# Patient Record
Sex: Female | Born: 1945
Health system: Southern US, Community
[De-identification: ages and names within clinical notes are randomized; demographics above are authoritative.]

## PROBLEM LIST (undated history)

## (undated) DIAGNOSIS — J449 Chronic obstructive pulmonary disease, unspecified: Secondary | ICD-10-CM

## (undated) DIAGNOSIS — I10 Essential (primary) hypertension: Secondary | ICD-10-CM

## (undated) DIAGNOSIS — I5189 Other ill-defined heart diseases: Secondary | ICD-10-CM

## (undated) DIAGNOSIS — E785 Hyperlipidemia, unspecified: Secondary | ICD-10-CM

## (undated) HISTORY — DX: Essential (primary) hypertension: I10

## (undated) HISTORY — DX: Other ill-defined heart diseases: I51.89

## (undated) HISTORY — PX: TUBAL LIGATION: SHX77

## (undated) HISTORY — DX: Hyperlipidemia, unspecified: E78.5

---

## 1997-05-15 ENCOUNTER — Other Ambulatory Visit: Admission: RE | Admit: 1997-05-15 | Discharge: 1997-05-15 | Payer: Self-pay | Admitting: *Deleted

## 1998-08-04 ENCOUNTER — Other Ambulatory Visit: Admission: RE | Admit: 1998-08-04 | Discharge: 1998-08-04 | Payer: Self-pay | Admitting: *Deleted

## 1998-12-01 ENCOUNTER — Other Ambulatory Visit: Admission: RE | Admit: 1998-12-01 | Discharge: 1998-12-01 | Payer: Self-pay | Admitting: *Deleted

## 1998-12-01 ENCOUNTER — Encounter (INDEPENDENT_AMBULATORY_CARE_PROVIDER_SITE_OTHER): Payer: Self-pay | Admitting: Specialist

## 1999-03-02 ENCOUNTER — Encounter: Payer: Self-pay | Admitting: *Deleted

## 1999-03-02 ENCOUNTER — Encounter: Admission: RE | Admit: 1999-03-02 | Discharge: 1999-03-02 | Payer: Self-pay | Admitting: *Deleted

## 1999-03-04 ENCOUNTER — Ambulatory Visit (HOSPITAL_BASED_OUTPATIENT_CLINIC_OR_DEPARTMENT_OTHER): Admission: RE | Admit: 1999-03-04 | Discharge: 1999-03-04 | Payer: Self-pay | Admitting: *Deleted

## 1999-03-04 ENCOUNTER — Encounter (INDEPENDENT_AMBULATORY_CARE_PROVIDER_SITE_OTHER): Payer: Self-pay | Admitting: *Deleted

## 1999-07-29 ENCOUNTER — Other Ambulatory Visit: Admission: RE | Admit: 1999-07-29 | Discharge: 1999-07-29 | Payer: Self-pay | Admitting: *Deleted

## 1999-08-03 ENCOUNTER — Encounter: Payer: Self-pay | Admitting: *Deleted

## 1999-08-03 ENCOUNTER — Encounter: Admission: RE | Admit: 1999-08-03 | Discharge: 1999-08-03 | Payer: Self-pay | Admitting: *Deleted

## 2000-02-23 ENCOUNTER — Encounter: Payer: Self-pay | Admitting: Family Medicine

## 2000-02-23 ENCOUNTER — Encounter: Admission: RE | Admit: 2000-02-23 | Discharge: 2000-02-23 | Payer: Self-pay | Admitting: Family Medicine

## 2000-10-13 ENCOUNTER — Other Ambulatory Visit: Admission: RE | Admit: 2000-10-13 | Discharge: 2000-10-13 | Payer: Self-pay | Admitting: *Deleted

## 2002-05-30 ENCOUNTER — Other Ambulatory Visit: Admission: RE | Admit: 2002-05-30 | Discharge: 2002-05-30 | Payer: Self-pay | Admitting: *Deleted

## 2005-11-17 ENCOUNTER — Encounter: Admission: RE | Admit: 2005-11-17 | Discharge: 2005-11-17 | Payer: Self-pay | Admitting: Orthopaedic Surgery

## 2005-12-16 ENCOUNTER — Encounter: Admission: RE | Admit: 2005-12-16 | Discharge: 2005-12-16 | Payer: Self-pay | Admitting: Orthopaedic Surgery

## 2006-02-15 ENCOUNTER — Encounter: Admission: RE | Admit: 2006-02-15 | Discharge: 2006-02-15 | Payer: Self-pay | Admitting: Orthopaedic Surgery

## 2006-05-18 ENCOUNTER — Encounter: Admission: RE | Admit: 2006-05-18 | Discharge: 2006-05-18 | Payer: Self-pay | Admitting: Orthopaedic Surgery

## 2007-10-30 ENCOUNTER — Emergency Department (HOSPITAL_COMMUNITY): Admission: EM | Admit: 2007-10-30 | Discharge: 2007-10-30 | Payer: Self-pay | Admitting: Emergency Medicine

## 2007-11-12 ENCOUNTER — Ambulatory Visit: Payer: Self-pay | Admitting: Internal Medicine

## 2007-11-12 ENCOUNTER — Inpatient Hospital Stay (HOSPITAL_COMMUNITY): Admission: EM | Admit: 2007-11-12 | Discharge: 2007-11-15 | Payer: Self-pay | Admitting: Emergency Medicine

## 2007-11-13 ENCOUNTER — Encounter (INDEPENDENT_AMBULATORY_CARE_PROVIDER_SITE_OTHER): Payer: Self-pay | Admitting: Internal Medicine

## 2007-12-12 ENCOUNTER — Ambulatory Visit: Payer: Self-pay | Admitting: Pulmonary Disease

## 2007-12-12 ENCOUNTER — Inpatient Hospital Stay (HOSPITAL_COMMUNITY): Admission: EM | Admit: 2007-12-12 | Discharge: 2007-12-14 | Payer: Self-pay | Admitting: Emergency Medicine

## 2008-01-31 ENCOUNTER — Ambulatory Visit: Payer: Self-pay | Admitting: Pulmonary Disease

## 2008-01-31 DIAGNOSIS — E785 Hyperlipidemia, unspecified: Secondary | ICD-10-CM | POA: Insufficient documentation

## 2008-01-31 DIAGNOSIS — I10 Essential (primary) hypertension: Secondary | ICD-10-CM | POA: Insufficient documentation

## 2008-01-31 DIAGNOSIS — J9610 Chronic respiratory failure, unspecified whether with hypoxia or hypercapnia: Secondary | ICD-10-CM | POA: Insufficient documentation

## 2008-02-01 ENCOUNTER — Encounter: Payer: Self-pay | Admitting: Pulmonary Disease

## 2008-02-02 ENCOUNTER — Ambulatory Visit: Payer: Self-pay | Admitting: Pulmonary Disease

## 2009-11-04 ENCOUNTER — Ambulatory Visit (HOSPITAL_COMMUNITY)
Admission: RE | Admit: 2009-11-04 | Discharge: 2009-11-04 | Payer: Self-pay | Source: Home / Self Care | Admitting: Family Medicine

## 2010-01-25 ENCOUNTER — Encounter: Payer: Self-pay | Admitting: Orthopaedic Surgery

## 2010-03-18 LAB — BLOOD GAS, ARTERIAL
Drawn by: 20517
FIO2: 0.21 %
O2 Saturation: 93.7 %
Patient temperature: 98.6
TCO2: 31.2 mmol/L (ref 0–100)
pCO2 arterial: 43.9 mmHg (ref 35.0–45.0)

## 2010-04-20 ENCOUNTER — Other Ambulatory Visit: Payer: Self-pay | Admitting: Family Medicine

## 2010-04-20 DIAGNOSIS — Z1231 Encounter for screening mammogram for malignant neoplasm of breast: Secondary | ICD-10-CM

## 2010-04-28 ENCOUNTER — Ambulatory Visit
Admission: RE | Admit: 2010-04-28 | Discharge: 2010-04-28 | Disposition: A | Discharge status: Home / Self Care | Payer: Commercial Indemnity | Source: Ambulatory Visit | Attending: Family Medicine | Admitting: Family Medicine

## 2010-04-28 DIAGNOSIS — Z1231 Encounter for screening mammogram for malignant neoplasm of breast: Secondary | ICD-10-CM

## 2010-05-11 ENCOUNTER — Other Ambulatory Visit: Payer: Self-pay | Admitting: Obstetrics and Gynecology

## 2010-05-11 ENCOUNTER — Other Ambulatory Visit (HOSPITAL_COMMUNITY)
Admission: RE | Admit: 2010-05-11 | Discharge: 2010-05-11 | Disposition: A | Discharge status: Home / Self Care | Payer: Commercial Indemnity | Source: Ambulatory Visit | Attending: Obstetrics and Gynecology | Admitting: Obstetrics and Gynecology

## 2010-05-11 DIAGNOSIS — Z01419 Encounter for gynecological examination (general) (routine) without abnormal findings: Secondary | ICD-10-CM | POA: Insufficient documentation

## 2010-05-19 NOTE — Consult Note (Signed)
NAME:  [name], [name] NO.:  [id number]   MEDICAL RECORD NO.:  [id number]          PATIENT TYPE:  INP   LOCATION:  3021                         FACILITY:  [facility]   PHYSICIAN:  Coralyn Helling, MD        DATE OF BIRTH:  March 07, 1945   DATE OF CONSULTATION:  12/12/2007  DATE OF DISCHARGE:                                 CONSULTATION   REFERRING PHYSICIAN:  Ramiro Harvest, MD   REASON FOR CONSULTATION:  COPD.   [name] is a 65 year old female who was admitted on December 12, 2007, with acute on chronic hypoxic and hypercapnic respiratory failure.  She was initially admitted in November 2009 for dyspnea and at that time  was diagnosed with COPD.  From what I can gather I believe this was made  on review of her symptoms, physical findings, and radiographic findings.  I do not think she has had pulmonary function tests performed.  She has  an extensive history of smoking and continued to smoke up to the day of  her admission.  She says that on December 11, 2007, she suddenly started  getting short of breath with difficulty getting air in and out.  She,  however, denied having any problems much as far as coughing, sputum  production, or hemoptysis.  She denies any allergy symptoms or sinus  congestion.  She also denies any problems with sore throat or  hoarseness.  She has not had any fevers, chills, or sweats.  She has not  had any recent sick exposures or travel history.  She denies any  significant occupational exposures or exposures to animals or birds.  She was given several nebulizer treatments in the emergency room, but  began to have difficulties with dyspnea and as a result was admitted to  the Intensive Care Unit.  However, she did not require noninvasive  ventilation or intubation.  She says that she gets short of breath if  she walks at a brisk pace, but if she goes at a steady pace she does not  feel that her breathing causes too much difficulty.   Her past  medical history significant for:  1. Presumptive COPD.  2. Diabetes.  3. Hypertension.  4. Dyslipidemia.  5. Gastroesophageal reflux disease.  6. Depression.  7. Chronic hypoxic respiratory failure.  8. Chronic hypercapnic respiratory failure.  9. Spinal stenosis.  10.Diastolic heart failure with echocardiogram from November 13, 2007,      showed an ejection fraction of 60-65% with mild diastolic      dysfunction.  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  12.Tubal ligation.   Her listed allergies are to DEMEROL, KEFLEX, and ZITHROMAX.   FAMILY HISTORY:  Noncontributory.   SOCIAL HISTORY:  The patient is currently unemployed.  She has 2-3  drinks per day.  She smoked 1-1/2 packs of cigarettes per day up until  the day of admission.  She says she started smoking at the age of 82.   Her outpatient medications include:  1. Lipitor 10 mg daily.  2. Paxil 30 mg daily.  3. Prilosec 20 mg  daily.  4. Valium 10 mg as needed.  5. Albuterol as needed.  6. Atrovent as needed.  7. Byetta 5 mcg b.i.d.  8. Prinzide 20/12.5 mg daily.  9. Norvasc 5 mg daily.   PHYSICAL EXAMINATION:  GENERAL:  She is seen in the Intensive Care Unit.  She is awake and alert, does not appear to be in acute distress and is  resting comfortably.  VITAL SIGNS:  Temperature is 97.6, blood pressure is 143/52, respiratory  rate is 26, oxygen saturation 92% on 3 liters, and heart rate is 84 and  regular.  HEENT:  Pupils are reactive.  Extraocular muscles are intact.  There is  no sinus tenderness.  No nasal discharge.  No oral lesions.  She has  Mallampati III airway with a large tongue.  There is no lymphadenopathy.  No jugular venous distention.  No thyromegaly.  HEART:  S1 and S2.  Regular rate and rhythm without murmur.  CHEST:  She has decreased breath sounds and prolonged expiratory phase,  but there was no wheezing or rales.  ABDOMEN:  Obese, soft, and nontender.  Positive bowel  sounds.  No  organomegaly.  EXTREMITIES:  There is no edema, sinus, clubbing.  Peripheral pulses are  palpable and symmetric.  NEUROLOGIC:  She has normal strength with no focal deficits appreciated.   Arterial blood gas showed a pH of 7.38, pCO2 of 57, and pO2 of 65.  WBC  6.6, hemoglobin 16.2, hematocrit is 45.9, and platelet count is 181.  Sodium is 135, potassium is 3.7, chloride is 91, CO2 is 34, BUN is 5,  creatinine is 0.6, and glucose is 349.  Chest x-ray shows chronic  bronchitic changes with left basilar atelectasis.   IMPRESSION:  1. Acute on chronic hypoxic and hypercapnic respiratory failure,      likely in the basis of chronic obstructive pulmonary disease.  I      have strongly emphasized to [name] the importance of complete      smoking abstinence and she understands this.  She appears to have      improved considerably compared to how she was at the time of      admission.  I think it would be safe to transfer her out of the      Intensive Care Unit.  I will adjust her inhaler regimen.  I will      discontinue her Advair and Atrovent.  I will start her on Symbicort      160/4.5 2 puffs b.i.d. and Spiriva 1 puff daily.  She could      continue to use Ventolin on an as-needed basis.  In addition, I      will change her from Solu-Medrol to prednisone 40 mg daily and then      would recommend tapering this off over the next 5-7 days.  She is      to continue on supplemental oxygen to keep her oxygen saturation      greater than 90%.  I do not think she needs further followup      arterial blood gas unless her clinical status deteriorates.  When      she is stable, she will need to have pulmonary function tests      arranged on an outpatient basis to further assess the possibility      of chronic obstructive pulmonary disease.  She may also benefit on      an outpatient basis from Pulmonary Rehab.  In addition, she will      need to have influenza and pneumococcal  vaccination provided if she      has not had this done recently.  Additionally, she may need to have      further evaluation with an overnight polysomnogram to exclude the      possibility of sleep apnea and obesity-hyperventilation given her      history of diabetes, hypertension, and obesity as well as history      of snoring  2. History of vocal cord hyperkeratosis.  She has not seen ENT since      approximately 2001.  I will defer to her primary care team whether      reevaluation is necessary, although I do not think that this is an      acute issue at the      present time.  Of note that she is on angiotensin-converting enzyme      inhibitor and if she does have more difficulties with her upper      airway and vocal cords, this may need to be changed to an      alternative antihypertensive agent.      Coralyn Helling, MD  Electronically Signed     VS/MEDQ  D:  12/12/2007  T:  12/12/2007  Job:  [id number]

## 2010-05-19 NOTE — Discharge Summary (Signed)
NAMEMarland Kitchen  Lane, [name] NO.:  [id number]   MEDICAL RECORD NO.:  [id number]          PATIENT TYPE:  INP   LOCATION:  3734                         FAC[name]   PHYSICIAN:  [name], M.D.  DATE OF BIRTH:  12-06-1945   DATE OF ADMISSION:  11/12/2007  DATE OF DISCHARGE:  11/15/2007                               DISCHARGE SUMMARY   DISCHARGE DIAGNOSES:  1. Chronic obstructive pulmonary disease exacerbation.  2. Diabetes type 2.  3. Hypertension.  4. Hyperlipidemia.  5. Gastroesophageal reflux disease.  6. Depression.   DISCHARGE MEDICATIONS:  1. Byetta 5 mcg subcutaneously b.i.d.  2. Lipitor 10 mg 1 tablet p.o. daily.  3. Paxil 30 mg 1 tablet p.o. daily.  4. Prilosec 20 mg 1 tablet p.o. daily.  5. Prinzide 20/12.5 mg 1 tablet daily.  6. Valium 10 mg 1 tablet p.o. q.12 h. as needed for anxiety.  7. Janumet 50/100 mg 1 tablet p.o. b.i.d.  8. Norvasc 5 mg 1 tablet p.o. daily.  9. Albuterol 90 mcg spray MDI 2 puffs inhaled q.4-6 h. as needed.  10.Atrovent HFA 70 mcg spray MDI 2 puffs inhaled q.i.d.  11.Prednisone 10 mg 6 tablets p.o. for 2 days, then 5 tablets p.o. for      2 days, then 4 tablets p.o. for 2 days, then 3 tablets p.o. for 2      days, then 2 tablets p.o. for 2 days, then 1 tablet p.o. for 2      days, then half a tablet p.o. for 2 days, and stop the medication.   CONDITION ON DISCHARGE:  The patient responded well to treatment.  The  patient was set up with home oxygen.  The patient will follow up with  [name], which is a nurse practitioner that she sees at [name] on November 22, 2007, at 10:30 a.m.   PROCEDURES:  1. Chest x-ray on November 12, 2007.  Impression:  Stable chronic      obstructive pulmonary disease.  2. Chest x-ray on November 13, 2007.  Impression:  No active disease.      Suspect chronic obstructive pulmonary disease.  3. 2-D echo was done while the patient was hospitalized.   Impression      showed mild diastolic dysfunction.   ADMITTING HISTORY AND PHYSICAL/HISTORY OF PRESENT ILLNESS:  A 65-year-  old female with past medical history of COPD, hypertension, and diabetes  type 2 that presented with chief complaint of shortness of breath.  The  patient woke up at 4:30 a.m. because of shortness of breath and called  the EMS.  The patient stated she had not been feeling short of breath  during the previous day of admission.  The patient states that the  Monday prior to admission she went to her primary care [name] and due  to low oxygen saturations in the office was told to go to the emergency  room.  The patient got treated at [name] and was recommended to be  hospitalized, but the patient refused.  Oxygen saturation upon EMS  arrival the morning of admission  was 89% on room air.  The patient  reports that she had these symptoms before, but usually does not like to  stay in the hospital for treatment.  The patient denies fevers, chills,  cough, chest pain, palpitations, diaphoresis, night sweats, nausea, or  vomiting.   PHYSICAL EXAMINATION:  VITAL SIGNS:  Temperature 98.2, blood pressure  119/68, pulse 120, respiratory 42, and oxygen saturation 96% on 7 L.  GENERAL:  NAD.  EYES:  EOMI, anicteric.  ENT:  Mild mucous membranes, no exudates.  NECK:  Supple.  No thyromegaly, no masses, no JVD.  RESPIRATORY:  Diffuse expiratory wheezes.  No rhonchi.  No crackles.  CARDIOVASCULAR:  Regular rate and rhythm.  No murmurs, rubs, or gallops.  GASTROINTESTINAL:  Bowel sounds positive, soft, depressible, nontender,  and nondistended.  No guarding.  No rebound.  EXTREMITIES:  No edema.  Onychomycosis.  SKIN:  Anterior left foot dark scaly skin changes.  LYMPH:  No lymphadenopathy.  MUSCULOSKELETAL:  Moving all extremities equally.  NEUROLOGIC:  A and O x3.  Nerves II-XII intact.  No light touch  sensation.   ADMISSION LABORATORY DATA:  White blood cells 10.6,  hemoglobin 18,  hematocrit 52.6, and platelets 185.  Sodium 134, potassium 4.1, chloride  99, bicarbonate 28, BUN 4, creatinine 0.7, and glucose 190.   HOSPITAL COURSE:  1. COPD exacerbation.  Etiology of exacerbation was most likely due to      chronic smoking history.  Ruled out any possible infections.  Chest      x-ray did not show any infiltrates or consolidation, the patient      had a normal white blood cell count.  Blood cultures were negative.      Urine Legionella was negative.  Urinalysis was normal.  ACS was      also ruled out with EKGs which did not show any signs of myocardial      infarction or ischemia, cardiac enzymes were negative.  The patient      was placed on telemetry to monitor her for arrhythmias.  The      patient was placed on appropriate bronchodilators and IV steroids.      Home Health was consulted for oxygen at home.  The patient was      eventually changed on hospital day #2 from IV steroids to oral      steroids.  The patient responded well to treatment.  On the day of      discharge, the patient looked clinically stable and was ready to go      home.  The patient will go home on oxygen.  The patient will also      continue her albuterol and ipratropium home medication.  The      patient will also continue prednisone, but she will be tapered over      2 weeks.  The patient will see primary care [name] in 1 week to      follow up on the clinical status.  Social worker was consulted for      proper smoking cessation.  The patient was given various options      for support groups for smoking cessation.  It was stressed while at      hospital stay that smoking was detrimental for her health.  2. Hypertension.  Hypertensive medication was held on admission.      Blood pressure was stable throughout hospitalization.  On      discharge, the patient will continue  with home medication.  We will      have the patient follow up with primary care [name] for  further      management of hypertension.  3. Diabetes type 2.  The patient was started on sliding scale insulin,      metformin, and Januvia while she was in the hospital.  There were      some instances that blood glucose levels were somewhat elevated,      which was most likely due to prednisone that she was taking for her      COPD exacerbation.  Appropriate titration of medication was given      during her hospital stay and blood glucose levels were controlled      adequately.  Upon discharge, the patient will continue with home      medication.  Primary care Karelly Dewalt appointment will be done and      further management and titration of medication may be indicated if      the primary care physician believes it to be so.  4. Hyperlipidemia.  The patient was placed on home medication.      Problem was stable throughout hospitalization.  The patient will be      discharged on home medication.  5. Depression.  The patient was continued with home medication while      in hospital.  Social worker consult was done for support due to      grieving that the patient may be experiencing because of recent      death of husband.  The patient was receptive to support groups and      told social worker that she would get in contact with social groups      if she felt that she needed to do so.  The patient will continue      with home medications upon discharge.  6. Hyponatremia.  Most likely, this problem was due to      pseudohyponatremia secondary to hyperglycemia.  Once hyperglycemia      was corrected, hyponatremia resolved.  Continue to provide      appropriate IV fluids while the patient was in the hospital and      appropriate control of blood glucose level.  7. Polycythemia, most likely secondary to tobacco abuse.  Appropriate      counseling was made during hospitalization for cessation of      smoking.   DISCHARGE LABORATORY DATA:  White blood cells 8.6, hemoglobin 16.3,  hematocrit  47.9, and platelets 172.  Sodium 134, potassium 4.1, chloride  94, bicarbonate 33, BUN 12, creatinine 0.58, and glucose 128.  Blood  cultures x2 negative.   DISCHARGED VITAL SIGNS:  Temperature 97.9, blood pressure 143/78, pulse  75, respiratory rate 80, and oxygen saturation 94 on 2 liters.      Danne Harbor, MD  Electronically Signed      [name], M.D.  Electronically Signed    RV/MEDQ  D:  11/15/2007  T:  11/16/2007  Job:  [id number]

## 2010-05-19 NOTE — H&P (Signed)
NAME:  [name], [name] NO.:  [id number]   MEDICAL RECORD NO.:  [id number]          PATIENT TYPE:  INP   LOCATION:  2108                         FACILITY:  [facility]   PHYSICIAN:  Michiel Cowboy, MDDATE OF BIRTH:  [birth year]   DATE OF ADMISSION:  12/11/2007  DATE OF DISCHARGE:                              HISTORY & PHYSICAL   PRIMARY CARE Marc Leichter:  Dr. Diana Eves with [facility] at [facility].   CHIEF COMPLAINT:  Shortness of breath.   HISTORY OF THE PRESENT ILLNESS:  The patient is a 65 year old female  with a history of severe anxiety, asthma, COPD, diabetes and  hypercholesterolemia who was  admitted just one month ago for COPD  exacerbation.  Since her discharge she has continued to smoke more than  a pack a day and today developed severe shortness of breath.  They  brought her back to the emergency department.  She was receiving  continuous nebulizer treatments and her shortness of breath somewhat  improved, but she is still somewhat wheezy at which point [facility] is called for further evaluation and admission.   REVIEW OF SYSTEMS:  No fever.  No chills.  No nausea.  No vomiting.  No  constipation.  She has an occasional cough that is nonproductive.  She  continues to smoke.  Otherwise the review of systems is unremarkable.   FAMILY HISTORY:  The family history is noncontributory.   SOCIAL HISTORY:  The patient smokes a pack a day.  She has been also  drinking about 3 drinks per day or so for the past 30 years maybe, but  does not have any history of DTs or withdrawal.   ALLERGIES:  THE PATIENT HAS ALLERGIES TO DEMEROL, KEFLEX AND ZITHROMAX.   MEDICATIONS:  1. Lipitor 10 mg daily.  2. Paxil 30 mg daily.  3. Prilosec 20 mg daily.  4. Valium 10 mg as needed; the patient states she takes this only      about once a month or so.  5. Albuterol as needed.  6. Atrovent as needed.  7. Biata 5 mcg twice a day.  8. Prinzide 20/1.5 mg once a  day.  9. __________ 50 mg twice a day.  10.Norvasc 5 mg once a day.   PHYSICAL EXAMINATION:  VITAL SIGNS:  Temperature 97.9, blood pressure  173/56, pulse 115, respirations 26, and satting at 96% on 1-3 liters and  81% on room air.  GENERAL APPEARANCE:  The patient appears to be in no acute distress  currently and is actually able to speak in full sentences.  HEENT:  Head is atraumatic.  Somewhat dry mucous membranes.  LUNGS:  The lungs have extensive wheezing and poor air movement  bilaterally.  HEART:  The heart is rapid, but regular.  No murmurs, rubs or gallops.  ABDOMEN:  The abdomen is soft, nontender, obese and nondistended.  EXTREMITIES:  The lower extremities are without clubbing, cyanosis or  edema.  NEUROLOGIC EXAMINATION:  Cranial nerves II-XII are intact.  Strength is  5/5.   LABORATORY DATA:  White blood cell count 7.5 and hemoglobin 15.7.  Sodium  134, potassium 3.8 and creatinine 0.5.  Chest x-ray showed left  base atelectasis and was otherwise unremarkable.  EKG showed sinus  tachycardia at a rate of 107 and no evidence of ischemia or changes.  ABGs;  7.353, 69.8 and 118   ASSESSMENT AND PLAN:  This is a 65 year old female with history of  chronic obstructive pulmonary disease and maybe asthma with continuous  of tobacco abuse who comes in with severe shortness of breath.   1. Likely chronic obstructive pulmonary disease exacerbation.  Given      that the patient still requires continuous nebulizers we will admit      her to a stepdown unit.  We will do frequent nebulizer treatments      with DuoNeb, schedule Atrovent and we will give her Advair.  Given      that she does not have any evidence of pneumonia; however, we will      treat her for chronic obstructive pulmonary disease with Augmentin      and high-dose steroids.  Given that I am not sure she does not have      a history of asthma actually I would recommend a pulmonary consult.      Currently she appears  to be compensated from a respiratory acidosis      standpoint and we will continue to monitor her.  We may need repeat      the arterial blood gases in the morning.  We will do a D-dimer to      be on the safe side.   1. Diabetes, poorly controlled.  We will continue Januvia and Biata.   1. Hypertension.  We will continue Norvasc.   1. Prophylaxes. Protonix and Lovenox.    1. History of alcohol abuse without history of withdrawal.  We will      avoid over sedation, but we will give her Xanax as needed for signs      of withdrawal.  We may need to increase this if there is more      evidence of aggressive withdrawal.      Michiel Cowboy, MD  Electronically Signed     AVD/MEDQ  D:  12/12/2007  T:  12/12/2007  Job:  [id number]   cc:   Dr. Diana Eves

## 2010-05-19 NOTE — Discharge Summary (Signed)
NAME:  [name], [name]           ACCOUNT NO.:  [id number]   MEDICAL RECORD NO.:  [id number]          PATIENT TYPE:  INP   LOCATION:  3021                         FACILITY:  [facility]   PHYSICIAN:  [name], MDDATE OF BIRTH:  24-May-1945   DATE OF ADMISSION:  12/11/2007  DATE OF DISCHARGE:  12/14/2007                               DISCHARGE SUMMARY   DISCHARGE DIAGNOSES:  1. Chronic obstructive pulmonary disease exacerbation.  2. Continued tobacco abuse, counseled against.  3. Uncontrolled type 2 diabetes.  4. Depression.  5. Hyperlipidemia.  6. History of vocal cord hyperkeratosis.  7. Chronic hypoxic, hypercapnic respiratory failure.  8. History of diastolic dysfunction.   DISCHARGE MEDICATIONS:  1. She is to increase her Janumet to 2 tablets twice daily, then fill      prescriptions for 50/1000 mg twice a day.  2. Prednisone taper as directed.  3. Ceftin 500 mg b.i.d. until gone.  4. Symbicort 1 puff twice daily until gone.  5. She may continue Lipitor 10 mg a day.  6. Paxil 30 mg a day.  7. Prilosec 20 mg a day.  8. Albuterol and Atrovent MDI as needed.  9. Byetta 5 mg subcutaneously twice a day.  10.Prinzide 20/12.5 mg daily.  11.Norvasc 5 mg a day.  12.Oxygen 2 L.   ACTIVITY:  Increase slowly.   Follow up with [name] in 1-2 weeks, at which time any  adjustments can be made in her diabetes regimen and continued to  encourage cessation of tobacco abuse.   Diet should be diabetic.   CONDITION:  Stable.   CONSULTATION:  Tammy Helling, MD.   PROCEDURES:  None.   LABORATORY DATA:  ABG on 4 L nasal cannula showed a pH of 7.381, pCO2  57, pO2 64, bicarbonate 33, base excess 8, oxygen saturation 92%.  CBC  was unremarkable.  Her glucose was 301, bicarbonate was 35.  The rest of  her CMET was essentially unremarkable.  Her hemoglobin A1c was 8.7.  Point-of-care enzymes negative.   SPECIAL STUDIES/RADIOLOGY:  EKG showed sinus tachycardia with a rate  of  107, low voltage, cannot rule out anterior infarct, age indeterminate.  Chest x-ray showed left basilar atelectasis.   HISTORY AND HOSPITAL COURSE:  Ms. [name] is a 65 year old white female  smoker who presented with shortness of breath.  See H and P for complete  details.  She was admitted a month ago and started on home oxygen.  Since then, she has continued to smoke a pack of cigarettes a day.  She  lives alone since the death of her husband earlier this year.  Her  brother assists with shopping, etc.  The patient was felt to have acute-  on-chronic hypercarbic hypoxic respiratory failure.  She had no cough or  fever.  She was tachycardiac, respiratory rate 26, blood pressure  173/56, oxygen saturation 96% on oxygen.  She had a poor air movement  and extensive wheezing.  Dry mucous membranes.  She was started on  steroids, antibiotics, nebulizers.  Pulmonary was consulted.  At the  time of discharge, the patient is reportedly at her  baseline.  She has  no wheezes currently, but she is unable to walk very far without having  to stop due to shortness of breath.  According to her, this is at  baseline.  Her brother voices concern about her ability to live alone.  I discussed this with her and she refuses placement, she refuses a home  nurse, home physical therapist, etc.  I have encouraged her to quit  smoking.   Her blood sugars were elevated on admission, and her hemoglobin A1c was  elevated.  She believes that her Janumet dose is 50/500.  I have  instructed her to take 2 tablets twice a day and then fill the new  prescription for a higher dose.  Other medical problems remained stable  during her hospitalization.   Total time on the day of discharge is greater than 30 minutes.      Tammy L. Lendell Caprice, MD  Electronically Signed     CLS/MEDQ  D:  12/14/2007  T:  12/14/2007  Job:  [id number]   cc:   [name], CRNP

## 2010-05-20 ENCOUNTER — Encounter: Payer: Self-pay | Admitting: *Deleted

## 2010-05-20 ENCOUNTER — Encounter: Payer: Self-pay | Admitting: Internal Medicine

## 2010-05-22 NOTE — Op Note (Signed)
[facility]  Patient:    [name], [name]                  MRN: [id number] Proc. Date: 03/04/99 Adm. Date:  [id number] Attending:  Aundria Mems                           Operative Report  PREOPERATIVE DIAGNOSIS:  Bilateral vocal cord leukoplakic lesions, rule out carcinoma in situ versus early invasive carcinoma.  POSTOPERATIVE DIAGNOSIS:  Pending histological evaluation.  OPERATIVE PROCEDURE:  Direct microlaryngoscopy and bilateral vocal cord biopsy/stripping.  SURGEON:  Kathy Breach, M.D.  DESCRIPTION OF PROCEDURE:  With patient under general orotracheal anesthesia, laryngoscope was introduced.  The patient had a thick short neck with anterior larynx and the usual operating laryngoscope was not able to expose the interior of the larynx adequately; therefore, I switched to the anterior commissure scope.  Both valleculae were clear.  Both piriform sinuses were clear.  The postcricoid  area was normal to inspection.  Epiglottic mucosal surfaces, lingual and glottic, were clear.  Interior of the larynx revealed bilateral thick whitish mucosal changes involving the mid-to-anterior third of vocal cords bilaterally, left side a little bit more markedly changed than the right with a small 1 x 2-cm central ulcerative area.  Under visualization with the operating microscope, the leukoplakic changes were stripped away piecemeal on the left side, suggesting more advanced disease than more benign, which would strip away more cleanly.  A similar procedure was done on the right side, submitting separate biopsy and stripping specimens for histologic evaluation.  Minimal bleeding was dried with topical Adrenalin cottonoid.  Photos taken throughout the procedure for documentation.  Patient tolerated the procedure well and was taken to the recovery room in stable general condition. DD:  03/04/99 TD:  03/04/99 Job: [id number]

## 2010-05-26 ENCOUNTER — Institutional Professional Consult (permissible substitution): Payer: Commercial Indemnity | Admitting: Emergency Medicine

## 2010-05-26 ENCOUNTER — Ambulatory Visit: Payer: Commercial Indemnity | Admitting: Pulmonary Disease

## 2010-10-06 LAB — DIFFERENTIAL
Eosinophils Absolute: 0.1
Lymphs Abs: 0.6 — ABNORMAL LOW
Monocytes Relative: 1 — ABNORMAL LOW

## 2010-10-06 LAB — CBC
MCHC: 33.7
MCV: 88.8
RDW: 21.2 — ABNORMAL HIGH

## 2010-10-07 LAB — CULTURE, BLOOD (ROUTINE X 2): Culture: NO GROWTH

## 2010-10-07 LAB — CBC
HCT: 47.1 — ABNORMAL HIGH
HCT: 49.6 — ABNORMAL HIGH
Hemoglobin: 16.2 — ABNORMAL HIGH
Hemoglobin: 18 — ABNORMAL HIGH
MCHC: 34.2
MCHC: 34.4
MCV: 90.7
MCV: 90.7
MCV: 91.5
MCV: 92.5
Platelets: 185
Platelets: 191
RBC: 5.19 — ABNORMAL HIGH
RBC: 5.23 — ABNORMAL HIGH
RBC: 5.36 — ABNORMAL HIGH
RBC: 5.8 — ABNORMAL HIGH
WBC: 11.5 — ABNORMAL HIGH
WBC: 8.6

## 2010-10-07 LAB — CARDIAC PANEL(CRET KIN+CKTOT+MB+TROPI)
CK, MB: 1.7
CK, MB: 1.7
Relative Index: INVALID
Total CK: 32
Troponin I: <0.01

## 2010-10-07 LAB — BASIC METABOLIC PANEL
CO2: 32
Chloride: 90 — ABNORMAL LOW
Chloride: 94 — ABNORMAL LOW
Chloride: 95 — ABNORMAL LOW
Creatinine, Ser: 0.58
Creatinine, Ser: 0.7
GFR calc Af Amer: >60
GFR calc Af Amer: >60
GFR calc Af Amer: >60
Potassium: 4.2
Sodium: 126 — ABNORMAL LOW
Sodium: 133 — ABNORMAL LOW
Sodium: 134 — ABNORMAL LOW

## 2010-10-07 LAB — BENZODIAZEPINE, QUANTITATIVE, URINE
Alprazolam (GC/LC/MS), ur confirm: NEGATIVE
Oxazepam GC/MS Conf: NEGATIVE

## 2010-10-07 LAB — DIFFERENTIAL
Basophils Relative: 0
Lymphs Abs: 1.8
Monocytes Relative: 7

## 2010-10-07 LAB — COMPREHENSIVE METABOLIC PANEL
AST: 11
Albumin: 3.5
Alkaline Phosphatase: 69
BUN: 14
CO2: 32
Chloride: 93 — ABNORMAL LOW
GFR calc non Af Amer: >60
Potassium: 4.8
Total Bilirubin: 0.8

## 2010-10-07 LAB — DRUGS OF ABUSE SCREEN W/O ALC, ROUTINE URINE
Amphetamine Screen, Ur: NEGATIVE
Barbiturate Quant, Ur: NEGATIVE
Benzodiazepines.: POSITIVE — AB
Cocaine Metabolites: NEGATIVE
Marijuana Metabolite: NEGATIVE

## 2010-10-07 LAB — TROPONIN I: Troponin I: 0.01

## 2010-10-07 LAB — GLUCOSE, CAPILLARY
Glucose-Capillary: 134 — ABNORMAL HIGH
Glucose-Capillary: 164 — ABNORMAL HIGH
Glucose-Capillary: 230 — ABNORMAL HIGH
Glucose-Capillary: 249 — ABNORMAL HIGH
Glucose-Capillary: 289 — ABNORMAL HIGH
Glucose-Capillary: 316 — ABNORMAL HIGH
Glucose-Capillary: 316 — ABNORMAL HIGH
Glucose-Capillary: 328 — ABNORMAL HIGH
Glucose-Capillary: 361 — ABNORMAL HIGH
Glucose-Capillary: 372 — ABNORMAL HIGH

## 2010-10-07 LAB — BLOOD GAS, ARTERIAL
Acid-Base Excess: 4.9 — ABNORMAL HIGH
Bicarbonate: 29.9 — ABNORMAL HIGH
TCO2: 31.5
pCO2 arterial: 52.9 — ABNORMAL HIGH
pH, Arterial: 7.37
pO2, Arterial: 81.2

## 2010-10-07 LAB — POCT I-STAT, CHEM 8
Creatinine, Ser: 0.7
Glucose, Bld: 190 — ABNORMAL HIGH
HCT: 56 — ABNORMAL HIGH
Potassium: 4.1
Sodium: 134 — ABNORMAL LOW
TCO2: 28

## 2010-10-07 LAB — VITAMIN B12: Vitamin B-12: 550 (ref 211–911)

## 2010-10-07 LAB — CK TOTAL AND CKMB (NOT AT ARMC)
CK, MB: 1.9
Total CK: 38

## 2010-10-07 LAB — HEMOGLOBIN A1C: Mean Plasma Glucose: 169

## 2010-10-09 LAB — CBC
HCT: 44 % (ref 36.0–46.0)
HCT: 47.7 % — ABNORMAL HIGH (ref 36.0–46.0)
Hemoglobin: 14.8 g/dL (ref 12.0–15.0)
Hemoglobin: 15.3 g/dL — ABNORMAL HIGH (ref 12.0–15.0)
Hemoglobin: 15.9 g/dL — ABNORMAL HIGH (ref 12.0–15.0)
MCHC: 33.4 g/dL (ref 30.0–36.0)
MCV: 96 fL (ref 78.0–100.0)
RBC: 4.54 MIL/uL (ref 3.87–5.11)
RDW: 14.1 % (ref 11.5–15.5)
WBC: 11 10*3/uL — ABNORMAL HIGH (ref 4.0–10.5)
WBC: 7.5 10*3/uL (ref 4.0–10.5)

## 2010-10-09 LAB — GLUCOSE, CAPILLARY
Glucose-Capillary: 199 mg/dL — ABNORMAL HIGH (ref 70–99)
Glucose-Capillary: 201 mg/dL — ABNORMAL HIGH (ref 70–99)
Glucose-Capillary: 207 mg/dL — ABNORMAL HIGH (ref 70–99)
Glucose-Capillary: 224 mg/dL — ABNORMAL HIGH (ref 70–99)
Glucose-Capillary: 253 mg/dL — ABNORMAL HIGH (ref 70–99)
Glucose-Capillary: 367 mg/dL — ABNORMAL HIGH (ref 70–99)

## 2010-10-09 LAB — DIFFERENTIAL
Eosinophils Relative: 0 % (ref 0–5)
Eosinophils Relative: 4 % (ref 0–5)
Lymphocytes Relative: 11 % — ABNORMAL LOW (ref 12–46)
Lymphocytes Relative: 16 % (ref 12–46)
Lymphs Abs: 1.2 10*3/uL (ref 0.7–4.0)
Lymphs Abs: 1.2 10*3/uL (ref 0.7–4.0)
Monocytes Absolute: 0.7 10*3/uL (ref 0.1–1.0)
Monocytes Absolute: 1 10*3/uL (ref 0.1–1.0)
Monocytes Relative: 9 % (ref 3–12)
Monocytes Relative: 9 % (ref 3–12)
Neutro Abs: 8.8 10*3/uL — ABNORMAL HIGH (ref 1.7–7.7)

## 2010-10-09 LAB — POCT I-STAT 3, ART BLOOD GAS (G3+)
Bicarbonate: 38.8 mEq/L — ABNORMAL HIGH (ref 20.0–24.0)
TCO2: 41 mmol/L (ref 0–100)
pCO2 arterial: 69.8 mmHg (ref 35.0–45.0)
pH, Arterial: 7.353 (ref 7.350–7.400)

## 2010-10-09 LAB — PROTIME-INR
INR: 1 (ref 0.00–1.49)
Prothrombin Time: 13 seconds (ref 11.6–15.2)

## 2010-10-09 LAB — BLOOD GAS, ARTERIAL
Acid-Base Excess: 8.1 mmol/L — ABNORMAL HIGH (ref 0.0–2.0)
Bicarbonate: 33.2 mEq/L — ABNORMAL HIGH (ref 20.0–24.0)
O2 Saturation: 92.2 %
Patient temperature: 98.6
TCO2: 35 mmol/L (ref 0–100)
pO2, Arterial: 64.7 mmHg — ABNORMAL LOW (ref 80.0–100.0)

## 2010-10-09 LAB — BASIC METABOLIC PANEL
GFR calc Af Amer: >60 mL/min (ref >60)
GFR calc non Af Amer: >60 mL/min (ref >60)
GFR calc non Af Amer: >60 mL/min (ref >60)
Glucose, Bld: 182 mg/dL — ABNORMAL HIGH (ref 70–99)
Glucose, Bld: 301 mg/dL — ABNORMAL HIGH (ref 70–99)
Potassium: 3.8 mEq/L (ref 3.5–5.1)
Potassium: 4.1 mEq/L (ref 3.5–5.1)
Sodium: 134 mEq/L — ABNORMAL LOW (ref 135–145)
Sodium: 137 mEq/L (ref 135–145)

## 2010-10-09 LAB — COMPREHENSIVE METABOLIC PANEL
ALT: 13 U/L (ref 0–35)
Calcium: 9.2 mg/dL (ref 8.4–10.5)
Creatinine, Ser: 0.63 mg/dL (ref 0.4–1.2)
GFR calc Af Amer: >60 mL/min (ref >60)
Glucose, Bld: 349 mg/dL — ABNORMAL HIGH (ref 70–99)
Sodium: 135 mEq/L (ref 135–145)
Total Protein: 6.3 g/dL (ref 6.0–8.3)

## 2010-10-09 LAB — HEMOGLOBIN A1C
Hgb A1c MFr Bld: 8.7 % — ABNORMAL HIGH (ref 4.6–6.1)
Mean Plasma Glucose: 203 mg/dL

## 2010-10-09 LAB — CARDIAC PANEL(CRET KIN+CKTOT+MB+TROPI)
Relative Index: INVALID (ref 0.0–2.5)
Total CK: 31 U/L (ref 7–177)

## 2010-10-09 LAB — D-DIMER, QUANTITATIVE: D-Dimer, Quant: 0.28 ug/mL-FEU (ref 0.00–0.48)

## 2010-12-03 ENCOUNTER — Encounter: Payer: Self-pay | Admitting: Internal Medicine

## 2010-12-04 ENCOUNTER — Encounter: Payer: Self-pay | Admitting: Internal Medicine

## 2010-12-04 ENCOUNTER — Ambulatory Visit (INDEPENDENT_AMBULATORY_CARE_PROVIDER_SITE_OTHER): Payer: Commercial Indemnity | Admitting: Internal Medicine

## 2010-12-04 ENCOUNTER — Ambulatory Visit (INDEPENDENT_AMBULATORY_CARE_PROVIDER_SITE_OTHER)
Admission: RE | Admit: 2010-12-04 | Discharge: 2010-12-04 | Disposition: A | Discharge status: Home / Self Care | Payer: Commercial Indemnity | Source: Ambulatory Visit | Attending: Internal Medicine | Admitting: Internal Medicine

## 2010-12-04 VITALS — BP 170/74 | HR 111 | Temp 97.9°F | Ht 68.5 in | Wt 231.4 lb

## 2010-12-04 DIAGNOSIS — J9610 Chronic respiratory failure, unspecified whether with hypoxia or hypercapnia: Secondary | ICD-10-CM

## 2010-12-04 DIAGNOSIS — J449 Chronic obstructive pulmonary disease, unspecified: Secondary | ICD-10-CM

## 2010-12-04 DIAGNOSIS — F17200 Nicotine dependence, unspecified, uncomplicated: Secondary | ICD-10-CM

## 2010-12-04 DIAGNOSIS — J441 Chronic obstructive pulmonary disease with (acute) exacerbation: Secondary | ICD-10-CM | POA: Insufficient documentation

## 2010-12-04 DIAGNOSIS — I10 Essential (primary) hypertension: Secondary | ICD-10-CM

## 2010-12-04 MED ORDER — AMLODIPINE BESYLATE-VALSARTAN 5-160 MG PO TABS: 1.0000 | ORAL_TABLET | Freq: Every day | ORAL | Status: DC

## 2010-12-04 NOTE — Assessment & Plan Note (Addendum)
She has GOLD III severity but still her symptoms are markedly disproportionate to objective findings and not clear this is all related to her  lung problem but pt does appear to have difficult airway management issues. DDX of  difficult airways managment all start with A and  include Adherence, Ace Inhibitors, Acid Reflux, Active Sinus Disease, Alpha 1 Antitripsin deficiency, Anxiety masquerading as Airways dz,  ABPA,  allergy(esp in young), Aspiration (esp in elderly), Adverse effects of DPI,  Active smokers, plus two Bs  = Bronchiectasis and Beta blocker use..and one C= CHF   Adherence is always the initial "prime suspect" and is a multilayered concern that requires a "trust but verify" approach in every patient - starting with knowing how to use medications, especially inhalers, correctly, keeping up with refills and understanding the fundamental difference between maintenance and prns vs those medications only taken for a very short course and then stopped and not refilled. The proper method of use, as well as anticipated side effects, of this metered-dose inhaler are discussed and demonstrated to the patient. Improved only to 25% so if really has copd/ab will need to consider formoterol/ budesonide vs advair on next ov  ? Ace inhibitor effect > try off  Active smoking > discussed separately

## 2010-12-04 NOTE — Assessment & Plan Note (Addendum)
>   3 MIN discussion re:   I emphasized that although we never turn away smokers from the pulmonary clinic, we do ask that they understand that the recommendations that we make  won't work nearly as well in the presence of continued cigarette exposure.  In fact, we may very well  reach a point where we can't promise to help the patient if he/she can't quit smoking. (We can and will promise to try to help, we just can't promise what we recommend will really work)

## 2010-12-04 NOTE — Assessment & Plan Note (Signed)
Hypercarbic as well as hypoxemic Resp Failure probably related to obesity.  Needs TSH next ov

## 2010-12-04 NOTE — Assessment & Plan Note (Signed)

## 2010-12-04 NOTE — Progress Notes (Signed)
  Subjective:    Patient ID: [name], female    DOB: August 27, 1945, 65 y.o.   MRN: [id number]  HPI  65 yowf active smoker with dx of copd wear 02 2.5 at hs and most of the day dependent on her brother [name] for transportation and organization of meds.  64/30/2012 Pulmonary eval cc worse than usual doe x one month to point of across the room with persistent cough tbsp white mucus variable rattling seen by Dr [name] 65 days Prior to day of OV  rx prednisone doxy > some better. No assoc overt sinus symptoms, itching or sneezing. Some better p rx with nebs but not inhalers (poor technique noted)  Sleeping ok without nocturnal  or early am exacerbation  of respiratory  c/o's or need for noct saba. Also denies any obvious fluctuation of symptoms with weather or environmental changes or other aggravating or alleviating factors except as outlined above    Review of Systems  Constitutional: Negative for fever and unexpected weight change.  HENT: Negative for ear pain, nosebleeds, congestion, sore throat, rhinorrhea, sneezing, trouble swallowing, dental problem, postnasal drip and sinus pressure.   Eyes: Negative for redness and itching.  Respiratory: Positive for cough and shortness of breath. Negative for chest tightness and wheezing.   Cardiovascular: Negative for palpitations and leg swelling.  Gastrointestinal: Negative for nausea and vomiting.  Genitourinary: Negative for dysuria.  Musculoskeletal: Negative for joint swelling.  Skin: Negative for rash.  Neurological: Negative for headaches.  Hematological: Does not bruise/bleed easily.  Psychiatric/Behavioral: Negative for dysphoric mood. The patient is not nervous/anxious.        Objective:   Physical Exam Wt 231 12/04/2010  amb hoarse obese wf nad at rest on 02  HEENT mild turbinate edema.  Oropharynx no thrush or excess pnd or cobblestoning.  No JVD or cervical adenopathy. Mild accessory muscle hypertrophy. Trachea midline, nl  thryroid. Chest was hyperinflated by percussion with diminished breath sounds and moderate increased exp time without wheeze. Hoover sign positive at mid inspiration. Regular rate and rhythm without murmur gallop or rub or increase P2 or edema.  Abd: no hsm, nl excursion. Ext warm without cyanosis or clubbing.  d   CXR  12/04/2010 :  COPD. No active disease.       Assessment & Plan:

## 2010-12-04 NOTE — Patient Instructions (Addendum)
Stop amlodipine and lisinopril   Start exforge 160/5 one daily  Finish prednisone   Take symbicort Take 2 puffs first thing in am and then another 2 puffs about 12 hours later.    if needed for breathing when you can't catch your breath to use albuterol/atrovent every 4 hours   Work on inhaler technique:  relax and gently blow all the way out then take a nice smooth deep breath back in, triggering the inhaler at same time you start breathing in.  Hold for up to 5 seconds if you can.  Rinse and gargle with water when done   If your mouth or throat starts to bother you,   I suggest you time the inhaler to your dental care and after using the inhaler(s) brush teeth and tongue with a baking soda containing toothpaste and when you rinse this out, gargle with it first to see if this helps your mouth and throat.     Please remember to go to the   x-ray department downstairs for your tests - we will call you with the results when they are available.  NEEDS TSH NEXT OV     See Tammy NP w/in 4 weeks with all your medications, even over the counter meds, separated in two separate bags, the ones you take no matter what these are the ones you take in pillbox  vs the ones you stop once you feel better and take only as needed when you feel you need them.   Tammy  will generate for you a new user friendly medication calendar that will put [place] all on the same page re: your medication use.     Without this process, it simply isn't possible to assure that we are providing  your outpatient care  with  the attention to detail we feel you deserve.   If we cannot assure that you're getting that kind of care,  then we cannot manage your problem effectively from this clinic.  Once you have seen Tammy and we are sure that we're all on the same page with your medication use she will arrange follow up with me.

## 2010-12-08 ENCOUNTER — Telehealth: Payer: Self-pay | Admitting: Internal Medicine

## 2010-12-08 NOTE — Telephone Encounter (Signed)
Pt informed of cxr results per Dr Wert 

## 2010-12-21 ENCOUNTER — Telehealth: Payer: Self-pay | Admitting: Internal Medicine

## 2010-12-21 NOTE — Telephone Encounter (Signed)
[email or web address].Jennifer Castillo, CMA  

## 2010-12-22 NOTE — Telephone Encounter (Signed)
Pt's brother aware pt has an appt on Fri., 12/21 2 3:45 with TP for a med cal and pt will need to bring all of her medications with her to that appt. Pt's brother will make sure that she is here and will be with her since he handles most of her medications.

## 2010-12-22 NOTE — Telephone Encounter (Signed)
Pt's brother returned triage's call & can be reached at [phone].  Antionette Fairy

## 2010-12-25 ENCOUNTER — Encounter: Payer: Commercial Indemnity | Admitting: Adult Health

## 2011-01-06 ENCOUNTER — Telehealth: Payer: Self-pay | Admitting: Internal Medicine

## 2011-01-06 NOTE — Telephone Encounter (Signed)
Pt scheduled to see MW 01/07/11 at 3:15pm and advised to go to local UC or ER if can not wait. Has appointment with TP for med cal on Friday and I advised him pt will need to keep same.

## 2011-01-07 ENCOUNTER — Ambulatory Visit: Payer: Commercial Indemnity | Admitting: Internal Medicine

## 2011-01-08 ENCOUNTER — Telehealth: Payer: Self-pay | Admitting: Internal Medicine

## 2011-01-08 ENCOUNTER — Encounter: Payer: Commercial Indemnity | Admitting: Adult Health

## 2011-01-08 NOTE — Telephone Encounter (Signed)
Pt's brother coming in & will put note in at that time.  [name]

## 2011-01-08 NOTE — Telephone Encounter (Signed)
lmtcb

## 2011-01-11 NOTE — Telephone Encounter (Signed)
ATC line busy x 3 WCB 

## 2011-01-11 NOTE — Telephone Encounter (Signed)
LMTCB

## 2011-01-12 NOTE — Telephone Encounter (Signed)
LMOM and will sign per protocol

## 2011-01-15 ENCOUNTER — Ambulatory Visit (INDEPENDENT_AMBULATORY_CARE_PROVIDER_SITE_OTHER): Payer: Commercial Indemnity | Admitting: Adult Health

## 2011-01-15 ENCOUNTER — Encounter: Payer: Self-pay | Admitting: Adult Health

## 2011-01-15 DIAGNOSIS — I10 Essential (primary) hypertension: Secondary | ICD-10-CM

## 2011-01-15 DIAGNOSIS — J449 Chronic obstructive pulmonary disease, unspecified: Secondary | ICD-10-CM

## 2011-01-15 MED ORDER — AMLODIPINE BESYLATE-VALSARTAN 5-160 MG PO TABS: 1.0000 | ORAL_TABLET | Freq: Every day | ORAL | Status: DC

## 2011-01-15 NOTE — Patient Instructions (Signed)
Follow med calendar closely  And bring to each visit.  Continue on Exforge daily  follow up Dr. Sherene Sires  In 6 weeks and As needed   MOST IMPORTANT IS TO QUIT SMOKING.

## 2011-01-15 NOTE — Progress Notes (Signed)
  Subjective:    Patient ID: [name], female    DOB: 1945-07-12, 66 y.o.   MRN: [id number]  HPI 43 yowf active smoker with dx of copd wear 02 2.5 at hs and most of the day dependent on her brother [name] for transportation and organization of meds.  12/04/2010 Pulmonary eval cc worse than usual doe x one month to point of across the room with persistent cough tbsp white mucus variable rattling seen by Dr Jillyn Hidden 2 days Prior to day of OV  rx prednisone doxy > some better. No assoc overt sinus symptoms, itching or sneezing. Some better p rx with nebs but not inhalers (poor technique noted) >>ACE stopped , exforge stopped   01/15/2011 Follow up and med review  Pt returns for follow up and med review. Last visit ACE stopped and changed over to Exforge .  She is Feeling better much better with resolution of cough . We reviewed all her meds and organized them into a med calendar with pt education  No fever or chest pain .  Tolerating exforge without difficulty . Needs rx.    Review of Systems  Constitutional: Negative for fever and unexpected weight change.  HENT: Negative for ear pain, nosebleeds, congestion, sore throat, rhinorrhea, sneezing, trouble swallowing, dental problem, postnasal drip and sinus pressure.   Eyes: Negative for redness and itching.  Respiratory:  Negative for chest tightness and wheezing.   Cardiovascular: Negative for palpitations and leg swelling.  Gastrointestinal: Negative for nausea and vomiting.  Genitourinary: Negative for dysuria.  Musculoskeletal: Negative for joint swelling.  Skin: Negative for rash.  Neurological: Negative for headaches.  Hematological: Does not bruise/bleed easily.  Psychiatric/Behavioral: Negative for dysphoric mood. The patient is not nervous/anxious.        Objective:   Physical Exam   amb hoarse obese wf nad at rest on 02  HEENT mild turbinate edema.  Oropharynx no thrush or excess pnd or cobblestoning.  No JVD or cervical  adenopathy. Mild accessory muscle hypertrophy. Trachea midline, nl thryroid. Chest  Clear  Regular rate and rhythm without murmur gallop or rub or increase P2 or edema.  Abd: no hsm, nl excursion. Ext warm without cyanosis or clubbing.  d   CXR  12/04/2010 :  COPD. No active disease.       Assessment & Plan:

## 2011-01-19 NOTE — Assessment & Plan Note (Signed)
Controlled on exforge.  Cough improved off ACE  Cont on current reigmen rx refills  Low salt diet

## 2011-01-19 NOTE — Assessment & Plan Note (Signed)
Recent flare with upper airway cough now resolved off ACE inhibitor  Patient's medications were reviewed today and patient education was given. Computerized medication calendar was adjusted/completed   Cont on current regimen.  Smoking cesstation encourgaed

## 2011-01-19 NOTE — Progress Notes (Signed)
Addended by: Boone Master E on: 01/19/2011 01:20 PM   Modules accepted: Orders

## 2011-01-20 MED ORDER — ATORVASTATIN CALCIUM 20 MG PO TABS: 20.0000 mg | ORAL_TABLET | Freq: Every day | ORAL | Status: DC

## 2011-01-20 MED ORDER — THERA VITAL M PO TABS: 1.0000 | ORAL_TABLET | Freq: Every day | ORAL | Status: AC

## 2011-01-20 MED ORDER — OXYMETAZOLINE HCL 0.05 % NA SOLN: NASAL | Status: DC

## 2011-01-20 MED ORDER — DEXTROMETHORPHAN POLISTIREX 30 MG/5ML PO LQCR: ORAL | Status: DC

## 2011-01-20 MED ORDER — LOPERAMIDE HCL 2 MG PO TABS: ORAL_TABLET | ORAL | Status: DC

## 2011-01-20 MED ORDER — ASPIRIN EC 81 MG PO TBEC: 81.0000 mg | DELAYED_RELEASE_TABLET | Freq: Every day | ORAL | Status: AC

## 2011-01-20 MED ORDER — CHLORPHEN-PSEUDOEPHED-APAP 2-30-500 MG PO TABS: ORAL_TABLET | ORAL | Status: DC

## 2011-01-20 MED ORDER — IBUPROFEN 200 MG PO TABS: ORAL_TABLET | ORAL | Status: DC

## 2011-01-20 MED ORDER — DIAZEPAM 5 MG PO TABS: ORAL_TABLET | ORAL | Status: DC

## 2011-01-20 MED ORDER — ALBUTEROL SULFATE HFA 108 (90 BASE) MCG/ACT IN AERS: 2.0000 | INHALATION_SPRAY | RESPIRATORY_TRACT | Status: DC | PRN

## 2011-01-20 MED ORDER — DIPHENHYDRAMINE HCL 25 MG PO CAPS: 25.0000 mg | ORAL_CAPSULE | Freq: Every evening | ORAL | Status: AC | PRN

## 2011-01-20 NOTE — Progress Notes (Signed)
Addended by: Boone Master E on: 01/20/2011 03:07 PM   Modules accepted: Orders

## 2011-02-26 ENCOUNTER — Ambulatory Visit: Payer: Commercial Indemnity | Admitting: Internal Medicine

## 2011-03-12 ENCOUNTER — Ambulatory Visit: Payer: Commercial Indemnity | Admitting: Internal Medicine

## 2011-08-09 ENCOUNTER — Other Ambulatory Visit: Payer: Self-pay | Admitting: Adult Health

## 2011-09-24 DIAGNOSIS — L989 Disorder of the skin and subcutaneous tissue, unspecified: Secondary | ICD-10-CM | POA: Diagnosis not present

## 2011-09-24 DIAGNOSIS — I10 Essential (primary) hypertension: Secondary | ICD-10-CM | POA: Diagnosis not present

## 2011-09-24 DIAGNOSIS — E782 Mixed hyperlipidemia: Secondary | ICD-10-CM | POA: Diagnosis not present

## 2011-09-24 DIAGNOSIS — F411 Generalized anxiety disorder: Secondary | ICD-10-CM | POA: Diagnosis not present

## 2011-09-24 DIAGNOSIS — J438 Other emphysema: Secondary | ICD-10-CM | POA: Diagnosis not present

## 2011-09-24 DIAGNOSIS — E119 Type 2 diabetes mellitus without complications: Secondary | ICD-10-CM | POA: Diagnosis not present

## 2011-09-24 DIAGNOSIS — Z79899 Other long term (current) drug therapy: Secondary | ICD-10-CM | POA: Diagnosis not present

## 2011-09-24 DIAGNOSIS — Z23 Encounter for immunization: Secondary | ICD-10-CM | POA: Diagnosis not present

## 2011-10-09 ENCOUNTER — Other Ambulatory Visit: Payer: Self-pay | Admitting: Internal Medicine

## 2012-03-22 DIAGNOSIS — I10 Essential (primary) hypertension: Secondary | ICD-10-CM | POA: Diagnosis not present

## 2012-03-22 DIAGNOSIS — J438 Other emphysema: Secondary | ICD-10-CM | POA: Diagnosis not present

## 2012-03-22 DIAGNOSIS — H00019 Hordeolum externum unspecified eye, unspecified eyelid: Secondary | ICD-10-CM | POA: Diagnosis not present

## 2012-03-22 DIAGNOSIS — H00039 Abscess of eyelid unspecified eye, unspecified eyelid: Secondary | ICD-10-CM | POA: Diagnosis not present

## 2012-03-22 DIAGNOSIS — R82998 Other abnormal findings in urine: Secondary | ICD-10-CM | POA: Diagnosis not present

## 2012-03-22 DIAGNOSIS — E782 Mixed hyperlipidemia: Secondary | ICD-10-CM | POA: Diagnosis not present

## 2012-03-22 DIAGNOSIS — Z79899 Other long term (current) drug therapy: Secondary | ICD-10-CM | POA: Diagnosis not present

## 2012-03-22 DIAGNOSIS — E119 Type 2 diabetes mellitus without complications: Secondary | ICD-10-CM | POA: Diagnosis not present

## 2012-07-25 DIAGNOSIS — E1149 Type 2 diabetes mellitus with other diabetic neurological complication: Secondary | ICD-10-CM | POA: Diagnosis not present

## 2012-07-25 DIAGNOSIS — E1349 Other specified diabetes mellitus with other diabetic neurological complication: Secondary | ICD-10-CM | POA: Diagnosis not present

## 2012-07-25 DIAGNOSIS — I10 Essential (primary) hypertension: Secondary | ICD-10-CM | POA: Diagnosis not present

## 2012-07-25 DIAGNOSIS — J441 Chronic obstructive pulmonary disease with (acute) exacerbation: Secondary | ICD-10-CM | POA: Diagnosis not present

## 2012-07-25 DIAGNOSIS — F17200 Nicotine dependence, unspecified, uncomplicated: Secondary | ICD-10-CM | POA: Diagnosis not present

## 2012-07-25 DIAGNOSIS — Z79899 Other long term (current) drug therapy: Secondary | ICD-10-CM | POA: Diagnosis not present

## 2012-07-25 DIAGNOSIS — E782 Mixed hyperlipidemia: Secondary | ICD-10-CM | POA: Diagnosis not present

## 2012-10-19 DIAGNOSIS — I10 Essential (primary) hypertension: Secondary | ICD-10-CM | POA: Diagnosis not present

## 2012-10-19 DIAGNOSIS — F17200 Nicotine dependence, unspecified, uncomplicated: Secondary | ICD-10-CM | POA: Diagnosis not present

## 2012-10-19 DIAGNOSIS — K219 Gastro-esophageal reflux disease without esophagitis: Secondary | ICD-10-CM | POA: Diagnosis not present

## 2012-10-19 DIAGNOSIS — E119 Type 2 diabetes mellitus without complications: Secondary | ICD-10-CM | POA: Diagnosis not present

## 2012-10-19 DIAGNOSIS — Z79899 Other long term (current) drug therapy: Secondary | ICD-10-CM | POA: Diagnosis not present

## 2012-10-19 DIAGNOSIS — Z23 Encounter for immunization: Secondary | ICD-10-CM | POA: Diagnosis not present

## 2012-10-19 DIAGNOSIS — J441 Chronic obstructive pulmonary disease with (acute) exacerbation: Secondary | ICD-10-CM | POA: Diagnosis not present

## 2013-03-12 DIAGNOSIS — E782 Mixed hyperlipidemia: Secondary | ICD-10-CM | POA: Diagnosis not present

## 2013-03-12 DIAGNOSIS — E1149 Type 2 diabetes mellitus with other diabetic neurological complication: Secondary | ICD-10-CM | POA: Diagnosis not present

## 2013-03-12 DIAGNOSIS — I10 Essential (primary) hypertension: Secondary | ICD-10-CM | POA: Diagnosis not present

## 2013-03-12 DIAGNOSIS — Z79899 Other long term (current) drug therapy: Secondary | ICD-10-CM | POA: Diagnosis not present

## 2013-03-12 DIAGNOSIS — E1142 Type 2 diabetes mellitus with diabetic polyneuropathy: Secondary | ICD-10-CM | POA: Diagnosis not present

## 2013-03-12 DIAGNOSIS — K219 Gastro-esophageal reflux disease without esophagitis: Secondary | ICD-10-CM | POA: Diagnosis not present

## 2013-03-12 DIAGNOSIS — R82998 Other abnormal findings in urine: Secondary | ICD-10-CM | POA: Diagnosis not present

## 2013-03-12 DIAGNOSIS — J438 Other emphysema: Secondary | ICD-10-CM | POA: Diagnosis not present

## 2013-03-13 ENCOUNTER — Other Ambulatory Visit: Payer: Self-pay | Admitting: Family Medicine

## 2013-03-13 DIAGNOSIS — Z7952 Long term (current) use of systemic steroids: Secondary | ICD-10-CM

## 2013-03-13 DIAGNOSIS — Z1231 Encounter for screening mammogram for malignant neoplasm of breast: Secondary | ICD-10-CM

## 2013-10-19 DIAGNOSIS — Z23 Encounter for immunization: Secondary | ICD-10-CM | POA: Diagnosis not present

## 2013-12-21 DIAGNOSIS — J439 Emphysema, unspecified: Secondary | ICD-10-CM | POA: Diagnosis not present

## 2013-12-21 DIAGNOSIS — K219 Gastro-esophageal reflux disease without esophagitis: Secondary | ICD-10-CM | POA: Diagnosis not present

## 2013-12-21 DIAGNOSIS — Z23 Encounter for immunization: Secondary | ICD-10-CM | POA: Diagnosis not present

## 2013-12-21 DIAGNOSIS — Z1239 Encounter for other screening for malignant neoplasm of breast: Secondary | ICD-10-CM | POA: Diagnosis not present

## 2013-12-21 DIAGNOSIS — Z79899 Other long term (current) drug therapy: Secondary | ICD-10-CM | POA: Diagnosis not present

## 2013-12-21 DIAGNOSIS — E782 Mixed hyperlipidemia: Secondary | ICD-10-CM | POA: Diagnosis not present

## 2013-12-21 DIAGNOSIS — Z1211 Encounter for screening for malignant neoplasm of colon: Secondary | ICD-10-CM | POA: Diagnosis not present

## 2013-12-21 DIAGNOSIS — E1140 Type 2 diabetes mellitus with diabetic neuropathy, unspecified: Secondary | ICD-10-CM | POA: Diagnosis not present

## 2013-12-21 DIAGNOSIS — I10 Essential (primary) hypertension: Secondary | ICD-10-CM | POA: Diagnosis not present

## 2013-12-24 ENCOUNTER — Other Ambulatory Visit: Payer: Self-pay | Admitting: Family Medicine

## 2013-12-24 DIAGNOSIS — E2839 Other primary ovarian failure: Secondary | ICD-10-CM

## 2013-12-24 DIAGNOSIS — Z7952 Long term (current) use of systemic steroids: Secondary | ICD-10-CM

## 2014-01-03 DIAGNOSIS — Z1211 Encounter for screening for malignant neoplasm of colon: Secondary | ICD-10-CM | POA: Diagnosis not present

## 2014-01-11 ENCOUNTER — Ambulatory Visit
Admission: RE | Admit: 2014-01-11 | Discharge: 2014-01-11 | Disposition: A | Discharge status: Home / Self Care | Payer: Medicare Other | Source: Ambulatory Visit | Attending: Family Medicine | Admitting: Family Medicine

## 2014-01-11 DIAGNOSIS — Z1231 Encounter for screening mammogram for malignant neoplasm of breast: Secondary | ICD-10-CM | POA: Diagnosis not present

## 2014-01-11 DIAGNOSIS — Z7952 Long term (current) use of systemic steroids: Secondary | ICD-10-CM

## 2014-01-11 DIAGNOSIS — Z13820 Encounter for screening for osteoporosis: Secondary | ICD-10-CM | POA: Diagnosis not present

## 2014-01-11 DIAGNOSIS — E2839 Other primary ovarian failure: Secondary | ICD-10-CM

## 2014-01-11 DIAGNOSIS — Z780 Asymptomatic menopausal state: Secondary | ICD-10-CM | POA: Diagnosis not present

## 2014-08-13 DIAGNOSIS — F411 Generalized anxiety disorder: Secondary | ICD-10-CM | POA: Diagnosis not present

## 2014-08-13 DIAGNOSIS — K219 Gastro-esophageal reflux disease without esophagitis: Secondary | ICD-10-CM | POA: Diagnosis not present

## 2014-08-13 DIAGNOSIS — I10 Essential (primary) hypertension: Secondary | ICD-10-CM | POA: Diagnosis not present

## 2014-08-13 DIAGNOSIS — E785 Hyperlipidemia, unspecified: Secondary | ICD-10-CM | POA: Diagnosis not present

## 2014-08-13 DIAGNOSIS — Z79899 Other long term (current) drug therapy: Secondary | ICD-10-CM | POA: Diagnosis not present

## 2014-08-13 DIAGNOSIS — J439 Emphysema, unspecified: Secondary | ICD-10-CM | POA: Diagnosis not present

## 2014-08-13 DIAGNOSIS — R8299 Other abnormal findings in urine: Secondary | ICD-10-CM | POA: Diagnosis not present

## 2014-08-13 DIAGNOSIS — F17200 Nicotine dependence, unspecified, uncomplicated: Secondary | ICD-10-CM | POA: Diagnosis not present

## 2014-08-13 DIAGNOSIS — E1149 Type 2 diabetes mellitus with other diabetic neurological complication: Secondary | ICD-10-CM | POA: Diagnosis not present

## 2014-12-09 ENCOUNTER — Ambulatory Visit
Admission: RE | Admit: 2014-12-09 | Discharge: 2014-12-09 | Disposition: A | Discharge status: Home / Self Care | Payer: Medicare Other | Source: Ambulatory Visit | Attending: Family Medicine | Admitting: Family Medicine

## 2014-12-09 ENCOUNTER — Other Ambulatory Visit: Payer: Self-pay | Admitting: Family Medicine

## 2014-12-09 DIAGNOSIS — R062 Wheezing: Secondary | ICD-10-CM

## 2014-12-09 DIAGNOSIS — J449 Chronic obstructive pulmonary disease, unspecified: Secondary | ICD-10-CM

## 2014-12-09 DIAGNOSIS — R05 Cough: Secondary | ICD-10-CM | POA: Diagnosis not present

## 2014-12-09 DIAGNOSIS — R0602 Shortness of breath: Secondary | ICD-10-CM | POA: Diagnosis not present

## 2014-12-10 DIAGNOSIS — Z23 Encounter for immunization: Secondary | ICD-10-CM | POA: Diagnosis not present

## 2014-12-10 DIAGNOSIS — J42 Unspecified chronic bronchitis: Secondary | ICD-10-CM | POA: Diagnosis not present

## 2014-12-10 DIAGNOSIS — J019 Acute sinusitis, unspecified: Secondary | ICD-10-CM | POA: Diagnosis not present

## 2014-12-10 DIAGNOSIS — J441 Chronic obstructive pulmonary disease with (acute) exacerbation: Secondary | ICD-10-CM | POA: Diagnosis not present

## 2015-07-29 ENCOUNTER — Other Ambulatory Visit: Payer: Self-pay | Admitting: Family Medicine

## 2015-07-29 DIAGNOSIS — R42 Dizziness and giddiness: Secondary | ICD-10-CM

## 2016-03-03 DIAGNOSIS — Z23 Encounter for immunization: Secondary | ICD-10-CM | POA: Diagnosis not present

## 2016-03-03 DIAGNOSIS — Z79899 Other long term (current) drug therapy: Secondary | ICD-10-CM | POA: Diagnosis not present

## 2016-03-03 DIAGNOSIS — E785 Hyperlipidemia, unspecified: Secondary | ICD-10-CM | POA: Diagnosis not present

## 2016-03-03 DIAGNOSIS — I10 Essential (primary) hypertension: Secondary | ICD-10-CM | POA: Diagnosis not present

## 2016-03-03 DIAGNOSIS — R69 Illness, unspecified: Secondary | ICD-10-CM | POA: Diagnosis not present

## 2016-03-03 DIAGNOSIS — K219 Gastro-esophageal reflux disease without esophagitis: Secondary | ICD-10-CM | POA: Diagnosis not present

## 2016-03-03 DIAGNOSIS — E1142 Type 2 diabetes mellitus with diabetic polyneuropathy: Secondary | ICD-10-CM | POA: Diagnosis not present

## 2016-03-03 DIAGNOSIS — J449 Chronic obstructive pulmonary disease, unspecified: Secondary | ICD-10-CM | POA: Diagnosis not present

## 2016-03-03 DIAGNOSIS — J439 Emphysema, unspecified: Secondary | ICD-10-CM | POA: Diagnosis not present

## 2016-03-03 DIAGNOSIS — Z7984 Long term (current) use of oral hypoglycemic drugs: Secondary | ICD-10-CM | POA: Diagnosis not present

## 2016-03-17 DIAGNOSIS — E538 Deficiency of other specified B group vitamins: Secondary | ICD-10-CM | POA: Diagnosis not present

## 2016-03-24 DIAGNOSIS — E538 Deficiency of other specified B group vitamins: Secondary | ICD-10-CM | POA: Diagnosis not present

## 2016-03-31 DIAGNOSIS — E538 Deficiency of other specified B group vitamins: Secondary | ICD-10-CM | POA: Diagnosis not present

## 2016-04-01 DIAGNOSIS — J449 Chronic obstructive pulmonary disease, unspecified: Secondary | ICD-10-CM | POA: Diagnosis not present

## 2016-04-07 ENCOUNTER — Encounter (HOSPITAL_COMMUNITY): Payer: Self-pay | Admitting: Emergency Medicine

## 2016-04-07 ENCOUNTER — Emergency Department (HOSPITAL_COMMUNITY)
Admission: EM | Admit: 2016-04-07 | Discharge: 2016-04-07 | Disposition: A | Discharge status: Home / Self Care | Payer: Medicare HMO | Attending: Emergency Medicine | Admitting: Emergency Medicine

## 2016-04-07 ENCOUNTER — Emergency Department (HOSPITAL_COMMUNITY): Payer: Medicare HMO

## 2016-04-07 DIAGNOSIS — J441 Chronic obstructive pulmonary disease with (acute) exacerbation: Secondary | ICD-10-CM | POA: Diagnosis not present

## 2016-04-07 DIAGNOSIS — I7090 Unspecified atherosclerosis: Secondary | ICD-10-CM | POA: Diagnosis not present

## 2016-04-07 DIAGNOSIS — R69 Illness, unspecified: Secondary | ICD-10-CM | POA: Diagnosis not present

## 2016-04-07 DIAGNOSIS — R0602 Shortness of breath: Secondary | ICD-10-CM | POA: Diagnosis not present

## 2016-04-07 DIAGNOSIS — Z7984 Long term (current) use of oral hypoglycemic drugs: Secondary | ICD-10-CM | POA: Insufficient documentation

## 2016-04-07 DIAGNOSIS — Z79899 Other long term (current) drug therapy: Secondary | ICD-10-CM | POA: Insufficient documentation

## 2016-04-07 DIAGNOSIS — E119 Type 2 diabetes mellitus without complications: Secondary | ICD-10-CM | POA: Diagnosis not present

## 2016-04-07 DIAGNOSIS — F17200 Nicotine dependence, unspecified, uncomplicated: Secondary | ICD-10-CM | POA: Insufficient documentation

## 2016-04-07 DIAGNOSIS — I10 Essential (primary) hypertension: Secondary | ICD-10-CM | POA: Insufficient documentation

## 2016-04-07 DIAGNOSIS — R05 Cough: Secondary | ICD-10-CM | POA: Diagnosis not present

## 2016-04-07 DIAGNOSIS — Z7982 Long term (current) use of aspirin: Secondary | ICD-10-CM | POA: Insufficient documentation

## 2016-04-07 DIAGNOSIS — R0902 Hypoxemia: Secondary | ICD-10-CM | POA: Diagnosis not present

## 2016-04-07 HISTORY — DX: Chronic obstructive pulmonary disease, unspecified: J44.9

## 2016-04-07 LAB — COMPREHENSIVE METABOLIC PANEL
ALBUMIN: 3.6 g/dL (ref 3.5–5.0)
ALT: 16 U/L (ref 14–54)
AST: 22 U/L (ref 15–41)
Alkaline Phosphatase: 65 U/L (ref 38–126)
Anion gap: 11 (ref 5–15)
BILIRUBIN TOTAL: 0.8 mg/dL (ref 0.3–1.2)
BUN: 6 mg/dL (ref 6–20)
CO2: 32 mmol/L (ref 22–32)
Calcium: 8.9 mg/dL (ref 8.9–10.3)
Chloride: 88 mmol/L — ABNORMAL LOW (ref 101–111)
Creatinine, Ser: 0.51 mg/dL (ref 0.44–1.00)
GFR calc Af Amer: >60 mL/min (ref >60)
GFR calc non Af Amer: >60 mL/min (ref >60)
Glucose, Bld: 118 mg/dL — ABNORMAL HIGH (ref 65–99)
POTASSIUM: 3.8 mmol/L (ref 3.5–5.1)
SODIUM: 131 mmol/L — AB (ref 135–145)
Total Protein: 6.8 g/dL (ref 6.5–8.1)

## 2016-04-07 LAB — CBC
HEMATOCRIT: 35.3 % — AB (ref 36.0–46.0)
Hemoglobin: 11.2 g/dL — ABNORMAL LOW (ref 12.0–15.0)
MCH: 25.1 pg — ABNORMAL LOW (ref 26.0–34.0)
MCHC: 31.7 g/dL (ref 30.0–36.0)
MCV: 79.1 fL (ref 78.0–100.0)
PLATELETS: 225 10*3/uL (ref 150–400)
RBC: 4.46 MIL/uL (ref 3.87–5.11)
RDW: 16.6 % — AB (ref 11.5–15.5)
WBC: 9.1 10*3/uL (ref 4.0–10.5)

## 2016-04-07 LAB — BRAIN NATRIURETIC PEPTIDE: B NATRIURETIC PEPTIDE 5: 39.3 pg/mL (ref 0.0–100.0)

## 2016-04-07 LAB — I-STAT TROPONIN, ED: Troponin i, poc: 0 ng/mL (ref 0.00–0.08)

## 2016-04-07 MED ORDER — IPRATROPIUM BROMIDE 0.02 % IN SOLN
0.5000 mg | Freq: Once | RESPIRATORY_TRACT | Status: AC
  Administered 2016-04-07: 0.5 mg via RESPIRATORY_TRACT
  Filled 2016-04-07: qty 2.5

## 2016-04-07 MED ORDER — ALBUTEROL SULFATE (2.5 MG/3ML) 0.083% IN NEBU
5.0000 mg | INHALATION_SOLUTION | Freq: Once | RESPIRATORY_TRACT | Status: AC
  Administered 2016-04-07: 5 mg via RESPIRATORY_TRACT
  Filled 2016-04-07: qty 6

## 2016-04-07 MED ORDER — PREDNISONE 20 MG PO TABS: 40.0000 mg | ORAL_TABLET | Freq: Every day | ORAL | 0 refills | Status: DC

## 2016-04-07 MED ORDER — IOPAMIDOL (ISOVUE-370) INJECTION 76%
INTRAVENOUS | Status: AC
  Administered 2016-04-07: 100 mL
  Filled 2016-04-07: qty 100

## 2016-04-07 NOTE — ED Provider Notes (Signed)
[facility] DEPT Provider Note   CSN: [id number] Arrival date & time: 04/07/16  1522     History   Chief Complaint Chief Complaint  Patient presents with  . Shortness of Breath    HPI [name] is a 71 y.o. female.  Patient with past medical history of COPD, diabetes, hyperlipidemia, and hypertension presents emergency department with chief complaint of shortness of breath and productive cough 1 week. She states that she normally wears 2 L of home O2. She states that she has been having worsening symptoms over the past week. She has not tried taking anything for her symptoms. Her symptoms are worsened with exertion. There are no other associated symptoms, no fever, no chest pain, no vomiting, no abdominal pain. She denies any other modifying factors.   The history is provided by the patient. No language interpreter was used.    Past Medical History:  Diagnosis Date  . COPD (chronic obstructive pulmonary disease) ([place])   . Diabetes mellitus   . Diastolic dysfunction    history  . Hyperlipidemia   . Hypertension     Patient Active Problem List   Diagnosis Date Noted  . COPD (chronic obstructive pulmonary disease) ([place]) 12/04/2010  . Smoker 12/04/2010  . HYPERLIPIDEMIA 01/31/2008  . HYPERTENSION 01/31/2008  . Chronic respiratory failure ([place]) 01/31/2008    Past Surgical History:  Procedure Laterality Date  . TUBAL LIGATION      OB History    No data available       Home Medications    Prior to Admission medications   Medication Sig Start Date End Date Taking? Authorizing Provider  albuterol (PROAIR HFA) 108 (90 BASE) MCG/ACT inhaler Inhale 2 puffs into the lungs every 4 (four) hours as needed. 01/19/11  Yes Tammy S Parrett, NP  aspirin EC 81 MG tablet Take 81 mg by mouth daily.   Yes Historical Provider, MD  atorvastatin (LIPITOR) 20 MG tablet Take 1 tablet (20 mg total) by mouth daily. 01/19/11  Yes Tammy S Parrett, NP  budesonide-formoterol  (SYMBICORT) 160-4.5 MCG/ACT inhaler Inhale 2 puffs into the lungs 2 (two) times daily.     Yes Historical Provider, MD  BYETTA 10 MCG PEN 10 MCG/0.04ML SOLN Twice daily. 10/30/10  Yes Historical Provider, MD  diazepam (VALIUM) 5 MG tablet Take 1/2 tab twice daily as needed foranxiety 01/20/11  Yes Tammy S Parrett, NP  diphenhydrAMINE (BENADRYL) 25 MG tablet Take 25 mg by mouth every 6 (six) hours as needed for allergies or sleep.   Yes Historical Provider, MD  omeprazole (PRILOSEC) 20 MG capsule Take 1 tablet by mouth daily. 11/18/10  Yes Historical Provider, MD  OXYGEN Inhale into the lungs.   Yes Historical Provider, MD  PARoxetine (PAXIL) 30 MG tablet Take 30 mg by mouth every morning.     Yes Historical Provider, MD  Phenylephrine-Ibuprofen (ADVIL SINUS CONGESTION & PAIN) 10-200 MG TABS Take 1 tablet by mouth daily as needed (sinus).   Yes Historical Provider, MD  sitaGLIPtan-metformin (JANUMET) 50-1000 MG per tablet Take 1 tablet by mouth 2 (two) times daily with a meal.     Yes Historical Provider, MD  amLODipine-valsartan (EXFORGE) 5-160 MG per tablet Take 1 tablet by mouth daily. NEEDS APPT FOR FUTURE REFILLS 08/10/11   Tanda Rockers, MD  chlorpheniramine-pseudoephedrine-acetaminophen (TYLENOL ALLERGY SINUS) 2-30-500 MG per tablet Per box 01/20/11   Tammy S Parrett, NP  dextromethorphan (DELSYM) 30 MG/5ML liquid 2 tsp by mouth every 12 hours as needed for  cough 01/20/11   Tammy S Parrett, NP  doxycycline (VIBRAMYCIN) 100 MG capsule Take 1 tablet by mouth daily.  12/02/10   Historical Provider, MD  fluconazole (DIFLUCAN) 100 MG tablet Take 100 mg by mouth daily. As directed     Historical Provider, MD  ibuprofen (ADVIL) 200 MG tablet Per bottle 01/20/11   Tammy S Parrett, NP  ipratropium (ATROVENT HFA) 17 MCG/ACT inhaler Inhale 2 puffs into the lungs every 6 (six) hours.      Historical Provider, MD  loperamide (IMODIUM A-D) 2 MG tablet Per box 01/20/11   Tammy S Parrett, NP  oxymetazoline (AFRIN  NASAL SPRAY) 0.05 % nasal spray 2 puffs twice daily as needed x3 days 01/20/11   Tammy S Parrett, NP  predniSONE (DELTASONE) 20 MG tablet Take 2 tablets (40 mg total) by mouth daily. 04/07/16   Montine Circle, PA-C    Family History Family History  Problem Relation Age of Onset  . Heart disease Father   . Lung cancer Father     Social History Social History  Substance Use Topics  . Smoking status: Current Every Day Smoker    Packs/day: 0.50  . Smokeless tobacco: Never Used  . Alcohol use Not on file     Allergies   Patient has no known allergies.   Review of Systems Review of Systems  All other systems reviewed and are negative.    Physical Exam Updated Vital Signs BP (!) 170/69   Pulse 96   Temp 98 F (36.7 C) (Oral)   Resp (!) 26   SpO2 94%   Physical Exam  Constitutional: She is oriented to person, place, and time. She appears well-developed and well-nourished.  HENT:  Head: Normocephalic and atraumatic.  Eyes: Conjunctivae and EOM are normal. Pupils are equal, round, and reactive to light.  Neck: Normal range of motion. Neck supple.  Cardiovascular: Normal rate and regular rhythm.  Exam reveals no gallop and no friction rub.   No murmur heard. Pulmonary/Chest: Effort normal. No respiratory distress. She has wheezes. She has no rales. She exhibits no tenderness.  Abdominal: Soft. Bowel sounds are normal. She exhibits no distension and no mass. There is no tenderness. There is no rebound and no guarding.  Musculoskeletal: Normal range of motion. She exhibits no edema or tenderness.  Neurological: She is alert and oriented to person, place, and time.  Skin: Skin is warm and dry.  Psychiatric: She has a normal mood and affect. Her behavior is normal. Judgment and thought content normal.  Nursing note and vitals reviewed.    ED Treatments / Results  Labs (all labs ordered are listed, but only abnormal results are displayed) Labs Reviewed  CBC - Abnormal;  Notable for the following:       Result Value   Hemoglobin 11.2 (*)    HCT 35.3 (*)    MCH 25.1 (*)    RDW 16.6 (*)    All other components within normal limits  COMPREHENSIVE METABOLIC PANEL - Abnormal; Notable for the following:    Sodium 131 (*)    Chloride 88 (*)    Glucose, Bld 118 (*)    All other components within normal limits  BRAIN NATRIURETIC PEPTIDE  I-STAT TROPOININ, ED    EKG  EKG Interpretation  Date/Time:  Wednesday April 07 2016 15:25:56 EDT Ventricular Rate:  102 PR Interval:  158 QRS Duration: 76 QT Interval:  350 QTC Calculation: 456 R Axis:   78 Text Interpretation:  Sinus tachycardia  Low voltage QRS Cannot rule out Anterior infarct , age undetermined Abnormal ECG Since last tracing, rate has increased Otherwise no significant change Confirmed by Ellender Hose MD, Lysbeth Galas [phone]) on 04/07/2016 4:29:43 PM       Radiology Dg Chest 2 View  Result Date: 04/07/2016 CLINICAL DATA:  Shortness of breath and productive cough for the past week. History of COPD and diabetes. Current smoker. EXAM: CHEST  2 VIEW COMPARISON:  PA and lateral chest x-ray of December 09, 2014 FINDINGS: The lungs are adequately inflated. The interstitial markings are coarse though stable. There is no alveolar infiltrate or pleural effusion. The heart and pulmonary vascularity are normal. The mediastinum is normal in width. There is calcification in the wall of the thoracic aorta. The bony thorax exhibits no acute abnormality. IMPRESSION: There is no pneumonia nor CHF nor other acute cardiopulmonary abnormality. Stable chronic bronchitic changes. Thoracic aortic atherosclerosis. Electronically Signed   By: David  Martinique M.D.   On: 04/07/2016 16:09   Ct Angio Chest Pe W Or Wo Contrast  Result Date: 04/07/2016 CLINICAL DATA:  71 year old with an approximate 3-4 day history of progressively worsening shortness of breath. Hypoxia. Current history of diabetes, hypertension, COPD and diastolic dysfunction.  EXAM: CT ANGIOGRAPHY CHEST WITH CONTRAST TECHNIQUE: Multidetector CT imaging of the chest was performed using the standard protocol during bolus administration of intravenous contrast. Multiplanar CT image reconstructions and MIPs were obtained to evaluate the vascular anatomy. CONTRAST:  80 mL Isovue 370 IV. COMPARISON:  No prior CT. Multiple prior chest x-rays, including earlier today. FINDINGS: Cardiovascular: Contrast opacification of the pulmonary arteries is good. Respiratory motion blurred images of the lungs throughout the examination. Overall, the study is of good technical quality. No filling defects within either main pulmonary artery or their branches in either lung to suggest pulmonary embolism. Heart upper normal in size to slightly enlarged. Left ventricular hypertrophy. Moderate three-vessel coronary atherosclerosis. No pericardial effusion. Prominent epicardial fat. Severe atherosclerosis involving the thoracic and upper abdominal aorta without aneurysm or dissection. Atherosclerosis involving the proximal great vessels with a possible hemodynamically significant stenosis at the origin of the left subclavian artery. Mediastinum/Nodes: No pathologically enlarged mediastinal, hilar or axillary lymph nodes. No mediastinal masses. Normal-appearing esophagus. Visualized thyroid gland unremarkable. Lungs/Pleura: Emphysematous changes in both lungs. Patchy ground-glass opacities in the right middle lobe. No confluent airspace consolidation. Scarring in the lingula. Pleuroparenchymal scarring in the apices, right greater than left. No pleural effusions. Severe bronchial wall thickening diffusely throughout both lungs, involving all 5 lobes, with narrowing of the right lower lobe bronchus and the left upper lobe bronchus. Upper Abdomen: Relative enlargement of the left lobe of liver compared to the right lobe. Atherosclerosis at the origin of the celiac and SMA without visible stenosis. Visualized upper  abdomen otherwise unremarkable. Musculoskeletal: Osseous demineralization. Mild-to-moderate diffuse thoracic spondylosis. Degenerative changes involving the visualized lower cervical spine. Review of the MIP images confirms the above findings. IMPRESSION: 1. No evidence of pulmonary embolism. 2. Severe generalized bronchial wall thickening with narrowing of the right lower lobe bronchus and the left upper lobe bronchus. 3. COPD/emphysema. Patchy ground-glass opacities in the right middle lobe are likely inflammatory. No acute cardiopulmonary disease otherwise. 4. Query hepatic cirrhosis, as there is relative enlargement of the left lobe of the liver compared to the right lobe. 5. Generalized atherosclerosis, including three-vessel coronary atherosclerosis. Possible hemodynamically significant stenosis at the origin of the left subclavian artery. Electronically Signed   By: Evangeline Dakin M.D.   On: 04/07/2016  20:00    Procedures Procedures (including critical care time)  Medications Ordered in ED Medications  albuterol (PROVENTIL) (2.5 MG/3ML) 0.083% nebulizer solution 5 mg (5 mg Nebulization Given 04/07/16 1801)  ipratropium (ATROVENT) nebulizer solution 0.5 mg (0.5 mg Nebulization Given 04/07/16 1801)  iopamidol (ISOVUE-370) 76 % injection (100 mLs  Contrast Given 04/07/16 1920)     Initial Impression / Assessment and Plan / ED Course  I have reviewed the triage vital signs and the nursing notes.  Pertinent labs & imaging results that were available during my care of the patient were reviewed by me and considered in my medical decision making (see chart for details).     Patient with shortness of breath and wheezing and cough 1 week. Her Today is consistent with COPD exacerbation. Patient states that she now. Not go home, and would like to be discharged. She is on home oxygen. I have instructed her to use this as needed. Also recommend that she take prednisone, and use her inhaler every 4 hours  and follow-up with her primary care doctor tomorrow. She is instructed to return to the emergency department if her symptoms worsen. She understands and agrees with this plan.  Patient seen by and discussed with Dr. Tyrone Nine, who agrees with the plan.  Final Clinical Impressions(s) / ED Diagnoses   Final diagnoses:  COPD exacerbation (HCC)  Atherosclerosis    New Prescriptions New Prescriptions   PREDNISONE (DELTASONE) 20 MG TABLET    Take 2 tablets (40 mg total) by mouth daily.     Montine Circle, PA-C 04/07/16 [place], DO 04/07/16 2049

## 2016-04-07 NOTE — ED Notes (Signed)
Pt stated she was "OK" for car ride home w/o oxygen

## 2016-04-07 NOTE — ED Notes (Signed)
Pt transported to CT ?

## 2016-04-07 NOTE — ED Triage Notes (Signed)
Pt reports sob and productive cough x1 week,hx of copd, normally on 2L O2 at home. Pt pale, speaking in clear sentences.

## 2016-04-07 NOTE — Discharge Instructions (Signed)
You have evidence of COPD exacerbation seen on your CT.  It also showed atherosclerosis.  Please discuss this with your primary care doctor tomorrow.  Please take the prednisone as directed and use you inhaler every 4 hours as needed.  Return to the ER if your symptoms worsen.

## 2016-04-18 ENCOUNTER — Inpatient Hospital Stay (HOSPITAL_COMMUNITY)
Admission: EM | Admit: 2016-04-18 | Discharge: 2016-04-23 | DRG: 871 | Disposition: A | Discharge status: Skilled Nursing Facility | Payer: Medicare HMO | Attending: Internal Medicine | Admitting: Internal Medicine

## 2016-04-18 ENCOUNTER — Encounter (HOSPITAL_COMMUNITY): Payer: Self-pay | Admitting: *Deleted

## 2016-04-18 ENCOUNTER — Emergency Department (HOSPITAL_COMMUNITY): Payer: Medicare HMO

## 2016-04-18 DIAGNOSIS — J189 Pneumonia, unspecified organism: Secondary | ICD-10-CM | POA: Diagnosis present

## 2016-04-18 DIAGNOSIS — E669 Obesity, unspecified: Secondary | ICD-10-CM | POA: Diagnosis present

## 2016-04-18 DIAGNOSIS — J984 Other disorders of lung: Secondary | ICD-10-CM | POA: Diagnosis not present

## 2016-04-18 DIAGNOSIS — R0602 Shortness of breath: Secondary | ICD-10-CM

## 2016-04-18 DIAGNOSIS — E876 Hypokalemia: Secondary | ICD-10-CM | POA: Diagnosis present

## 2016-04-18 DIAGNOSIS — J9610 Chronic respiratory failure, unspecified whether with hypoxia or hypercapnia: Secondary | ICD-10-CM | POA: Diagnosis present

## 2016-04-18 DIAGNOSIS — I10 Essential (primary) hypertension: Secondary | ICD-10-CM | POA: Diagnosis not present

## 2016-04-18 DIAGNOSIS — T380X5A Adverse effect of glucocorticoids and synthetic analogues, initial encounter: Secondary | ICD-10-CM | POA: Diagnosis present

## 2016-04-18 DIAGNOSIS — F329 Major depressive disorder, single episode, unspecified: Secondary | ICD-10-CM | POA: Diagnosis present

## 2016-04-18 DIAGNOSIS — Z8249 Family history of ischemic heart disease and other diseases of the circulatory system: Secondary | ICD-10-CM | POA: Diagnosis not present

## 2016-04-18 DIAGNOSIS — D6489 Other specified anemias: Secondary | ICD-10-CM | POA: Diagnosis present

## 2016-04-18 DIAGNOSIS — E785 Hyperlipidemia, unspecified: Secondary | ICD-10-CM | POA: Diagnosis present

## 2016-04-18 DIAGNOSIS — Z6830 Body mass index (BMI) 30.0-30.9, adult: Secondary | ICD-10-CM

## 2016-04-18 DIAGNOSIS — F17200 Nicotine dependence, unspecified, uncomplicated: Secondary | ICD-10-CM | POA: Diagnosis present

## 2016-04-18 DIAGNOSIS — Z9981 Dependence on supplemental oxygen: Secondary | ICD-10-CM | POA: Diagnosis not present

## 2016-04-18 DIAGNOSIS — J9690 Respiratory failure, unspecified, unspecified whether with hypoxia or hypercapnia: Secondary | ICD-10-CM | POA: Diagnosis not present

## 2016-04-18 DIAGNOSIS — F17210 Nicotine dependence, cigarettes, uncomplicated: Secondary | ICD-10-CM | POA: Diagnosis present

## 2016-04-18 DIAGNOSIS — A419 Sepsis, unspecified organism: Secondary | ICD-10-CM | POA: Diagnosis not present

## 2016-04-18 DIAGNOSIS — E119 Type 2 diabetes mellitus without complications: Secondary | ICD-10-CM | POA: Diagnosis not present

## 2016-04-18 DIAGNOSIS — Z716 Tobacco abuse counseling: Secondary | ICD-10-CM

## 2016-04-18 DIAGNOSIS — E1165 Type 2 diabetes mellitus with hyperglycemia: Secondary | ICD-10-CM | POA: Diagnosis present

## 2016-04-18 DIAGNOSIS — I119 Hypertensive heart disease without heart failure: Secondary | ICD-10-CM | POA: Diagnosis present

## 2016-04-18 DIAGNOSIS — Z801 Family history of malignant neoplasm of trachea, bronchus and lung: Secondary | ICD-10-CM

## 2016-04-18 DIAGNOSIS — J440 Chronic obstructive pulmonary disease with acute lower respiratory infection: Secondary | ICD-10-CM | POA: Diagnosis present

## 2016-04-18 DIAGNOSIS — R69 Illness, unspecified: Secondary | ICD-10-CM | POA: Diagnosis not present

## 2016-04-18 DIAGNOSIS — R6520 Severe sepsis without septic shock: Secondary | ICD-10-CM | POA: Diagnosis not present

## 2016-04-18 DIAGNOSIS — Z7951 Long term (current) use of inhaled steroids: Secondary | ICD-10-CM | POA: Diagnosis not present

## 2016-04-18 DIAGNOSIS — J9621 Acute and chronic respiratory failure with hypoxia: Secondary | ICD-10-CM | POA: Diagnosis not present

## 2016-04-18 DIAGNOSIS — Z79899 Other long term (current) drug therapy: Secondary | ICD-10-CM

## 2016-04-18 DIAGNOSIS — E871 Hypo-osmolality and hyponatremia: Secondary | ICD-10-CM | POA: Diagnosis present

## 2016-04-18 DIAGNOSIS — Z7982 Long term (current) use of aspirin: Secondary | ICD-10-CM

## 2016-04-18 DIAGNOSIS — J9601 Acute respiratory failure with hypoxia: Secondary | ICD-10-CM | POA: Diagnosis not present

## 2016-04-18 DIAGNOSIS — J449 Chronic obstructive pulmonary disease, unspecified: Secondary | ICD-10-CM | POA: Diagnosis not present

## 2016-04-18 DIAGNOSIS — R05 Cough: Secondary | ICD-10-CM | POA: Diagnosis not present

## 2016-04-18 DIAGNOSIS — E872 Acidosis: Secondary | ICD-10-CM | POA: Diagnosis present

## 2016-04-18 DIAGNOSIS — J441 Chronic obstructive pulmonary disease with (acute) exacerbation: Secondary | ICD-10-CM | POA: Diagnosis not present

## 2016-04-18 DIAGNOSIS — D72829 Elevated white blood cell count, unspecified: Secondary | ICD-10-CM | POA: Diagnosis not present

## 2016-04-18 LAB — CBC WITH DIFFERENTIAL/PLATELET
Basophils Absolute: 0 10*3/uL (ref 0.0–0.1)
Basophils Relative: 0 %
EOS ABS: 0 10*3/uL (ref 0.0–0.7)
EOS PCT: 0 %
HCT: 34.6 % — ABNORMAL LOW (ref 36.0–46.0)
HEMOGLOBIN: 11 g/dL — AB (ref 12.0–15.0)
Lymphocytes Relative: 2 %
Lymphs Abs: 0.5 10*3/uL — ABNORMAL LOW (ref 0.7–4.0)
MCH: 24.8 pg — AB (ref 26.0–34.0)
MCHC: 31.8 g/dL (ref 30.0–36.0)
MCV: 77.9 fL — ABNORMAL LOW (ref 78.0–100.0)
Monocytes Absolute: 1.9 10*3/uL — ABNORMAL HIGH (ref 0.1–1.0)
Monocytes Relative: 9 %
NEUTROS PCT: 89 %
Neutro Abs: 18.8 10*3/uL — ABNORMAL HIGH (ref 1.7–7.7)
Platelets: 320 10*3/uL (ref 150–400)
RBC: 4.44 MIL/uL (ref 3.87–5.11)
RDW: 16.5 % — ABNORMAL HIGH (ref 11.5–15.5)
WBC: 21.3 10*3/uL — AB (ref 4.0–10.5)

## 2016-04-18 LAB — COMPREHENSIVE METABOLIC PANEL
ALK PHOS: 126 U/L (ref 38–126)
ALT: 34 U/L (ref 14–54)
AST: 30 U/L (ref 15–41)
Albumin: 2.5 g/dL — ABNORMAL LOW (ref 3.5–5.0)
Anion gap: 15 (ref 5–15)
BUN: 21 mg/dL — ABNORMAL HIGH (ref 6–20)
CALCIUM: 7.9 mg/dL — AB (ref 8.9–10.3)
CHLORIDE: 82 mmol/L — AB (ref 101–111)
CO2: 32 mmol/L (ref 22–32)
CREATININE: 0.93 mg/dL (ref 0.44–1.00)
Glucose, Bld: 147 mg/dL — ABNORMAL HIGH (ref 65–99)
Potassium: 3.1 mmol/L — ABNORMAL LOW (ref 3.5–5.1)
Sodium: 129 mmol/L — ABNORMAL LOW (ref 135–145)
Total Bilirubin: 0.8 mg/dL (ref 0.3–1.2)
Total Protein: 6.5 g/dL (ref 6.5–8.1)

## 2016-04-18 LAB — PROTIME-INR
INR: 1.18
PROTHROMBIN TIME: 15.1 s (ref 11.4–15.2)

## 2016-04-18 LAB — LACTIC ACID, PLASMA
Lactic Acid, Venous: 1.3 mmol/L (ref 0.5–1.9)
Lactic Acid, Venous: 4.7 mmol/L (ref 0.5–1.9)

## 2016-04-18 LAB — PROCALCITONIN: PROCALCITONIN: 0.54 ng/mL

## 2016-04-18 LAB — I-STAT TROPONIN, ED: TROPONIN I, POC: 0 ng/mL (ref 0.00–0.08)

## 2016-04-18 LAB — MRSA PCR SCREENING: MRSA BY PCR: NEGATIVE

## 2016-04-18 LAB — APTT: APTT: 40 s — AB (ref 24–36)

## 2016-04-18 LAB — GLUCOSE, CAPILLARY: GLUCOSE-CAPILLARY: 381 mg/dL — AB (ref 65–99)

## 2016-04-18 MED ORDER — DEXTROSE 5 % IV SOLN: 500.0000 mg | INTRAVENOUS | Status: DC

## 2016-04-18 MED ORDER — ASPIRIN EC 81 MG PO TBEC
81.0000 mg | DELAYED_RELEASE_TABLET | Freq: Every day | ORAL | Status: DC
  Administered 2016-04-19 – 2016-04-23 (×5): 81 mg via ORAL
  Filled 2016-04-18 (×5): qty 1

## 2016-04-18 MED ORDER — DIPHENHYDRAMINE HCL 25 MG PO TABS
50.0000 mg | ORAL_TABLET | Freq: Every day | ORAL | Status: DC
  Administered 2016-04-18 – 2016-04-21 (×4): 50 mg via ORAL
  Filled 2016-04-18 (×8): qty 2

## 2016-04-18 MED ORDER — DEXTROSE 5 % IV SOLN
2.0000 g | Freq: Once | INTRAVENOUS | Status: AC
  Administered 2016-04-18: 2 g via INTRAVENOUS
  Filled 2016-04-18: qty 2

## 2016-04-18 MED ORDER — METHYLPREDNISOLONE SODIUM SUCC 125 MG IJ SOLR
80.0000 mg | Freq: Once | INTRAMUSCULAR | Status: AC
  Administered 2016-04-18: 80 mg via INTRAVENOUS
  Filled 2016-04-18: qty 2

## 2016-04-18 MED ORDER — IPRATROPIUM-ALBUTEROL 0.5-2.5 (3) MG/3ML IN SOLN
3.0000 mL | RESPIRATORY_TRACT | Status: DC | PRN
  Administered 2016-04-19: 3 mL via RESPIRATORY_TRACT
  Filled 2016-04-18: qty 3

## 2016-04-18 MED ORDER — DEXTROSE 5 % IV SOLN: 2.0000 g | INTRAVENOUS | Status: DC

## 2016-04-18 MED ORDER — POTASSIUM CHLORIDE CRYS ER 20 MEQ PO TBCR
40.0000 meq | EXTENDED_RELEASE_TABLET | Freq: Once | ORAL | Status: AC
  Administered 2016-04-18: 40 meq via ORAL
  Filled 2016-04-18: qty 2

## 2016-04-18 MED ORDER — MOMETASONE FURO-FORMOTEROL FUM 200-5 MCG/ACT IN AERO
2.0000 | INHALATION_SPRAY | Freq: Two times a day (BID) | RESPIRATORY_TRACT | Status: DC
  Administered 2016-04-18: 2 via RESPIRATORY_TRACT
  Filled 2016-04-18: qty 8.8

## 2016-04-18 MED ORDER — POTASSIUM CHLORIDE CRYS ER 20 MEQ PO TBCR: 40.0000 meq | EXTENDED_RELEASE_TABLET | Freq: Once | ORAL | Status: DC

## 2016-04-18 MED ORDER — IPRATROPIUM BROMIDE 0.02 % IN SOLN
0.5000 mg | Freq: Once | RESPIRATORY_TRACT | Status: AC
  Administered 2016-04-18: 0.5 mg via RESPIRATORY_TRACT
  Filled 2016-04-18: qty 2.5

## 2016-04-18 MED ORDER — SODIUM CHLORIDE 0.9 % IV BOLUS (SEPSIS)
1000.0000 mL | Freq: Once | INTRAVENOUS | Status: AC
  Administered 2016-04-18: 1000 mL via INTRAVENOUS

## 2016-04-18 MED ORDER — PANTOPRAZOLE SODIUM 40 MG PO TBEC
40.0000 mg | DELAYED_RELEASE_TABLET | Freq: Every day | ORAL | Status: DC
  Administered 2016-04-19 – 2016-04-23 (×5): 40 mg via ORAL
  Filled 2016-04-18 (×5): qty 1

## 2016-04-18 MED ORDER — SODIUM CHLORIDE 0.9 % IV SOLN
INTRAVENOUS | Status: AC
  Administered 2016-04-18 – 2016-04-19 (×2): via INTRAVENOUS

## 2016-04-18 MED ORDER — DEXTROSE 5 % IV SOLN
500.0000 mg | INTRAVENOUS | Status: DC
  Administered 2016-04-18 – 2016-04-22 (×5): 500 mg via INTRAVENOUS
  Filled 2016-04-18 (×7): qty 500

## 2016-04-18 MED ORDER — EXENATIDE 10 MCG/0.04ML ~~LOC~~ SOPN: 10.0000 ug | PEN_INJECTOR | Freq: Two times a day (BID) | SUBCUTANEOUS | Status: DC

## 2016-04-18 MED ORDER — IBUPROFEN 400 MG PO TABS
400.0000 mg | ORAL_TABLET | Freq: Four times a day (QID) | ORAL | Status: DC | PRN
  Filled 2016-04-18: qty 2

## 2016-04-18 MED ORDER — PAROXETINE HCL 20 MG PO TABS
30.0000 mg | ORAL_TABLET | Freq: Every day | ORAL | Status: DC
  Administered 2016-04-19 – 2016-04-23 (×5): 30 mg via ORAL
  Filled 2016-04-18 (×5): qty 2

## 2016-04-18 MED ORDER — INSULIN ASPART 100 UNIT/ML ~~LOC~~ SOLN
0.0000 [IU] | Freq: Three times a day (TID) | SUBCUTANEOUS | Status: DC
  Administered 2016-04-19: 3 [IU] via SUBCUTANEOUS
  Administered 2016-04-19: 2 [IU] via SUBCUTANEOUS
  Administered 2016-04-19 – 2016-04-20 (×3): 3 [IU] via SUBCUTANEOUS
  Administered 2016-04-20 – 2016-04-21 (×2): 2 [IU] via SUBCUTANEOUS
  Administered 2016-04-21: 5 [IU] via SUBCUTANEOUS
  Administered 2016-04-21: 3 [IU] via SUBCUTANEOUS
  Administered 2016-04-22: 5 [IU] via SUBCUTANEOUS
  Administered 2016-04-22: 3 [IU] via SUBCUTANEOUS
  Administered 2016-04-22 – 2016-04-23 (×2): 9 [IU] via SUBCUTANEOUS
  Administered 2016-04-23: 2 [IU] via SUBCUTANEOUS

## 2016-04-18 MED ORDER — AMLODIPINE BESYLATE-VALSARTAN 5-160 MG PO TABS: 1.0000 | ORAL_TABLET | Freq: Every day | ORAL | Status: DC

## 2016-04-18 MED ORDER — DEXTROSE 5 % IV SOLN
1.0000 g | INTRAVENOUS | Status: DC
  Administered 2016-04-19 – 2016-04-22 (×4): 1 g via INTRAVENOUS
  Filled 2016-04-18 (×5): qty 10

## 2016-04-18 MED ORDER — PHENYLEPHRINE-IBUPROFEN 10-200 MG PO TABS: 1.0000 | ORAL_TABLET | Freq: Every day | ORAL | Status: DC | PRN

## 2016-04-18 MED ORDER — METHYLPREDNISOLONE SODIUM SUCC 125 MG IJ SOLR
60.0000 mg | Freq: Four times a day (QID) | INTRAMUSCULAR | Status: DC
  Administered 2016-04-18 – 2016-04-21 (×11): 60 mg via INTRAVENOUS
  Filled 2016-04-18 (×11): qty 2

## 2016-04-18 MED ORDER — DEXTROSE 5 % IV SOLN: 1.0000 g | Freq: Once | INTRAVENOUS | Status: DC

## 2016-04-18 MED ORDER — IRBESARTAN 300 MG PO TABS
150.0000 mg | ORAL_TABLET | Freq: Every day | ORAL | Status: DC
  Administered 2016-04-19 – 2016-04-23 (×5): 150 mg via ORAL
  Filled 2016-04-18 (×5): qty 1

## 2016-04-18 MED ORDER — ENOXAPARIN SODIUM 40 MG/0.4ML ~~LOC~~ SOLN
40.0000 mg | SUBCUTANEOUS | Status: DC
  Administered 2016-04-18 – 2016-04-22 (×5): 40 mg via SUBCUTANEOUS
  Filled 2016-04-18 (×5): qty 0.4

## 2016-04-18 MED ORDER — ALBUTEROL SULFATE (2.5 MG/3ML) 0.083% IN NEBU
5.0000 mg | INHALATION_SOLUTION | Freq: Once | RESPIRATORY_TRACT | Status: AC
  Administered 2016-04-18: 5 mg via RESPIRATORY_TRACT
  Filled 2016-04-18: qty 6

## 2016-04-18 MED ORDER — AZITHROMYCIN 500 MG IV SOLR: 500.0000 mg | Freq: Once | INTRAVENOUS | Status: DC

## 2016-04-18 MED ORDER — AMLODIPINE BESYLATE 10 MG PO TABS
10.0000 mg | ORAL_TABLET | Freq: Every day | ORAL | Status: DC
  Administered 2016-04-19 – 2016-04-23 (×5): 10 mg via ORAL
  Filled 2016-04-18 (×3): qty 1
  Filled 2016-04-18: qty 2
  Filled 2016-04-18: qty 1

## 2016-04-18 MED ORDER — IPRATROPIUM-ALBUTEROL 0.5-2.5 (3) MG/3ML IN SOLN
3.0000 mL | Freq: Four times a day (QID) | RESPIRATORY_TRACT | Status: DC
  Administered 2016-04-18 – 2016-04-22 (×17): 3 mL via RESPIRATORY_TRACT
  Filled 2016-04-18 (×17): qty 3

## 2016-04-18 MED ORDER — DIAZEPAM 5 MG PO TABS: 5.0000 mg | ORAL_TABLET | Freq: Every day | ORAL | Status: DC | PRN

## 2016-04-18 NOTE — H&P (Signed)
04/18/2016 Central monitor called patient was in Afib RVR for 30 sec and HR was 150 at [place]. Twelve lead EKG was order and she was Sinus Rhythm with PAC. Patient  was asymptomatic. Lamar Blinks (NP)  Was text via Amion and Third shift RN was made aware. [facility] RN.

## 2016-04-18 NOTE — ED Triage Notes (Signed)
Per EMS- pt had SOB/congestion for 1 week. Pt had low sats at 80 initially no 95% on Neb of 10mg  albuterol and 0.5 atrovent. 125mg  solumedrol given. Pt had rhonchi in all fields per ems.

## 2016-04-18 NOTE — ED Notes (Signed)
Pt placed on 3L pt is normally on 2L of oxygen at home

## 2016-04-18 NOTE — ED Notes (Signed)
ED Provider at bedside. 

## 2016-04-18 NOTE — Progress Notes (Signed)
Pharmacy Antibiotic Note  [name] is a 71 y.o. female admitted on 04/18/2016 with pneumonia.  Pharmacy has been consulted for rocephin/azithromycin dosing. -Azithromycin 500mg  IV x1 and rocephin 1gm IV ordered in ED  Plan: -Rocephin 2gm IV q24hr -Azithromycin 500mg  IV q24hr -No dose adjustments anticipated.  -Will sign off. Please contact pharmacy with any other needs.  Thank you      Temp (24hrs), Avg:97.6 F (36.4 C), Min:97.6 F (36.4 C), Max:97.6 F (36.4 C)   Recent Labs Lab 04/18/16 1632  WBC 21.3*    CrCl cannot be calculated (Unknown ideal weight.).    No Known Allergies  Thank you for allowing pharmacy to be a part of this patient's care.  Hildred Laser, Pharm D 04/18/2016 5:08 PM

## 2016-04-18 NOTE — H&P (Signed)
History and Physical    [name] KYH:[id number] DOB: [birth year] DOA: 04/18/2016  Referring MD/NP/PA: Dr. Ashok Cordia  PCP: Pcp Not In System   Patient coming from: home   Chief Complaint: dyspnea   HPI: [name] is a 71 y.o. female with medical history significant of HTN, COPD, smoker, DM type II, presented due to progressively worsening dyspnea, with exertion and at rest, associated with subjective fevers, chills, cough productive of yellow sputum, poor oral intake. Pt also reports occasional chest tightness that occurs with coughing spells, no chest pain at this time. Pt denies any specific abd and urinary concerns.   ED Course: Pt hemodynamically stable, VS notable for HR up to 113, RR up to 25, BP stable, blood work notable for WBC 21 K, CXR notable for multilobar PNA. Pt started on Zithromax and Rocephin. TRH asked to admit for further evaluation.   Review of Systems:  Constitutional: Negative for appetite change and fatigue.  HENT: Negative for ear pain, nosebleeds, rhinorrhea, neck pain, neck stiffness and ear discharge.   Eyes: Negative for pain, discharge, redness, itching and visual disturbance.  Respiratory: per HPI Cardiovascular: Negative for chest pain, palpitations and leg swelling.  Gastrointestinal: Negative for abdominal distention.  Genitourinary: Negative for dysuria, urgency, frequency, hematuria, flank pain, decreased urine volume, difficulty urinating and dyspareunia.  Musculoskeletal: Negative for back pain, joint swelling, arthralgias and gait problem.  Neurological: Negative for dizziness, tremors, seizures, syncope, facial asymmetry, speech difficulty, weakness, light-headedness, numbness and headaches.  Hematological: Negative for adenopathy. Does not bruise/bleed easily.  Psychiatric/Behavioral: Negative for hallucinations, behavioral problems, confusion, dysphoric mood, decreased concentration and agitation.   Past Medical History:    Diagnosis Date  . COPD (chronic obstructive pulmonary disease) ([place])   . Diabetes mellitus   . Diastolic dysfunction    history  . Hyperlipidemia   . Hypertension     Past Surgical History:  Procedure Laterality Date  . TUBAL LIGATION     Social Hx:  reports that she has been smoking.  She has been smoking about 0.50 packs per day. She has never used smokeless tobacco. Her alcohol and drug histories are not on file.  No Known Allergies  Family History  Problem Relation Age of Onset  . Heart disease Father   . Lung cancer Father     Prior to Admission medications   Medication Sig Start Date End Date Taking? Authorizing Provider  albuterol (PROAIR HFA) 108 (90 BASE) MCG/ACT inhaler Inhale 2 puffs into the lungs every 4 (four) hours as needed. Patient taking differently: Inhale 2 puffs into the lungs every 4 (four) hours as needed for shortness of breath.  01/19/11  Yes Tammy S Parrett, NP  amLODipine (NORVASC) 5 MG tablet Take 5 mg by mouth daily.   Yes Historical Provider, MD  amLODipine-valsartan (EXFORGE) 5-160 MG tablet Take 1 tablet by mouth daily.   Yes Historical Provider, MD  aspirin EC 81 MG tablet Take 81 mg by mouth daily.   Yes Historical Provider, MD  atorvastatin (LIPITOR) 20 MG tablet Take 1 tablet (20 mg total) by mouth daily. Patient taking differently: Take 20 mg by mouth at bedtime.  01/19/11  Yes Tammy S Parrett, NP  budesonide-formoterol (SYMBICORT) 160-4.5 MCG/ACT inhaler Inhale 2 puffs into the lungs 2 (two) times daily.     Yes Historical Provider, MD  diazepam (VALIUM) 5 MG tablet Take 1/2 tab twice daily as needed foranxiety Patient taking differently: Take 5 mg by mouth daily as  needed (anxiety when leaving the house).  01/20/11  Yes Tammy S Parrett, NP  diphenhydrAMINE (BENADRYL) 25 MG tablet Take 50 mg by mouth at bedtime.    Yes Historical Provider, MD  exenatide (BYETTA) 10 MCG/0.04ML SOPN injection Inject 10 mcg into the skin 2 (two) times daily with  a meal.   Yes Historical Provider, MD  ibuprofen (ADVIL,MOTRIN) 200 MG tablet Take 400 mg by mouth every 6 (six) hours as needed for headache (pain).   Yes Historical Provider, MD  Multiple Vitamin (MULTIVITAMIN WITH MINERALS) TABS tablet Take 1 tablet by mouth daily.   Yes Historical Provider, MD  omeprazole (PRILOSEC) 20 MG capsule Take 1 tablet by mouth daily. 11/18/10  Yes Historical Provider, MD  OXYGEN Inhale 2 L into the lungs continuous.    Yes Historical Provider, MD  PARoxetine (PAXIL) 30 MG tablet Take 30 mg by mouth daily.    Yes Historical Provider, MD  Phenylephrine-Ibuprofen (ADVIL SINUS CONGESTION & PAIN) 10-200 MG TABS Take 1 tablet by mouth daily as needed (sinus congestion).    Yes Historical Provider, MD  sitaGLIPtan-metformin (JANUMET) 50-1000 MG per tablet Take 1 tablet by mouth 2 (two) times daily with a meal.     Yes Historical Provider, MD    Physical Exam: Vitals:   04/18/16 1547 04/18/16 1548  BP: (!) 123/106   Pulse: (!) 113   Resp: (!) 25   Temp:  97.6 F (36.4 C)  TempSrc:  Axillary  SpO2: 90%     Constitutional: in mild distress due to dyspnea  Vitals:   04/18/16 1547 04/18/16 1548  BP: (!) 123/106   Pulse: (!) 113   Resp: (!) 25   Temp:  97.6 F (36.4 C)  TempSrc:  Axillary  SpO2: 90%    Eyes: PERRL, lids and conjunctivae normal ENMT: Mucous membranes are moist. Posterior pharynx clear of any exudate or lesions.Normal dentition.  Neck: normal, supple, no masses, no thyromegaly Respiratory: course breath sounds bilaterally with diffuse wheezing and bilateral rhonchi worse at bases  Cardiovascular: tachycardic, no murmurs / rubs / gallops. No carotid bruits.  Abdomen: no tenderness, no masses palpated. No hepatosplenomegaly. Bowel sounds positive.  Musculoskeletal: no clubbing / cyanosis. No joint deformity upper and lower extremities. Good ROM, no contractures. Normal muscle tone.  Skin: no rashes, lesions, ulcers. No induration Neurologic: CN  2-12 grossly intact. Sensation intact, DTR normal. Strength 5/5 in all 4.  Psychiatric: Normal judgment and insight. Alert and oriented x 3. Normal mood.   Labs on Admission: I have personally reviewed following labs and imaging studies  CBC:  Recent Labs Lab 04/18/16 1632  WBC 21.3*  NEUTROABS 18.8*  HGB 11.0*  HCT 34.6*  MCV 77.9*  PLT 078   Basic Metabolic Panel:  Recent Labs Lab 04/18/16 1632  NA 129*  K 3.1*  CL 82*  CO2 32  GLUCOSE 147*  BUN 21*  CREATININE 0.93  CALCIUM 7.9*   Liver Function Tests:  Recent Labs Lab 04/18/16 1632  AST 30  ALT 34  ALKPHOS 126  BILITOT 0.8  PROT 6.5  ALBUMIN 2.5*   Radiological Exams on Admission: Dg Chest Port 1 View  Result Date: 04/18/2016 CLINICAL DATA:  Cough.  Dyspnea. EXAM: PORTABLE CHEST 1 VIEW COMPARISON:  04/07/2016 chest radiograph. FINDINGS: Stable cardiomediastinal silhouette with top-normal heart size and aortic atherosclerosis. No pneumothorax. No pleural effusion. Patchy consolidation in the peripheral mid to lower right lung and at the left lung base, which appears new. IMPRESSION: New  patchy consolidation in the peripheral mid to lower right lung and at the left lung base, suspicious for multilobar pneumonia. Recommend follow-up PA and lateral post treatment chest radiographs in 4-6 weeks. Aortic atherosclerosis. Electronically Signed   By: Ilona Sorrel M.D.   On: 04/18/2016 16:52    EKG: pending   Assessment/Plan Principal Problem:   Sepsis due to pneumonia ([place]), mid to lower left and right lung area - pt met sepsis criteria on admission - pneumonia and sepsis order set in place - pt already started on Zithromax and Rocephin in ED and will continue same regimen  - sputum, blood cultures sent - follow up on lactic acid, CBC in AM - keep on IVF and reassess need to continue IVF in next 24 hours - keep pt in SDU  Active Problems:   Acute on Chronic respiratory failure (HCC) - secondary to multilobar  PNA and acute exacerbation of COPD - treat PNA as noted above - for COPD provide bronchodilators scheduled and as needed - place on solumedrol with planned taper as clinically indicated  - use antitussives as needed     Essential hypertension - resume home medical regimen    DM type II - continue SSI and hold oral home regimen until oral intake improves     Smoker - smoking cassation discussed     Hypokalemia - supplemented in ED - BMP In AM   DVT prophylaxis: Lovenox SQ Code Status: Full  Family Communication: Pt and family updated at bedside Disposition Plan: home when medically stable  Consults called: none  Admission status: inpatient   Faye Ramsay MD [facility] Pager [phone]  If 7PM-7AM, please contact night-coverage [email or web address] Password [facility]  04/18/2016, 5:41 PM

## 2016-04-18 NOTE — ED Notes (Signed)
Respiratory called to evaluate pt

## 2016-04-18 NOTE — Progress Notes (Signed)
CRITICAL VALUE ALERT  Critical value received:  Lactic Acid 4.7  Date of notification:  04/18/2016  Time of notification:  2235  Critical value read back:Yes.    Nurse who received alert:  Brien Mates RN  MD notified (1st page):  Lamar Blinks NP  Time of first page:  2236  MD notified (2nd page):  Time of second page:  Responding MD:  Lamar Blinks NP  Time MD responded:  3888

## 2016-04-18 NOTE — H&P (Signed)
04/18/2016 Patient transfer from the emergency room to 4E25 she was alert and oriented. Patient have bruises on bilateral arms, crack of sacrum is pink it blanches. Central monitor and Elink was made aware patient was here. She receive CHG and MRSA swab. [facility] RN.

## 2016-04-18 NOTE — ED Provider Notes (Addendum)
[facility] DEPT Provider Note   CSN: [id number] Arrival date & time: 04/18/16  1540     History   Chief Complaint Chief Complaint  Patient presents with  . Shortness of Breath    HPI MAUDRY ZEIDAN is a 71 y.o. female.  Patient w hx copd, presents c/o progressive worsening sob x 1 week. Sob severe, persistent, worse today, transient relief w mdi.   On home 3 liters. +smoker. c/o non prod cough, episodic. No chest pain or discomfort. No leg pain or swelling. Denies sore throat or runny nose. Had low grade fever yesterday.   The history is provided by the patient.  Shortness of Breath  Associated symptoms include a fever, cough and wheezing. Pertinent negatives include no headaches, no sore throat, no neck pain, no chest pain, no abdominal pain and no rash.    Past Medical History:  Diagnosis Date  . COPD (chronic obstructive pulmonary disease) ([place])   . Diabetes mellitus   . Diastolic dysfunction    history  . Hyperlipidemia   . Hypertension     Patient Active Problem List   Diagnosis Date Noted  . COPD (chronic obstructive pulmonary disease) ([place]) 12/04/2010  . Smoker 12/04/2010  . HYPERLIPIDEMIA 01/31/2008  . HYPERTENSION 01/31/2008  . Chronic respiratory failure ([place]) 01/31/2008    Past Surgical History:  Procedure Laterality Date  . TUBAL LIGATION      OB History    No data available       Home Medications    Prior to Admission medications   Medication Sig Start Date End Date Taking? Authorizing Provider  albuterol (PROAIR HFA) 108 (90 BASE) MCG/ACT inhaler Inhale 2 puffs into the lungs every 4 (four) hours as needed. Patient taking differently: Inhale 2 puffs into the lungs every 4 (four) hours as needed for shortness of breath.  01/19/11  Yes Tammy S Parrett, NP  amLODipine (NORVASC) 5 MG tablet Take 5 mg by mouth daily.   Yes Historical Provider, MD  amLODipine-valsartan (EXFORGE) 5-160 MG tablet Take 1 tablet by mouth daily.   Yes  Historical Provider, MD  aspirin EC 81 MG tablet Take 81 mg by mouth daily.   Yes Historical Provider, MD  atorvastatin (LIPITOR) 20 MG tablet Take 1 tablet (20 mg total) by mouth daily. Patient taking differently: Take 20 mg by mouth at bedtime.  01/19/11  Yes Tammy S Parrett, NP  budesonide-formoterol (SYMBICORT) 160-4.5 MCG/ACT inhaler Inhale 2 puffs into the lungs 2 (two) times daily.     Yes Historical Provider, MD  diazepam (VALIUM) 5 MG tablet Take 1/2 tab twice daily as needed foranxiety Patient taking differently: Take 5 mg by mouth daily as needed (anxiety when leaving the house).  01/20/11  Yes Tammy S Parrett, NP  diphenhydrAMINE (BENADRYL) 25 MG tablet Take 50 mg by mouth at bedtime.    Yes Historical Provider, MD  exenatide (BYETTA) 10 MCG/0.04ML SOPN injection Inject 10 mcg into the skin 2 (two) times daily with a meal.   Yes Historical Provider, MD  ibuprofen (ADVIL,MOTRIN) 200 MG tablet Take 400 mg by mouth every 6 (six) hours as needed for headache (pain).   Yes Historical Provider, MD  Multiple Vitamin (MULTIVITAMIN WITH MINERALS) TABS tablet Take 1 tablet by mouth daily.   Yes Historical Provider, MD  omeprazole (PRILOSEC) 20 MG capsule Take 1 tablet by mouth daily. 11/18/10  Yes Historical Provider, MD  OXYGEN Inhale 2 L into the lungs continuous.    Yes Historical Provider, MD  PARoxetine (PAXIL) 30 MG tablet Take 30 mg by mouth daily.    Yes Historical Provider, MD  Phenylephrine-Ibuprofen (ADVIL SINUS CONGESTION & PAIN) 10-200 MG TABS Take 1 tablet by mouth daily as needed (sinus congestion).    Yes Historical Provider, MD  sitaGLIPtan-metformin (JANUMET) 50-1000 MG per tablet Take 1 tablet by mouth 2 (two) times daily with a meal.     Yes Historical Provider, MD    Family History Family History  Problem Relation Age of Onset  . Heart disease Father   . Lung cancer Father     Social History Social History  Substance Use Topics  . Smoking status: Current Every Day  Smoker    Packs/day: 0.50  . Smokeless tobacco: Never Used  . Alcohol use Not on file     Allergies   Patient has no known allergies.   Review of Systems Review of Systems  Constitutional: Positive for fever. Negative for chills.  HENT: Negative for sore throat.   Eyes: Negative for redness.  Respiratory: Positive for cough, shortness of breath and wheezing.   Cardiovascular: Negative for chest pain.  Gastrointestinal: Negative for abdominal pain.  Genitourinary: Negative for flank pain.  Musculoskeletal: Negative for back pain and neck pain.  Skin: Negative for rash.  Neurological: Negative for headaches.  Hematological: Does not bruise/bleed easily.  Psychiatric/Behavioral: Negative for confusion.     Physical Exam Updated Vital Signs BP (!) 123/106 (BP Location: Right Arm)   Pulse (!) 113   Temp 97.6 F (36.4 C) (Axillary) Comment (Src): pt on neb treatment  Resp (!) 25   SpO2 90%   Physical Exam  Constitutional: She appears well-developed and well-nourished.  Reps distress.  HENT:  Mouth/Throat: Oropharynx is clear and moist.  Eyes: Conjunctivae are normal. No scleral icterus.  Neck: Neck supple. No JVD present. No tracheal deviation present. No thyromegaly present.  Cardiovascular: Normal rate, normal heart sounds and intact distal pulses.  Exam reveals no gallop and no friction rub.   No murmur heard. Tachycardic.   Pulmonary/Chest: No stridor. She is in respiratory distress. She has wheezes. She has rales.  Rhonchi bil.   Abdominal: Soft. Normal appearance and bowel sounds are normal. She exhibits no distension. There is no tenderness.  Genitourinary:  Genitourinary Comments: No cva tenderness  Musculoskeletal: She exhibits no edema or tenderness.  Neurological: She is alert.  Skin: Skin is warm and dry. No rash noted.  Psychiatric: She has a normal mood and affect.  Nursing note and vitals reviewed.    ED Treatments / Results  Labs (all labs  ordered are listed, but only abnormal results are displayed)  Results for orders placed or performed during the hospital encounter of 04/18/16  CBC WITH DIFFERENTIAL  Result Value Ref Range   WBC 21.3 (H) 4.0 - 10.5 K/uL   RBC 4.44 3.87 - 5.11 MIL/uL   Hemoglobin 11.0 (L) 12.0 - 15.0 g/dL   HCT 34.6 (L) 36.0 - 46.0 %   MCV 77.9 (L) 78.0 - 100.0 fL   MCH 24.8 (L) 26.0 - 34.0 pg   MCHC 31.8 30.0 - 36.0 g/dL   RDW 16.5 (H) 11.5 - 15.5 %   Platelets 320 150 - 400 K/uL   Neutrophils Relative % 89 %   Neutro Abs 18.8 (H) 1.7 - 7.7 K/uL   Lymphocytes Relative 2 %   Lymphs Abs 0.5 (L) 0.7 - 4.0 K/uL   Monocytes Relative 9 %   Monocytes Absolute 1.9 (H) 0.1 -  1.0 K/uL   Eosinophils Relative 0 %   Eosinophils Absolute 0.0 0.0 - 0.7 K/uL   Basophils Relative 0 %   Basophils Absolute 0.0 0.0 - 0.1 K/uL  I-stat troponin, ED (not at [facility], [facility])  Result Value Ref Range   Troponin i, poc 0.00 0.00 - 0.08 ng/mL   Comment 3           Dg Chest 2 View  Result Date: 04/07/2016 CLINICAL DATA:  Shortness of breath and productive cough for the past week. History of COPD and diabetes. Current smoker. EXAM: CHEST  2 VIEW COMPARISON:  PA and lateral chest x-ray of December 09, 2014 FINDINGS: The lungs are adequately inflated. The interstitial markings are coarse though stable. There is no alveolar infiltrate or pleural effusion. The heart and pulmonary vascularity are normal. The mediastinum is normal in width. There is calcification in the wall of the thoracic aorta. The bony thorax exhibits no acute abnormality. IMPRESSION: There is no pneumonia nor CHF nor other acute cardiopulmonary abnormality. Stable chronic bronchitic changes. Thoracic aortic atherosclerosis. Electronically Signed   By: David  Martinique M.D.   On: 04/07/2016 16:09   Ct Angio Chest Pe W Or Wo Contrast  Result Date: 04/07/2016 CLINICAL DATA:  71 year old with an approximate 3-4 day history of progressively worsening shortness of breath.  Hypoxia. Current history of diabetes, hypertension, COPD and diastolic dysfunction. EXAM: CT ANGIOGRAPHY CHEST WITH CONTRAST TECHNIQUE: Multidetector CT imaging of the chest was performed using the standard protocol during bolus administration of intravenous contrast. Multiplanar CT image reconstructions and MIPs were obtained to evaluate the vascular anatomy. CONTRAST:  80 mL Isovue 370 IV. COMPARISON:  No prior CT. Multiple prior chest x-rays, including earlier today. FINDINGS: Cardiovascular: Contrast opacification of the pulmonary arteries is good. Respiratory motion blurred images of the lungs throughout the examination. Overall, the study is of good technical quality. No filling defects within either main pulmonary artery or their branches in either lung to suggest pulmonary embolism. Heart upper normal in size to slightly enlarged. Left ventricular hypertrophy. Moderate three-vessel coronary atherosclerosis. No pericardial effusion. Prominent epicardial fat. Severe atherosclerosis involving the thoracic and upper abdominal aorta without aneurysm or dissection. Atherosclerosis involving the proximal great vessels with a possible hemodynamically significant stenosis at the origin of the left subclavian artery. Mediastinum/Nodes: No pathologically enlarged mediastinal, hilar or axillary lymph nodes. No mediastinal masses. Normal-appearing esophagus. Visualized thyroid gland unremarkable. Lungs/Pleura: Emphysematous changes in both lungs. Patchy ground-glass opacities in the right middle lobe. No confluent airspace consolidation. Scarring in the lingula. Pleuroparenchymal scarring in the apices, right greater than left. No pleural effusions. Severe bronchial wall thickening diffusely throughout both lungs, involving all 5 lobes, with narrowing of the right lower lobe bronchus and the left upper lobe bronchus. Upper Abdomen: Relative enlargement of the left lobe of liver compared to the right lobe. Atherosclerosis  at the origin of the celiac and SMA without visible stenosis. Visualized upper abdomen otherwise unremarkable. Musculoskeletal: Osseous demineralization. Mild-to-moderate diffuse thoracic spondylosis. Degenerative changes involving the visualized lower cervical spine. Review of the MIP images confirms the above findings. IMPRESSION: 1. No evidence of pulmonary embolism. 2. Severe generalized bronchial wall thickening with narrowing of the right lower lobe bronchus and the left upper lobe bronchus. 3. COPD/emphysema. Patchy ground-glass opacities in the right middle lobe are likely inflammatory. No acute cardiopulmonary disease otherwise. 4. Query hepatic cirrhosis, as there is relative enlargement of the left lobe of the liver compared to the right lobe. 5. Generalized atherosclerosis,  including three-vessel coronary atherosclerosis. Possible hemodynamically significant stenosis at the origin of the left subclavian artery. Electronically Signed   By: Evangeline Dakin M.D.   On: 04/07/2016 20:00   Dg Chest Port 1 View  Result Date: 04/18/2016 CLINICAL DATA:  Cough.  Dyspnea. EXAM: PORTABLE CHEST 1 VIEW COMPARISON:  04/07/2016 chest radiograph. FINDINGS: Stable cardiomediastinal silhouette with top-normal heart size and aortic atherosclerosis. No pneumothorax. No pleural effusion. Patchy consolidation in the peripheral mid to lower right lung and at the left lung base, which appears new. IMPRESSION: New patchy consolidation in the peripheral mid to lower right lung and at the left lung base, suspicious for multilobar pneumonia. Recommend follow-up PA and lateral post treatment chest radiographs in 4-6 weeks. Aortic atherosclerosis. Electronically Signed   By: Ilona Sorrel M.D.   On: 04/18/2016 16:52    EKG  EKG Interpretation  Date/Time:  Sunday April 18 2016 15:48:18 EDT Ventricular Rate:  109 PR Interval:    QRS Duration: 84 QT Interval:  339 QTC Calculation: 457 R Axis:   89 Text Interpretation:   Sinus tachycardia Multiform ventricular premature complexes Nonspecific ST abnormality Borderline repolarization abnormality Confirmed by Ashok Cordia  MD, Lennette Bihari ([id number]) on 04/18/2016 4:00:33 PM       Radiology Dg Chest Port 1 View  Result Date: 04/18/2016 CLINICAL DATA:  Cough.  Dyspnea. EXAM: PORTABLE CHEST 1 VIEW COMPARISON:  04/07/2016 chest radiograph. FINDINGS: Stable cardiomediastinal silhouette with top-normal heart size and aortic atherosclerosis. No pneumothorax. No pleural effusion. Patchy consolidation in the peripheral mid to lower right lung and at the left lung base, which appears new. IMPRESSION: New patchy consolidation in the peripheral mid to lower right lung and at the left lung base, suspicious for multilobar pneumonia. Recommend follow-up PA and lateral post treatment chest radiographs in 4-6 weeks. Aortic atherosclerosis. Electronically Signed   By: Ilona Sorrel M.D.   On: 04/18/2016 16:52    Procedures Procedures (including critical care time)  Medications Ordered in ED Medications  albuterol (PROVENTIL) (2.5 MG/3ML) 0.083% nebulizer solution 5 mg (not administered)  ipratropium (ATROVENT) nebulizer solution 0.5 mg (not administered)  methylPREDNISolone sodium succinate (SOLU-MEDROL) 125 mg/2 mL injection 80 mg (not administered)  cefTRIAXone (ROCEPHIN) 1 g in dextrose 5 % 50 mL IVPB (not administered)  azithromycin (ZITHROMAX) 500 mg in dextrose 5 % 250 mL IVPB (not administered)     Initial Impression / Assessment and Plan / ED Course  I have reviewed the triage vital signs and the nursing notes.  Pertinent labs & imaging results that were available during my care of the patient were reviewed by me and considered in my medical decision making (see chart for details).  Iv ns. Continuous pulse ox and monitor. o2 [place].   Albuterol and atrovent neb. Solumedrol iv.   Blood cultures. Lactate. Labs.  Additional albuterol neb.  Iv abx after cultures sent.  Hospitalists  consulted for admission.   Reviewed nursing notes and prior charts for additional history.     Final Clinical Impressions(s) / ED Diagnoses   Final diagnoses:  Community acquired pneumonia, unspecified laterality  COPD exacerbation ([place])  SOB (shortness of breath)  Hypokalemia  Leukocytosis, unspecified type    New Prescriptions New Prescriptions   No medications on file     Lajean Saver, MD 04/21/16 [place], MD 04/29/16 [phone]

## 2016-04-18 NOTE — ED Notes (Signed)
Attempted report 

## 2016-04-19 ENCOUNTER — Encounter (HOSPITAL_COMMUNITY): Payer: Self-pay

## 2016-04-19 DIAGNOSIS — J441 Chronic obstructive pulmonary disease with (acute) exacerbation: Secondary | ICD-10-CM

## 2016-04-19 DIAGNOSIS — A419 Sepsis, unspecified organism: Principal | ICD-10-CM

## 2016-04-19 DIAGNOSIS — J9621 Acute and chronic respiratory failure with hypoxia: Secondary | ICD-10-CM

## 2016-04-19 DIAGNOSIS — J189 Pneumonia, unspecified organism: Secondary | ICD-10-CM

## 2016-04-19 LAB — RESPIRATORY PANEL BY PCR
ADENOVIRUS-RVPPCR: NOT DETECTED
BORDETELLA PERTUSSIS-RVPCR: NOT DETECTED
CORONAVIRUS HKU1-RVPPCR: NOT DETECTED
CORONAVIRUS NL63-RVPPCR: NOT DETECTED
Chlamydophila pneumoniae: NOT DETECTED
Coronavirus 229E: NOT DETECTED
Coronavirus OC43: NOT DETECTED
Influenza A: NOT DETECTED
Influenza B: NOT DETECTED
METAPNEUMOVIRUS-RVPPCR: NOT DETECTED
Mycoplasma pneumoniae: NOT DETECTED
PARAINFLUENZA VIRUS 2-RVPPCR: NOT DETECTED
PARAINFLUENZA VIRUS 3-RVPPCR: NOT DETECTED
Parainfluenza Virus 1: NOT DETECTED
Parainfluenza Virus 4: NOT DETECTED
RHINOVIRUS / ENTEROVIRUS - RVPPCR: NOT DETECTED
Respiratory Syncytial Virus: NOT DETECTED

## 2016-04-19 LAB — CBC
HCT: 33.3 % — ABNORMAL LOW (ref 36.0–46.0)
HEMOGLOBIN: 10.7 g/dL — AB (ref 12.0–15.0)
MCH: 24.9 pg — ABNORMAL LOW (ref 26.0–34.0)
MCHC: 32.1 g/dL (ref 30.0–36.0)
MCV: 77.6 fL — AB (ref 78.0–100.0)
PLATELETS: 346 10*3/uL (ref 150–400)
RBC: 4.29 MIL/uL (ref 3.87–5.11)
RDW: 16.6 % — ABNORMAL HIGH (ref 11.5–15.5)
WBC: 22.9 10*3/uL — AB (ref 4.0–10.5)

## 2016-04-19 LAB — GLUCOSE, CAPILLARY
GLUCOSE-CAPILLARY: 198 mg/dL — AB (ref 65–99)
GLUCOSE-CAPILLARY: 166 mg/dL — AB (ref 65–99)
Glucose-Capillary: 172 mg/dL — ABNORMAL HIGH (ref 65–99)
Glucose-Capillary: 228 mg/dL — ABNORMAL HIGH (ref 65–99)
Glucose-Capillary: 216 mg/dL — ABNORMAL HIGH (ref 65–99)

## 2016-04-19 LAB — CG4 I-STAT (LACTIC ACID): Lactic Acid, Venous: 1.61 mmol/L (ref 0.5–1.9)

## 2016-04-19 LAB — BASIC METABOLIC PANEL
ANION GAP: 14 (ref 5–15)
BUN: 22 mg/dL — ABNORMAL HIGH (ref 6–20)
CHLORIDE: 86 mmol/L — AB (ref 101–111)
CO2: 31 mmol/L (ref 22–32)
CREATININE: 0.8 mg/dL (ref 0.44–1.00)
Calcium: 7.4 mg/dL — ABNORMAL LOW (ref 8.9–10.3)
GFR calc non Af Amer: >60 mL/min (ref >60)
Glucose, Bld: 187 mg/dL — ABNORMAL HIGH (ref 65–99)
Potassium: 3.4 mmol/L — ABNORMAL LOW (ref 3.5–5.1)
SODIUM: 131 mmol/L — AB (ref 135–145)

## 2016-04-19 LAB — HIV ANTIBODY (ROUTINE TESTING W REFLEX): HIV SCREEN 4TH GENERATION: NONREACTIVE

## 2016-04-19 LAB — INFLUENZA PANEL BY PCR (TYPE A & B)
Influenza A By PCR: NEGATIVE
Influenza B By PCR: NEGATIVE

## 2016-04-19 LAB — LACTIC ACID, PLASMA: Lactic Acid, Venous: 1.4 mmol/L (ref 0.5–1.9)

## 2016-04-19 MED ORDER — ORAL CARE MOUTH RINSE
15.0000 mL | Freq: Two times a day (BID) | OROMUCOSAL | Status: DC
  Administered 2016-04-19 – 2016-04-23 (×9): 15 mL via OROMUCOSAL

## 2016-04-19 MED ORDER — ARFORMOTEROL TARTRATE 15 MCG/2ML IN NEBU
15.0000 ug | INHALATION_SOLUTION | Freq: Two times a day (BID) | RESPIRATORY_TRACT | Status: DC
  Administered 2016-04-19 – 2016-04-23 (×9): 15 ug via RESPIRATORY_TRACT
  Filled 2016-04-19 (×9): qty 2

## 2016-04-19 MED ORDER — ENSURE ENLIVE PO LIQD
237.0000 mL | Freq: Two times a day (BID) | ORAL | Status: DC
  Administered 2016-04-21 – 2016-04-23 (×5): 237 mL via ORAL

## 2016-04-19 MED ORDER — BUDESONIDE 0.25 MG/2ML IN SUSP
0.2500 mg | Freq: Two times a day (BID) | RESPIRATORY_TRACT | Status: DC
  Administered 2016-04-19 – 2016-04-21 (×5): 0.25 mg via RESPIRATORY_TRACT
  Filled 2016-04-19 (×5): qty 2

## 2016-04-19 MED ORDER — GUAIFENESIN ER 600 MG PO TB12
1200.0000 mg | ORAL_TABLET | Freq: Two times a day (BID) | ORAL | Status: DC
  Administered 2016-04-19 – 2016-04-23 (×9): 1200 mg via ORAL
  Filled 2016-04-19 (×9): qty 2

## 2016-04-19 MED ORDER — GUAIFENESIN-DM 100-10 MG/5ML PO SYRP
5.0000 mL | ORAL_SOLUTION | ORAL | Status: DC | PRN
  Administered 2016-04-19 – 2016-04-20 (×3): 5 mL via ORAL
  Filled 2016-04-19 (×3): qty 5

## 2016-04-19 NOTE — Progress Notes (Signed)
Patient ID: [name], female   DOB: Oct 24, 1945, 71 y.o.   MRN: [id number]    PROGRESS NOTE    [name]  GEZ:[id number] DOB: 1945-12-20 DOA: 04/18/2016  PCP: Pcp Not In System   Brief Narrative:  71 y.o. female with medical history significant of HTN, COPD, smoker, DM type II, presented due to progressively worsening dyspnea, with exertion and at rest, associated with subjective fevers, chills, cough productive of yellow sputum, poor oral intake. Pt also reports occasional chest tightness that occurs with coughing spells, no chest pain at this time. Pt denies any specific abd and urinary concerns.   ED Course: Pt hemodynamically stable, VS notable for HR up to 113, RR up to 25, BP stable, blood work notable for WBC 21 K, CXR notable for multilobar PNA. Pt started on Zithromax and Rocephin. TRH asked to admit for further evaluation.   Assessment & Plan:  Principal Problem:   Sepsis due to pneumonia ([place]), mid to lower left and right lung area - pt met sepsis criteria on admission - pneumonia and sepsis order set in place - pt already started on Zithromax and Rocephin in ED and will continue same regimen, day #2 - sputum, blood cultures sent, follow up on results  - PCCM consulted for assistance - keep in SDU   Active Problems:   Acute on Chronic respiratory failure (HCC) - secondary to multilobar PNA and acute exacerbation of COPD - treat PNA as noted above - for COPD provide bronchodilators scheduled and as needed - keep on solumedrol  - use antitussives as needed  - appreciate PCCM assistance     Hyponatremia - pre renal. Improving  - monitor for now  - BMP in AM    Essential hypertension - resume home medical regimen    DM type II - continue SSI and hold oral home regimen until oral intake improves     Smoker - smoking cassation discussed     Hypokalemia - supplement with K-dur  - BMP In AM  DVT prophylaxis: Lovenox SQ Code Status:  Full Family Communication: Patient at bedside  Disposition Plan: to be determined   Consultants:   PCCM  Procedures:   None  Antimicrobials:   Zithromax 4/15 -->  Rocephin 4/15 -->   Subjective: Pt still with dyspnea this AM. She says she feels better compared to yesterday.   Objective: Vitals:   04/18/16 2005 04/19/16 0003 04/19/16 0350 04/19/16 0845  BP: (!) 141/101 106/60 (!) 152/68 (!) 109/98  Pulse: (!) 105 (!) 101 100 (!) 104  Resp: (!) 24  (!) 23 (!) 22  Temp: 97.5 F (36.4 C) 98.3 F (36.8 C) 98.3 F (36.8 C) 98.1 F (36.7 C)  TempSrc: Axillary Oral Oral Oral  SpO2: (!) 89% 91% 96% 96%  Weight:      Height:        Intake/Output Summary (Last 24 hours) at 04/19/16 0913 Last data filed at 04/19/16 0600  Gross per 24 hour  Intake           1997.4 ml  Output              200 ml  Net           1797.4 ml   Filed Weights   04/18/16 2000  Weight: 90.7 kg (200 lb)    Examination:  General exam: Appears calm and comfortable  Respiratory system: Course breath sounds bilaterally with diffuse bilateral wheezing  Cardiovascular system: tachycardic, No JVD,  murmurs, rubs, gallops or clicks. No pedal edema. Gastrointestinal system: Abdomen is nondistended, soft and nontender. No organomegaly or masses felt.  Central nervous system: Alert and oriented. No focal neurological deficits. Extremities: Symmetric 5 x 5 power.  Data Reviewed: I have personally reviewed following labs and imaging studies  CBC:  Recent Labs Lab 04/18/16 1632 04/19/16 0221  WBC 21.3* 22.9*  NEUTROABS 18.8*  --   HGB 11.0* 10.7*  HCT 34.6* 33.3*  MCV 77.9* 77.6*  PLT 320 435   Basic Metabolic Panel:  Recent Labs Lab 04/18/16 1632 04/19/16 0221  NA 129* 131*  K 3.1* 3.4*  CL 82* 86*  CO2 32 31  GLUCOSE 147* 187*  BUN 21* 22*  CREATININE 0.93 0.80  CALCIUM 7.9* 7.4*   Liver Function Tests:  Recent Labs Lab 04/18/16 1632  AST 30  ALT 34  ALKPHOS 126  BILITOT  0.8  PROT 6.5  ALBUMIN 2.5*   Coagulation Profile:  Recent Labs Lab 04/18/16 1946  INR 1.18   CBG:  Recent Labs Lab 04/18/16 2127 04/19/16 0347 04/19/16 0844  GLUCAP 381* 172* 198*   Recent Results (from the past 240 hour(s))  MRSA PCR Screening     Status: None   Collection Time: 04/18/16  6:53 PM  Result Value Ref Range Status   MRSA by PCR NEGATIVE NEGATIVE Final    Radiology Studies: Dg Chest Port 1 View Result Date: 04/18/2016 New patchy consolidation in the peripheral mid to lower right lung and at the left lung base, suspicious for multilobar pneumonia. Recommend follow-up PA and lateral post treatment chest radiographs in 4-6 weeks. Aortic atherosclerosis.   Scheduled Meds: . amLODipine  10 mg Oral Daily  . arformoterol  15 mcg Nebulization BID  . aspirin EC  81 mg Oral Daily  . azithromycin  500 mg Intravenous Q24H  . budesonide (PULMICORT) nebulizer solution  0.25 mg Nebulization BID  . cefTRIAXone (ROCEPHIN)  IV  1 g Intravenous Q24H  . diphenhydrAMINE  50 mg Oral QHS  . enoxaparin (LOVENOX) injection  40 mg Subcutaneous Q24H  . exenatide  10 mcg Subcutaneous BID WC  . guaiFENesin  1,200 mg Oral BID  . insulin aspart  0-9 Units Subcutaneous TID WC  . ipratropium-albuterol  3 mL Nebulization Q6H  . irbesartan  150 mg Oral Daily  . mouth rinse  15 mL Mouth Rinse BID  . methylPREDNISolone (SOLU-MEDROL) injection  60 mg Intravenous Q6H  . pantoprazole  40 mg Oral Daily  . PARoxetine  30 mg Oral Daily   Continuous Infusions: . sodium chloride 75 mL/hr at 04/18/16 2002    LOS: 1 day   Time spent: 20 minutes   Faye Ramsay, MD [facility] Pager [phone]  If 7PM-7AM, please contact night-coverage [email or web address] Password [facility] 04/19/2016, 9:13 AM

## 2016-04-19 NOTE — Progress Notes (Addendum)
Inpatient Diabetes Program Recommendations  AACE/ADA: New Consensus Statement on Inpatient Glycemic Control (2015)  Target Ranges:  Prepandial:   less than 140 mg/dL      Peak postprandial:   less than 180 mg/dL (1-2 hours)      Critically ill patients:  140 - 180 mg/dL   Results for [name], [name] (MRN [id number]) as of 04/19/2016 11:33  Ref. Range 04/18/2016 21:27 04/19/2016 03:47 04/19/2016 08:44  Glucose-Capillary Latest Ref Range: 65 - 99 mg/dL 381 (H) 172 (H) 198 (H)   Review of Glycemic Control  Diabetes history: DM2 Outpatient Diabetes medications: Byetta 10 mcg BID with meals, Janumet 50-1000 mg BID Current orders for Inpatient glycemic control: Byetta 10 mcg BID with meals, Novolog 0-9 units TID with meals  Inpatient Diabetes Program Recommendations: Correction (SSI): Please consider ordering Novolog 0-5 units QHS for bedtime correction scale. A1C: Please consider ordering an A1C to evaluate glycemic control over the past 2-3 months. Injectable Outpatient DM medication: Byetta not on formulary at hospital and per chart has not been given. MD may want to consider discontinuing Byetta while inpatient. Insulin-Meal Coverage: If post prandial glucose is consistently elevated with steroids, may need to consider ordering Novolog meal coverage (in addition to Novolog correction scale). Diet: Please discontinue Regular diet and order Carb Modified diet.  Thanks, Barnie Alderman, RN, MSN, CDE Diabetes Coordinator Inpatient Diabetes Program [phone] (Team Pager from 8am to 5pm)

## 2016-04-19 NOTE — Progress Notes (Signed)
Pt states that she does not need a nicoderm patch. Also states that she is going to quit smoking. Says "I just can't handle this feeling anymore" referring to her respiratory issues.

## 2016-04-19 NOTE — Consult Note (Signed)
PCCM Consult Note  Admission date: 04/18/2016  CC: Shortness of breath  HPI: Consulted on 04/19/16 by Dr. Doyle Askew with [facility] to assess dyspnea.  71 yo female smoker presented to ER with dyspnea, cough, and chest congestion for 1 week prior to admission.  She was noted be hypoxic in ER.  She reported feeling feverish with chills, and cough with yellow sputum.  She was found to have pneumonia and concern for COPD exacerbation.  She was started on ABx, solumedrol, oxygen, and BDs.  She was previously followed by Dr. Melvyn Novas.  She uses 2 liters oxygen at baseline.  She gets short of breath after walking few steps at baseline.  She denies recent sick exposures.  She has been smoking 1 ppd.    She denies chest pain, abdominal pain, nausea, diarrhea, sinus congestion, ear pain, headache, or sore throat.  She has been wheezing.  Denies skin rash or leg swelling.  She  has a past medical history of COPD (chronic obstructive pulmonary disease) ([place]); Diabetes mellitus; Diastolic dysfunction; Hyperlipidemia; and Hypertension.  She  has a past surgical history that includes Tubal ligation.  Her family history includes Heart disease in her father; Lung cancer in her father.  She  reports that she has been smoking.  She has been smoking about 0.50 packs per day. She has never used smokeless tobacco.  No Known Allergies  No current facility-administered medications on file prior to encounter.    Current Outpatient Prescriptions on File Prior to Encounter  Medication Sig  . albuterol (PROAIR HFA) 108 (90 BASE) MCG/ACT inhaler Inhale 2 puffs into the lungs every 4 (four) hours as needed. (Patient taking differently: Inhale 2 puffs into the lungs every 4 (four) hours as needed for shortness of breath. )  . aspirin EC 81 MG tablet Take 81 mg by mouth daily.  Marland Kitchen atorvastatin (LIPITOR) 20 MG tablet Take 1 tablet (20 mg total) by mouth daily. (Patient taking differently: Take 20 mg by mouth at bedtime. )  .  budesonide-formoterol (SYMBICORT) 160-4.5 MCG/ACT inhaler Inhale 2 puffs into the lungs 2 (two) times daily.    . diazepam (VALIUM) 5 MG tablet Take 1/2 tab twice daily as needed foranxiety (Patient taking differently: Take 5 mg by mouth daily as needed (anxiety when leaving the house). )  . diphenhydrAMINE (BENADRYL) 25 MG tablet Take 50 mg by mouth at bedtime.   Marland Kitchen omeprazole (PRILOSEC) 20 MG capsule Take 1 tablet by mouth daily.  . OXYGEN Inhale 2 L into the lungs continuous.   Marland Kitchen PARoxetine (PAXIL) 30 MG tablet Take 30 mg by mouth daily.   Marland Kitchen Phenylephrine-Ibuprofen (ADVIL SINUS CONGESTION & PAIN) 10-200 MG TABS Take 1 tablet by mouth daily as needed (sinus congestion).   . sitaGLIPtan-metformin (JANUMET) 50-1000 MG per tablet Take 1 tablet by mouth 2 (two) times daily with a meal.      ROS: 12 point ROS negative except above  Vital signs: BP (!) 152/68 (BP Location: Right Arm)   Pulse 100   Temp 98.3 F (36.8 C) (Oral)   Resp (!) 23   Ht 5\' 8"  (1.727 m)   Wt 200 lb (90.7 kg)   SpO2 96%   BMI 30.41 kg/m   Intake/output: I/O last 3 completed shifts: In: 1997.4 [I.V.:747.5; IV Piggyback:1249.9] Out: 200 [Urine:200]  General: alert Neuro: normal strength, CN intact HEENT: pupils reactive, dry oral mucosa, no LAN Cardiac: regular, tachycardic, no murmur Chest: diffuse b/l wheezing Abd: soft, non tender, + BS Ext:  no edema Skin: no rashes   CMP Latest Ref Rng & Units 04/19/2016 04/18/2016 04/07/2016  Glucose 65 - 99 mg/dL 187(H) 147(H) 118(H)  BUN 6 - 20 mg/dL 22(H) 21(H) 6  Creatinine 0.44 - 1.00 mg/dL 0.80 0.93 0.51  Sodium 135 - 145 mmol/L 131(L) 129(L) 131(L)  Potassium 3.5 - 5.1 mmol/L 3.4(L) 3.1(L) 3.8  Chloride 101 - 111 mmol/L 86(L) 82(L) 88(L)  CO2 22 - 32 mmol/L 31 32 32  Calcium 8.9 - 10.3 mg/dL 7.4(L) 7.9(L) 8.9  Total Protein 6.5 - 8.1 g/dL - 6.5 6.8  Total Bilirubin 0.3 - 1.2 mg/dL - 0.8 0.8  Alkaline Phos 38 - 126 U/L - 126 65  AST 15 - 41 U/L - 30 22  ALT  14 - 54 U/L - 34 16     CBC Latest Ref Rng & Units 04/19/2016 04/18/2016 04/07/2016  WBC 4.0 - 10.5 K/uL 22.9(H) 21.3(H) 9.1  Hemoglobin 12.0 - 15.0 g/dL 10.7(L) 11.0(L) 11.2(L)  Hematocrit 36.0 - 46.0 % 33.3(L) 34.6(L) 35.3(L)  Platelets 150 - 400 K/uL 346 320 225     ABG    Component Value Date/Time   PHART 7.447 (H) 11/04/2009 1305   PCO2ART 43.9 11/04/2009 1305   PO2ART 62.4 (L) 11/04/2009 1305   HCO3 29.8 (H) 11/04/2009 1305   TCO2 31.2 11/04/2009 1305   O2SAT 93.7 11/04/2009 1305     CBG (last 3)   Recent Labs  04/18/16 2127 04/19/16 0347  GLUCAP 381* 172*     Imaging: Dg Chest Port 1 View  Result Date: 04/18/2016 CLINICAL DATA:  Cough.  Dyspnea. EXAM: PORTABLE CHEST 1 VIEW COMPARISON:  04/07/2016 chest radiograph. FINDINGS: Stable cardiomediastinal silhouette with top-normal heart size and aortic atherosclerosis. No pneumothorax. No pleural effusion. Patchy consolidation in the peripheral mid to lower right lung and at the left lung base, which appears new. IMPRESSION: New patchy consolidation in the peripheral mid to lower right lung and at the left lung base, suspicious for multilobar pneumonia. Recommend follow-up PA and lateral post treatment chest radiographs in 4-6 weeks. Aortic atherosclerosis. Electronically Signed   By: Ilona Sorrel M.D.   On: 04/18/2016 16:52     Studies: CT angio 04/07/16 >> atherosclerosis, emphysema, patchy GGO RML, lingula scar PFT 02/02/08 >> FEV1 1.21 (45%), FEV1% 51, TLC 5.12 (87%), DLCO 56%  Antibiotics: Rocephin 4/15 >> Zithromax 4/15 >>  Cultures: Influenza 4/15 >>  Pneumococcal Ag 4/15 >> Respiratory viral panel 4/15 >>  Lines/tubes:  Events: 4/15 Admit 4/16 Add prn Bipap  Summary: 71 yo female with acute on chronic respiratory failure from CAP with AECOPD.  Assessment/plan:  Acute on chronic hypoxic respiratory failure 2nd to CAP and AECOPD. Tobacco abuse. - Day 2 of Abx - continue solumedrol - scheduled BDs,  add pulmicort and brovana - bronchial hygiene - hold dulera for now - oxygen to keep SpO2 90 to 95% - prn Bipap  Sepsis with lactic acidosis. - improved - continue IV fluids - monitor hemodynamics  Hyponatremia. Hypokalemia. - f/u BMET  Anemia of critical illness. - f/u CBC  DM with steroid induced hyperglycemia. - SSI  Hx of HTN, HLD. - asa, amlodipine, avapro  Hx of depression. - paxil  Deconditioning. - will need PT assessment when more stable  DVT prophylaxis - lovenox SUP - protonix Nutrition - regular diet Goals of care - full code  Okay to keep in SDU for now.  Chesley Mires, MD [facility] Pulmonary/Critical Care 04/19/2016, 8:54 AM Pager:  [phone] After  3pm call: [phone]

## 2016-04-19 NOTE — Progress Notes (Signed)
Pt stated that she is unable to perform some activities of daily living due to her respiratory issues. Stated that her brother helps her and is the one who takes her to MD appointments, grocery shops for her, etc. May benefit from PT and/or OT.

## 2016-04-19 NOTE — Progress Notes (Signed)
Patient placed on BIPAP at this time for increased work of breathing, see flowsheet for settings

## 2016-04-20 ENCOUNTER — Inpatient Hospital Stay (HOSPITAL_COMMUNITY): Payer: Medicare HMO

## 2016-04-20 DIAGNOSIS — J9601 Acute respiratory failure with hypoxia: Secondary | ICD-10-CM

## 2016-04-20 LAB — CBC
HEMATOCRIT: 32.4 % — AB (ref 36.0–46.0)
HEMOGLOBIN: 10.2 g/dL — AB (ref 12.0–15.0)
MCH: 24.8 pg — AB (ref 26.0–34.0)
MCHC: 31.5 g/dL (ref 30.0–36.0)
MCV: 78.8 fL (ref 78.0–100.0)
PLATELETS: 350 10*3/uL (ref 150–400)
RBC: 4.11 MIL/uL (ref 3.87–5.11)
RDW: 16.6 % — ABNORMAL HIGH (ref 11.5–15.5)
WBC: 16.7 10*3/uL — AB (ref 4.0–10.5)

## 2016-04-20 LAB — GLUCOSE, CAPILLARY
GLUCOSE-CAPILLARY: 158 mg/dL — AB (ref 65–99)
Glucose-Capillary: 237 mg/dL — ABNORMAL HIGH (ref 65–99)
Glucose-Capillary: 196 mg/dL — ABNORMAL HIGH (ref 65–99)
Glucose-Capillary: 217 mg/dL — ABNORMAL HIGH (ref 65–99)

## 2016-04-20 LAB — BASIC METABOLIC PANEL
Anion gap: 11 (ref 5–15)
BUN: 14 mg/dL (ref 6–20)
CHLORIDE: 90 mmol/L — AB (ref 101–111)
CO2: 34 mmol/L — AB (ref 22–32)
Calcium: 7.8 mg/dL — ABNORMAL LOW (ref 8.9–10.3)
Creatinine, Ser: 0.61 mg/dL (ref 0.44–1.00)
GFR calc non Af Amer: >60 mL/min (ref >60)
Glucose, Bld: 213 mg/dL — ABNORMAL HIGH (ref 65–99)
POTASSIUM: 3.4 mmol/L — AB (ref 3.5–5.1)
SODIUM: 135 mmol/L (ref 135–145)

## 2016-04-20 LAB — STREP PNEUMONIAE URINARY ANTIGEN: STREP PNEUMO URINARY ANTIGEN: NEGATIVE

## 2016-04-20 MED ORDER — POTASSIUM CHLORIDE CRYS ER 20 MEQ PO TBCR
40.0000 meq | EXTENDED_RELEASE_TABLET | Freq: Once | ORAL | Status: AC
  Administered 2016-04-20: 40 meq via ORAL
  Filled 2016-04-20: qty 2

## 2016-04-20 MED ORDER — INSULIN GLARGINE 100 UNIT/ML ~~LOC~~ SOLN
10.0000 [IU] | Freq: Every day | SUBCUTANEOUS | Status: DC
  Administered 2016-04-20 – 2016-04-21 (×2): 10 [IU] via SUBCUTANEOUS
  Filled 2016-04-20 (×2): qty 0.1

## 2016-04-20 MED ORDER — INSULIN ASPART 100 UNIT/ML ~~LOC~~ SOLN
4.0000 [IU] | Freq: Three times a day (TID) | SUBCUTANEOUS | Status: DC
  Administered 2016-04-20 – 2016-04-23 (×9): 4 [IU] via SUBCUTANEOUS

## 2016-04-20 MED ORDER — HYDRALAZINE HCL 20 MG/ML IJ SOLN: 5.0000 mg | INTRAMUSCULAR | Status: DC | PRN

## 2016-04-20 NOTE — Care Management Note (Signed)
Case Management Note  Patient Details  Name: [name] MRN: [id number] Date of Birth: 1945-11-21  Subjective/Objective:     From home, presents with sepsis, resp failure, hyponatremia,htn, dm, conts on 5 liters.  NCM will follow for dc needs.                Action/Plan:   Expected Discharge Date:                  Expected Discharge Plan:  Home/Self Care  In-House Referral:     Discharge planning Services  CM Consult  Post Acute Care Choice:    Choice offered to:     DME Arranged:    DME Agency:     HH Arranged:    HH Agency:     Status of Service:  In process, will continue to follow  If discussed at Long Length of Stay Meetings, dates discussed:    Additional Comments:  Zenon Mayo, RN 04/20/2016, 6:37 PM

## 2016-04-20 NOTE — Progress Notes (Addendum)
Patient ID: [name], female   DOB: Apr 22, 1945, 71 y.o.   MRN: [id number]    PROGRESS NOTE  [name]  HGD:[id number] DOB: Feb 09, 1945 DOA: 04/18/2016  PCP: Pcp Not In System   Brief Narrative:  71 y.o. female with medical history significant of HTN, COPD, smoker, DM type II, presented due to progressively worsening dyspnea, with exertion and at rest, associated with subjective fevers, chills, cough productive of yellow sputum, poor oral intake. Pt also reports occasional chest tightness that occurs with coughing spells, no chest pain at this time. Pt denies any specific abd and urinary concerns.   ED Course: Pt hemodynamically stable, VS notable for HR up to 113, RR up to 25, BP stable, blood work notable for WBC 21 K, CXR notable for multilobar PNA. Pt started on Zithromax and Rocephin. TRH asked to admit for further evaluation.   Major events since admission: 4/16 - has required BiPAP 4/17 - still with increased work of breathing but able to get off BiPAP and placed on [place]   Assessment & Plan:  Principal Problem:   Sepsis due to pneumonia ([place]), mid to lower left and right lung area - pt met sepsis criteria on admission - pneumonia and sepsis order set in place - pt already started on Zithromax and Rocephin in ED and will continue same regimen, day #3 - sputum, blood cultures sent, follow up on results  - PCCM consulted for assistance, appreciate help - keep in SDU given intermittent BiPAP requirement  - WBC is trending down, will repeat CBC in AM  Active Problems:   Acute on Chronic respiratory failure (HCC) - secondary to multilobar PNA and acute exacerbation of COPD - treat PNA as noted above - for COPD provide bronchodilators scheduled and as needed - keep on solumedrol  - use antitussives as needed  - appreciate PCCM assistance     Hyponatremia - pre renal - Improving and WNL this AM  - BMP in AM    Essential hypertension - resumed home medical  regimen - SBP still in 160's - will add hydralazine as needed     DM type II - continue SSI and hold oral home regimen until oral intake improves  - also on lantus 10 U QHS, added novolog 4 U TID  - check A1C    Smoker - smoking cassation discussed     Hypokalemia - supplement with K-dur  - BMP In AM  DVT prophylaxis: Lovenox SQ Code Status: Full Family Communication: Patient at bedside  Disposition Plan: not ready for discharge, needs to stay in SDU   Consultants:   PCCM  Procedures:   None  Antimicrobials:   Zithromax 4/15 -->  Rocephin 4/15 -->   Subjective: Pt still with dyspnea this AM. She says she feels better compared to yesterday.   Objective: Vitals:   04/20/16 0417 04/20/16 0600 04/20/16 0755 04/20/16 0803  BP: 133/63   (!) 165/85  Pulse: 92   96  Resp: (!) 22   (!) 26  Temp: 97.3 F (36.3 C) 98.1 F (36.7 C)  98.2 F (36.8 C)  TempSrc: Oral Oral  Oral  SpO2: 96%  92% 96%  Weight:      Height:        Intake/Output Summary (Last 24 hours) at 04/20/16 1123 Last data filed at 04/20/16 0900  Gross per 24 hour  Intake             1450 ml  Output  300 ml  Net             1150 ml   Filed Weights   04/18/16 2000  Weight: 90.7 kg (200 lb)    Examination:  General exam: Appears calm and comfortable  Respiratory system: Course breath sounds bilaterally with diffuse bilateral wheezing  Cardiovascular system: tachycardic, No JVD, murmurs, rubs, gallops or clicks. No pedal edema. Gastrointestinal system: Abdomen is nondistended, soft and nontender. No organomegaly or masses felt.  Central nervous system: Alert and oriented. No focal neurological deficits. Extremities: Symmetric 5 x 5 power.  Data Reviewed: I have personally reviewed following labs and imaging studies  CBC:  Recent Labs Lab 04/18/16 1632 04/19/16 0221 04/20/16 0229  WBC 21.3* 22.9* 16.7*  NEUTROABS 18.8*  --   --   HGB 11.0* 10.7* 10.2*  HCT 34.6* 33.3*  32.4*  MCV 77.9* 77.6* 78.8  PLT 320 346 676   Basic Metabolic Panel:  Recent Labs Lab 04/18/16 1632 04/19/16 0221 04/20/16 0229  NA 129* 131* 135  K 3.1* 3.4* 3.4*  CL 82* 86* 90*  CO2 32 31 34*  GLUCOSE 147* 187* 213*  BUN 21* 22* 14  CREATININE 0.93 0.80 0.61  CALCIUM 7.9* 7.4* 7.8*   Liver Function Tests:  Recent Labs Lab 04/18/16 1632  AST 30  ALT 34  ALKPHOS 126  BILITOT 0.8  PROT 6.5  ALBUMIN 2.5*   Coagulation Profile:  Recent Labs Lab 04/18/16 1946  INR 1.18   CBG:  Recent Labs Lab 04/19/16 0844 04/19/16 1227 04/19/16 1725 04/19/16 2131 04/20/16 0805  GLUCAP 198* 228* 216* 166* 237*   Recent Results (from the past 240 hour(s))  MRSA PCR Screening     Status: None   Collection Time: 04/18/16  6:53 PM  Result Value Ref Range Status   MRSA by PCR NEGATIVE NEGATIVE Final    Radiology Studies: Dg Chest Port 1 View Result Date: 04/18/2016 New patchy consolidation in the peripheral mid to lower right lung and at the left lung base, suspicious for multilobar pneumonia. Recommend follow-up PA and lateral post treatment chest radiographs in 4-6 weeks. Aortic atherosclerosis.   Scheduled Meds: . amLODipine  10 mg Oral Daily  . arformoterol  15 mcg Nebulization BID  . aspirin EC  81 mg Oral Daily  . budesonide (PULMICORT) nebulizer solution  0.25 mg Nebulization BID  . diphenhydrAMINE  50 mg Oral QHS  . enoxaparin (LOVENOX) injection  40 mg Subcutaneous Q24H  . feeding supplement (ENSURE ENLIVE)  237 mL Oral BID BM  . guaiFENesin  1,200 mg Oral BID  . insulin aspart  0-9 Units Subcutaneous TID WC  . insulin glargine  10 Units Subcutaneous Daily  . ipratropium-albuterol  3 mL Nebulization Q6H  . irbesartan  150 mg Oral Daily  . mouth rinse  15 mL Mouth Rinse BID  . methylPREDNISolone (SOLU-MEDROL) injection  60 mg Intravenous Q6H  . pantoprazole  40 mg Oral Daily  . PARoxetine  30 mg Oral Daily   Continuous Infusions: . azithromycin    .  cefTRIAXone (ROCEPHIN)  IV      LOS: 2 days   Time spent: 20 minutes   Faye Ramsay, MD [facility] Pager [phone]  If 7PM-7AM, please contact night-coverage [email or web address] Password [facility] 04/20/2016, 11:23 AM

## 2016-04-20 NOTE — Progress Notes (Signed)
Patient had run of SVT. Patient lying in bed talking to family. Denies any discomfort/pain. Dr. Doyle Askew made aware.

## 2016-04-20 NOTE — Progress Notes (Signed)
Nutrition Brief Note  Patient identified on the Malnutrition Screening Tool (MST) Report  Wt Readings from Last 15 Encounters:  04/18/16 200 lb (90.7 kg)  01/15/11 226 lb (102.5 kg)  12/04/10 231 lb 6.4 oz (105 kg)  01/31/08 213 lb (96.6 kg)   Body mass index is 30.41 kg/m. Patient meets criteria for Obesity Class I based on current BMI.   Current diet order is Regular, patient is consuming approximately 60% of meals at this time. Labs and medications reviewed.   No nutrition interventions warranted at this time. If nutrition issues arise, please consult RD.   Arthur Holms, RD, LDN Pager #: [phone] After-Hours Pager #: [phone]

## 2016-04-20 NOTE — Consult Note (Signed)
PCCM Consult Note  Admission date: 04/18/2016  CC: Shortness of breath  HPI: Consulted on 04/19/16 by Dr. Doyle Askew with [facility] to assess dyspnea.  71 yo female smoker presented to ER with dyspnea, cough, and chest congestion for 1 week prior to admission.  She was noted be hypoxic in ER.  She reported feeling feverish with chills, and cough with yellow sputum.  She was found to have pneumonia and concern for COPD exacerbation.  She was started on ABx, solumedrol, oxygen, and BDs.  She was previously followed by Dr. Melvyn Novas.  She uses 2 liters oxygen at baseline.  She gets short of breath after walking few steps at baseline.  She denies recent sick exposures.  She has been smoking 1 ppd.    She denies chest pain, abdominal pain, nausea, diarrhea, sinus congestion, ear pain, headache, or sore throat.  She has been wheezing.  Denies skin rash or leg swelling.   Subjective: States she is better. That she has been better and also worse. She did require BIPAP last pm for increased WOB per the nursing staff. On [place] at 6 L with saturation of 95%.  Vital signs: BP (!) 165/85 (BP Location: Left Arm)   Pulse 96   Temp 98.2 F (36.8 C) (Oral)   Resp (!) 26   Ht 5\' 8"  (1.727 m)   Wt 200 lb (90.7 kg)   SpO2 96%   BMI 30.41 kg/m   Intake/output: I/O last 3 completed shifts: In: 3387.4 [P.O.:240; I.V.:1647.5; IV Piggyback:1499.9] Out: 500 [Urine:500]  General: Awake, alert and appropriate, on  6 L Neuro: Alert and Oriented x 3, MAE x 4, Follows commands HEENT: Normocephalic, atraumatic, MM pink and moist, NO JVD Cardiac: RRR, no rubs murmurs or gallop Chest: Diffuse expiratory wheezing, scattered rhonchi Abd: Obese, soft, non-tender, BS+ Ext: Without edema Skin: intact, warm and dry, without rash   CMP Latest Ref Rng & Units 04/20/2016 04/19/2016 04/18/2016  Glucose 65 - 99 mg/dL 213(H) 187(H) 147(H)  BUN 6 - 20 mg/dL 14 22(H) 21(H)  Creatinine 0.44 - 1.00 mg/dL 0.61 0.80 0.93  Sodium 135 -  145 mmol/L 135 131(L) 129(L)  Potassium 3.5 - 5.1 mmol/L 3.4(L) 3.4(L) 3.1(L)  Chloride 101 - 111 mmol/L 90(L) 86(L) 82(L)  CO2 22 - 32 mmol/L 34(H) 31 32  Calcium 8.9 - 10.3 mg/dL 7.8(L) 7.4(L) 7.9(L)  Total Protein 6.5 - 8.1 g/dL - - 6.5  Total Bilirubin 0.3 - 1.2 mg/dL - - 0.8  Alkaline Phos 38 - 126 U/L - - 126  AST 15 - 41 U/L - - 30  ALT 14 - 54 U/L - - 34     CBC Latest Ref Rng & Units 04/20/2016 04/19/2016 04/18/2016  WBC 4.0 - 10.5 K/uL 16.7(H) 22.9(H) 21.3(H)  Hemoglobin 12.0 - 15.0 g/dL 10.2(L) 10.7(L) 11.0(L)  Hematocrit 36.0 - 46.0 % 32.4(L) 33.3(L) 34.6(L)  Platelets 150 - 400 K/uL 350 346 320     ABG    Component Value Date/Time   PHART 7.447 (H) 11/04/2009 1305   PCO2ART 43.9 11/04/2009 1305   PO2ART 62.4 (L) 11/04/2009 1305   HCO3 29.8 (H) 11/04/2009 1305   TCO2 31.2 11/04/2009 1305   O2SAT 93.7 11/04/2009 1305     CBG (last 3)   Recent Labs  04/19/16 1725 04/19/16 2131 04/20/16 0805  GLUCAP 216* 166* 237*     Imaging: Dg Chest Port 1 View  Result Date: 04/20/2016 CLINICAL DATA:  Pneumonia EXAM: PORTABLE CHEST 1 VIEW COMPARISON:  04/18/2016 FINDINGS: Cardiac shadow is at the upper limits of normal in size. Aortic calcifications are again seen. Patchy bibasilar changes are again identified and stable. No new focal abnormality is seen. No bony abnormality is noted. IMPRESSION: Stable bibasilar changes. Electronically Signed   By: Inez Catalina M.D.   On: 04/20/2016 07:09   Dg Chest Port 1 View  Result Date: 04/18/2016 CLINICAL DATA:  Cough.  Dyspnea. EXAM: PORTABLE CHEST 1 VIEW COMPARISON:  04/07/2016 chest radiograph. FINDINGS: Stable cardiomediastinal silhouette with top-normal heart size and aortic atherosclerosis. No pneumothorax. No pleural effusion. Patchy consolidation in the peripheral mid to lower right lung and at the left lung base, which appears new. IMPRESSION: New patchy consolidation in the peripheral mid to lower right lung and at the left  lung base, suspicious for multilobar pneumonia. Recommend follow-up PA and lateral post treatment chest radiographs in 4-6 weeks. Aortic atherosclerosis. Electronically Signed   By: Ilona Sorrel M.D.   On: 04/18/2016 16:52     Studies: CT angio 04/07/16 >> atherosclerosis, emphysema, patchy GGO RML, lingula scar PFT 02/02/08 >> FEV1 1.21 (45%), FEV1% 51, TLC 5.12 (87%), DLCO 56% CXR 4/17>> Stable bi-basilar changes  Antibiotics: Rocephin 4/15 >> Zithromax 4/15 >>  Cultures: Influenza 4/15 >>  Pneumococcal Ag 4/15 >> Respiratory viral panel 4/15 >> Negative to date  Lines/tubes: PIV  Events: 4/15 Admit 4/16 Add prn Bipap  Summary: 71 yo female with acute on chronic respiratory failure from CAP with AECOPD.  Assessment/plan:  Acute on chronic hypoxic respiratory failure 2nd to CAP and AECOPD. Tobacco abuse. Continued wheezing Required BIPAP 4/17 early am Plan: - Day 3 of Abx - continue solumedrol as ordered - scheduled BDs, add pulmicort and brovana - bronchial hygiene: Flutter valve, Mobilize - hold dulera for now - oxygen to keep SpO2 90 to 95% - prn Bipap - Follow CXR prn  Sepsis with lactic acidosis. Improving leukocytosis/ Afebrile - improving - can decrease IVF - continue to monitor hemodynamics - Trend CBC and Fever curve  Hyponatremia.>> resolving Hypokalemia. - f/u BMET prn  - Replete electrolytes per primary team  Anemia of critical illness. - f/u CBC - Transfuse for HGB < 7 per Primary team  DM with steroid induced hyperglycemia. - SSI - CBG  AC and HS - Consider addition of Novolog   Hx of HTN, HLD. - Continue asa, amlodipine, avapro  Hx of depression. - Continue paxil  Deconditioning. - will need PT assessment when more stable - Weight loss / Consider dietician consult  DVT prophylaxis - lovenox SUP - protonix Nutrition - regular diet Goals of care - full code  Continue SDU status as patient required BIPAP last night. Will need  follow up with Pulmonary Office  Upon discharge to establish as outpatient, as patient states she does not see a lung doctor. Consider workup for OSA as outpatient.   Magdalen Spatz, AGACNP-BC [place] Pager # [phone] 04/20/2016, 9:09 AM After 3pm call: [phone]

## 2016-04-20 NOTE — Progress Notes (Signed)
Inpatient Diabetes Program Recommendations  AACE/ADA: New Consensus Statement on Inpatient Glycemic Control (2015)  Target Ranges:  Prepandial:   less than 140 mg/dL      Peak postprandial:   less than 180 mg/dL (1-2 hours)      Critically ill patients:  140 - 180 mg/dL   Results for WARRENE, KAPFER (MRN [id number]) as of 04/20/2016 09:14  Ref. Range 04/19/2016 08:44 04/19/2016 12:27 04/19/2016 17:25 04/19/2016 21:31 04/20/2016 08:05  Glucose-Capillary Latest Ref Range: 65 - 99 mg/dL 198 (H) 228 (H) 216 (H) 166 (H) 237 (H)   Review of Glycemic Control  Diabetes history: DM2 Outpatient Diabetes medications:  [place] 10 mcg BID with meals, Janumet 50-1000 mg BID Current orders for Inpatient glycemic control: Novolog 0-9 units TID with meals  Inpatient Diabetes Program Recommendations: Correction (SSI): Please consider ordering Novolog 0-5 units QHS for bedtime correction. Insulin - Meal Coverage: If steroids are continued as ordered, please consider ordering Novolog 4 units TID with meals if patient eats at least 50% of meals. A1C: Please consider ordering an A1C to evaluate glycemic control over the past 2-3 months. Diet: Please discontinue Regular diet and order Carb Modified diet.  Thanks, Barnie Alderman, RN, MSN, CDE Diabetes Coordinator Inpatient Diabetes Program [phone] (Team Pager from 8am to 5pm)

## 2016-04-21 ENCOUNTER — Inpatient Hospital Stay (HOSPITAL_COMMUNITY): Payer: Medicare HMO

## 2016-04-21 LAB — CBC
HCT: 32.1 % — ABNORMAL LOW (ref 36.0–46.0)
HEMOGLOBIN: 10 g/dL — AB (ref 12.0–15.0)
MCH: 24.5 pg — AB (ref 26.0–34.0)
MCHC: 31.2 g/dL (ref 30.0–36.0)
MCV: 78.7 fL (ref 78.0–100.0)
PLATELETS: 334 10*3/uL (ref 150–400)
RBC: 4.08 MIL/uL (ref 3.87–5.11)
RDW: 16.7 % — AB (ref 11.5–15.5)
WBC: 12.8 10*3/uL — ABNORMAL HIGH (ref 4.0–10.5)

## 2016-04-21 LAB — HEMOGLOBIN A1C
HEMOGLOBIN A1C: 6 % — AB (ref 4.8–5.6)
Mean Plasma Glucose: 126 mg/dL

## 2016-04-21 LAB — GLUCOSE, CAPILLARY
GLUCOSE-CAPILLARY: 195 mg/dL — AB (ref 65–99)
Glucose-Capillary: 286 mg/dL — ABNORMAL HIGH (ref 65–99)
Glucose-Capillary: 216 mg/dL — ABNORMAL HIGH (ref 65–99)
Glucose-Capillary: 308 mg/dL — ABNORMAL HIGH (ref 65–99)

## 2016-04-21 LAB — BASIC METABOLIC PANEL
Anion gap: 13 (ref 5–15)
BUN: 15 mg/dL (ref 6–20)
CALCIUM: 8.3 mg/dL — AB (ref 8.9–10.3)
CHLORIDE: 88 mmol/L — AB (ref 101–111)
CO2: 33 mmol/L — ABNORMAL HIGH (ref 22–32)
CREATININE: 0.56 mg/dL (ref 0.44–1.00)
GFR calc non Af Amer: >60 mL/min (ref >60)
Glucose, Bld: 209 mg/dL — ABNORMAL HIGH (ref 65–99)
Potassium: 4 mmol/L (ref 3.5–5.1)
SODIUM: 134 mmol/L — AB (ref 135–145)

## 2016-04-21 LAB — BRAIN NATRIURETIC PEPTIDE: B NATRIURETIC PEPTIDE 5: 146.3 pg/mL — AB (ref 0.0–100.0)

## 2016-04-21 MED ORDER — METHYLPREDNISOLONE SODIUM SUCC 125 MG IJ SOLR
60.0000 mg | Freq: Three times a day (TID) | INTRAMUSCULAR | Status: DC
  Administered 2016-04-21 – 2016-04-22 (×3): 60 mg via INTRAVENOUS
  Filled 2016-04-21 (×3): qty 2

## 2016-04-21 MED ORDER — SIMETHICONE 80 MG PO CHEW
160.0000 mg | CHEWABLE_TABLET | Freq: Four times a day (QID) | ORAL | Status: DC | PRN
  Administered 2016-04-21: 160 mg via ORAL
  Filled 2016-04-21: qty 2

## 2016-04-21 MED ORDER — BUDESONIDE 0.5 MG/2ML IN SUSP
0.5000 mg | Freq: Two times a day (BID) | RESPIRATORY_TRACT | Status: DC
  Administered 2016-04-21 – 2016-04-23 (×4): 0.5 mg via RESPIRATORY_TRACT
  Filled 2016-04-21 (×4): qty 2

## 2016-04-21 MED ORDER — INSULIN GLARGINE 100 UNIT/ML ~~LOC~~ SOLN
15.0000 [IU] | Freq: Every day | SUBCUTANEOUS | Status: DC
  Administered 2016-04-22: 15 [IU] via SUBCUTANEOUS
  Filled 2016-04-21 (×2): qty 0.15

## 2016-04-21 MED ORDER — BENZONATATE 100 MG PO CAPS
200.0000 mg | ORAL_CAPSULE | Freq: Three times a day (TID) | ORAL | Status: DC | PRN
  Administered 2016-04-21 – 2016-04-22 (×2): 200 mg via ORAL
  Filled 2016-04-21 (×2): qty 2

## 2016-04-21 NOTE — NC FL2 (Signed)
[place] LEVEL OF CARE SCREENING TOOL     IDENTIFICATION  Patient Name: [name] Birthdate: [birth year] Sex: female Admission Date (Current Location): 04/18/2016  [facility] and [place] Number:  Herbalist and Address:  [facility] [place], [place] [address], [place], [place] [address]      Provider Number: [id number]  Attending Physician Name and Address:  Jonetta Osgood, MD  Relative Name and Phone Number:       Current Level of Care: Hospital Recommended Level of Care: [place] Prior Approval Number:    Date Approved/Denied:   PASRR Number: [id number]  Discharge Plan: SNF    Current Diagnoses: Patient Active Problem List   Diagnosis Date Noted  . Sepsis due to pneumonia ([place]) 04/18/2016  . COPD (chronic obstructive pulmonary disease) ([place]) 12/04/2010  . Smoker 12/04/2010  . HYPERLIPIDEMIA 01/31/2008  . Essential hypertension 01/31/2008  . Chronic respiratory failure ([place]) 01/31/2008    Orientation RESPIRATION BLADDER Height & Weight     Self, Time, Situation, Place  O2 (2L [place]) Continent Weight: 200 lb (90.7 kg) Height:  5\' 8"  (172.7 cm)  BEHAVIORAL SYMPTOMS/MOOD NEUROLOGICAL BOWEL NUTRITION STATUS      Continent Diet (carb modified)  AMBULATORY STATUS COMMUNICATION OF NEEDS Skin   Limited Assist Verbally Normal                       Personal Care Assistance Level of Assistance  Bathing, Dressing Bathing Assistance: Limited assistance   Dressing Assistance: Limited assistance     Functional Limitations Info             SPECIAL CARE FACTORS FREQUENCY  PT (By licensed PT), OT (By licensed OT)     PT Frequency: 5/wk OT Frequency: 5/wk            Contractures      Additional Factors Info  Code Status, Allergies, Insulin Sliding Scale Code Status Info: FULL Allergies Info: NKA   Insulin Sliding Scale Info: 7/wk       Current Medications (04/21/2016):  This is the  current hospital active medication list Current Facility-Administered Medications  Medication Dose Route Frequency Provider Last Rate Last Dose  . amLODipine (NORVASC) tablet 10 mg  10 mg Oral Daily Theodis Blaze, MD   10 mg at 04/21/16 0858  . arformoterol (BROVANA) nebulizer solution 15 mcg  15 mcg Nebulization BID Chesley Mires, MD   15 mcg at 04/21/16 0802  . aspirin EC tablet 81 mg  81 mg Oral Daily Theodis Blaze, MD   81 mg at 04/21/16 0859  . azithromycin (ZITHROMAX) 500 mg in dextrose 5 % 250 mL IVPB  500 mg Intravenous Q24H Theodis Blaze, MD   500 mg at 04/20/16 1948  . benzonatate (TESSALON) capsule 200 mg  200 mg Oral TID PRN Jonetta Osgood, MD   200 mg at 04/21/16 1217  . budesonide (PULMICORT) nebulizer solution 0.5 mg  0.5 mg Nebulization BID Arnell Asal, NP      . cefTRIAXone (ROCEPHIN) 1 g in dextrose 5 % 50 mL IVPB  1 g Intravenous Q24H Theodis Blaze, MD   1 g at 04/20/16 1711  . diazepam (VALIUM) tablet 5 mg  5 mg Oral Daily PRN Theodis Blaze, MD      . diphenhydrAMINE (BENADRYL) tablet 50 mg  50 mg Oral QHS Theodis Blaze, MD   50 mg at 04/20/16 2151  .  enoxaparin (LOVENOX) injection 40 mg  40 mg Subcutaneous Q24H Theodis Blaze, MD   40 mg at 04/20/16 2151  . feeding supplement (ENSURE ENLIVE) (ENSURE ENLIVE) liquid 237 mL  237 mL Oral BID BM Theodis Blaze, MD   237 mL at 04/21/16 1521  . guaiFENesin (MUCINEX) 12 hr tablet 1,200 mg  1,200 mg Oral BID Chesley Mires, MD   1,200 mg at 04/21/16 0902  . hydrALAZINE (APRESOLINE) injection 5 mg  5 mg Intravenous Q4H PRN Theodis Blaze, MD      . ibuprofen (ADVIL,MOTRIN) tablet 400 mg  400 mg Oral Q6H PRN Theodis Blaze, MD      . insulin aspart (novoLOG) injection 0-9 Units  0-9 Units Subcutaneous TID WC Theodis Blaze, MD   2 Units at 04/21/16 1339  . insulin aspart (novoLOG) injection 4 Units  4 Units Subcutaneous TID WC Theodis Blaze, MD   4 Units at 04/21/16 1340  . insulin glargine (LANTUS) injection 15 Units  15 Units Subcutaneous  Daily Kara Mead V, MD      . ipratropium-albuterol (DUONEB) 0.5-2.5 (3) MG/3ML nebulizer solution 3 mL  3 mL Nebulization Q6H Theodis Blaze, MD   3 mL at 04/21/16 1402  . ipratropium-albuterol (DUONEB) 0.5-2.5 (3) MG/3ML nebulizer solution 3 mL  3 mL Nebulization Q2H PRN Theodis Blaze, MD   3 mL at 04/19/16 2357  . irbesartan (AVAPRO) tablet 150 mg  150 mg Oral Daily Theodis Blaze, MD   150 mg at 04/21/16 0906  . MEDLINE mouth rinse  15 mL Mouth Rinse BID Theodis Blaze, MD   15 mL at 04/21/16 0907  . methylPREDNISolone sodium succinate (SOLU-MEDROL) 125 mg/2 mL injection 60 mg  60 mg Intravenous Q8H Kara Mead V, MD   60 mg at 04/21/16 1525  . pantoprazole (PROTONIX) EC tablet 40 mg  40 mg Oral Daily Theodis Blaze, MD   40 mg at 04/21/16 0859  . PARoxetine (PAXIL) tablet 30 mg  30 mg Oral Daily Theodis Blaze, MD   30 mg at 04/21/16 7530     Discharge Medications: Please see discharge summary for a list of discharge medications.  Relevant Imaging Results:  Relevant Lab Results:   Additional Information SS#: [id number]  Jorge Ny, LCSW

## 2016-04-21 NOTE — Evaluation (Signed)
Physical Therapy Evaluation Patient Details Name: [name] MRN: [id number] DOB: June 12, 1945 Today's Date: 04/21/2016   History of Present Illness  Pt is a 71 y/o female admitted secondary to worsening SOB, found to have sepsis secondary to CAP. Pt on 2L of supplemental O2 at home but has required PRN BiPAP this admission. PMH including but not limited to COPD, DM, HTN and HLD.   Clinical Impression  Pt presented supine in bed with HOB elevated, awake and willing to participate in therapy session. Prior to admission, pt reported that she was independent with all functional mobility and ADLs. Pt currently requires min A for transfers and min guard to ambulate a very short distance (10') with RW (see below for details regarding vitals). Pt very limited secondary to fatigue at this time and would greatly benefit from [place] [facility] following d/c. Pt would continue to benefit from skilled physical therapy services at this time while admitted and after d/c to address the below listed limitations in order to improve overall safety and independence with functional mobility.     Follow Up Recommendations SNF;Supervision/Assistance - 24 hour    Equipment Recommendations  Rolling walker with 5" wheels;3in1 (PT)    Recommendations for Other Services       Precautions / Restrictions Precautions Precautions: Fall Precaution Comments: monitor SPO2 Restrictions Weight Bearing Restrictions: No      Mobility  Bed Mobility Overal bed mobility: Needs Assistance Bed Mobility: Supine to Sit     Supine to sit: Min guard     General bed mobility comments: increased time, HOB elevated, min guard for safety  Transfers Overall transfer level: Needs assistance Equipment used: Rolling walker (2 wheeled) Transfers: Sit to/from Stand Sit to Stand: Min assist         General transfer comment: increased time and effort, min A to rise from bed and for stability  Ambulation/Gait Ambulation/Gait  assistance: Min guard Ambulation Distance (Feet): 10 Feet Assistive device: Rolling walker (2 wheeled) Gait Pattern/deviations: Step-through pattern;Decreased step length - right;Decreased step length - left;Decreased stride length Gait velocity: decreased Gait velocity interpretation: Below normal speed for age/gender General Gait Details: mild instability with ambulation, but no LOB, min guard for safety. Pt very limited secondary to fatigue and weakness. Pt ambulated on 3L of O2 with SPO2 decreasing to 84%, which required a sitting rest break of several minutes to recover to >90%.  Stairs            Wheelchair Mobility    Modified Rankin (Stroke Patients Only)       Balance Overall balance assessment: Needs assistance Sitting-balance support: Feet supported Sitting balance-Leahy Scale: Fair     Standing balance support: During functional activity;Bilateral upper extremity supported Standing balance-Leahy Scale: Poor Standing balance comment: pt reliant on bilateral UEs on RW                             Pertinent Vitals/Pain Pain Assessment: No/denies pain    Home Living Family/patient expects to be discharged to:: Private residence Living Arrangements: Alone Available Help at Discharge: Family;Friend(s);Available PRN/intermittently   Home Access: Stairs to enter   Entrance Stairs-Number of Steps: 3 Home Layout: One level Home Equipment: None      Prior Function Level of Independence: Independent         Comments: pt reported that up until approximately one month ago, she was independent with all mobility and ADLs.  Hand Dominance        Extremity/Trunk Assessment   Upper Extremity Assessment Upper Extremity Assessment: Overall WFL for tasks assessed    Lower Extremity Assessment Lower Extremity Assessment: Generalized weakness    Cervical / Trunk Assessment Cervical / Trunk Assessment: Kyphotic  Communication    Communication: No difficulties  Cognition Arousal/Alertness: Awake/alert Behavior During Therapy: WFL for tasks assessed/performed Overall Cognitive Status: Within Functional Limits for tasks assessed                                        General Comments      Exercises     Assessment/Plan    PT Assessment Patient needs continued PT services  PT Problem List Decreased strength;Decreased activity tolerance;Decreased balance;Decreased coordination;Decreased mobility;Decreased knowledge of use of DME;Decreased safety awareness;Cardiopulmonary status limiting activity       PT Treatment Interventions DME instruction;Gait training;Stair training;Functional mobility training;Therapeutic activities;Therapeutic exercise;Balance training;Neuromuscular re-education;Patient/family education    PT Goals (Current goals can be found in the Care Plan section)  Acute Rehab PT Goals Patient Stated Goal: to get stronger and return to PLOF PT Goal Formulation: With patient Time For Goal Achievement: 05/05/16 Potential to Achieve Goals: Good    Frequency Min 3X/week   Barriers to discharge Decreased caregiver support      Co-evaluation               End of Session Equipment Utilized During Treatment: Gait belt;Oxygen Activity Tolerance: Patient limited by fatigue Patient left: in chair;with call bell/phone within reach Nurse Communication: Mobility status PT Visit Diagnosis: Other abnormalities of gait and mobility (R26.89)    Time: 3976-7341 PT Time Calculation (min) (ACUTE ONLY): 21 min   Charges:   PT Evaluation $PT Eval Moderate Complexity: 1 Procedure     PT G Codes:        Sherie Don, PT, DPT [place] 04/21/2016, 3:13 PM

## 2016-04-21 NOTE — Progress Notes (Signed)
Pt taken off bipap and placed on 5L [place] at this time. Pt denies sob, no distress noted. VS within normal limits. RT will continue to monitor.

## 2016-04-21 NOTE — Progress Notes (Signed)
PCCM Progress Note  Admission date: 04/18/2016  CC: Shortness of breath  Brief Summary:  Consulted on 04/19/16 by Dr. Doyle Askew with [facility] to assess dyspnea.  71 yo female smoker presented to ER with dyspnea, cough, and chest congestion for 1 week prior to admission.  She was noted be hypoxic in ER.  She reported feeling feverish with chills, and cough with yellow sputum.  She was found to have pneumonia and concern for COPD exacerbation.  She was started on ABx, solumedrol, oxygen, and BDs.  She was previously followed by Dr. Melvyn Novas.  She uses 2 liters oxygen at baseline.  She gets short of breath after walking few steps at baseline.  She denies recent sick exposures.  She has been smoking 1 ppd.    She denies chest pain, abdominal pain, nausea, diarrhea, sinus congestion, ear pain, headache, or sore throat.  She has been wheezing.  Denies skin rash or leg swelling.   Subjective: She states she feels the same as yesterday, denies any SOB.  On 5L with sats 98%.  Per RN, placed on BIPAP overnight at patients request.    Vital signs: BP (!) 161/79 (BP Location: Left Arm)   Pulse 69   Temp 97.1 F (36.2 C) (Oral)   Resp (!) 22   Ht 5\' 8"  (1.727 m)   Wt 200 lb (90.7 kg)   SpO2 100%   BMI 30.41 kg/m   Intake/output: I/O last 3 completed shifts: In: 1460 [P.O.:960; IV Piggyback:500] Out: 2100 [Urine:2100]  General:  Awake and alert female sitting in bed on 5L [place] HEENT: MM pink/moist Neuro: Alert and oriented to person, place, and event CV: RRR, no m/r/g PULM: even/mild work of breathing, diffuse wheezing R>L, rhonchi on right anteriorly, able to conversate GI: Obese, soft, non-tender, bsx4 active  Extremities: warm/dry, no edema  Skin: no rashes or lesions    CMP Latest Ref Rng & Units 04/21/2016 04/20/2016 04/19/2016  Glucose 65 - 99 mg/dL 209(H) 213(H) 187(H)  BUN 6 - 20 mg/dL 15 14 22(H)  Creatinine 0.44 - 1.00 mg/dL 0.56 0.61 0.80  Sodium 135 - 145 mmol/L 134(L) 135 131(L)   Potassium 3.5 - 5.1 mmol/L 4.0 3.4(L) 3.4(L)  Chloride 101 - 111 mmol/L 88(L) 90(L) 86(L)  CO2 22 - 32 mmol/L 33(H) 34(H) 31  Calcium 8.9 - 10.3 mg/dL 8.3(L) 7.8(L) 7.4(L)  Total Protein 6.5 - 8.1 g/dL - - -  Total Bilirubin 0.3 - 1.2 mg/dL - - -  Alkaline Phos 38 - 126 U/L - - -  AST 15 - 41 U/L - - -  ALT 14 - 54 U/L - - -     CBC Latest Ref Rng & Units 04/21/2016 04/20/2016 04/19/2016  WBC 4.0 - 10.5 K/uL 12.8(H) 16.7(H) 22.9(H)  Hemoglobin 12.0 - 15.0 g/dL 10.0(L) 10.2(L) 10.7(L)  Hematocrit 36.0 - 46.0 % 32.1(L) 32.4(L) 33.3(L)  Platelets 150 - 400 K/uL 334 350 346     ABG    Component Value Date/Time   PHART 7.447 (H) 11/04/2009 1305   PCO2ART 43.9 11/04/2009 1305   PO2ART 62.4 (L) 11/04/2009 1305   HCO3 29.8 (H) 11/04/2009 1305   TCO2 31.2 11/04/2009 1305   O2SAT 93.7 11/04/2009 1305     CBG (last 3)   Recent Labs  04/20/16 1630 04/20/16 2203 04/21/16 0907  GLUCAP 217* 158* 286*     Imaging: Dg Chest Port 1 View  Result Date: 04/21/2016 CLINICAL DATA:  Respiratory failure. EXAM: PORTABLE CHEST 1 VIEW COMPARISON:  04/20/2016 FINDINGS: Persistent subtle parenchymal densities in the lower lungs, right side greater than left. Minimal change from the recent comparison examination. Upper lungs remain clear. Heart size is within normal limits and stable. Atherosclerotic calcifications at the aortic arch. Negative for a pneumothorax. IMPRESSION: Persistent subtle parenchymal densities in the lower lungs, right side greater than left. Infectious etiology cannot be excluded. Minimal change from the previous examination. Electronically Signed   By: Markus Daft M.D.   On: 04/21/2016 07:54   Dg Chest Port 1 View  Result Date: 04/20/2016 CLINICAL DATA:  Pneumonia EXAM: PORTABLE CHEST 1 VIEW COMPARISON:  04/18/2016 FINDINGS: Cardiac shadow is at the upper limits of normal in size. Aortic calcifications are again seen. Patchy bibasilar changes are again identified and stable.  No new focal abnormality is seen. No bony abnormality is noted. IMPRESSION: Stable bibasilar changes. Electronically Signed   By: Inez Catalina M.D.   On: 04/20/2016 07:09     Studies: CT angio 04/07/16 >> atherosclerosis, emphysema, patchy GGO RML, lingula scar PFT 02/02/08 >> FEV1 1.21 (45%), FEV1% 51, TLC 5.12 (87%), DLCO 56% CXR 4/17>> Stable bi-basilar changes CXR 4/18 >> patchy lower inflitrates R>L  Antibiotics: Rocephin 4/15 >> Zithromax 4/15 >>  Cultures: Influenza 4/15 >>  Pneumococcal Ag 4/15 >> Respiratory viral panel 4/15 >> Negative to date  Lines/tubes: PIV  Events: 4/15 Admit 4/16 Add prn Bipap 4/17 prn BiPAP  Summary: 71 yo female with acute on chronic respiratory failure from CAP with AECOPD.  Assessment/plan:  Acute on chronic hypoxic respiratory failure 2nd to CAP and AECOPD. - 2L O2 home requirement Tobacco abuse. Continued wheezing Required BIPAP 4/17 early am Plan: - Continue abx 4/x - Continue solumedrol 60mg  q6hr - Continue BDs, brovanna, and increase pulmicort to 0.5 BID - Aggressive pulmonary hygiene with flutter valve and mobilize - Titrate O2 down to baseline 2L for sats 90-95% - PRN BiPAP - Follow CXR - Trend fever curve/ WBC  Sepsis with lactic acidosis. Improving leukocytosis/ Afebrile - improving - can decrease IVF - continue to monitor hemodynamics - Trend CBC and Fever curve  DM with steroid induced hyperglycemia. - SSI - CBG  AC and HS - Lantus added 4/17  Deconditioning. - will need PT assessment when more stable - Weight loss / Consider dietician consult  DVT prophylaxis - lovenox SUP - protonix Nutrition - regular diet Goals of care - full code  Will need follow up with Pulmonary Office  Upon discharge to establish as outpatient, as patient states she does not see a lung doctor.  Consider workup for OSA as outpatient.  Kennieth Rad, AGACNP-BC [facility] Pgr: [phone] or if no answer  [phone] 04/21/2016, 9:49 AM

## 2016-04-21 NOTE — Progress Notes (Signed)
PROGRESS NOTE        PATIENT DETAILS Name: [name] Age: 71 y.o. Sex: female Date of Birth: Aug 18, 1945 Admit Date: 04/18/2016 Admitting Physician Theodis Blaze, MD PCP:Pcp Not In System  Brief Narrative: Patient is a 71 y.o. female with prior history of COPD, tobacco use, hypertension, type 2 diabetes who presented to the hospital with worsening shortness of breath.She was found to have bilateral pneumonia, hypoxic requiring BiPAP-she was empirically started on broad-spectrum antibiotics, and admitted to the hospitalist service for further evaluation and treatment. See below for further details.   Subjective: Required BiPAP last night. But overall feels better.  Assessment/Plan: Sepsis secondary to community-acquired multifocal pneumonia: Sepsis pathophysiology has resolved, she slowly improving-continue with broad-spectrum antimicrobial therapy. Cultures negative so far. Urine streptococcal antigen negative.  Acute on chronic hypoxemic respiratory failure: Secondary to multifocal pneumonia and COPD exacerbation. Oxygen requirements are slowly coming down, she still continues to require BiPAP intermittently. Plans are to continue antibiotics, bronchodilators and IV Solu-Medrol. Slowly taper down oxygen to home regimen.PCCM following.  COPD exacerbation: Probably provoked by pneumonia. Moving air well-but continues to have coarse rhonchi. Continue bronchodilators, steroids.Slowly taper down oxygen to home regimen.  DM-2 with uncontrolled hyperglycemia: CBGs on the higher side-probably due to steroids-continue Lantus 15 units, 4 units of NovoLog and SSI. We will follow and adjust accordingly. Continue to hold oral hypoglycemic agents-we'll plan to resume on discharge.  Hypertension: BP on the higher side-continue amlodipine and Avapro-monitor for 1 additional day before adjusting.  Mild hyponatremia: Stable for follow-up without any further  workup.  Tobacco abuse: Counseled regarding the importance of quitting.  DVT Prophylaxis: Prophylactic Lovenox  Code Status: Full code  Family Communication: None at bedside  Disposition Plan: Remain inpatient-remain in SDU  Antimicrobial agents: Anti-infectives    Start     Dose/Rate Route Frequency Ordered Stop   04/19/16 1800  cefTRIAXone (ROCEPHIN) 2 g in dextrose 5 % 50 mL IVPB  Status:  Discontinued     2 g 100 mL/hr over 30 Minutes Intravenous Every 24 hours 04/18/16 1711 04/18/16 1923   04/19/16 1800  azithromycin (ZITHROMAX) 500 mg in dextrose 5 % 250 mL IVPB  Status:  Discontinued     500 mg 250 mL/hr over 60 Minutes Intravenous Every 24 hours 04/18/16 1711 04/18/16 1923   04/19/16 1800  cefTRIAXone (ROCEPHIN) 1 g in dextrose 5 % 50 mL IVPB     1 g 100 mL/hr over 30 Minutes Intravenous Every 24 hours 04/18/16 1900 04/26/16 1759   04/18/16 2000  azithromycin (ZITHROMAX) 500 mg in dextrose 5 % 250 mL IVPB     500 mg 250 mL/hr over 60 Minutes Intravenous Every 24 hours 04/18/16 1900 04/25/16 1959   04/18/16 1715  cefTRIAXone (ROCEPHIN) 1 g in dextrose 5 % 50 mL IVPB  Status:  Discontinued     1 g 100 mL/hr over 30 Minutes Intravenous  Once 04/18/16 1703 04/18/16 1711   04/18/16 1715  azithromycin (ZITHROMAX) 500 mg in dextrose 5 % 250 mL IVPB  Status:  Discontinued     500 mg 250 mL/hr over 60 Minutes Intravenous  Once 04/18/16 1703 04/18/16 1920   04/18/16 1715  cefTRIAXone (ROCEPHIN) 2 g in dextrose 5 % 50 mL IVPB     2 g 100 mL/hr over 30 Minutes Intravenous  Once 04/18/16 1711 04/18/16 1839  Procedures: None  CONSULTS:  pulmonary/intensive care  Time spent: 25 minutes-Greater than 50% of this time was spent in counseling, explanation of diagnosis, planning of further management, and coordination of care.  MEDICATIONS: Scheduled Meds: . amLODipine  10 mg Oral Daily  . arformoterol  15 mcg Nebulization BID  . aspirin EC  81 mg Oral Daily  .  budesonide (PULMICORT) nebulizer solution  0.5 mg Nebulization BID  . diphenhydrAMINE  50 mg Oral QHS  . enoxaparin (LOVENOX) injection  40 mg Subcutaneous Q24H  . feeding supplement (ENSURE ENLIVE)  237 mL Oral BID BM  . guaiFENesin  1,200 mg Oral BID  . insulin aspart  0-9 Units Subcutaneous TID WC  . insulin aspart  4 Units Subcutaneous TID WC  . insulin glargine  15 Units Subcutaneous Daily  . ipratropium-albuterol  3 mL Nebulization Q6H  . irbesartan  150 mg Oral Daily  . mouth rinse  15 mL Mouth Rinse BID  . methylPREDNISolone (SOLU-MEDROL) injection  60 mg Intravenous Q8H  . pantoprazole  40 mg Oral Daily  . PARoxetine  30 mg Oral Daily   Continuous Infusions: . azithromycin    . cefTRIAXone (ROCEPHIN)  IV     PRN Meds:.diazepam, guaiFENesin-dextromethorphan, hydrALAZINE, ibuprofen, ipratropium-albuterol   PHYSICAL EXAM: Vital signs: Vitals:   04/21/16 0125 04/21/16 0331 04/21/16 0738 04/21/16 0802  BP:  (!) 153/77 (!) 161/79   Pulse: 98  69   Resp: (!) 23 (!) 23 (!) 22   Temp:  97.1 F (36.2 C) 97.1 F (36.2 C)   TempSrc:  Axillary Oral   SpO2:  100% 93% 100%  Weight:      Height:       Filed Weights   04/18/16 2000  Weight: 90.7 kg (200 lb)   Body mass index is 30.41 kg/m.   General appearance :Awake, alert, not in any distress. Eyes:, pupils equally reactive to light and accomodation,no scleral icterus. HEENT: Atraumatic and Normocephalic Neck: supple, no JVD. No cervical lymphadenopathy. Resp: Moving air bilaterally-coarse rhonchi all over. CVS: S1 S2 regular, no murmurs.  GI: Bowel sounds present, Non tender and not distended with no gaurding, rigidity or rebound.No organomegaly Extremities: B/L Lower Ext shows no edema, both legs are warm to touch Neurology:  speech clear,Non focal, sensation is grossly intact. Psychiatric: Normal judgment and insight. Alert and oriented x 3. Normal mood. Musculoskeletal:No digital cyanosis Skin:No Rash, warm and  dry Wounds:N/A  I have personally reviewed following labs and imaging studies  LABORATORY DATA: CBC:  Recent Labs Lab 04/18/16 1632 04/19/16 0221 04/20/16 0229 04/21/16 0253  WBC 21.3* 22.9* 16.7* 12.8*  NEUTROABS 18.8*  --   --   --   HGB 11.0* 10.7* 10.2* 10.0*  HCT 34.6* 33.3* 32.4* 32.1*  MCV 77.9* 77.6* 78.8 78.7  PLT 320 346 350 048    Basic Metabolic Panel:  Recent Labs Lab 04/18/16 1632 04/19/16 0221 04/20/16 0229 04/21/16 0253  NA 129* 131* 135 134*  K 3.1* 3.4* 3.4* 4.0  CL 82* 86* 90* 88*  CO2 32 31 34* 33*  GLUCOSE 147* 187* 213* 209*  BUN 21* 22* 14 15  CREATININE 0.93 0.80 0.61 0.56  CALCIUM 7.9* 7.4* 7.8* 8.3*    GFR: Estimated Creatinine Clearance: 78.2 mL/min (by C-G formula based on SCr of 0.56 mg/dL).  Liver Function Tests:  Recent Labs Lab 04/18/16 1632  AST 30  ALT 34  ALKPHOS 126  BILITOT 0.8  PROT 6.5  ALBUMIN 2.5*  No results for input(s): LIPASE, AMYLASE in the last 168 hours. No results for input(s): AMMONIA in the last 168 hours.  Coagulation Profile:  Recent Labs Lab 04/18/16 1946  INR 1.18    Cardiac Enzymes: No results for input(s): CKTOTAL, CKMB, CKMBINDEX, TROPONINI in the last 168 hours.  BNP (last 3 results) No results for input(s): PROBNP in the last 8760 hours.  HbA1C: No results for input(s): HGBA1C in the last 72 hours.  CBG:  Recent Labs Lab 04/20/16 0805 04/20/16 1203 04/20/16 1630 04/20/16 2203 04/21/16 0907  GLUCAP 237* 196* 217* 158* 286*    Lipid Profile: No results for input(s): CHOL, HDL, LDLCALC, TRIG, CHOLHDL, LDLDIRECT in the last 72 hours.  Thyroid Function Tests: No results for input(s): TSH, T4TOTAL, FREET4, T3FREE, THYROIDAB in the last 72 hours.  Anemia Panel: No results for input(s): VITAMINB12, FOLATE, FERRITIN, TIBC, IRON, RETICCTPCT in the last 72 hours.  Urine analysis: No results found for: COLORURINE, APPEARANCEUR, LABSPEC, PHURINE, GLUCOSEU, HGBUR,  BILIRUBINUR, KETONESUR, PROTEINUR, UROBILINOGEN, NITRITE, LEUKOCYTESUR  Sepsis Labs: Lactic Acid, Venous    Component Value Date/Time   LATICACIDVEN 1.4 04/19/2016 0221    MICROBIOLOGY: Recent Results (from the past 240 hour(s))  Blood Culture (routine x 2)     Status: None (Preliminary result)   Collection Time: 04/18/16  4:35 PM  Result Value Ref Range Status   Specimen Description BLOOD LEFT ANTECUBITAL  Final   Special Requests   Final    BOTTLES DRAWN AEROBIC AND ANAEROBIC Blood Culture adequate volume   Culture NO GROWTH 2 DAYS  Final   Report Status PENDING  Incomplete  Blood Culture (routine x 2)     Status: None (Preliminary result)   Collection Time: 04/18/16  4:40 PM  Result Value Ref Range Status   Specimen Description BLOOD RIGHT ANTECUBITAL  Final   Special Requests   Final    BOTTLES DRAWN AEROBIC AND ANAEROBIC Blood Culture adequate volume   Culture NO GROWTH 2 DAYS  Final   Report Status PENDING  Incomplete  MRSA PCR Screening     Status: None   Collection Time: 04/18/16  6:53 PM  Result Value Ref Range Status   MRSA by PCR NEGATIVE NEGATIVE Final    Comment:        The GeneXpert MRSA Assay (FDA approved for NASAL specimens only), is one component of a comprehensive MRSA colonization surveillance program. It is not intended to diagnose MRSA infection nor to guide or monitor treatment for MRSA infections.   Respiratory Panel by PCR     Status: None   Collection Time: 04/18/16  7:51 PM  Result Value Ref Range Status   Adenovirus NOT DETECTED NOT DETECTED Final   Coronavirus 229E NOT DETECTED NOT DETECTED Final   Coronavirus HKU1 NOT DETECTED NOT DETECTED Final   Coronavirus NL63 NOT DETECTED NOT DETECTED Final   Coronavirus OC43 NOT DETECTED NOT DETECTED Final   Metapneumovirus NOT DETECTED NOT DETECTED Final   Rhinovirus / Enterovirus NOT DETECTED NOT DETECTED Final   Influenza A NOT DETECTED NOT DETECTED Final   Influenza B NOT DETECTED NOT  DETECTED Final   Parainfluenza Virus 1 NOT DETECTED NOT DETECTED Final   Parainfluenza Virus 2 NOT DETECTED NOT DETECTED Final   Parainfluenza Virus 3 NOT DETECTED NOT DETECTED Final   Parainfluenza Virus 4 NOT DETECTED NOT DETECTED Final   Respiratory Syncytial Virus NOT DETECTED NOT DETECTED Final   Bordetella pertussis NOT DETECTED NOT DETECTED Final   Chlamydophila pneumoniae  NOT DETECTED NOT DETECTED Final   Mycoplasma pneumoniae NOT DETECTED NOT DETECTED Final    RADIOLOGY STUDIES/RESULTS: Dg Chest 2 View  Result Date: 04/07/2016 CLINICAL DATA:  Shortness of breath and productive cough for the past week. History of COPD and diabetes. Current smoker. EXAM: CHEST  2 VIEW COMPARISON:  PA and lateral chest x-ray of December 09, 2014 FINDINGS: The lungs are adequately inflated. The interstitial markings are coarse though stable. There is no alveolar infiltrate or pleural effusion. The heart and pulmonary vascularity are normal. The mediastinum is normal in width. There is calcification in the wall of the thoracic aorta. The bony thorax exhibits no acute abnormality. IMPRESSION: There is no pneumonia nor CHF nor other acute cardiopulmonary abnormality. Stable chronic bronchitic changes. Thoracic aortic atherosclerosis. Electronically Signed   By: David  Martinique M.D.   On: 04/07/2016 16:09   Ct Angio Chest Pe W Or Wo Contrast  Result Date: 04/07/2016 CLINICAL DATA:  71 year old with an approximate 3-4 day history of progressively worsening shortness of breath. Hypoxia. Current history of diabetes, hypertension, COPD and diastolic dysfunction. EXAM: CT ANGIOGRAPHY CHEST WITH CONTRAST TECHNIQUE: Multidetector CT imaging of the chest was performed using the standard protocol during bolus administration of intravenous contrast. Multiplanar CT image reconstructions and MIPs were obtained to evaluate the vascular anatomy. CONTRAST:  80 mL Isovue 370 IV. COMPARISON:  No prior CT. Multiple prior chest x-rays,  including earlier today. FINDINGS: Cardiovascular: Contrast opacification of the pulmonary arteries is good. Respiratory motion blurred images of the lungs throughout the examination. Overall, the study is of good technical quality. No filling defects within either main pulmonary artery or their branches in either lung to suggest pulmonary embolism. Heart upper normal in size to slightly enlarged. Left ventricular hypertrophy. Moderate three-vessel coronary atherosclerosis. No pericardial effusion. Prominent epicardial fat. Severe atherosclerosis involving the thoracic and upper abdominal aorta without aneurysm or dissection. Atherosclerosis involving the proximal great vessels with a possible hemodynamically significant stenosis at the origin of the left subclavian artery. Mediastinum/Nodes: No pathologically enlarged mediastinal, hilar or axillary lymph nodes. No mediastinal masses. Normal-appearing esophagus. Visualized thyroid gland unremarkable. Lungs/Pleura: Emphysematous changes in both lungs. Patchy ground-glass opacities in the right middle lobe. No confluent airspace consolidation. Scarring in the lingula. Pleuroparenchymal scarring in the apices, right greater than left. No pleural effusions. Severe bronchial wall thickening diffusely throughout both lungs, involving all 5 lobes, with narrowing of the right lower lobe bronchus and the left upper lobe bronchus. Upper Abdomen: Relative enlargement of the left lobe of liver compared to the right lobe. Atherosclerosis at the origin of the celiac and SMA without visible stenosis. Visualized upper abdomen otherwise unremarkable. Musculoskeletal: Osseous demineralization. Mild-to-moderate diffuse thoracic spondylosis. Degenerative changes involving the visualized lower cervical spine. Review of the MIP images confirms the above findings. IMPRESSION: 1. No evidence of pulmonary embolism. 2. Severe generalized bronchial wall thickening with narrowing of the right  lower lobe bronchus and the left upper lobe bronchus. 3. COPD/emphysema. Patchy ground-glass opacities in the right middle lobe are likely inflammatory. No acute cardiopulmonary disease otherwise. 4. Query hepatic cirrhosis, as there is relative enlargement of the left lobe of the liver compared to the right lobe. 5. Generalized atherosclerosis, including three-vessel coronary atherosclerosis. Possible hemodynamically significant stenosis at the origin of the left subclavian artery. Electronically Signed   By: Evangeline Dakin M.D.   On: 04/07/2016 20:00   Dg Chest Port 1 View  Result Date: 04/21/2016 CLINICAL DATA:  Respiratory failure. EXAM: PORTABLE CHEST 1  VIEW COMPARISON:  04/20/2016 FINDINGS: Persistent subtle parenchymal densities in the lower lungs, right side greater than left. Minimal change from the recent comparison examination. Upper lungs remain clear. Heart size is within normal limits and stable. Atherosclerotic calcifications at the aortic arch. Negative for a pneumothorax. IMPRESSION: Persistent subtle parenchymal densities in the lower lungs, right side greater than left. Infectious etiology cannot be excluded. Minimal change from the previous examination. Electronically Signed   By: Markus Daft M.D.   On: 04/21/2016 07:54   Dg Chest Port 1 View  Result Date: 04/20/2016 CLINICAL DATA:  Pneumonia EXAM: PORTABLE CHEST 1 VIEW COMPARISON:  04/18/2016 FINDINGS: Cardiac shadow is at the upper limits of normal in size. Aortic calcifications are again seen. Patchy bibasilar changes are again identified and stable. No new focal abnormality is seen. No bony abnormality is noted. IMPRESSION: Stable bibasilar changes. Electronically Signed   By: Inez Catalina M.D.   On: 04/20/2016 07:09   Dg Chest Port 1 View  Result Date: 04/18/2016 CLINICAL DATA:  Cough.  Dyspnea. EXAM: PORTABLE CHEST 1 VIEW COMPARISON:  04/07/2016 chest radiograph. FINDINGS: Stable cardiomediastinal silhouette with top-normal  heart size and aortic atherosclerosis. No pneumothorax. No pleural effusion. Patchy consolidation in the peripheral mid to lower right lung and at the left lung base, which appears new. IMPRESSION: New patchy consolidation in the peripheral mid to lower right lung and at the left lung base, suspicious for multilobar pneumonia. Recommend follow-up PA and lateral post treatment chest radiographs in 4-6 weeks. Aortic atherosclerosis. Electronically Signed   By: Ilona Sorrel M.D.   On: 04/18/2016 16:52     LOS: 3 days   Oren Binet, MD  [facility] Pager:[phone]  If 7PM-7AM, please contact night-coverage [email or web address] Password [facility] 04/21/2016, 11:51 AM

## 2016-04-22 DIAGNOSIS — I10 Essential (primary) hypertension: Secondary | ICD-10-CM

## 2016-04-22 DIAGNOSIS — J189 Pneumonia, unspecified organism: Secondary | ICD-10-CM

## 2016-04-22 LAB — BASIC METABOLIC PANEL
ANION GAP: 11 (ref 5–15)
BUN: 16 mg/dL (ref 6–20)
CALCIUM: 8.6 mg/dL — AB (ref 8.9–10.3)
CO2: 38 mmol/L — ABNORMAL HIGH (ref 22–32)
Chloride: 86 mmol/L — ABNORMAL LOW (ref 101–111)
Creatinine, Ser: 0.53 mg/dL (ref 0.44–1.00)
GFR calc Af Amer: >60 mL/min (ref >60)
Glucose, Bld: 276 mg/dL — ABNORMAL HIGH (ref 65–99)
POTASSIUM: 3.7 mmol/L (ref 3.5–5.1)
SODIUM: 135 mmol/L (ref 135–145)

## 2016-04-22 LAB — GLUCOSE, CAPILLARY
GLUCOSE-CAPILLARY: 288 mg/dL — AB (ref 65–99)
GLUCOSE-CAPILLARY: 220 mg/dL — AB (ref 65–99)
Glucose-Capillary: 363 mg/dL — ABNORMAL HIGH (ref 65–99)
Glucose-Capillary: 294 mg/dL — ABNORMAL HIGH (ref 65–99)

## 2016-04-22 MED ORDER — METHYLPREDNISOLONE SODIUM SUCC 40 MG IJ SOLR
40.0000 mg | Freq: Two times a day (BID) | INTRAMUSCULAR | Status: DC
  Administered 2016-04-22 – 2016-04-23 (×2): 40 mg via INTRAVENOUS
  Filled 2016-04-22 (×2): qty 1

## 2016-04-22 MED ORDER — DIPHENHYDRAMINE HCL 25 MG PO CAPS
50.0000 mg | ORAL_CAPSULE | Freq: Every day | ORAL | Status: DC
  Administered 2016-04-22: 50 mg via ORAL
  Filled 2016-04-22: qty 2

## 2016-04-22 MED ORDER — INSULIN GLARGINE 100 UNIT/ML ~~LOC~~ SOLN
20.0000 [IU] | Freq: Every day | SUBCUTANEOUS | Status: DC
  Administered 2016-04-23: 20 [IU] via SUBCUTANEOUS
  Filled 2016-04-22: qty 0.2

## 2016-04-22 NOTE — Progress Notes (Signed)
Inpatient Diabetes Program Recommendations  AACE/ADA: New Consensus Statement on Inpatient Glycemic Control (2015)  Target Ranges:  Prepandial:   less than 140 mg/dL      Peak postprandial:   less than 180 mg/dL (1-2 hours)      Critically ill patients:  140 - 180 mg/dL  Results for PATTI, SHORB (MRN [id number]) as of 04/22/2016 09:37  Ref. Range 04/21/2016 09:07 04/21/2016 12:51 04/21/2016 17:36 04/21/2016 21:44 04/22/2016 09:30  Glucose-Capillary Latest Ref Range: 65 - 99 mg/dL 286 (H) 195 (H) 216 (H) 308 (H) 288 (H)    Review of Glycemic Control  Diabetes history: DM2 Outpatient Diabetes medications: Byetta 10 mcg BID with meals, Janumet 50-1000 mg BID Current orders for Inpatient glycemic control: Lantus 15 units daily, Novolog 4 units TID with meals, Novolog 0-9 units TID with meals  Inpatient Diabetes Program Recommendations: Correction (SSI): Please consider ordering Novolog 0-5 units QHS for bedtime correction. Insulin - Meal Coverage: If steroids are continued as ordered, please consider increasing meal coverage to Novolog 7 units TID with meals if patient eats at least 50% of meals. Insulin-Basal: If steroids are continued as ordered, please consider increasing Lantus to 18 units daily.  Thanks, Barnie Alderman, RN, MSN, CDE Diabetes Coordinator Inpatient Diabetes Program [phone] (Team Pager from 8am to 5pm)

## 2016-04-22 NOTE — Progress Notes (Signed)
Patient resting comfortably on 3L [place] with Sp02 93%. No respiratory distress noted. BIPAP not needed at this time.

## 2016-04-22 NOTE — Care Management Important Message (Signed)
Important Message  Patient Details  Name: [name] MRN: [id number] Date of Birth: 1945-01-05   Medicare Important Message Given:  Yes    Nathen May 04/22/2016, 12:25 PM

## 2016-04-22 NOTE — Clinical Social Work Note (Signed)
Clinical Social Work Assessment  Patient Details  Name: [name] MRN: [id number] Date of Birth: Aug 09, 1945  Date of referral:  04/22/16               Reason for consult:  Facility Placement                Permission sought to share information with:  Chartered certified accountant granted to share information::  Yes, Verbal Permission Granted  Name::     [name]  Agency::  SNF  Relationship::  brother  Contact Information:     Housing/Transportation Living arrangements for the past 2 months:  Single Family Home Source of Information:  Patient Patient Interpreter Needed:  None Criminal Activity/Legal Involvement Pertinent to Current Situation/Hospitalization:  No - Comment as needed Significant Relationships:  Siblings Lives with:  Self Do you feel safe going back to the place where you live?  No Need for family participation in patient care:  Yes (Comment) (brother helps out with finances etc)  Care giving concerns:  Pt lives at home alone- currently much weaker than normal and does not feel as if she could manage- her brother usually visits frequently but he is out of town and would not be available to help.   Social Worker assessment / plan:  CSW spoke with pt concerning PT recommendation for SNF.  Pt acknowledges she is much weaker than normal- states she has never been to SNF.  CSW explained SNF and SNF referral process.  CSW also explained need for quick decision with pt insurance since we are coming up on the weekend.  Employment status:  Retired Nurse, adult PT Recommendations:  [place] / Referral to community resources:  [place]  Patient/Family's Response to care:  Patient is agreeable to SNF stay when she is stable for DC.  Patient/Family's Understanding of and Emotional Response to Diagnosis, Current Treatment, and Prognosis:  Pt has good understanding of current condition and  is realistic about her need for further assistance when discharged- hopeful her rehab stay will be short and she can return to living independently.  Emotional Assessment Appearance:  Appears stated age Attitude/Demeanor/Rapport:    Affect (typically observed):  Appropriate, Accepting Orientation:  Oriented to Self, Oriented to Place, Oriented to  Time, Oriented to Situation Alcohol / Substance use:  Not Applicable Psych involvement (Current and /or in the community):  No (Comment)  Discharge Needs  Concerns to be addressed:  Care Coordination Readmission within the last 30 days:  No Current discharge risk:  Physical Impairment Barriers to Discharge:  Continued Medical Work up   Jorge Ny, LCSW 04/22/2016, 11:34 AM

## 2016-04-22 NOTE — Progress Notes (Signed)
PCCM Progress Note  Admission date: 04/18/2016  CC: Shortness of breath  Brief Summary:  Consulted on 04/19/16 by Dr. Doyle Askew with [facility] to assess dyspnea.  71 yo female smoker presented to ER with dyspnea, cough, and chest congestion for 1 week prior to admission.  She was noted be hypoxic in ER.  She reported feeling feverish with chills, and cough with yellow sputum.  She was found to have pneumonia and concern for COPD exacerbation.  She was started on ABx, solumedrol, oxygen, and BDs.  She was previously followed by Dr. Melvyn Novas.  She uses 2 liters oxygen at baseline.  She gets short of breath after walking few steps at baseline.  She denies recent sick exposures.  She has been smoking 1 ppd.    She denies chest pain, abdominal pain, nausea, diarrhea, sinus congestion, ear pain, headache, or sore throat.  She has been wheezing.  Denies skin rash or leg swelling.   Subjective: Pt. States she feels better each day. She states she does not feel she is at her baseline status.  On 4L with sats 96%.  No BIPAP through the night.  Vital signs: BP (!) 158/76   Pulse (!) 101   Temp 98.4 F (36.9 C) (Oral)   Resp (!) 24   Ht 5\' 8"  (1.727 m)   Wt 200 lb (90.7 kg)   SpO2 92%   BMI 30.41 kg/m   Intake/output: I/O last 3 completed shifts: In: 2614 [P.O.:2064; IV Piggyback:550] Out: 3050 [Urine:3050]  General:  Awake and alert overweight female, supine in bed with 4 L [place] HEENT: Normocephalic, atraumatic, MM pink and moist, [place] Neuro: Alert and Oriented x 4, MAE x 3, appropriate and following commands CV: RRR, ST per monitor, S1, S2, no RMG PULM: Bilateral chest excursion,BS with Exp. Wheezes noted, rhonchi noted R>L, diminished per bases. GI: Obese, soft, , non-distended, BS +  Extremities: Warm and dry to touch, no edema noted, no deformities noted. Skin: Warm dry and intact without rash or lesions noted.    CMP Latest Ref Rng & Units 04/22/2016 04/21/2016 04/20/2016  Glucose 65 - 99 mg/dL  276(H) 209(H) 213(H)  BUN 6 - 20 mg/dL 16 15 14   Creatinine 0.44 - 1.00 mg/dL 0.53 0.56 0.61  Sodium 135 - 145 mmol/L 135 134(L) 135  Potassium 3.5 - 5.1 mmol/L 3.7 4.0 3.4(L)  Chloride 101 - 111 mmol/L 86(L) 88(L) 90(L)  CO2 22 - 32 mmol/L 38(H) 33(H) 34(H)  Calcium 8.9 - 10.3 mg/dL 8.6(L) 8.3(L) 7.8(L)  Total Protein 6.5 - 8.1 g/dL - - -  Total Bilirubin 0.3 - 1.2 mg/dL - - -  Alkaline Phos 38 - 126 U/L - - -  AST 15 - 41 U/L - - -  ALT 14 - 54 U/L - - -     CBC Latest Ref Rng & Units 04/21/2016 04/20/2016 04/19/2016  WBC 4.0 - 10.5 K/uL 12.8(H) 16.7(H) 22.9(H)  Hemoglobin 12.0 - 15.0 g/dL 10.0(L) 10.2(L) 10.7(L)  Hematocrit 36.0 - 46.0 % 32.1(L) 32.4(L) 33.3(L)  Platelets 150 - 400 K/uL 334 350 346     ABG    Component Value Date/Time   PHART 7.447 (H) 11/04/2009 1305   PCO2ART 43.9 11/04/2009 1305   PO2ART 62.4 (L) 11/04/2009 1305   HCO3 29.8 (H) 11/04/2009 1305   TCO2 31.2 11/04/2009 1305   O2SAT 93.7 11/04/2009 1305     CBG (last 3)   Recent Labs  04/21/16 1736 04/21/16 2144 04/22/16 0930  GLUCAP 216*  308* 288*     Imaging: Dg Chest Port 1 View  Result Date: 04/21/2016 CLINICAL DATA:  Respiratory failure. EXAM: PORTABLE CHEST 1 VIEW COMPARISON:  04/20/2016 FINDINGS: Persistent subtle parenchymal densities in the lower lungs, right side greater than left. Minimal change from the recent comparison examination. Upper lungs remain clear. Heart size is within normal limits and stable. Atherosclerotic calcifications at the aortic arch. Negative for a pneumothorax. IMPRESSION: Persistent subtle parenchymal densities in the lower lungs, right side greater than left. Infectious etiology cannot be excluded. Minimal change from the previous examination. Electronically Signed   By: Markus Daft M.D.   On: 04/21/2016 07:54     Studies: CT angio 04/07/16 >> atherosclerosis, emphysema, patchy GGO RML, lingula scar PFT 02/02/08 >> FEV1 1.21 (45%), FEV1% 51, TLC 5.12 (87%), DLCO  56% CXR 4/17>> Stable bi-basilar changes CXR 4/18 >> patchy lower inflitrates R>L  Antibiotics: Rocephin 4/15 >> Zithromax 4/15 >>  Cultures: Influenza 4/15 >>  Pneumococcal Ag 4/15 >> Respiratory viral panel 4/15 >> Negative to date  Lines/tubes: PIV  Events: 4/15 Admit 4/16 Add prn Bipap 4/17 prn BiPAP  Summary: 71 yo female with acute on chronic respiratory failure from CAP with AECOPD.  Assessment/plan:  Acute on chronic hypoxic respiratory failure 2nd to CAP and AECOPD. - 2L O2 home requirement Tobacco abuse. Continued wheezing Required BIPAP 4/17 early am Plan: - Continue abx 5/x - Continue solumedrol 60mg  q6hr - Continue BDs, brovanna, and increase pulmicort to 0.5 BID - Aggressive pulmonary hygiene with flutter valve and mobilize to OOB - Titrate O2 down to baseline 2L for sats 90-95% - Trend  CXR as needed - Trend fever curve/ WBC  Sepsis with lactic acidosis. Improving leukocytosis/ Afebrile - improving - can decrease IVF - continue to monitor hemodynamics - Trend CBC and Fever curve  DM with continued steroid induced hyperglycemia. - SSI - CBG  AC and HS - Lantus added 4/17 - Consider adding bedtime Novolog  Deconditioning. - will need PT assessment when more stable - Weight loss / Consider dietician consult  DVT prophylaxis - lovenox SUP - protonix Nutrition - regular diet Goals of care - full code  Will need follow up with Pulmonary Office  Upon discharge to establish as outpatient, as patient states she does not see a lung doctor. Re-affirmed with her today she needs to establish with Pulmonary. Consider workup for OSA as outpatient. Will also need PFT's and ambulatory saturation prior to d/c to confirm home needs.  Magdalen Spatz,  AGACNP-BC [facility] Pgr: [phone] 04/22/2016, 10:37 AM

## 2016-04-22 NOTE — Progress Notes (Signed)
PROGRESS NOTE        PATIENT DETAILS Name: [name] Age: 71 y.o. Sex: female Date of Birth: [birth year] Admit Date: 04/18/2016 Admitting Physician Theodis Blaze, MD PCP:Pcp Not In System  Brief Narrative: Patient is a 71 y.o. female with prior history of COPD, tobacco use, hypertension, type 2 diabetes who presented to the hospital with worsening shortness of breath.She was found to have bilateral pneumonia, hypoxic requiring BiPAP-she was empirically started on broad-spectrum antibiotics, and admitted to the hospitalist service for further evaluation and treatment. See below for further details.   Subjective: Did not require BiPAP last night-feels much better. Like comfortably in bed. Less shortness of breath in the past few days  Assessment/Plan: Sepsis secondary to community-acquired multifocal pneumonia: Sepsis pathophysiology has resolved, clinical improvement continues with broad spectrum antimicrobial therapy. Cultures continue to be negative. Urine streptococcal antigen negative.  Acute on chronic hypoxemic respiratory failure: Secondary to multifocal pneumonia and COPD exacerbation. Improving, oxygen requirements are slowly coming down-no longer requiring BiPAP. Will start tapering Solu-Medrol, continue other supportive measures.  COPD exacerbation: Probably provoked by pneumonia. Improving-only a few scattered rhonchi today. Decrease Solu-Medrol, continue bronchodilators.   DM-2 with uncontrolled hyperglycemia: CBGs on the higher side-probably due to steroids-continue Lantus 15 units, 4 units of NovoLog and SSI. We will decrease Medrol-and hopefully CBG should start coming down some. Continue to hold oral hypoglycemic agents, plan to resume on discharge.  Hypertension: BP on the higher side-continue amlodipine and Avapro-hopefully decreasing steroids will improve blood pressure as well. Continue to follow.  Mild hyponatremia: Stable for  follow-up without any further workup.  Tobacco abuse: Counseled regarding the importance of quitting.  DVT Prophylaxis: Prophylactic Lovenox  Code Status: Full code  Family Communication: None at bedside  Disposition Plan: Remain inpatient-transfer out of SDU  Antimicrobial agents: Anti-infectives    Start     Dose/Rate Route Frequency Ordered Stop   04/19/16 1800  cefTRIAXone (ROCEPHIN) 2 g in dextrose 5 % 50 mL IVPB  Status:  Discontinued     2 g 100 mL/hr over 30 Minutes Intravenous Every 24 hours 04/18/16 1711 04/18/16 1923   04/19/16 1800  azithromycin (ZITHROMAX) 500 mg in dextrose 5 % 250 mL IVPB  Status:  Discontinued     500 mg 250 mL/hr over 60 Minutes Intravenous Every 24 hours 04/18/16 1711 04/18/16 1923   04/19/16 1800  cefTRIAXone (ROCEPHIN) 1 g in dextrose 5 % 50 mL IVPB     1 g 100 mL/hr over 30 Minutes Intravenous Every 24 hours 04/18/16 1900 04/26/16 1759   04/18/16 2000  azithromycin (ZITHROMAX) 500 mg in dextrose 5 % 250 mL IVPB     500 mg 250 mL/hr over 60 Minutes Intravenous Every 24 hours 04/18/16 1900 04/25/16 1959   04/18/16 1715  cefTRIAXone (ROCEPHIN) 1 g in dextrose 5 % 50 mL IVPB  Status:  Discontinued     1 g 100 mL/hr over 30 Minutes Intravenous  Once 04/18/16 1703 04/18/16 1711   04/18/16 1715  azithromycin (ZITHROMAX) 500 mg in dextrose 5 % 250 mL IVPB  Status:  Discontinued     500 mg 250 mL/hr over 60 Minutes Intravenous  Once 04/18/16 1703 04/18/16 1920   04/18/16 1715  cefTRIAXone (ROCEPHIN) 2 g in dextrose 5 % 50 mL IVPB     2 g 100 mL/hr over 30  Minutes Intravenous  Once 04/18/16 1711 04/18/16 1839      Procedures: None  CONSULTS:  pulmonary/intensive care  Time spent: 25 minutes-Greater than 50% of this time was spent in counseling, explanation of diagnosis, planning of further management, and coordination of care.  MEDICATIONS: Scheduled Meds: . amLODipine  10 mg Oral Daily  . arformoterol  15 mcg Nebulization BID  .  aspirin EC  81 mg Oral Daily  . budesonide (PULMICORT) nebulizer solution  0.5 mg Nebulization BID  . diphenhydrAMINE  50 mg Oral QHS  . enoxaparin (LOVENOX) injection  40 mg Subcutaneous Q24H  . feeding supplement (ENSURE ENLIVE)  237 mL Oral BID BM  . guaiFENesin  1,200 mg Oral BID  . insulin aspart  0-9 Units Subcutaneous TID WC  . insulin aspart  4 Units Subcutaneous TID WC  . insulin glargine  15 Units Subcutaneous Daily  . ipratropium-albuterol  3 mL Nebulization Q6H  . irbesartan  150 mg Oral Daily  . mouth rinse  15 mL Mouth Rinse BID  . methylPREDNISolone (SOLU-MEDROL) injection  60 mg Intravenous Q8H  . pantoprazole  40 mg Oral Daily  . PARoxetine  30 mg Oral Daily   Continuous Infusions: . azithromycin    . cefTRIAXone (ROCEPHIN)  IV     PRN Meds:.benzonatate, diazepam, hydrALAZINE, ibuprofen, ipratropium-albuterol, simethicone   PHYSICAL EXAM: Vital signs: Vitals:   04/21/16 2349 04/22/16 0148 04/22/16 0350 04/22/16 0750  BP: (!) 168/73  (!) 158/76   Pulse: 98  (!) 101   Resp: (!) 24  (!) 24   Temp: 98.2 F (36.8 C)  98.4 F (36.9 C)   TempSrc: Oral  Oral   SpO2: 95% 93% 94% 92%  Weight:      Height:       Filed Weights   04/18/16 2000  Weight: 90.7 kg (200 lb)   Body mass index is 30.41 kg/m.   General appearance :Awake, alert, not in any distress. Lying comfortably in bed. Eyes:, pupils equally reactive to light and accomodation,no scleral icterus.Pink conjunctiva HEENT: Atraumatic and Normocephalic Neck: supple, no JVD. No cervical lymphadenopathy.  Resp:Good air entry bilaterally, only a few scattered rhonchi CVS: S1 S2 regular, no murmurs.  GI: Bowel sounds present, Non tender and not distended with no gaurding, rigidity or rebound.No organomegaly Extremities: B/L Lower Ext shows no edema, both legs are warm to touch Neurology:  speech clear,Non focal, sensation is grossly intact. Psychiatric: Normal judgment and insight. Alert and oriented x 3.  Normal mood. Musculoskeletal:No digital cyanosis Skin:No Rash, warm and dry Wounds:N/A  I have personally reviewed following labs and imaging studies  LABORATORY DATA: CBC:  Recent Labs Lab 04/18/16 1632 04/19/16 0221 04/20/16 0229 04/21/16 0253  WBC 21.3* 22.9* 16.7* 12.8*  NEUTROABS 18.8*  --   --   --   HGB 11.0* 10.7* 10.2* 10.0*  HCT 34.6* 33.3* 32.4* 32.1*  MCV 77.9* 77.6* 78.8 78.7  PLT 320 346 350 086    Basic Metabolic Panel:  Recent Labs Lab 04/18/16 1632 04/19/16 0221 04/20/16 0229 04/21/16 0253 04/22/16 0228  NA 129* 131* 135 134* 135  K 3.1* 3.4* 3.4* 4.0 3.7  CL 82* 86* 90* 88* 86*  CO2 32 31 34* 33* 38*  GLUCOSE 147* 187* 213* 209* 276*  BUN 21* 22* 14 15 16   CREATININE 0.93 0.80 0.61 0.56 0.53  CALCIUM 7.9* 7.4* 7.8* 8.3* 8.6*    GFR: Estimated Creatinine Clearance: 78.2 mL/min (by C-G formula based on SCr of  0.53 mg/dL).  Liver Function Tests:  Recent Labs Lab 04/18/16 1632  AST 30  ALT 34  ALKPHOS 126  BILITOT 0.8  PROT 6.5  ALBUMIN 2.5*   No results for input(s): LIPASE, AMYLASE in the last 168 hours. No results for input(s): AMMONIA in the last 168 hours.  Coagulation Profile:  Recent Labs Lab 04/18/16 1946  INR 1.18    Cardiac Enzymes: No results for input(s): CKTOTAL, CKMB, CKMBINDEX, TROPONINI in the last 168 hours.  BNP (last 3 results) No results for input(s): PROBNP in the last 8760 hours.  HbA1C:  Recent Labs  04/21/16 0253  HGBA1C 6.0*    CBG:  Recent Labs Lab 04/21/16 0907 04/21/16 1251 04/21/16 1736 04/21/16 2144 04/22/16 0930  GLUCAP 286* 195* 216* 308* 288*    Lipid Profile: No results for input(s): CHOL, HDL, LDLCALC, TRIG, CHOLHDL, LDLDIRECT in the last 72 hours.  Thyroid Function Tests: No results for input(s): TSH, T4TOTAL, FREET4, T3FREE, THYROIDAB in the last 72 hours.  Anemia Panel: No results for input(s): VITAMINB12, FOLATE, FERRITIN, TIBC, IRON, RETICCTPCT in the last 72  hours.  Urine analysis: No results found for: COLORURINE, APPEARANCEUR, LABSPEC, PHURINE, GLUCOSEU, HGBUR, BILIRUBINUR, KETONESUR, PROTEINUR, UROBILINOGEN, NITRITE, LEUKOCYTESUR  Sepsis Labs: Lactic Acid, Venous    Component Value Date/Time   LATICACIDVEN 1.4 04/19/2016 0221    MICROBIOLOGY: Recent Results (from the past 240 hour(s))  Blood Culture (routine x 2)     Status: None (Preliminary result)   Collection Time: 04/18/16  4:35 PM  Result Value Ref Range Status   Specimen Description BLOOD LEFT ANTECUBITAL  Final   Special Requests   Final    BOTTLES DRAWN AEROBIC AND ANAEROBIC Blood Culture adequate volume   Culture NO GROWTH 3 DAYS  Final   Report Status PENDING  Incomplete  Blood Culture (routine x 2)     Status: None (Preliminary result)   Collection Time: 04/18/16  4:40 PM  Result Value Ref Range Status   Specimen Description BLOOD RIGHT ANTECUBITAL  Final   Special Requests   Final    BOTTLES DRAWN AEROBIC AND ANAEROBIC Blood Culture adequate volume   Culture NO GROWTH 3 DAYS  Final   Report Status PENDING  Incomplete  MRSA PCR Screening     Status: None   Collection Time: 04/18/16  6:53 PM  Result Value Ref Range Status   MRSA by PCR NEGATIVE NEGATIVE Final    Comment:        The GeneXpert MRSA Assay (FDA approved for NASAL specimens only), is one component of a comprehensive MRSA colonization surveillance program. It is not intended to diagnose MRSA infection nor to guide or monitor treatment for MRSA infections.   Respiratory Panel by PCR     Status: None   Collection Time: 04/18/16  7:51 PM  Result Value Ref Range Status   Adenovirus NOT DETECTED NOT DETECTED Final   Coronavirus 229E NOT DETECTED NOT DETECTED Final   Coronavirus HKU1 NOT DETECTED NOT DETECTED Final   Coronavirus NL63 NOT DETECTED NOT DETECTED Final   Coronavirus OC43 NOT DETECTED NOT DETECTED Final   Metapneumovirus NOT DETECTED NOT DETECTED Final   Rhinovirus / Enterovirus NOT  DETECTED NOT DETECTED Final   Influenza A NOT DETECTED NOT DETECTED Final   Influenza B NOT DETECTED NOT DETECTED Final   Parainfluenza Virus 1 NOT DETECTED NOT DETECTED Final   Parainfluenza Virus 2 NOT DETECTED NOT DETECTED Final   Parainfluenza Virus 3 NOT DETECTED NOT DETECTED Final  Parainfluenza Virus 4 NOT DETECTED NOT DETECTED Final   Respiratory Syncytial Virus NOT DETECTED NOT DETECTED Final   Bordetella pertussis NOT DETECTED NOT DETECTED Final   Chlamydophila pneumoniae NOT DETECTED NOT DETECTED Final   Mycoplasma pneumoniae NOT DETECTED NOT DETECTED Final    RADIOLOGY STUDIES/RESULTS: Dg Chest 2 View  Result Date: 04/07/2016 CLINICAL DATA:  Shortness of breath and productive cough for the past week. History of COPD and diabetes. Current smoker. EXAM: CHEST  2 VIEW COMPARISON:  PA and lateral chest x-ray of December 09, 2014 FINDINGS: The lungs are adequately inflated. The interstitial markings are coarse though stable. There is no alveolar infiltrate or pleural effusion. The heart and pulmonary vascularity are normal. The mediastinum is normal in width. There is calcification in the wall of the thoracic aorta. The bony thorax exhibits no acute abnormality. IMPRESSION: There is no pneumonia nor CHF nor other acute cardiopulmonary abnormality. Stable chronic bronchitic changes. Thoracic aortic atherosclerosis. Electronically Signed   By: David  Martinique M.D.   On: 04/07/2016 16:09   Ct Angio Chest Pe W Or Wo Contrast  Result Date: 04/07/2016 CLINICAL DATA:  71 year old with an approximate 3-4 day history of progressively worsening shortness of breath. Hypoxia. Current history of diabetes, hypertension, COPD and diastolic dysfunction. EXAM: CT ANGIOGRAPHY CHEST WITH CONTRAST TECHNIQUE: Multidetector CT imaging of the chest was performed using the standard protocol during bolus administration of intravenous contrast. Multiplanar CT image reconstructions and MIPs were obtained to evaluate the  vascular anatomy. CONTRAST:  80 mL Isovue 370 IV. COMPARISON:  No prior CT. Multiple prior chest x-rays, including earlier today. FINDINGS: Cardiovascular: Contrast opacification of the pulmonary arteries is good. Respiratory motion blurred images of the lungs throughout the examination. Overall, the study is of good technical quality. No filling defects within either main pulmonary artery or their branches in either lung to suggest pulmonary embolism. Heart upper normal in size to slightly enlarged. Left ventricular hypertrophy. Moderate three-vessel coronary atherosclerosis. No pericardial effusion. Prominent epicardial fat. Severe atherosclerosis involving the thoracic and upper abdominal aorta without aneurysm or dissection. Atherosclerosis involving the proximal great vessels with a possible hemodynamically significant stenosis at the origin of the left subclavian artery. Mediastinum/Nodes: No pathologically enlarged mediastinal, hilar or axillary lymph nodes. No mediastinal masses. Normal-appearing esophagus. Visualized thyroid gland unremarkable. Lungs/Pleura: Emphysematous changes in both lungs. Patchy ground-glass opacities in the right middle lobe. No confluent airspace consolidation. Scarring in the lingula. Pleuroparenchymal scarring in the apices, right greater than left. No pleural effusions. Severe bronchial wall thickening diffusely throughout both lungs, involving all 5 lobes, with narrowing of the right lower lobe bronchus and the left upper lobe bronchus. Upper Abdomen: Relative enlargement of the left lobe of liver compared to the right lobe. Atherosclerosis at the origin of the celiac and SMA without visible stenosis. Visualized upper abdomen otherwise unremarkable. Musculoskeletal: Osseous demineralization. Mild-to-moderate diffuse thoracic spondylosis. Degenerative changes involving the visualized lower cervical spine. Review of the MIP images confirms the above findings. IMPRESSION: 1. No  evidence of pulmonary embolism. 2. Severe generalized bronchial wall thickening with narrowing of the right lower lobe bronchus and the left upper lobe bronchus. 3. COPD/emphysema. Patchy ground-glass opacities in the right middle lobe are likely inflammatory. No acute cardiopulmonary disease otherwise. 4. Query hepatic cirrhosis, as there is relative enlargement of the left lobe of the liver compared to the right lobe. 5. Generalized atherosclerosis, including three-vessel coronary atherosclerosis. Possible hemodynamically significant stenosis at the origin of the left subclavian artery. Electronically Signed  By: Evangeline Dakin M.D.   On: 04/07/2016 20:00   Dg Chest Port 1 View  Result Date: 04/21/2016 CLINICAL DATA:  Respiratory failure. EXAM: PORTABLE CHEST 1 VIEW COMPARISON:  04/20/2016 FINDINGS: Persistent subtle parenchymal densities in the lower lungs, right side greater than left. Minimal change from the recent comparison examination. Upper lungs remain clear. Heart size is within normal limits and stable. Atherosclerotic calcifications at the aortic arch. Negative for a pneumothorax. IMPRESSION: Persistent subtle parenchymal densities in the lower lungs, right side greater than left. Infectious etiology cannot be excluded. Minimal change from the previous examination. Electronically Signed   By: Markus Daft M.D.   On: 04/21/2016 07:54   Dg Chest Port 1 View  Result Date: 04/20/2016 CLINICAL DATA:  Pneumonia EXAM: PORTABLE CHEST 1 VIEW COMPARISON:  04/18/2016 FINDINGS: Cardiac shadow is at the upper limits of normal in size. Aortic calcifications are again seen. Patchy bibasilar changes are again identified and stable. No new focal abnormality is seen. No bony abnormality is noted. IMPRESSION: Stable bibasilar changes. Electronically Signed   By: Inez Catalina M.D.   On: 04/20/2016 07:09   Dg Chest Port 1 View  Result Date: 04/18/2016 CLINICAL DATA:  Cough.  Dyspnea. EXAM: PORTABLE CHEST 1 VIEW  COMPARISON:  04/07/2016 chest radiograph. FINDINGS: Stable cardiomediastinal silhouette with top-normal heart size and aortic atherosclerosis. No pneumothorax. No pleural effusion. Patchy consolidation in the peripheral mid to lower right lung and at the left lung base, which appears new. IMPRESSION: New patchy consolidation in the peripheral mid to lower right lung and at the left lung base, suspicious for multilobar pneumonia. Recommend follow-up PA and lateral post treatment chest radiographs in 4-6 weeks. Aortic atherosclerosis. Electronically Signed   By: Ilona Sorrel M.D.   On: 04/18/2016 16:52     LOS: 4 days   Oren Binet, MD  [facility] Pager:[phone]  If 7PM-7AM, please contact night-coverage [email or web address] Password [facility] 04/22/2016, 10:48 AM

## 2016-04-22 NOTE — Progress Notes (Signed)
Physical Therapy Treatment Patient Details Name: [name] MRN: [id number] DOB: April 01, 1945 Today's Date: 04/22/2016    History of Present Illness Pt is a 71 y/o female admitted secondary to worsening SOB, found to have sepsis secondary to CAP. Pt on 2L of supplemental O2 at home but has required PRN BiPAP this admission. PMH including but not limited to COPD, DM, HTN and HLD.     PT Comments    Patient progressing slowly due to HR elevation and SpO2 dropping with mobility.  Patient fatigued after up in chair 1 hour and requesting back to bed.  Agree with SNF rehab at d/c.  PT to follow acutely as tolerated.   Follow Up Recommendations  SNF;Supervision/Assistance - 24 hour     Equipment Recommendations  Rolling walker with 5" wheels;3in1 (PT)    Recommendations for Other Services       Precautions / Restrictions Precautions Precautions: Fall Precaution Comments: monitor SPO2, watch HR    Mobility  Bed Mobility Overal bed mobility: Needs Assistance Bed Mobility: Sit to Supine       Sit to supine: Min guard   General bed mobility comments: for positioning and safety  Transfers Overall transfer level: Needs assistance Equipment used: Rolling walker (2 wheeled) Transfers: Sit to/from [facility] Sit to Stand: Min guard Stand pivot transfers: Min assist       General transfer comment: cues for hand placement; assist chair to [facility] without RW  Ambulation/Gait Ambulation/Gait assistance: Min guard Ambulation Distance (Feet): 8 Feet (& 12') Assistive device: Rolling walker (2 wheeled) Gait Pattern/deviations: Step-through pattern;Decreased stride length;Shuffle     General Gait Details: ambulated to foot of bed and c/o dizziness with HR up to 132, then sat to rest, up to [facility], then ambulated around bed to get in on opposite side of bed, HR up to 144 while straining to urinate on BSC, SpO2 down as low as 85% on 3L O2   Stairs             Wheelchair Mobility    Modified Rankin (Stroke Patients Only)       Balance Overall balance assessment: Needs assistance Sitting-balance support: Feet supported Sitting balance-Leahy Scale: Good     Standing balance support: During functional activity;Bilateral upper extremity supported Standing balance-Leahy Scale: Poor Standing balance comment: pt reliant on bilateral UEs on RW, or for assist with transfers                            Cognition Arousal/Alertness: Awake/alert Behavior During Therapy: WFL for tasks assessed/performed Overall Cognitive Status: Within Functional Limits for tasks assessed                                        Exercises      General Comments        Pertinent Vitals/Pain Pain Assessment: No/denies pain    Home Living                      Prior Function            PT Goals (current goals can now be found in the care plan section) Progress towards PT goals: Progressing toward goals    Frequency    Min 3X/week      PT Plan Current plan remains appropriate    Co-evaluation  End of Session Equipment Utilized During Treatment: Gait belt;Oxygen Activity Tolerance: Patient limited by fatigue;Treatment limited secondary to medical complications (Comment) Patient left: in bed;with call bell/phone within reach   PT Visit Diagnosis: Other abnormalities of gait and mobility (R26.89);Muscle weakness (generalized) (M62.81)     Time: 1545-1610 PT Time Calculation (min) (ACUTE ONLY): 25 min  Charges:  $Gait Training: 8-22 mins $Therapeutic Activity: 8-22 mins                    G CodesMagda Kiel, [place] [phone] 04/22/2016    Reginia Naas 04/22/2016, 5:43 PM

## 2016-04-23 ENCOUNTER — Inpatient Hospital Stay (HOSPITAL_COMMUNITY): Payer: Medicare HMO

## 2016-04-23 DIAGNOSIS — J189 Pneumonia, unspecified organism: Secondary | ICD-10-CM | POA: Diagnosis not present

## 2016-04-23 DIAGNOSIS — F17200 Nicotine dependence, unspecified, uncomplicated: Secondary | ICD-10-CM

## 2016-04-23 DIAGNOSIS — E785 Hyperlipidemia, unspecified: Secondary | ICD-10-CM | POA: Diagnosis not present

## 2016-04-23 DIAGNOSIS — R69 Illness, unspecified: Secondary | ICD-10-CM | POA: Diagnosis not present

## 2016-04-23 DIAGNOSIS — J449 Chronic obstructive pulmonary disease, unspecified: Secondary | ICD-10-CM | POA: Diagnosis not present

## 2016-04-23 DIAGNOSIS — I10 Essential (primary) hypertension: Secondary | ICD-10-CM | POA: Diagnosis not present

## 2016-04-23 DIAGNOSIS — J9610 Chronic respiratory failure, unspecified whether with hypoxia or hypercapnia: Secondary | ICD-10-CM | POA: Diagnosis not present

## 2016-04-23 DIAGNOSIS — J9690 Respiratory failure, unspecified, unspecified whether with hypoxia or hypercapnia: Secondary | ICD-10-CM | POA: Diagnosis not present

## 2016-04-23 DIAGNOSIS — R6520 Severe sepsis without septic shock: Secondary | ICD-10-CM | POA: Diagnosis not present

## 2016-04-23 DIAGNOSIS — J984 Other disorders of lung: Secondary | ICD-10-CM | POA: Diagnosis not present

## 2016-04-23 DIAGNOSIS — J9621 Acute and chronic respiratory failure with hypoxia: Secondary | ICD-10-CM | POA: Diagnosis not present

## 2016-04-23 DIAGNOSIS — E119 Type 2 diabetes mellitus without complications: Secondary | ICD-10-CM | POA: Diagnosis not present

## 2016-04-23 DIAGNOSIS — Z794 Long term (current) use of insulin: Secondary | ICD-10-CM | POA: Diagnosis not present

## 2016-04-23 DIAGNOSIS — A419 Sepsis, unspecified organism: Secondary | ICD-10-CM | POA: Diagnosis not present

## 2016-04-23 DIAGNOSIS — K219 Gastro-esophageal reflux disease without esophagitis: Secondary | ICD-10-CM | POA: Diagnosis not present

## 2016-04-23 DIAGNOSIS — J441 Chronic obstructive pulmonary disease with (acute) exacerbation: Secondary | ICD-10-CM | POA: Diagnosis not present

## 2016-04-23 DIAGNOSIS — Z9981 Dependence on supplemental oxygen: Secondary | ICD-10-CM | POA: Diagnosis not present

## 2016-04-23 DIAGNOSIS — E1169 Type 2 diabetes mellitus with other specified complication: Secondary | ICD-10-CM | POA: Diagnosis not present

## 2016-04-23 LAB — CBC
HCT: 34.1 % — ABNORMAL LOW (ref 36.0–46.0)
HEMOGLOBIN: 10.7 g/dL — AB (ref 12.0–15.0)
MCH: 24.6 pg — ABNORMAL LOW (ref 26.0–34.0)
MCHC: 31.4 g/dL (ref 30.0–36.0)
MCV: 78.4 fL (ref 78.0–100.0)
PLATELETS: 295 10*3/uL (ref 150–400)
RBC: 4.35 MIL/uL (ref 3.87–5.11)
RDW: 16.4 % — ABNORMAL HIGH (ref 11.5–15.5)
WBC: 11.3 10*3/uL — ABNORMAL HIGH (ref 4.0–10.5)

## 2016-04-23 LAB — GLUCOSE, CAPILLARY
GLUCOSE-CAPILLARY: 182 mg/dL — AB (ref 65–99)
GLUCOSE-CAPILLARY: 372 mg/dL — AB (ref 65–99)

## 2016-04-23 MED ORDER — PREDNISONE 10 MG PO TABS: ORAL_TABLET | ORAL | 0 refills | Status: DC

## 2016-04-23 MED ORDER — INSULIN ASPART 100 UNIT/ML ~~LOC~~ SOLN: 4.0000 [IU] | Freq: Three times a day (TID) | SUBCUTANEOUS | 11 refills | Status: DC

## 2016-04-23 MED ORDER — ALBUTEROL SULFATE (2.5 MG/3ML) 0.083% IN NEBU: 2.5000 mg | INHALATION_SOLUTION | RESPIRATORY_TRACT | 12 refills | Status: DC | PRN

## 2016-04-23 MED ORDER — INSULIN ASPART 100 UNIT/ML ~~LOC~~ SOLN: SUBCUTANEOUS | 11 refills | Status: DC

## 2016-04-23 MED ORDER — BUDESONIDE 0.5 MG/2ML IN SUSP: 0.5000 mg | Freq: Two times a day (BID) | RESPIRATORY_TRACT | 12 refills | Status: DC

## 2016-04-23 MED ORDER — IPRATROPIUM-ALBUTEROL 0.5-2.5 (3) MG/3ML IN SOLN
3.0000 mL | Freq: Three times a day (TID) | RESPIRATORY_TRACT | Status: DC
  Administered 2016-04-23: 3 mL via RESPIRATORY_TRACT
  Filled 2016-04-23 (×2): qty 3

## 2016-04-23 MED ORDER — INSULIN GLARGINE 100 UNIT/ML ~~LOC~~ SOLN: 20.0000 [IU] | Freq: Every day | SUBCUTANEOUS | 11 refills | Status: DC

## 2016-04-23 MED ORDER — GUAIFENESIN ER 600 MG PO TB12: 1200.0000 mg | ORAL_TABLET | Freq: Two times a day (BID) | ORAL | Status: DC

## 2016-04-23 MED ORDER — IPRATROPIUM-ALBUTEROL 0.5-2.5 (3) MG/3ML IN SOLN: 3.0000 mL | Freq: Three times a day (TID) | RESPIRATORY_TRACT | Status: DC

## 2016-04-23 MED ORDER — LEVOFLOXACIN 750 MG PO TABS: 750.0000 mg | ORAL_TABLET | Freq: Every day | ORAL | 0 refills | Status: AC

## 2016-04-23 MED ORDER — POLYETHYLENE GLYCOL 3350 17 G PO PACK: 17.0000 g | PACK | Freq: Every day | ORAL | 0 refills | Status: DC | PRN

## 2016-04-23 MED ORDER — ENSURE ENLIVE PO LIQD: 237.0000 mL | Freq: Two times a day (BID) | ORAL | 12 refills | Status: DC

## 2016-04-23 MED ORDER — ARFORMOTEROL TARTRATE 15 MCG/2ML IN NEBU: 15.0000 ug | INHALATION_SOLUTION | Freq: Two times a day (BID) | RESPIRATORY_TRACT | Status: DC

## 2016-04-23 NOTE — Clinical Social Work Placement (Signed)
   CLINICAL SOCIAL WORK PLACEMENT  NOTE  Date:  04/23/2016  Patient Details  Name: [name] MRN: [id number] Date of Birth: 09/10/1945  Clinical Social Work is seeking post-discharge placement for this patient at the [place] level of care (*CSW will initial, date and re-position this form in  chart as items are completed):  Yes   Patient/family provided with [place] Work Department's list of facilities offering this level of care within the geographic area requested by the patient (or if unable, by the patient's family).  Yes   Patient/family informed of their freedom to choose among providers that offer the needed level of care, that participate in Medicare, Medicaid or managed care program needed by the patient, have an available bed and are willing to accept the patient.  Yes   Patient/family informed of [facility]'s ownership interest in [facility] and [facility], as well as of the fact that they are under no obligation to receive care at these facilities.  PASRR submitted to EDS on 04/21/16     PASRR number received on 04/21/16     Existing PASRR number confirmed on       FL2 transmitted to all facilities in geographic area requested by pt/family on 04/21/16     FL2 transmitted to all facilities within larger geographic area on       Patient informed that his/her managed care company has contracts with or will negotiate with certain facilities, including the following:        Yes   Patient/family informed of bed offers received.  Patient chooses bed at [place]     Physician recommends and patient chooses bed at      Patient to be transferred to [facility] on 04/23/16.  Patient to be transferred to facility by [facility]     Patient family notified on 04/23/16 of transfer.  Name of family member notified:  Brother     PHYSICIAN       Additional Comment:     _______________________________________________ Benard Halsted, [place] 04/23/2016, 10:05 AM

## 2016-04-23 NOTE — Discharge Summary (Signed)
PATIENT DETAILS Name: [name] Age: 71 y.o. Sex: female Date of Birth: 31-Oct-1945 MRN: [id number]. Admitting Physician: Theodis Blaze, MD PCP:Pcp Not In System  Admit Date: 04/18/2016 Discharge date: 04/23/2016  Recommendations for Outpatient Follow-up:  1. Follow up with PCP in 1-2 weeks 2. Please obtain BMP/CBC in one week 3. Please repeat Chest XRay in 3-4 weeks to document resolution of PNA 4. Suspect can be slowly tapered back to home regimen of Byetta/Metformin/Januvia in the next weeks or so-currently on Insulin. MD at SNF to evalute 5. Please follow up on the following pending results:blood cultures until final. 6. CBGs before meals and at bedtime 7. Query hepatic cirrhosis seen on CT chest-defer further work up to the outpatient setting. .  Admitted From:  Home  Disposition: SNF   Home Health: No  Equipment/Devices:  oxygen 2-3 L (home regimen)  Discharge Condition: Stable  CODE STATUS: FULL CODE  Diet recommendation:  Heart Healthy / Carb Modified  Brief Summary: See H&P, Labs, Consult and Test reports for all details in brief,Patient is a 71 y.o. female with prior history of COPD, tobacco use, hypertension, type 2 diabetes who presented to the hospital with worsening shortness of breath.She was found to have bilateral pneumonia, hypoxic requiring BiPAP-she was empirically started on broad-spectrum antibiotics, and admitted to the hospitalist service for further evaluation and treatment. See below for further details  Brief Hospital Course: Sepsis secondary to community-acquired multifocal pneumonia: Sepsis pathophysiology has resolved, clinical improvement continues with broad spectrum antimicrobial therapy. Cultures continue to be negative. Urine streptococcal antigen negative.On Rocephin and Zithromax while inpatient-will transition to Levaquin on discharge. Please repeat CXR in 3-4 weeks to document resolution of PNA  Acute on chronic  hypoxemic respiratory failure: Secondary to multifocal pneumonia and COPD exacerbation.Initially required BiPAP-but with clinical improvement, has been transitioned to 3 L of O2 via [place]. Note on Home O2-2-3 L/m  COPD exacerbation: Probably provoked by pneumonia.Rapidly improving over the past few days-hardly any scattered rhonchi today. Stop Solu-Medrol, transition to tapering prednisone-continue bronchodilators.   DM-2 with uncontrolled hyperglycemia: CBGs slowly-probably due to steroids-continue Lantus 20 units, 4 units of NovoLog and SSI. Suspect can be slowly tapered back to home regimen of Byetta/Metformin/Januvia in the next weeks or so. For now would continue with Insulin-but will will resume oral hypoglycemics on discharge.  Hypertension: BP on the higher side-continue amlodipine and Avapro-hopefully decreasing steroids will improve blood pressure as well. Continue to follow closely at SNF  Mild hyponatremia: Stable for follow-up without any further workup.  Tobacco abuse: Counseled regarding the importance of quitting.Does not desire nicotine patch at this time.  Query hepatic cirrhosis : seen incidentally on CT chest-there is relative enlargement of the left lobe of the liver compared to the right lobe.Defer further work up to the outpatient setting.   Procedures/Studies: None  Discharge Diagnoses:  Principal Problem:   Sepsis due to pneumonia [facility]) Active Problems:   Essential hypertension   Chronic respiratory failure (HCC)   COPD exacerbation (HCC)   Smoker   Community acquired pneumonia  Discharge Instructions:  Check CBG's AC AND HS   Activity:  As tolerated with Full fall precautions use walker/cane & assistance as needed   Discharge Instructions    Call MD for:  difficulty breathing, headache or visual disturbances    Complete by:  As directed    Diet - low sodium heart healthy    Complete by:  As directed    Increase activity slowly  Complete by:  As  directed      Allergies as of 04/23/2016   No Known Allergies     Medication List    STOP taking these medications   ADVIL SINUS CONGESTION & PAIN 10-200 MG Tabs Generic drug:  Phenylephrine-Ibuprofen   budesonide-formoterol 160-4.5 MCG/ACT inhaler Commonly known as:  SYMBICORT   exenatide 10 MCG/0.04ML Sopn injection Commonly known as:  BYETTA     TAKE these medications   albuterol 108 (90 Base) MCG/ACT inhaler Commonly known as:  PROAIR HFA Inhale 2 puffs into the lungs every 4 (four) hours as needed. What changed:  reasons to take this   albuterol (2.5 MG/3ML) 0.083% nebulizer solution Commonly known as:  PROVENTIL Take 3 mLs (2.5 mg total) by nebulization every 2 (two) hours as needed for wheezing or shortness of breath. What changed:  You were already taking a medication with the same name, and this prescription was added. Make sure you understand how and when to take each.   amLODipine 5 MG tablet Commonly known as:  NORVASC Take 5 mg by mouth daily.   amLODipine-valsartan 5-160 MG tablet Commonly known as:  EXFORGE Take 1 tablet by mouth daily.   arformoterol 15 MCG/2ML Nebu Commonly known as:  BROVANA Take 2 mLs (15 mcg total) by nebulization 2 (two) times daily.   aspirin EC 81 MG tablet Take 81 mg by mouth daily.   atorvastatin 20 MG tablet Commonly known as:  LIPITOR Take 1 tablet (20 mg total) by mouth daily. What changed:  when to take this   budesonide 0.5 MG/2ML nebulizer solution Commonly known as:  PULMICORT Take 2 mLs (0.5 mg total) by nebulization 2 (two) times daily.   diazepam 5 MG tablet Commonly known as:  VALIUM Take 1/2 tab twice daily as needed foranxiety What changed:  how much to take  how to take this  when to take this  reasons to take this  additional instructions   diphenhydrAMINE 25 MG tablet Commonly known as:  BENADRYL Take 50 mg by mouth at bedtime.   feeding supplement (ENSURE ENLIVE) Liqd Take 237 mLs by  mouth 2 (two) times daily between meals.   guaiFENesin 600 MG 12 hr tablet Commonly known as:  MUCINEX Take 2 tablets (1,200 mg total) by mouth 2 (two) times daily.   ibuprofen 200 MG tablet Commonly known as:  ADVIL,MOTRIN Take 400 mg by mouth every 6 (six) hours as needed for headache (pain).   insulin aspart 100 UNIT/ML injection Commonly known as:  novoLOG 0-9 Units, Subcutaneous, 3 times daily with meals CBG < 70: implement hypoglycemia protocol CBG 70 - 120: 0 units CBG 121 - 150: 1 unit CBG 151 - 200: 2 units CBG 201 - 250: 3 units CBG 251 - 300: 5 units CBG 301 - 350: 7 units CBG 351 - 400: 9 units CBG > 400: call MD   insulin aspart 100 UNIT/ML injection Commonly known as:  novoLOG Inject 4 Units into the skin 3 (three) times daily with meals.   insulin glargine 100 UNIT/ML injection Commonly known as:  LANTUS Inject 0.2 mLs (20 Units total) into the skin daily.   ipratropium-albuterol 0.5-2.5 (3) MG/3ML Soln Commonly known as:  DUONEB Take 3 mLs by nebulization 3 (three) times daily.   levofloxacin 750 MG tablet Commonly known as:  LEVAQUIN Take 1 tablet (750 mg total) by mouth daily. For 3 more days from 4/20   multivitamin with minerals Tabs tablet Take 1 tablet by  mouth daily.   omeprazole 20 MG capsule Commonly known as:  PRILOSEC Take 1 tablet by mouth daily.   OXYGEN Inhale 2 L into the lungs continuous.   PARoxetine 30 MG tablet Commonly known as:  PAXIL Take 30 mg by mouth daily.   polyethylene glycol packet Commonly known as:  MIRALAX Take 17 g by mouth daily as needed.   predniSONE 10 MG tablet Commonly known as:  DELTASONE Take 4 tablets (40 mg) daily for 2 days, then, Take 3 tablets (30 mg) daily for 2 days, then, Take 2 tablets (20 mg) daily for 2 days, then, Take 1 tablets (10 mg) daily for 1 days, then stop   sitaGLIPtin-metformin 50-1000 MG tablet Commonly known as:  JANUMET Take 1 tablet by mouth 2 (two) times daily with a meal.       Follow-up Information    Primary MD. Schedule an appointment as soon as possible for a visit in 1 week(s).          No Known Allergies  Consultations:   pulmonary/intensive care  Other Procedures/Studies: Dg Chest 2 View  Result Date: 04/07/2016 CLINICAL DATA:  Shortness of breath and productive cough for the past week. History of COPD and diabetes. Current smoker. EXAM: CHEST  2 VIEW COMPARISON:  PA and lateral chest x-ray of December 09, 2014 FINDINGS: The lungs are adequately inflated. The interstitial markings are coarse though stable. There is no alveolar infiltrate or pleural effusion. The heart and pulmonary vascularity are normal. The mediastinum is normal in width. There is calcification in the wall of the thoracic aorta. The bony thorax exhibits no acute abnormality. IMPRESSION: There is no pneumonia nor CHF nor other acute cardiopulmonary abnormality. Stable chronic bronchitic changes. Thoracic aortic atherosclerosis. Electronically Signed   By: David  Martinique M.D.   On: 04/07/2016 16:09   Ct Angio Chest Pe W Or Wo Contrast  Result Date: 04/07/2016 CLINICAL DATA:  71 year old with an approximate 3-4 day history of progressively worsening shortness of breath. Hypoxia. Current history of diabetes, hypertension, COPD and diastolic dysfunction. EXAM: CT ANGIOGRAPHY CHEST WITH CONTRAST TECHNIQUE: Multidetector CT imaging of the chest was performed using the standard protocol during bolus administration of intravenous contrast. Multiplanar CT image reconstructions and MIPs were obtained to evaluate the vascular anatomy. CONTRAST:  80 mL Isovue 370 IV. COMPARISON:  No prior CT. Multiple prior chest x-rays, including earlier today. FINDINGS: Cardiovascular: Contrast opacification of the pulmonary arteries is good. Respiratory motion blurred images of the lungs throughout the examination. Overall, the study is of good technical quality. No filling defects within either main pulmonary artery or  their branches in either lung to suggest pulmonary embolism. Heart upper normal in size to slightly enlarged. Left ventricular hypertrophy. Moderate three-vessel coronary atherosclerosis. No pericardial effusion. Prominent epicardial fat. Severe atherosclerosis involving the thoracic and upper abdominal aorta without aneurysm or dissection. Atherosclerosis involving the proximal great vessels with a possible hemodynamically significant stenosis at the origin of the left subclavian artery. Mediastinum/Nodes: No pathologically enlarged mediastinal, hilar or axillary lymph nodes. No mediastinal masses. Normal-appearing esophagus. Visualized thyroid gland unremarkable. Lungs/Pleura: Emphysematous changes in both lungs. Patchy ground-glass opacities in the right middle lobe. No confluent airspace consolidation. Scarring in the lingula. Pleuroparenchymal scarring in the apices, right greater than left. No pleural effusions. Severe bronchial wall thickening diffusely throughout both lungs, involving all 5 lobes, with narrowing of the right lower lobe bronchus and the left upper lobe bronchus. Upper Abdomen: Relative enlargement of the left lobe of  liver compared to the right lobe. Atherosclerosis at the origin of the celiac and SMA without visible stenosis. Visualized upper abdomen otherwise unremarkable. Musculoskeletal: Osseous demineralization. Mild-to-moderate diffuse thoracic spondylosis. Degenerative changes involving the visualized lower cervical spine. Review of the MIP images confirms the above findings. IMPRESSION: 1. No evidence of pulmonary embolism. 2. Severe generalized bronchial wall thickening with narrowing of the right lower lobe bronchus and the left upper lobe bronchus. 3. COPD/emphysema. Patchy ground-glass opacities in the right middle lobe are likely inflammatory. No acute cardiopulmonary disease otherwise. 4. Query hepatic cirrhosis, as there is relative enlargement of the left lobe of the liver  compared to the right lobe. 5. Generalized atherosclerosis, including three-vessel coronary atherosclerosis. Possible hemodynamically significant stenosis at the origin of the left subclavian artery. Electronically Signed   By: Evangeline Dakin M.D.   On: 04/07/2016 20:00   Dg Chest Port 1 View  Result Date: 04/23/2016 CLINICAL DATA:  Respiratory failure EXAM: PORTABLE CHEST 1 VIEW COMPARISON:  04/21/2016 FINDINGS: COPD. Heart size within normal limits without heart failure. Atherosclerotic aortic arch. Mild bibasilar airspace disease shows interval improvement. No new area of infiltrate or effusion IMPRESSION: COPD.  Continued improvement in bibasilar airspace disease. Electronically Signed   By: Franchot Gallo M.D.   On: 04/23/2016 08:11   Dg Chest Port 1 View  Result Date: 04/21/2016 CLINICAL DATA:  Respiratory failure. EXAM: PORTABLE CHEST 1 VIEW COMPARISON:  04/20/2016 FINDINGS: Persistent subtle parenchymal densities in the lower lungs, right side greater than left. Minimal change from the recent comparison examination. Upper lungs remain clear. Heart size is within normal limits and stable. Atherosclerotic calcifications at the aortic arch. Negative for a pneumothorax. IMPRESSION: Persistent subtle parenchymal densities in the lower lungs, right side greater than left. Infectious etiology cannot be excluded. Minimal change from the previous examination. Electronically Signed   By: Markus Daft M.D.   On: 04/21/2016 07:54   Dg Chest Port 1 View  Result Date: 04/20/2016 CLINICAL DATA:  Pneumonia EXAM: PORTABLE CHEST 1 VIEW COMPARISON:  04/18/2016 FINDINGS: Cardiac shadow is at the upper limits of normal in size. Aortic calcifications are again seen. Patchy bibasilar changes are again identified and stable. No new focal abnormality is seen. No bony abnormality is noted. IMPRESSION: Stable bibasilar changes. Electronically Signed   By: Inez Catalina M.D.   On: 04/20/2016 07:09   Dg Chest Port 1  View  Result Date: 04/18/2016 CLINICAL DATA:  Cough.  Dyspnea. EXAM: PORTABLE CHEST 1 VIEW COMPARISON:  04/07/2016 chest radiograph. FINDINGS: Stable cardiomediastinal silhouette with top-normal heart size and aortic atherosclerosis. No pneumothorax. No pleural effusion. Patchy consolidation in the peripheral mid to lower right lung and at the left lung base, which appears new. IMPRESSION: New patchy consolidation in the peripheral mid to lower right lung and at the left lung base, suspicious for multilobar pneumonia. Recommend follow-up PA and lateral post treatment chest radiographs in 4-6 weeks. Aortic atherosclerosis. Electronically Signed   By: Ilona Sorrel M.D.   On: 04/18/2016 16:52     TODAY-DAY OF DISCHARGE:  Subjective:   Siana Panameno today has no headache,no chest abdominal pain,no new weakness tingling or numbness, feels much better wants to go home today.   Objective:   Blood pressure (!) 167/82, pulse 96, temperature 98 F (36.7 C), resp. rate 18, height 5\' 8"  (1.727 m), weight 90.7 kg (200 lb), SpO2 90 %.  Intake/Output Summary (Last 24 hours) at 04/23/16 0958 Last data filed at 04/23/16 0726  Gross per  24 hour  Intake              530 ml  Output             2375 ml  Net            -1845 ml   Filed Weights   04/18/16 2000  Weight: 90.7 kg (200 lb)    Exam: Awake Alert, Oriented *3, No new F.N deficits, Normal affect [place].AT,PERRAL Supple Neck,No JVD, No cervical lymphadenopathy appriciated.  Symmetrical Chest wall movement, Good air movement bilaterally, CTAB RRR,No Gallops,Rubs or new Murmurs, No Parasternal Heave +ve B.Sounds, Abd Soft, Non tender, No organomegaly appriciated, No rebound -guarding or rigidity. No Cyanosis, Clubbing or edema, No new Rash or bruise   PERTINENT RADIOLOGIC STUDIES: Dg Chest 2 View  Result Date: 04/07/2016 CLINICAL DATA:  Shortness of breath and productive cough for the past week. History of COPD and diabetes. Current smoker.  EXAM: CHEST  2 VIEW COMPARISON:  PA and lateral chest x-ray of December 09, 2014 FINDINGS: The lungs are adequately inflated. The interstitial markings are coarse though stable. There is no alveolar infiltrate or pleural effusion. The heart and pulmonary vascularity are normal. The mediastinum is normal in width. There is calcification in the wall of the thoracic aorta. The bony thorax exhibits no acute abnormality. IMPRESSION: There is no pneumonia nor CHF nor other acute cardiopulmonary abnormality. Stable chronic bronchitic changes. Thoracic aortic atherosclerosis. Electronically Signed   By: David  Martinique M.D.   On: 04/07/2016 16:09   Ct Angio Chest Pe W Or Wo Contrast  Result Date: 04/07/2016 CLINICAL DATA:  71 year old with an approximate 3-4 day history of progressively worsening shortness of breath. Hypoxia. Current history of diabetes, hypertension, COPD and diastolic dysfunction. EXAM: CT ANGIOGRAPHY CHEST WITH CONTRAST TECHNIQUE: Multidetector CT imaging of the chest was performed using the standard protocol during bolus administration of intravenous contrast. Multiplanar CT image reconstructions and MIPs were obtained to evaluate the vascular anatomy. CONTRAST:  80 mL Isovue 370 IV. COMPARISON:  No prior CT. Multiple prior chest x-rays, including earlier today. FINDINGS: Cardiovascular: Contrast opacification of the pulmonary arteries is good. Respiratory motion blurred images of the lungs throughout the examination. Overall, the study is of good technical quality. No filling defects within either main pulmonary artery or their branches in either lung to suggest pulmonary embolism. Heart upper normal in size to slightly enlarged. Left ventricular hypertrophy. Moderate three-vessel coronary atherosclerosis. No pericardial effusion. Prominent epicardial fat. Severe atherosclerosis involving the thoracic and upper abdominal aorta without aneurysm or dissection. Atherosclerosis involving the proximal great  vessels with a possible hemodynamically significant stenosis at the origin of the left subclavian artery. Mediastinum/Nodes: No pathologically enlarged mediastinal, hilar or axillary lymph nodes. No mediastinal masses. Normal-appearing esophagus. Visualized thyroid gland unremarkable. Lungs/Pleura: Emphysematous changes in both lungs. Patchy ground-glass opacities in the right middle lobe. No confluent airspace consolidation. Scarring in the lingula. Pleuroparenchymal scarring in the apices, right greater than left. No pleural effusions. Severe bronchial wall thickening diffusely throughout both lungs, involving all 5 lobes, with narrowing of the right lower lobe bronchus and the left upper lobe bronchus. Upper Abdomen: Relative enlargement of the left lobe of liver compared to the right lobe. Atherosclerosis at the origin of the celiac and SMA without visible stenosis. Visualized upper abdomen otherwise unremarkable. Musculoskeletal: Osseous demineralization. Mild-to-moderate diffuse thoracic spondylosis. Degenerative changes involving the visualized lower cervical spine. Review of the MIP images confirms the above findings. IMPRESSION: 1. No evidence of  pulmonary embolism. 2. Severe generalized bronchial wall thickening with narrowing of the right lower lobe bronchus and the left upper lobe bronchus. 3. COPD/emphysema. Patchy ground-glass opacities in the right middle lobe are likely inflammatory. No acute cardiopulmonary disease otherwise. 4. Query hepatic cirrhosis, as there is relative enlargement of the left lobe of the liver compared to the right lobe. 5. Generalized atherosclerosis, including three-vessel coronary atherosclerosis. Possible hemodynamically significant stenosis at the origin of the left subclavian artery. Electronically Signed   By: Evangeline Dakin M.D.   On: 04/07/2016 20:00   Dg Chest Port 1 View  Result Date: 04/23/2016 CLINICAL DATA:  Respiratory failure EXAM: PORTABLE CHEST 1 VIEW  COMPARISON:  04/21/2016 FINDINGS: COPD. Heart size within normal limits without heart failure. Atherosclerotic aortic arch. Mild bibasilar airspace disease shows interval improvement. No new area of infiltrate or effusion IMPRESSION: COPD.  Continued improvement in bibasilar airspace disease. Electronically Signed   By: Franchot Gallo M.D.   On: 04/23/2016 08:11   Dg Chest Port 1 View  Result Date: 04/21/2016 CLINICAL DATA:  Respiratory failure. EXAM: PORTABLE CHEST 1 VIEW COMPARISON:  04/20/2016 FINDINGS: Persistent subtle parenchymal densities in the lower lungs, right side greater than left. Minimal change from the recent comparison examination. Upper lungs remain clear. Heart size is within normal limits and stable. Atherosclerotic calcifications at the aortic arch. Negative for a pneumothorax. IMPRESSION: Persistent subtle parenchymal densities in the lower lungs, right side greater than left. Infectious etiology cannot be excluded. Minimal change from the previous examination. Electronically Signed   By: Markus Daft M.D.   On: 04/21/2016 07:54   Dg Chest Port 1 View  Result Date: 04/20/2016 CLINICAL DATA:  Pneumonia EXAM: PORTABLE CHEST 1 VIEW COMPARISON:  04/18/2016 FINDINGS: Cardiac shadow is at the upper limits of normal in size. Aortic calcifications are again seen. Patchy bibasilar changes are again identified and stable. No new focal abnormality is seen. No bony abnormality is noted. IMPRESSION: Stable bibasilar changes. Electronically Signed   By: Inez Catalina M.D.   On: 04/20/2016 07:09   Dg Chest Port 1 View  Result Date: 04/18/2016 CLINICAL DATA:  Cough.  Dyspnea. EXAM: PORTABLE CHEST 1 VIEW COMPARISON:  04/07/2016 chest radiograph. FINDINGS: Stable cardiomediastinal silhouette with top-normal heart size and aortic atherosclerosis. No pneumothorax. No pleural effusion. Patchy consolidation in the peripheral mid to lower right lung and at the left lung base, which appears new. IMPRESSION:  New patchy consolidation in the peripheral mid to lower right lung and at the left lung base, suspicious for multilobar pneumonia. Recommend follow-up PA and lateral post treatment chest radiographs in 4-6 weeks. Aortic atherosclerosis. Electronically Signed   By: Ilona Sorrel M.D.   On: 04/18/2016 16:52     PERTINENT LAB RESULTS: CBC:  Recent Labs  04/21/16 0253 04/23/16 0444  WBC 12.8* 11.3*  HGB 10.0* 10.7*  HCT 32.1* 34.1*  PLT 334 295   CMET CMP     Component Value Date/Time   NA 135 04/22/2016 0228   K 3.7 04/22/2016 0228   CL 86 (L) 04/22/2016 0228   CO2 38 (H) 04/22/2016 0228   GLUCOSE 276 (H) 04/22/2016 0228   BUN 16 04/22/2016 0228   CREATININE 0.53 04/22/2016 0228   CALCIUM 8.6 (L) 04/22/2016 0228   PROT 6.5 04/18/2016 1632   ALBUMIN 2.5 (L) 04/18/2016 1632   AST 30 04/18/2016 1632   ALT 34 04/18/2016 1632   ALKPHOS 126 04/18/2016 1632   BILITOT 0.8 04/18/2016 1632   GFRNONAA >  60 04/22/2016 0228   GFRAA >60 04/22/2016 0228    GFR Estimated Creatinine Clearance: 78.2 mL/min (by C-G formula based on SCr of 0.53 mg/dL). No results for input(s): LIPASE, AMYLASE in the last 72 hours. No results for input(s): CKTOTAL, CKMB, CKMBINDEX, TROPONINI in the last 72 hours. Invalid input(s): POCBNP No results for input(s): DDIMER in the last 72 hours.  Recent Labs  04/21/16 0253  HGBA1C 6.0*   No results for input(s): CHOL, HDL, LDLCALC, TRIG, CHOLHDL, LDLDIRECT in the last 72 hours. No results for input(s): TSH, T4TOTAL, T3FREE, THYROIDAB in the last 72 hours.  Invalid input(s): FREET3 No results for input(s): VITAMINB12, FOLATE, FERRITIN, TIBC, IRON, RETICCTPCT in the last 72 hours. Coags: No results for input(s): INR in the last 72 hours.  Invalid input(s): PT Microbiology: Recent Results (from the past 240 hour(s))  Blood Culture (routine x 2)     Status: None (Preliminary result)   Collection Time: 04/18/16  4:35 PM  Result Value Ref Range Status    Specimen Description BLOOD LEFT ANTECUBITAL  Final   Special Requests   Final    BOTTLES DRAWN AEROBIC AND ANAEROBIC Blood Culture adequate volume   Culture NO GROWTH 4 DAYS  Final   Report Status PENDING  Incomplete  Blood Culture (routine x 2)     Status: None (Preliminary result)   Collection Time: 04/18/16  4:40 PM  Result Value Ref Range Status   Specimen Description BLOOD RIGHT ANTECUBITAL  Final   Special Requests   Final    BOTTLES DRAWN AEROBIC AND ANAEROBIC Blood Culture adequate volume   Culture NO GROWTH 4 DAYS  Final   Report Status PENDING  Incomplete  MRSA PCR Screening     Status: None   Collection Time: 04/18/16  6:53 PM  Result Value Ref Range Status   MRSA by PCR NEGATIVE NEGATIVE Final    Comment:        The GeneXpert MRSA Assay (FDA approved for NASAL specimens only), is one component of a comprehensive MRSA colonization surveillance program. It is not intended to diagnose MRSA infection nor to guide or monitor treatment for MRSA infections.   Respiratory Panel by PCR     Status: None   Collection Time: 04/18/16  7:51 PM  Result Value Ref Range Status   Adenovirus NOT DETECTED NOT DETECTED Final   Coronavirus 229E NOT DETECTED NOT DETECTED Final   Coronavirus HKU1 NOT DETECTED NOT DETECTED Final   Coronavirus NL63 NOT DETECTED NOT DETECTED Final   Coronavirus OC43 NOT DETECTED NOT DETECTED Final   Metapneumovirus NOT DETECTED NOT DETECTED Final   Rhinovirus / Enterovirus NOT DETECTED NOT DETECTED Final   Influenza A NOT DETECTED NOT DETECTED Final   Influenza B NOT DETECTED NOT DETECTED Final   Parainfluenza Virus 1 NOT DETECTED NOT DETECTED Final   Parainfluenza Virus 2 NOT DETECTED NOT DETECTED Final   Parainfluenza Virus 3 NOT DETECTED NOT DETECTED Final   Parainfluenza Virus 4 NOT DETECTED NOT DETECTED Final   Respiratory Syncytial Virus NOT DETECTED NOT DETECTED Final   Bordetella pertussis NOT DETECTED NOT DETECTED Final   Chlamydophila  pneumoniae NOT DETECTED NOT DETECTED Final   Mycoplasma pneumoniae NOT DETECTED NOT DETECTED Final    FURTHER DISCHARGE INSTRUCTIONS:  Get Medicines reviewed and adjusted: Please take all your medications with you for your next visit with your Primary MD  Laboratory/radiological data: Please request your Primary MD to go over all hospital tests and procedure/radiological results at the  follow up, please ask your Primary MD to get all Hospital records sent to his/her office.  In some cases, they will be blood work, cultures and biopsy results pending at the time of your discharge. Please request that your primary care M.D. goes through all the records of your hospital data and follows up on these results.  Also Note the following: If you experience worsening of your admission symptoms, develop shortness of breath, life threatening emergency, suicidal or homicidal thoughts you must seek medical attention immediately by calling 911 or calling your MD immediately  if symptoms less severe.  You must read complete instructions/literature along with all the possible adverse reactions/side effects for all the Medicines you take and that have been prescribed to you. Take any new Medicines after you have completely understood and accpet all the possible adverse reactions/side effects.   Do not drive when taking Pain medications or sleeping medications (Benzodaizepines)  Do not take more than prescribed Pain, Sleep and Anxiety Medications. It is not advisable to combine anxiety,sleep and pain medications without talking with your primary care practitioner  Special Instructions: If you have smoked or chewed Tobacco  in the last 2 yrs please stop smoking, stop any regular Alcohol  and or any Recreational drug use.  Wear Seat belts while driving.  Please note: You were cared for by a hospitalist during your hospital stay. Once you are discharged, your primary care physician will handle any further  medical issues. Please note that NO REFILLS for any discharge medications will be authorized once you are discharged, as it is imperative that you return to your primary care physician (or establish a relationship with a primary care physician if you do not have one) for your post hospital discharge needs so that they can reassess your need for medications and monitor your lab values.  Total Time spent coordinating discharge including counseling, education and face to face time equals 45 minutes.  SignedOren Binet 04/23/2016 9:58 AM

## 2016-04-23 NOTE — Care Management Note (Signed)
Case Management Note  Patient Details  Name: [name] MRN: [id number] Date of Birth: [birth year]  Subjective/Objective:                    Action/Plan:  DC to SNF today as facilitated by CSW.  Expected Discharge Date:  04/23/16               Expected Discharge Plan:  [place]  In-House Referral:     Discharge planning Services  CM Consult  Post Acute Care Choice:    Choice offered to:     DME Arranged:    DME Agency:     HH Arranged:    [place] Agency:     Status of Service:  Completed, signed off  If discussed at [employer], dates discussed:    Additional Comments:  Carles Collet, RN 04/23/2016, 10:36 AM

## 2016-04-23 NOTE — Progress Notes (Signed)
Patient will DC to: [facility] Anticipated DC date: 04/23/16 Family notified: Brother Transport by: Corey Harold   Per MD patient ready for DC to [facility]. RN, patient, patient's family, and facility notified of DC. Discharge Summary sent to facility. RN given number for report. DC packet on chart. Ambulance transport requested for patient.   CSW signing off.  Cedric Fishman, [place] Social Worker [phone]

## 2016-04-23 NOTE — Consult Note (Signed)
           [name] CM Primary Care Navigator  04/23/2016  [name] [birth year] [id number]   Went to see patient at the bedside to identify possible discharge needs but patient was already discharged per RN.  Chart review reveals the patient was discharged to [facility] facility for short term rehabilitation.   Primary care provider is unattributed both in Epic and [employer] (All Payers List).  For questions, please contact:  Dannielle Huh, BSN, RN- [facility] Primary Care Navigator  Telephone: [phone] [place]

## 2016-04-23 NOTE — Progress Notes (Signed)
Report called to [facility]. Patient transported via EMS to [facility] for Rehab.  Patient had no voiced complaints.  AVS and prescription for Prednisone sent with transporters.

## 2016-04-24 LAB — CULTURE, BLOOD (ROUTINE X 2)
CULTURE: NO GROWTH
CULTURE: NO GROWTH
Special Requests: ADEQUATE
Special Requests: ADEQUATE

## 2016-04-26 ENCOUNTER — Other Ambulatory Visit: Payer: Self-pay

## 2016-04-26 MED ORDER — DIAZEPAM 5 MG PO TABS: 2.5000 mg | ORAL_TABLET | Freq: Two times a day (BID) | ORAL | 0 refills | Status: DC

## 2016-04-26 NOTE — Telephone Encounter (Signed)
RX faxed to [facility] fax # [phone] Phone #[phone] 

## 2016-04-27 ENCOUNTER — Non-Acute Institutional Stay (SKILLED_NURSING_FACILITY): Payer: Medicare HMO | Admitting: Adult Health

## 2016-04-27 ENCOUNTER — Encounter: Payer: Self-pay | Admitting: Adult Health

## 2016-04-27 DIAGNOSIS — A419 Sepsis, unspecified organism: Secondary | ICD-10-CM | POA: Diagnosis not present

## 2016-04-27 DIAGNOSIS — I10 Essential (primary) hypertension: Secondary | ICD-10-CM | POA: Diagnosis not present

## 2016-04-27 DIAGNOSIS — J441 Chronic obstructive pulmonary disease with (acute) exacerbation: Secondary | ICD-10-CM

## 2016-04-27 DIAGNOSIS — J189 Pneumonia, unspecified organism: Secondary | ICD-10-CM | POA: Diagnosis not present

## 2016-04-27 DIAGNOSIS — R69 Illness, unspecified: Secondary | ICD-10-CM | POA: Diagnosis not present

## 2016-04-27 DIAGNOSIS — F17200 Nicotine dependence, unspecified, uncomplicated: Secondary | ICD-10-CM

## 2016-04-27 NOTE — Progress Notes (Signed)
Location:   [place] Room Number: 381 O FBPZW of Service:  SNF (31)    CODE STATUS: Full Code  No Known Allergies  Chief Complaint  Patient presents with  . Discharge Note    Discharge AMA    HPI:  She had been hospitalized for sepsis secondary pneumonia; acute on chronic respiratory failure. She has been admitted to this facility for short term rehab. She is insisting on going home in AM. Myself the care plan team and her brother have informed her that this will be against medical advise. She is aware of this situation.  She will need home health for pt/ot/rn/cna/sw. She will need a 3:1 commode; neb machine  and a standard wheelchair. Her prescriptions will need to be written and will need to follow up with her medical provider.     Past Medical History:  Diagnosis Date  . COPD (chronic obstructive pulmonary disease) ([place])   . Diabetes mellitus   . Diastolic dysfunction    history  . Hyperlipidemia   . Hypertension     Past Surgical History:  Procedure Laterality Date  . TUBAL LIGATION      Social History   Social History  . Marital status: Widowed    Spouse name: N/A  . Number of children: N/A  . Years of education: N/A   Occupational History  .      Worked in home   Social History Main Topics  . Smoking status: Current Every Day Smoker    Packs/day: 0.50  . Smokeless tobacco: Never Used  . Alcohol use Not on file  . Drug use: Unknown  . Sexual activity: Not on file   Other Topics Concern  . Not on file   Social History Narrative  . No narrative on file   Family History  Problem Relation Age of Onset  . Heart disease Father   . Lung cancer Father     VITAL SIGNS BP (!) 123/56   Pulse (!) 102   Temp 97.4 F (36.3 C)   Resp 18   Ht 5\' 10"  (1.778 m)   Wt 197 lb 6.4 oz (89.5 kg)   SpO2 92%   BMI 28.32 kg/m   Patient's Medications  New Prescriptions   No medications on file  Previous Medications   ALBUTEROL (PROVENTIL  HFA;VENTOLIN HFA) 108 (90 BASE) MCG/ACT INHALER    Inhale 2 puffs into the lungs every 4 (four) hours as needed for wheezing or shortness of breath.   ALBUTEROL (PROVENTIL) (2.5 MG/3ML) 0.083% NEBULIZER SOLUTION    Take 3 mLs (2.5 mg total) by nebulization every 2 (two) hours as needed for wheezing or shortness of breath.   AMLODIPINE (NORVASC) 5 MG TABLET    Take 5 mg by mouth daily.   AMLODIPINE-VALSARTAN (EXFORGE) 5-160 MG TABLET    Take 1 tablet by mouth daily.   ARFORMOTEROL (BROVANA) 15 MCG/2ML NEBU    Take 2 mLs (15 mcg total) by nebulization 2 (two) times daily.   ASPIRIN EC 81 MG TABLET    Take 81 mg by mouth daily.   ATORVASTATIN (LIPITOR) 20 MG TABLET    Take 20 mg by mouth every evening.   BUDESONIDE (PULMICORT) 0.5 MG/2ML NEBULIZER SOLUTION    Take 2 mLs (0.5 mg total) by nebulization 2 (two) times daily.   DIAZEPAM (VALIUM) 5 MG TABLET    Take 0.5 tablets (2.5 mg total) by mouth every 12 (twelve) hours.   DIPHENHYDRAMINE (BENADRYL) 25 MG TABLET  Take 50 mg by mouth at bedtime.    GUAIFENESIN (MUCINEX) 600 MG 12 HR TABLET    Take 2 tablets (1,200 mg total) by mouth 2 (two) times daily.   IBUPROFEN (ADVIL,MOTRIN) 200 MG TABLET    Take 400 mg by mouth every 6 (six) hours as needed for headache (pain).   INSULIN ASPART (NOVOLOG) 100 UNIT/ML INJECTION    0-9 Units, Subcutaneous, 3 times daily with meals CBG < 70: implement hypoglycemia protocol CBG 70 - 120: 0 units CBG 121 - 150: 1 unit CBG 151 - 200: 2 units CBG 201 - 250: 3 units CBG 251 - 300: 5 units CBG 301 - 350: 7 units CBG 351 - 400: 9 units CBG > 400: call MD   INSULIN ASPART (NOVOLOG) 100 UNIT/ML INJECTION    Inject 4 Units into the skin 3 (three) times daily with meals.   INSULIN GLARGINE (LANTUS) 100 UNIT/ML INJECTION    Inject 20 Units into the skin at bedtime.   IPRATROPIUM-ALBUTEROL (DUONEB) 0.5-2.5 (3) MG/3ML SOLN    Take 3 mLs by nebulization 3 (three) times daily.   MULTIPLE VITAMIN (MULTIVITAMIN WITH MINERALS)  TABS TABLET    Take 1 tablet by mouth daily.   OMEPRAZOLE (PRILOSEC) 20 MG CAPSULE    Take 1 tablet by mouth daily.   OXYGEN    Inhale 3 L into the lungs continuous.    PAROXETINE (PAXIL) 30 MG TABLET    Take 30 mg by mouth daily.    POLYETHYLENE GLYCOL (MIRALAX) PACKET    Take 17 g by mouth daily as needed.   PREDNISONE (DELTASONE) 10 MG TABLET    Take 4 tablets (40 mg) daily for 2 days, then, Take 3 tablets (30 mg) daily for 2 days, then, Take 2 tablets (20 mg) daily for 2 days, then, Take 1 tablets (10 mg) daily for 1 days, then stop   SITAGLIPTAN-METFORMIN (JANUMET) 50-1000 MG PER TABLET    Take 1 tablet by mouth 2 (two) times daily with a meal.    Modified Medications   No medications on file  Discontinued Medications   ALBUTEROL (PROAIR HFA) 108 (90 BASE) MCG/ACT INHALER    Inhale 2 puffs into the lungs every 4 (four) hours as needed.   ATORVASTATIN (LIPITOR) 20 MG TABLET    Take 1 tablet (20 mg total) by mouth daily.   FEEDING SUPPLEMENT, ENSURE ENLIVE, (ENSURE ENLIVE) LIQD    Take 237 mLs by mouth 2 (two) times daily between meals.   INSULIN GLARGINE (LANTUS) 100 UNIT/ML INJECTION    Inject 0.2 mLs (20 Units total) into the skin daily.     SIGNIFICANT DIAGNOSTIC EXAMS   Review of Systems  Constitutional: Negative for malaise/fatigue.  Respiratory: Negative for cough and shortness of breath.   Cardiovascular: Negative for chest pain, palpitations and leg swelling.  Gastrointestinal: Negative for abdominal pain, constipation and heartburn.  Musculoskeletal: Negative for back pain, joint pain and myalgias.  Skin: Negative.   Neurological: Negative for dizziness.  Psychiatric/Behavioral: The patient is not nervous/anxious.     Physical Exam  Constitutional: She is oriented to person, place, and time. No distress.  Eyes: Conjunctivae are normal.  Neck: Neck supple. No JVD present. No thyromegaly present.  Cardiovascular: Normal rate, regular rhythm and intact distal pulses.     Respiratory: Effort normal. No respiratory distress. She has no wheezes.  Breath sounds diminished Is 02 dependent   GI: Soft. Bowel sounds are normal. She exhibits no distension. There is no  tenderness.  Musculoskeletal: She exhibits no edema.  Able to move all extremities   Lymphadenopathy:    She has no cervical adenopathy.  Neurological: She is alert and oriented to person, place, and time.  Skin: Skin is warm and dry. She is not diaphoretic.  Psychiatric: She has a normal mood and affect.     ASSESSMENT/ PLAN:  Patient is being discharged with the following home health services:  Pt/ot/rn/cna/sw: to evaluate and treat as indicated for gait; balance; strength; adl training; medication management; adl care; and community resources   Patient is being discharged with the following durable medical equipment:  3:1 commode; neb machine; standard wheel chair 20 x 18 to with elevated leg rests; wheelchair cushion; anti-tippers; brake extensions to allow her to maintain her current level of independence with her adls which cannot be achieved with a walker. Can self propel   Patient has been advised to f/u with their PCP in 1-2 weeks to bring them up to date on their rehab stay.  Social services at facility was responsible for arranging this appointment.  Pt was provided with a 30 day supply of prescriptions for medications and refills must be obtained from their PCP.  For controlled substances, a more limited supply may be provided adequate until PCP appointment only.#5 valium 5 mg tabs    Time spent with patient  50  minutes >50% time spent counseling; reviewing medical record; tests; labs; and developing future plan of care   Ok Edwards NP [facility] Adult Medicine  Contact [phone] Monday through Friday 8am- 5pm  After hours call [phone]

## 2016-04-28 ENCOUNTER — Non-Acute Institutional Stay (SKILLED_NURSING_FACILITY): Payer: Medicare HMO | Admitting: Adult Health

## 2016-04-28 ENCOUNTER — Encounter: Payer: Self-pay | Admitting: Adult Health

## 2016-04-28 DIAGNOSIS — E1169 Type 2 diabetes mellitus with other specified complication: Secondary | ICD-10-CM

## 2016-04-28 DIAGNOSIS — R69 Illness, unspecified: Secondary | ICD-10-CM | POA: Diagnosis not present

## 2016-04-28 DIAGNOSIS — E119 Type 2 diabetes mellitus without complications: Secondary | ICD-10-CM

## 2016-04-28 DIAGNOSIS — K219 Gastro-esophageal reflux disease without esophagitis: Secondary | ICD-10-CM

## 2016-04-28 DIAGNOSIS — J9610 Chronic respiratory failure, unspecified whether with hypoxia or hypercapnia: Secondary | ICD-10-CM | POA: Diagnosis not present

## 2016-04-28 DIAGNOSIS — J441 Chronic obstructive pulmonary disease with (acute) exacerbation: Secondary | ICD-10-CM

## 2016-04-28 DIAGNOSIS — I10 Essential (primary) hypertension: Secondary | ICD-10-CM

## 2016-04-28 DIAGNOSIS — Z9981 Dependence on supplemental oxygen: Secondary | ICD-10-CM | POA: Diagnosis not present

## 2016-04-28 DIAGNOSIS — Z794 Long term (current) use of insulin: Secondary | ICD-10-CM

## 2016-04-28 DIAGNOSIS — E785 Hyperlipidemia, unspecified: Secondary | ICD-10-CM

## 2016-04-28 DIAGNOSIS — F17200 Nicotine dependence, unspecified, uncomplicated: Secondary | ICD-10-CM

## 2016-04-28 NOTE — Progress Notes (Signed)
Location:   [place] Room Number: 379 K WIOXB of Service:  SNF (31)   CODE STATUS: Full Code  No Known Allergies  Chief Complaint  Patient presents with  . Hospitalization Follow-up    hospital follow up    HPI:  She has been hospitalized for sepsis secondary to multifocal pneumonia; acute on chronic respiratory failure and copd exacerbation. She is here for short term rehab with her goal to return at that time.  She was demanding to leave yesterday with an ama discharge; but has decided to stay at this time.    Past Medical History:  Diagnosis Date  . COPD (chronic obstructive pulmonary disease) ([place])   . Diabetes mellitus   . Diastolic dysfunction    history  . Hyperlipidemia   . Hypertension     Past Surgical History:  Procedure Laterality Date  . TUBAL LIGATION      Social History   Social History  . Marital status: Widowed    Spouse name: N/A  . Number of children: N/A  . Years of education: N/A   Occupational History  .      Worked in home   Social History Main Topics  . Smoking status: Current Every Day Smoker    Packs/day: 0.50  . Smokeless tobacco: Never Used  . Alcohol use Not on file  . Drug use: Unknown  . Sexual activity: Not on file   Other Topics Concern  . Not on file   Social History Narrative  . No narrative on file   Family History  Problem Relation Age of Onset  . Heart disease Father   . Lung cancer Father       VITAL SIGNS BP (!) 123/56   Pulse (!) 102   Temp 97.4 F (36.3 C)   Resp 18   Ht 5\' 10"  (1.778 m)   Wt 197 lb 6.4 oz (89.5 kg)   SpO2 92%   BMI 28.32 kg/m   Patient's Medications  New Prescriptions   No medications on file  Previous Medications   ALBUTEROL (PROVENTIL HFA;VENTOLIN HFA) 108 (90 BASE) MCG/ACT INHALER    Inhale 2 puffs into the lungs every 4 (four) hours as needed for wheezing or shortness of breath.   ALBUTEROL (PROVENTIL) (2.5 MG/3ML) 0.083% NEBULIZER SOLUTION    Take 3  mLs (2.5 mg total) by nebulization every 2 (two) hours as needed for wheezing or shortness of breath.   AMLODIPINE (NORVASC) 5 MG TABLET    Take 5 mg by mouth daily.   AMLODIPINE-VALSARTAN (EXFORGE) 5-160 MG TABLET    Take 1 tablet by mouth daily.   ARFORMOTEROL (BROVANA) 15 MCG/2ML NEBU    Take 2 mLs (15 mcg total) by nebulization 2 (two) times daily.   ASPIRIN EC 81 MG TABLET    Take 81 mg by mouth daily.   ATORVASTATIN (LIPITOR) 20 MG TABLET    Take 20 mg by mouth every evening.   BUDESONIDE (PULMICORT) 0.5 MG/2ML NEBULIZER SOLUTION    Take 2 mLs (0.5 mg total) by nebulization 2 (two) times daily.   DIAZEPAM (VALIUM) 5 MG TABLET    Take 0.5 tablets (2.5 mg total) by mouth every 12 (twelve) hours.   DIPHENHYDRAMINE (BENADRYL) 25 MG TABLET    Take 50 mg by mouth at bedtime.    GUAIFENESIN (MUCINEX) 600 MG 12 HR TABLET    Take 2 tablets (1,200 mg total) by mouth 2 (two) times daily.   IBUPROFEN (ADVIL,MOTRIN) 200 MG  TABLET    Take 400 mg by mouth every 6 (six) hours as needed for headache (pain).   INSULIN ASPART (NOVOLOG) 100 UNIT/ML INJECTION    0-9 Units, Subcutaneous, 3 times daily with meals CBG < 70: implement hypoglycemia protocol CBG 70 - 120: 0 units CBG 121 - 150: 1 unit CBG 151 - 200: 2 units CBG 201 - 250: 3 units CBG 251 - 300: 5 units CBG 301 - 350: 7 units CBG 351 - 400: 9 units CBG > 400: call MD   INSULIN ASPART (NOVOLOG) 100 UNIT/ML INJECTION    Inject 4 Units into the skin 3 (three) times daily with meals.   INSULIN GLARGINE (LANTUS) 100 UNIT/ML INJECTION    Inject 20 Units into the skin at bedtime.   IPRATROPIUM-ALBUTEROL (DUONEB) 0.5-2.5 (3) MG/3ML SOLN    Take 3 mLs by nebulization 3 (three) times daily.   MULTIPLE VITAMIN (MULTIVITAMIN WITH MINERALS) TABS TABLET    Take 1 tablet by mouth daily.   OMEPRAZOLE (PRILOSEC) 20 MG CAPSULE    Take 1 tablet by mouth daily.   OXYGEN    Inhale 3 L into the lungs continuous.    PAROXETINE (PAXIL) 30 MG TABLET    Take 30 mg by  mouth daily.    POLYETHYLENE GLYCOL (MIRALAX) PACKET    Take 17 g by mouth daily as needed.   PREDNISONE (DELTASONE) 10 MG TABLET    Take 4 tablets (40 mg) daily for 2 days, then, Take 3 tablets (30 mg) daily for 2 days, then, Take 2 tablets (20 mg) daily for 2 days, then, Take 1 tablets (10 mg) daily for 1 days, then stop   SITAGLIPTAN-METFORMIN (JANUMET) 50-1000 MG PER TABLET    Take 1 tablet by mouth 2 (two) times daily with a meal.    Modified Medications   No medications on file  Discontinued Medications   No medications on file     SIGNIFICANT DIAGNOSTIC EXAMS  04-07-16: ct angio of chest: 1. No evidence of pulmonary embolism. 2. Severe generalized bronchial wall thickening with narrowing of the right lower lobe bronchus and the left upper lobe bronchus. 3. COPD/emphysema. Patchy ground-glass opacities in the right middle lobe are likely inflammatory. No acute cardiopulmonary disease otherwise. 4. Query hepatic cirrhosis, as there is relative enlargement of the left lobe of the liver compared to the right lobe. 5. Generalized atherosclerosis, including three-vessel coronary atherosclerosis. Possible hemodynamically significant stenosis at the origin of the left subclavian artery.  04-18-16: chest x-ray: New patchy consolidation in the peripheral mid to lower right lung and at the left lung base, suspicious for multilobar pneumonia. Recommend follow-up PA and lateral post treatment chest radiographs in 4-6 weeks.  04-21-16: chest x-ray:Persistent subtle parenchymal densities in the lower lungs, right side greater than left. Infectious etiology cannot be excluded. Minimal change from the previous examination.   04-23-16: chest x-ray: COPD.  Continued improvement in bibasilar airspace disease.    LABS REVIEWED:   04-18-16: wbc 21.3; hgb 11.0; hct 34.6; mcv 77.9; plt 320; glucose 147; bun 21; creat 0.93; k+ 3.1; na++ 129; liver normal albumin 2.5; blood cultures: no growth 04-20-16:  wbc 16.7; hgb 10.2; hct 32.4; mcv 78.8; plt 350; glucose 213; bun 14; creat 0.61; k+ 3.4; na++ 135 04-21-16: wbc 12.8; hgb 10.0; hct 32.1; mcv 78.7 ;plt 334; glucose 109; bun 15; creat 0.56; k+ 4.0; na++ 134; hgb a1c 6.0 04-23-16: wbc 11.3; hgb 10.7; hct 34.1; mcv 78.4; plt 295  Review of Systems  Constitutional: Negative for malaise/fatigue.  Respiratory: Negative for cough and shortness of breath.   Cardiovascular: Negative for chest pain, palpitations and leg swelling.  Gastrointestinal: Negative for abdominal pain, constipation and heartburn.  Musculoskeletal: Negative for back pain, joint pain and myalgias.  Skin: Negative.   Neurological: Negative for dizziness.  Psychiatric/Behavioral: The patient is not nervous/anxious.     Physical Exam  Constitutional: She is oriented to person, place, and time. No distress.  Eyes: Conjunctivae are normal.  Neck: Neck supple. No JVD present. No thyromegaly present.  Cardiovascular: Normal rate, regular rhythm and intact distal pulses.   Respiratory: Effort normal. No respiratory distress. She has no wheezes.  Breath sounds diminished Is 02 dependent   GI: Soft. Bowel sounds are normal. She exhibits no distension. There is no tenderness.  Musculoskeletal: She exhibits no edema.  Able to move all extremities   Lymphadenopathy:    She has no cervical adenopathy.  Neurological: She is alert and oriented to person, place, and time.  Skin: Skin is warm and dry. She is not diaphoretic.  Psychiatric: She has a normal mood and affect.     ASSESSMENT/ PLAN:  1. Hypertension: b/p 123/56: will continue norvasc 5 mg daily and exforge 5/160 mg daily asa 81 mg daily   2. Dyslipidemia: will continue lipitor 20 mg daily  3. COPD has chronic respiratory failure: is 02 dependent: will continue albuterol neb every 2 hours as needed or albuterol 2 puffs every 4 hours as needed; brovana 15 mg neb twice daily pulmicort 0.5 mg neb twice daily mucinex  1200 mg twice daily duoneb three times daily will complete prednisone taper  4. Diabetes: hgb a1c 6.0; will continue lantus 20 units nightly Novolog 4 units with meals AND SSI; 121-150: 1 units; 151-200: 2 units; 201-250: 3 units; 251-300: 5 units; 301-350: 7 units; 351-400: 9 units; janumet 05/998 mg twice daily   5. gerd: will continue prilosec 20 mg daily  6. Smoker: will use nicotine patch 21 mg daily   Will check cbc; bmp Will repeat chest x-ray in 4 weeks Will setup GI consult for query hepatic cirrhosis     Time spent with patient  50  minutes >50% time spent counseling; reviewing medical record; tests; labs; and developing future plan of care   MD is aware of resident's narcotic use and is in agreement with current plan of care. We will attempt to wean resident as apropriate   Ok Edwards NP [facility]  Contact [phone] Monday through Friday 8am- 5pm  After hours call [phone]

## 2016-04-29 ENCOUNTER — Non-Acute Institutional Stay (SKILLED_NURSING_FACILITY): Payer: Medicare HMO | Admitting: Internal Medicine

## 2016-04-29 ENCOUNTER — Encounter: Payer: Self-pay | Admitting: Internal Medicine

## 2016-04-29 DIAGNOSIS — F17200 Nicotine dependence, unspecified, uncomplicated: Secondary | ICD-10-CM

## 2016-04-29 DIAGNOSIS — A419 Sepsis, unspecified organism: Secondary | ICD-10-CM

## 2016-04-29 DIAGNOSIS — J441 Chronic obstructive pulmonary disease with (acute) exacerbation: Secondary | ICD-10-CM

## 2016-04-29 DIAGNOSIS — E785 Hyperlipidemia, unspecified: Secondary | ICD-10-CM

## 2016-04-29 DIAGNOSIS — J189 Pneumonia, unspecified organism: Secondary | ICD-10-CM

## 2016-04-29 DIAGNOSIS — R69 Illness, unspecified: Secondary | ICD-10-CM | POA: Diagnosis not present

## 2016-04-29 DIAGNOSIS — E119 Type 2 diabetes mellitus without complications: Secondary | ICD-10-CM | POA: Diagnosis not present

## 2016-04-29 DIAGNOSIS — I10 Essential (primary) hypertension: Secondary | ICD-10-CM

## 2016-04-29 DIAGNOSIS — Z794 Long term (current) use of insulin: Secondary | ICD-10-CM | POA: Diagnosis not present

## 2016-04-29 DIAGNOSIS — E1169 Type 2 diabetes mellitus with other specified complication: Secondary | ICD-10-CM

## 2016-04-29 NOTE — Progress Notes (Signed)
Provider:  Virgie Dad, MD Location:  [facility] Room Number: 245 B Place of Service:  SNF ([phone])  PCP: Eloise Levels, NP Patient Care Team: Eloise Levels, NP as PCP - General (Nurse Practitioner)  Extended Emergency Contact Information Primary Emergency Contact: [facility] Address: [place], [place] Phone: [phone] Relation: Brother  Code Status: Full Code Goals of Care: Advanced Directive information Advanced Directives 04/29/2016  Does Patient Have a Medical Advance Directive? Yes  Type of Advance Directive Out of facility DNR (pink MOST or yellow form)  Does patient want to make changes to medical advance directive? No - Patient declined  Copy of [place] in Chart? -  Pre-existing out of facility DNR order (yellow form or pink MOST form) Pink MOST form placed in chart (order not valid for inpatient use)      Chief Complaint  Patient presents with  . New Admit To SNF    Admission    HPI: Patient is a 71 y.o. female seen today for admission to SNF for therapy. Patient has h/o COPD oxygen dependent, Still continues to smoke, non Compliance, hypertension and Diabetes Type 2   She was admitted to Hospital with sepsis secondary to Multi focal pneumonia. She was treated with broad spectrum Antibiotics and then switched to Levaquin. She also needed Prednisone Taper to Treat her COPD exacerbation. She eventually was tapered to 3 L of Oxygen and was discharged to SNF for therapy.  Patient denies any complains and just wants to go home as soon as she can. She was SOB while talking to me. Also had cough with sputum but denied both to me. D/W Nurses patient wants to go home as she wants to smoke. She lives by herself and her Brother helps her. But her brother thinks he would not be able to help her in this situation as she is still very dependent.  Past Medical History:    Diagnosis Date  . COPD (chronic obstructive pulmonary disease) ([place])   . Diabetes mellitus   . Diastolic dysfunction    history  . Hyperlipidemia   . Hypertension    Past Surgical History:  Procedure Laterality Date  . TUBAL LIGATION      reports that she has been smoking.  She has been smoking about 0.50 packs per day. She has never used smokeless tobacco. Her alcohol and drug histories are not on file. Social History   Social History  . Marital status: Widowed    Spouse name: N/A  . Number of children: N/A  . Years of education: N/A   Occupational History  .      Worked in home   Social History Main Topics  . Smoking status: Current Every Day Smoker    Packs/day: 0.50  . Smokeless tobacco: Never Used  . Alcohol use Not on file  . Drug use: Unknown  . Sexual activity: Not on file   Other Topics Concern  . Not on file   Social History Narrative  . No narrative on file    Functional Status Survey:    Family History  Problem Relation Age of Onset  . Heart disease Father   . Lung cancer Father     Health Maintenance  Topic Date Due  . MAMMOGRAM  04/27/2017 (Originally 01/12/2016)  . COLONOSCOPY  04/27/2017 (Originally 10/30/2001)  . TETANUS/TDAP  04/27/2017 (Originally 07/20/1965)  .  Hepatitis C Screening  04/27/2017 (Originally Mar 27, 1945)  . PNA vac Low Risk Adult (1 of 2 - PCV13) 04/27/2017 (Originally 07/21/2011)  . FOOT EXAM  04/29/2017 (Originally 07/20/1955)  . OPHTHALMOLOGY EXAM  04/29/2017 (Originally 07/20/1955)  . INFLUENZA VACCINE  08/04/2016  . HEMOGLOBIN A1C  10/21/2016  . DEXA SCAN  Completed    No Known Allergies  Outpatient Encounter Prescriptions as of 04/29/2016  Medication Sig  . albuterol (PROVENTIL HFA;VENTOLIN HFA) 108 (90 Base) MCG/ACT inhaler Inhale 2 puffs into the lungs every 4 (four) hours as needed for wheezing or shortness of breath.  Marland Kitchen albuterol (PROVENTIL) (2.5 MG/3ML) 0.083% nebulizer solution Take 3 mLs (2.5 mg total) by  nebulization every 2 (two) hours as needed for wheezing or shortness of breath.  Marland Kitchen amLODipine (NORVASC) 5 MG tablet Take 5 mg by mouth daily.  Marland Kitchen amLODipine-valsartan (EXFORGE) 5-160 MG tablet Take 1 tablet by mouth daily.  Marland Kitchen arformoterol (BROVANA) 15 MCG/2ML NEBU Take 2 mLs (15 mcg total) by nebulization 2 (two) times daily.  Marland Kitchen aspirin EC 81 MG tablet Take 81 mg by mouth daily.  Marland Kitchen atorvastatin (LIPITOR) 20 MG tablet Take 20 mg by mouth every evening.  . budesonide (PULMICORT) 0.5 MG/2ML nebulizer solution Take 2 mLs (0.5 mg total) by nebulization 2 (two) times daily.  . diazepam (VALIUM) 5 MG tablet Take 0.5 tablets (2.5 mg total) by mouth every 12 (twelve) hours.  . diphenhydrAMINE (BENADRYL) 25 MG tablet Take 50 mg by mouth at bedtime.   Marland Kitchen guaiFENesin (MUCINEX) 600 MG 12 hr tablet Take 2 tablets (1,200 mg total) by mouth 2 (two) times daily.  Marland Kitchen ibuprofen (ADVIL,MOTRIN) 200 MG tablet Take 400 mg by mouth every 6 (six) hours as needed for headache (pain).  . insulin aspart (NOVOLOG) 100 UNIT/ML injection 0-9 Units, Subcutaneous, 3 times daily with meals CBG < 70: implement hypoglycemia protocol CBG 70 - 120: 0 units CBG 121 - 150: 1 unit CBG 151 - 200: 2 units CBG 201 - 250: 3 units CBG 251 - 300: 5 units CBG 301 - 350: 7 units CBG 351 - 400: 9 units CBG > 400: call MD  . insulin aspart (NOVOLOG) 100 UNIT/ML injection Inject 4 Units into the skin 3 (three) times daily with meals.  . insulin glargine (LANTUS) 100 UNIT/ML injection Inject 20 Units into the skin at bedtime.  Marland Kitchen ipratropium-albuterol (DUONEB) 0.5-2.5 (3) MG/3ML SOLN Take 3 mLs by nebulization 3 (three) times daily.  . Multiple Vitamin (MULTIVITAMIN WITH MINERALS) TABS tablet Take 1 tablet by mouth daily.  Marland Kitchen omeprazole (PRILOSEC) 20 MG capsule Take 1 tablet by mouth daily.  . OXYGEN Inhale 3 L into the lungs continuous.   Marland Kitchen PARoxetine (PAXIL) 30 MG tablet Take 30 mg by mouth daily.   . polyethylene glycol (MIRALAX) packet Take  17 g by mouth daily as needed.  . predniSONE (DELTASONE) 10 MG tablet Take 4 tablets (40 mg) daily for 2 days, then, Take 3 tablets (30 mg) daily for 2 days, then, Take 2 tablets (20 mg) daily for 2 days, then, Take 1 tablets (10 mg) daily for 1 days, then stop  . sitaGLIPtan-metformin (JANUMET) 50-1000 MG per tablet Take 1 tablet by mouth 2 (two) times daily with a meal.     No facility-administered encounter medications on file as of 04/29/2016.     Review of Systems Review of Systems  Constitutional: Negative for activity change, appetite change, chills, diaphoresis, fatigue and fever.  HENT: Negative for mouth sores, postnasal  drip, rhinorrhea, sinus pain and sore throat.   Respiratory: Negative for apnea, cough, chest tightness, shortness of breath and wheezing.   Cardiovascular: Negative for chest pain, palpitations and leg swelling.  Gastrointestinal: Negative for abdominal distention, abdominal pain, constipation, diarrhea, nausea and vomiting.  Genitourinary: Negative for dysuria and frequency.  Musculoskeletal: Negative for arthralgias, joint swelling and myalgias.  Skin: Negative for rash.  Neurological: Negative for dizziness, syncope, weakness, light-headedness and numbness.  Psychiatric/Behavioral: Negative for behavioral problems, confusion and sleep disturbance.     Vitals:   04/29/16 1054  BP: (!) 150/64  Pulse: 90  Resp: 19  Temp: 97.7 F (36.5 C)  TempSrc: Axillary  SpO2: 94%  Weight: 197 lb 6.4 oz (89.5 kg)  Height: 5\' 5"  (1.651 m)   Body mass index is 32.85 kg/m. Physical Exam  Constitutional: She is oriented to person, place, and time. She appears well-developed and well-nourished.  HENT:  Head: Normocephalic.  Mouth/Throat: Oropharynx is clear and moist.  Eyes: Pupils are equal, round, and reactive to light.  Neck: Neck supple.  Cardiovascular: Normal rate, regular rhythm and normal heart sounds.   No murmur heard. Pulmonary/Chest: Effort normal.    Decreased breadth sounds b/l but no wheezing   Abdominal: Soft. Bowel sounds are normal. She exhibits no distension. There is no tenderness. There is no rebound.  Musculoskeletal:  Moderate edema in both extremities.  Neurological: She is alert and oriented to person, place, and time.  No Focal deficit  Skin: Skin is warm and dry. No rash noted. No erythema.  Psychiatric: She has a normal mood and affect. Her behavior is normal. Thought content normal. She expresses impulsivity.    Labs reviewed: Basic Metabolic Panel:  Recent Labs  04/20/16 0229 04/21/16 0253 04/22/16 0228  NA 135 134* 135  K 3.4* 4.0 3.7  CL 90* 88* 86*  CO2 34* 33* 38*  GLUCOSE 213* 209* 276*  BUN 14 15 16   CREATININE 0.61 0.56 0.53  CALCIUM 7.8* 8.3* 8.6*   Liver Function Tests:  Recent Labs  04/07/16 1531 04/18/16 1632  AST 22 30  ALT 16 34  ALKPHOS 65 126  BILITOT 0.8 0.8  PROT 6.8 6.5  ALBUMIN 3.6 2.5*   No results for input(s): LIPASE, AMYLASE in the last 8760 hours. No results for input(s): AMMONIA in the last 8760 hours. CBC:  Recent Labs  04/18/16 1632  04/20/16 0229 04/21/16 0253 04/23/16 0444  WBC 21.3*  < > 16.7* 12.8* 11.3*  NEUTROABS 18.8*  --   --   --   --   HGB 11.0*  < > 10.2* 10.0* 10.7*  HCT 34.6*  < > 32.4* 32.1* 34.1*  MCV 77.9*  < > 78.8 78.7 78.4  PLT 320  < > 350 334 295  < > = values in this interval not displayed. Cardiac Enzymes: No results for input(s): CKTOTAL, CKMB, CKMBINDEX, TROPONINI in the last 8760 hours. BNP: Invalid input(s): POCBNP Lab Results  Component Value Date   HGBA1C 6.0 (H) 04/21/2016    Lab Results  Component Value Date   VITAMINB12 550 11/12/2007   No results found for: FOLATE No results found for: IRON, TIBC, FERRITIN  Imaging and Procedures obtained prior to SNF admission: Dg Chest Port 1 View  Result Date: 04/18/2016 CLINICAL DATA:  Cough.  Dyspnea. EXAM: PORTABLE CHEST 1 VIEW COMPARISON:  04/07/2016 chest radiograph.  FINDINGS: Stable cardiomediastinal silhouette with top-normal heart size and aortic atherosclerosis. No pneumothorax. No pleural effusion. Patchy consolidation in  the peripheral mid to lower right lung and at the left lung base, which appears new. IMPRESSION: New patchy consolidation in the peripheral mid to lower right lung and at the left lung base, suspicious for multilobar pneumonia. Recommend follow-up PA and lateral post treatment chest radiographs in 4-6 weeks. Aortic atherosclerosis. Electronically Signed   By: Ilona Sorrel M.D.   On: 04/18/2016 16:52    Assessment/Plan  Sepsis due to pneumonia  Patient is afebrile. Her white count has trended back to normal. She contiues to have cough but has finished her antibiotics course. Plan for repeat Chest Xray in 4 weeks to see resolution of Pneumonia  COPD exacerbation  On prednisone taper.Is hypercapnic. On Nebs Oxygen dependent. Continues to smoke. She needs to follow with Dr Melvyn Novas. Has not seen him in 4 years.  Diabetes mellitus, type II,  It seems she was not on insulin at home just oral hypoglycemics A1C was 6.1 in hospital Will continue Lantus and Insulin bolus with meals. BS have few spikes more then 200 otherwise doing well.  Insulin need to reassess after prednisone taper is finished.  LE edema with cough Her BNP was high in hospital . I am little worried that patient also has right sided CHF . Will do daily weights. Check BNP and repeat Chest Xray to rule out Pulmonary congestion.  Dispostion D/W nurses and patient  She wants to go home and not participating in therapy. She says she can walk with walker and that is good enough. Is non Compliant and most likely Therapy will cut her off. I think her prognosis stays poor if she continues to smoke and stays Non compliant. Family is aware that she needs therapy and Long term plan before she goes home.      Family/ staff Communication:   Labs/tests ordered: Total time  spent in this patient care encounter was 45_ minutes; greater than 50% of the visit spent counseling patient and coordinating care for problems addressed at this encounter.

## 2016-05-04 ENCOUNTER — Non-Acute Institutional Stay (SKILLED_NURSING_FACILITY): Payer: Medicare HMO | Admitting: Adult Health

## 2016-05-04 ENCOUNTER — Encounter: Payer: Self-pay | Admitting: Adult Health

## 2016-05-04 DIAGNOSIS — I10 Essential (primary) hypertension: Secondary | ICD-10-CM | POA: Diagnosis not present

## 2016-05-04 DIAGNOSIS — E119 Type 2 diabetes mellitus without complications: Secondary | ICD-10-CM

## 2016-05-04 DIAGNOSIS — E1169 Type 2 diabetes mellitus with other specified complication: Secondary | ICD-10-CM | POA: Diagnosis not present

## 2016-05-04 DIAGNOSIS — J441 Chronic obstructive pulmonary disease with (acute) exacerbation: Secondary | ICD-10-CM

## 2016-05-04 DIAGNOSIS — Z9981 Dependence on supplemental oxygen: Secondary | ICD-10-CM

## 2016-05-04 DIAGNOSIS — E785 Hyperlipidemia, unspecified: Secondary | ICD-10-CM | POA: Diagnosis not present

## 2016-05-04 DIAGNOSIS — Z794 Long term (current) use of insulin: Secondary | ICD-10-CM

## 2016-05-04 NOTE — Progress Notes (Signed)
Location:   [facility] Room Number: 124 P YKDXI of Service:  SNF (31)    CODE STATUS: Full Code  No Known Allergies  Chief Complaint  Patient presents with  . Discharge Note    Discharge to Home    HPI:  She is being discharged to home with home health for pt/ot/rn/cna/sw. She will need a standard wheelchair; 3:1 commode; neb machine. She will need her prescriptions written and will need to follow up with her medical provider. She is on long term 02 and was on this prior to her hospitalization.    Past Medical History:  Diagnosis Date  . COPD (chronic obstructive pulmonary disease) ([place])   . Diabetes mellitus   . Diastolic dysfunction    history  . Hyperlipidemia   . Hypertension     Past Surgical History:  Procedure Laterality Date  . TUBAL LIGATION      Social History   Social History  . Marital status: Widowed    Spouse name: N/A  . Number of children: N/A  . Years of education: N/A   Occupational History  .      Worked in home   Social History Main Topics  . Smoking status: Current Every Day Smoker    Packs/day: 0.50  . Smokeless tobacco: Never Used  . Alcohol use Not on file  . Drug use: Unknown  . Sexual activity: Not on file   Other Topics Concern  . Not on file   Social History Narrative  . No narrative on file   Family History  Problem Relation Age of Onset  . Heart disease Father   . Lung cancer Father     VITAL SIGNS BP 122/60   Pulse 85   Temp 97.4 F (36.3 C)   Resp 16   Ht 5\' 5"  (1.651 m)   Wt 197 lb 6 oz (89.5 kg)   SpO2 96%   BMI 32.84 kg/m   Patient's Medications  New Prescriptions   No medications on file  Previous Medications   ALBUTEROL (PROVENTIL HFA;VENTOLIN HFA) 108 (90 BASE) MCG/ACT INHALER    Inhale 2 puffs into the lungs every 4 (four) hours as needed for wheezing or shortness of breath.   ALBUTEROL (PROVENTIL) (2.5 MG/3ML) 0.083% NEBULIZER SOLUTION    Take 3 mLs (2.5 mg total) by nebulization  every 2 (two) hours as needed for wheezing or shortness of breath.   AMLODIPINE (NORVASC) 5 MG TABLET    Take 5 mg by mouth daily.   AMLODIPINE-VALSARTAN (EXFORGE) 5-160 MG TABLET    Take 1 tablet by mouth daily.   ARFORMOTEROL (BROVANA) 15 MCG/2ML NEBU    Take 2 mLs (15 mcg total) by nebulization 2 (two) times daily.   ASPIRIN EC 81 MG TABLET    Take 81 mg by mouth daily.   ATORVASTATIN (LIPITOR) 20 MG TABLET    Take 20 mg by mouth every evening.   BUDESONIDE (PULMICORT) 0.5 MG/2ML NEBULIZER SOLUTION    Take 2 mLs (0.5 mg total) by nebulization 2 (two) times daily.   DIAZEPAM (VALIUM) 5 MG TABLET    Take 0.5 tablets (2.5 mg total) by mouth every 12 (twelve) hours.   DIPHENHYDRAMINE (BENADRYL) 25 MG TABLET    Take 50 mg by mouth at bedtime.    GUAIFENESIN (MUCINEX) 600 MG 12 HR TABLET    Take 2 tablets (1,200 mg total) by mouth 2 (two) times daily.   IBUPROFEN (ADVIL,MOTRIN) 200 MG TABLET  Take 400 mg by mouth every 6 (six) hours as needed for headache (pain).   INSULIN ASPART (NOVOLOG) 100 UNIT/ML INJECTION    0-9 Units, Subcutaneous, 3 times daily with meals CBG < 70: implement hypoglycemia protocol CBG 70 - 120: 0 units CBG 121 - 150: 1 unit CBG 151 - 200: 2 units CBG 201 - 250: 3 units CBG 251 - 300: 5 units CBG 301 - 350: 7 units CBG 351 - 400: 9 units CBG > 400: call MD   INSULIN ASPART (NOVOLOG) 100 UNIT/ML INJECTION    Inject 4 Units into the skin 3 (three) times daily with meals.   INSULIN GLARGINE (LANTUS) 100 UNIT/ML INJECTION    Inject 20 Units into the skin at bedtime.   IPRATROPIUM-ALBUTEROL (DUONEB) 0.5-2.5 (3) MG/3ML SOLN    Take 3 mLs by nebulization 3 (three) times daily.   MULTIPLE VITAMIN (MULTIVITAMIN WITH MINERALS) TABS TABLET    Take 1 tablet by mouth daily.   OMEPRAZOLE (PRILOSEC) 20 MG CAPSULE    Take 1 tablet by mouth daily.   OXYGEN    Inhale 3 L into the lungs continuous.    PAROXETINE (PAXIL) 30 MG TABLET    Take 30 mg by mouth daily.    POLYETHYLENE GLYCOL  (MIRALAX) PACKET    Take 17 g by mouth daily as needed.   POTASSIUM CHLORIDE SA (K-DUR,KLOR-CON) 20 MEQ TABLET    Take 20 mEq by mouth daily.   SITAGLIPTAN-METFORMIN (JANUMET) 50-1000 MG PER TABLET    Take 1 tablet by mouth 2 (two) times daily with a meal.     TORSEMIDE (DEMADEX) 10 MG TABLET    Take 10 mg by mouth daily.  Modified Medications   No medications on file  Discontinued Medications   PREDNISONE (DELTASONE) 10 MG TABLET    Take 4 tablets (40 mg) daily for 2 days, then, Take 3 tablets (30 mg) daily for 2 days, then, Take 2 tablets (20 mg) daily for 2 days, then, Take 1 tablets (10 mg) daily for 1 days, then stop     SIGNIFICANT DIAGNOSTIC EXAMS  04-07-16: ct angio of chest: 1. No evidence of pulmonary embolism. 2. Severe generalized bronchial wall thickening with narrowing of the right lower lobe bronchus and the left upper lobe bronchus. 3. COPD/emphysema. Patchy ground-glass opacities in the right middle lobe are likely inflammatory. No acute cardiopulmonary disease otherwise. 4. Query hepatic cirrhosis, as there is relative enlargement of the left lobe of the liver compared to the right lobe. 5. Generalized atherosclerosis, including three-vessel coronary atherosclerosis. Possible hemodynamically significant stenosis at the origin of the left subclavian artery.  04-18-16: chest x-ray: New patchy consolidation in the peripheral mid to lower right lung and at the left lung base, suspicious for multilobar pneumonia. Recommend follow-up PA and lateral post treatment chest radiographs in 4-6 weeks.  04-21-16: chest x-ray:Persistent subtle parenchymal densities in the lower lungs, right side greater than left. Infectious etiology cannot be excluded. Minimal change from the previous examination.   04-23-16: chest x-ray: COPD.  Continued improvement in bibasilar airspace disease.  05-03-16: chest x-ray: right pleural effusion. Passive right lower lobe atelectasis or associate  pneumonia is not excluded.     LABS REVIEWED:   04-18-16: wbc 21.3; hgb 11.0; hct 34.6; mcv 77.9; plt 320; glucose 147; bun 21; creat 0.93; k+ 3.1; na++ 129; liver normal albumin 2.5; blood cultures: no growth 04-20-16: wbc 16.7; hgb 10.2; hct 32.4; mcv 78.8; plt 350; glucose 213; bun 14; creat 0.61;  k+ 3.4; na++ 135 04-21-16: wbc 12.8; hgb 10.0; hct 32.1; mcv 78.7 ;plt 334; glucose 109; bun 15; creat 0.56; k+ 4.0; na++ 134; hgb a1c 6.0 04-23-16: wbc 11.3; hgb 10.7; hct 34.1; mcv 78.4; plt 295      Review of Systems  Constitutional: Negative for malaise/fatigue.  Respiratory: Negative for cough and shortness of breath.   Cardiovascular: Negative for chest pain, palpitations and leg swelling.  Gastrointestinal: Negative for abdominal pain, constipation and heartburn.  Musculoskeletal: Negative for back pain, joint pain and myalgias.  Skin: Negative.   Neurological: Negative for dizziness.  Psychiatric/Behavioral: The patient is not nervous/anxious.     Physical Exam  Constitutional: She is oriented to person, place, and time. No distress.  Eyes: Conjunctivae are normal.  Neck: Neck supple. No JVD present. No thyromegaly present.  Cardiovascular: Normal rate, regular rhythm and intact distal pulses.   Respiratory: Effort normal. No respiratory distress. She has no wheezes.  Breath sounds diminished Is 02 dependent   GI: Soft. Bowel sounds are normal. She exhibits no distension. There is no tenderness.  Musculoskeletal: She exhibits no edema.  Able to move all extremities   Lymphadenopathy:    She has no cervical adenopathy.  Neurological: She is alert and oriented to person, place, and time.  Skin: Skin is warm and dry. She is not diaphoretic.  Psychiatric: She has a normal mood and affect.     ASSESSMENT/ PLAN:  Patient is being discharged with the following home health services:  Pt/ot/rn/cna/sw: to evaluate and treat as indicated for gait balance strength adl training  medication management adl care community outreach.   Patient is being discharged with the following durable medical equipment:  3:1 commode; nebulizer machine. Stand wheelchair 20 x 18inch with elevated leg rests cushion; anti-tippers; brake extensions to allow patient to maintain current level of independence with her adls which cannot be achieved with a walker. Can self propel   Patient has been advised to f/u with their PCP in 1-2 weeks to bring them up to date on their rehab stay.  Social services at facility was responsible for arranging this appointment.  Pt was provided with a 30 day supply of prescriptions for medications and refills must be obtained from their PCP.  For controlled substances, a more limited supply may be provided adequate until PCP appointment only. #5 valium 5 mg tabs.    Time spent with patient   45  minutes >50% time spent counseling; reviewing medical record; tests; labs; and developing future plan of care   Ok Edwards NP [facility]  Contact [phone] Monday through Friday 8am- 5pm  After hours call [phone]

## 2016-05-14 DIAGNOSIS — F17210 Nicotine dependence, cigarettes, uncomplicated: Secondary | ICD-10-CM | POA: Diagnosis not present

## 2016-05-14 DIAGNOSIS — Z7984 Long term (current) use of oral hypoglycemic drugs: Secondary | ICD-10-CM | POA: Diagnosis not present

## 2016-05-14 DIAGNOSIS — I5189 Other ill-defined heart diseases: Secondary | ICD-10-CM | POA: Diagnosis not present

## 2016-05-14 DIAGNOSIS — R413 Other amnesia: Secondary | ICD-10-CM | POA: Diagnosis not present

## 2016-05-14 DIAGNOSIS — E785 Hyperlipidemia, unspecified: Secondary | ICD-10-CM | POA: Diagnosis not present

## 2016-05-14 DIAGNOSIS — Z9981 Dependence on supplemental oxygen: Secondary | ICD-10-CM | POA: Diagnosis not present

## 2016-05-14 DIAGNOSIS — F1010 Alcohol abuse, uncomplicated: Secondary | ICD-10-CM | POA: Diagnosis not present

## 2016-05-14 DIAGNOSIS — J441 Chronic obstructive pulmonary disease with (acute) exacerbation: Secondary | ICD-10-CM | POA: Diagnosis not present

## 2016-05-14 DIAGNOSIS — J9610 Chronic respiratory failure, unspecified whether with hypoxia or hypercapnia: Secondary | ICD-10-CM | POA: Diagnosis not present

## 2016-05-14 DIAGNOSIS — K7460 Unspecified cirrhosis of liver: Secondary | ICD-10-CM | POA: Diagnosis not present

## 2016-05-14 DIAGNOSIS — J189 Pneumonia, unspecified organism: Secondary | ICD-10-CM | POA: Diagnosis not present

## 2016-05-14 DIAGNOSIS — R69 Illness, unspecified: Secondary | ICD-10-CM | POA: Diagnosis not present

## 2016-05-14 DIAGNOSIS — I10 Essential (primary) hypertension: Secondary | ICD-10-CM | POA: Diagnosis not present

## 2016-05-14 DIAGNOSIS — E1165 Type 2 diabetes mellitus with hyperglycemia: Secondary | ICD-10-CM | POA: Diagnosis not present

## 2016-05-21 DIAGNOSIS — J441 Chronic obstructive pulmonary disease with (acute) exacerbation: Secondary | ICD-10-CM | POA: Diagnosis not present

## 2016-05-21 DIAGNOSIS — R69 Illness, unspecified: Secondary | ICD-10-CM | POA: Diagnosis not present

## 2016-06-01 DIAGNOSIS — J449 Chronic obstructive pulmonary disease, unspecified: Secondary | ICD-10-CM | POA: Diagnosis not present

## 2016-06-14 DIAGNOSIS — J9610 Chronic respiratory failure, unspecified whether with hypoxia or hypercapnia: Secondary | ICD-10-CM | POA: Diagnosis not present

## 2016-06-14 DIAGNOSIS — J441 Chronic obstructive pulmonary disease with (acute) exacerbation: Secondary | ICD-10-CM | POA: Diagnosis not present

## 2016-06-14 DIAGNOSIS — J189 Pneumonia, unspecified organism: Secondary | ICD-10-CM | POA: Diagnosis not present

## 2016-06-19 DIAGNOSIS — Z760 Encounter for issue of repeat prescription: Secondary | ICD-10-CM | POA: Diagnosis not present

## 2016-06-19 DIAGNOSIS — Z9981 Dependence on supplemental oxygen: Secondary | ICD-10-CM | POA: Diagnosis not present

## 2016-06-19 DIAGNOSIS — J449 Chronic obstructive pulmonary disease, unspecified: Secondary | ICD-10-CM | POA: Diagnosis not present

## 2016-06-21 DIAGNOSIS — J441 Chronic obstructive pulmonary disease with (acute) exacerbation: Secondary | ICD-10-CM | POA: Diagnosis not present

## 2016-07-02 DIAGNOSIS — J449 Chronic obstructive pulmonary disease, unspecified: Secondary | ICD-10-CM | POA: Diagnosis not present

## 2016-07-14 DIAGNOSIS — J189 Pneumonia, unspecified organism: Secondary | ICD-10-CM | POA: Diagnosis not present

## 2016-07-14 DIAGNOSIS — J9610 Chronic respiratory failure, unspecified whether with hypoxia or hypercapnia: Secondary | ICD-10-CM | POA: Diagnosis not present

## 2016-07-14 DIAGNOSIS — J441 Chronic obstructive pulmonary disease with (acute) exacerbation: Secondary | ICD-10-CM | POA: Diagnosis not present

## 2016-07-21 DIAGNOSIS — J441 Chronic obstructive pulmonary disease with (acute) exacerbation: Secondary | ICD-10-CM | POA: Diagnosis not present

## 2016-08-01 DIAGNOSIS — J449 Chronic obstructive pulmonary disease, unspecified: Secondary | ICD-10-CM | POA: Diagnosis not present

## 2016-08-14 DIAGNOSIS — J189 Pneumonia, unspecified organism: Secondary | ICD-10-CM | POA: Diagnosis not present

## 2016-08-14 DIAGNOSIS — J441 Chronic obstructive pulmonary disease with (acute) exacerbation: Secondary | ICD-10-CM | POA: Diagnosis not present

## 2016-08-14 DIAGNOSIS — J9610 Chronic respiratory failure, unspecified whether with hypoxia or hypercapnia: Secondary | ICD-10-CM | POA: Diagnosis not present

## 2016-08-21 DIAGNOSIS — J441 Chronic obstructive pulmonary disease with (acute) exacerbation: Secondary | ICD-10-CM | POA: Diagnosis not present

## 2016-08-25 DIAGNOSIS — E7800 Pure hypercholesterolemia, unspecified: Secondary | ICD-10-CM | POA: Diagnosis not present

## 2016-08-25 DIAGNOSIS — Z720 Tobacco use: Secondary | ICD-10-CM | POA: Diagnosis not present

## 2016-08-25 DIAGNOSIS — Z79899 Other long term (current) drug therapy: Secondary | ICD-10-CM | POA: Diagnosis not present

## 2016-08-25 DIAGNOSIS — J439 Emphysema, unspecified: Secondary | ICD-10-CM | POA: Diagnosis not present

## 2016-08-25 DIAGNOSIS — R69 Illness, unspecified: Secondary | ICD-10-CM | POA: Diagnosis not present

## 2016-08-25 DIAGNOSIS — E538 Deficiency of other specified B group vitamins: Secondary | ICD-10-CM | POA: Diagnosis not present

## 2016-08-25 DIAGNOSIS — I10 Essential (primary) hypertension: Secondary | ICD-10-CM | POA: Diagnosis not present

## 2016-08-25 DIAGNOSIS — E1142 Type 2 diabetes mellitus with diabetic polyneuropathy: Secondary | ICD-10-CM | POA: Diagnosis not present

## 2016-08-25 DIAGNOSIS — Z23 Encounter for immunization: Secondary | ICD-10-CM | POA: Diagnosis not present

## 2016-08-30 DIAGNOSIS — E1142 Type 2 diabetes mellitus with diabetic polyneuropathy: Secondary | ICD-10-CM | POA: Diagnosis not present

## 2016-09-01 DIAGNOSIS — J449 Chronic obstructive pulmonary disease, unspecified: Secondary | ICD-10-CM | POA: Diagnosis not present

## 2016-09-10 ENCOUNTER — Other Ambulatory Visit: Payer: Self-pay | Admitting: Acute Care

## 2016-09-10 DIAGNOSIS — F17210 Nicotine dependence, cigarettes, uncomplicated: Secondary | ICD-10-CM

## 2016-09-10 DIAGNOSIS — Z122 Encounter for screening for malignant neoplasm of respiratory organs: Secondary | ICD-10-CM

## 2016-09-14 DIAGNOSIS — J9610 Chronic respiratory failure, unspecified whether with hypoxia or hypercapnia: Secondary | ICD-10-CM | POA: Diagnosis not present

## 2016-09-14 DIAGNOSIS — J189 Pneumonia, unspecified organism: Secondary | ICD-10-CM | POA: Diagnosis not present

## 2016-09-14 DIAGNOSIS — J441 Chronic obstructive pulmonary disease with (acute) exacerbation: Secondary | ICD-10-CM | POA: Diagnosis not present

## 2016-09-21 DIAGNOSIS — J441 Chronic obstructive pulmonary disease with (acute) exacerbation: Secondary | ICD-10-CM | POA: Diagnosis not present

## 2016-09-24 ENCOUNTER — Inpatient Hospital Stay: Admission: RE | Admit: 2016-09-24 | Payer: Self-pay | Source: Ambulatory Visit

## 2016-09-24 ENCOUNTER — Telehealth: Payer: Self-pay | Admitting: Acute Care

## 2016-09-24 ENCOUNTER — Encounter: Payer: Self-pay | Admitting: Acute Care

## 2016-09-24 NOTE — Telephone Encounter (Signed)
Will route to lung nodule pool. 

## 2016-09-27 NOTE — Telephone Encounter (Signed)
Spoke with pt's brother and [facility] rescheduled 10/08/16 3:00 CT will be rescheduled.  Nothing further needed

## 2016-10-02 DIAGNOSIS — J449 Chronic obstructive pulmonary disease, unspecified: Secondary | ICD-10-CM | POA: Diagnosis not present

## 2016-10-08 ENCOUNTER — Ambulatory Visit (INDEPENDENT_AMBULATORY_CARE_PROVIDER_SITE_OTHER): Payer: Medicare HMO | Admitting: Acute Care

## 2016-10-08 ENCOUNTER — Ambulatory Visit (INDEPENDENT_AMBULATORY_CARE_PROVIDER_SITE_OTHER)
Admission: RE | Admit: 2016-10-08 | Discharge: 2016-10-08 | Disposition: A | Discharge status: Home / Self Care | Payer: Medicare HMO | Source: Ambulatory Visit | Attending: Acute Care | Admitting: Acute Care

## 2016-10-08 ENCOUNTER — Encounter: Payer: Self-pay | Admitting: Acute Care

## 2016-10-08 DIAGNOSIS — R69 Illness, unspecified: Secondary | ICD-10-CM | POA: Diagnosis not present

## 2016-10-08 DIAGNOSIS — Z87891 Personal history of nicotine dependence: Secondary | ICD-10-CM | POA: Diagnosis not present

## 2016-10-08 DIAGNOSIS — F17210 Nicotine dependence, cigarettes, uncomplicated: Secondary | ICD-10-CM

## 2016-10-08 DIAGNOSIS — Z122 Encounter for screening for malignant neoplasm of respiratory organs: Secondary | ICD-10-CM

## 2016-10-08 DIAGNOSIS — J439 Emphysema, unspecified: Secondary | ICD-10-CM | POA: Diagnosis not present

## 2016-10-08 NOTE — Progress Notes (Signed)
Shared Decision Making Visit Lung Cancer Screening Program [phone])   Eligibility:  Age 71 y.o.  Pack Years Smoking History Calculation 52 pack year smoking history (# packs/per year x # years smoked)  Recent History of coughing up blood  No  Unexplained weight loss? no ( >Than 15 pounds within the last 6 months )  Prior History Lung / other cancer no (Diagnosis within the last 5 years already requiring surveillance chest CT Scans).  Smoking Status Current Smoker  Former Smokers: Years since quit:NA  Quit Date: NA  Visit Components:  Discussion included one or more decision making aids. yes  Discussion included risk/benefits of screening. yes  Discussion included potential follow up diagnostic testing for abnormal scans. yes  Discussion included meaning and risk of over diagnosis. yes  Discussion included meaning and risk of False Positives. yes  Discussion included meaning of total radiation exposure. yes  Counseling Included:  Importance of adherence to annual lung cancer LDCT screening. yes  Impact of comorbidities on ability to participate in the program. yes  Ability and willingness to under diagnostic treatment. yes  Smoking Cessation Counseling:  Current Smokers:   Discussed importance of smoking cessation. yes  Information about tobacco cessation classes and interventions provided to patient. yes  Patient provided with "ticket" for LDCT Scan. yes  Symptomatic Patient. no  Counseling  Diagnosis Code: Tobacco Use Z72.0  Asymptomatic Patient Yes  Counseling (Intermediate counseling: > three minutes counseling) G8366  Former Smokers:   Discussed the importance of maintaining cigarette abstinence. yes  Diagnosis Code: Personal History of Nicotine Dependence. Q94.765  Information about tobacco cessation classes and interventions provided to patient. Yes  Patient provided with "ticket" for LDCT Scan. yes  Written Order for Lung Cancer  Screening with LDCT placed in Epic. Yes (CT Chest Lung Cancer Screening Low Dose W/O CM) [id number] Z12.2-Screening of respiratory organs Z87.891-Personal history of nicotine dependence  I have spent 25 minutes of face to face time with [name] discussing the risks and benefits of lung cancer screening. We viewed a power point together that explained in detail the above noted topics. We paused at intervals to allow for questions to be asked and answered to ensure understanding.We discussed that the single most powerful action that she can take to decrease her risk of developing lung cancer is to quit smoking. We discussed whether or not she is ready to commit to setting a quit date. She is currently not ready to set a quit date. We discussed options for tools to aid in quitting smoking including nicotine replacement therapy, non-nicotine medications, support groups, Quit Smart classes, and behavior modification. We discussed that often times setting smaller, more achievable goals, such as eliminating 1 cigarette a day for a week and then 2 cigarettes a day for a week can be helpful in slowly decreasing the number of cigarettes smoked. This allows for a sense of accomplishment as well as providing a clinical benefit. I gave her the " Be Stronger Than Your Excuses" card with contact information for community resources, classes, free nicotine replacement therapy, and access to mobile apps, text messaging, and on-line smoking cessation help. I have also given her my card and contact information in the event she needs to contact me. We discussed the time and location of the scan, and that either Doroteo Glassman RN or I will call with the results within 24-48 hours of receiving them. I have offered [name]  a copy of the power point we viewed  as a resource in the event they need reinforcement of the concepts we discussed today in the office. The patient verbalized understanding of all of  the above and had no  further questions upon leaving the office. They have my contact information in the event they have any further questions.  I spent 4 minutes counseling on smoking cessation and the health risks of continued tobacco abuse.  I explained to the patient that there has been a high incidence of coronary artery disease noted on these exams. I explained that this is a non-gated exam therefore degree or severity cannot be determined. This patient is not currently on statin therapy. I have asked the patient to follow-up with their PCP regarding any incidental finding of coronary artery disease and management with diet or medication as their PCP  feels is clinically indicated. The patient verbalized understanding of the above and had no further questions upon completion of the visit.      Magdalen Spatz, NP 10/08/2016

## 2016-10-14 DIAGNOSIS — J9610 Chronic respiratory failure, unspecified whether with hypoxia or hypercapnia: Secondary | ICD-10-CM | POA: Diagnosis not present

## 2016-10-14 DIAGNOSIS — J441 Chronic obstructive pulmonary disease with (acute) exacerbation: Secondary | ICD-10-CM | POA: Diagnosis not present

## 2016-10-14 DIAGNOSIS — J189 Pneumonia, unspecified organism: Secondary | ICD-10-CM | POA: Diagnosis not present

## 2016-10-20 ENCOUNTER — Other Ambulatory Visit: Payer: Self-pay | Admitting: Acute Care

## 2016-10-20 DIAGNOSIS — Z122 Encounter for screening for malignant neoplasm of respiratory organs: Secondary | ICD-10-CM

## 2016-10-20 DIAGNOSIS — F17210 Nicotine dependence, cigarettes, uncomplicated: Secondary | ICD-10-CM

## 2016-10-21 DIAGNOSIS — J441 Chronic obstructive pulmonary disease with (acute) exacerbation: Secondary | ICD-10-CM | POA: Diagnosis not present

## 2016-11-01 DIAGNOSIS — J449 Chronic obstructive pulmonary disease, unspecified: Secondary | ICD-10-CM | POA: Diagnosis not present

## 2016-11-14 DIAGNOSIS — J189 Pneumonia, unspecified organism: Secondary | ICD-10-CM | POA: Diagnosis not present

## 2016-11-14 DIAGNOSIS — J441 Chronic obstructive pulmonary disease with (acute) exacerbation: Secondary | ICD-10-CM | POA: Diagnosis not present

## 2016-11-14 DIAGNOSIS — J9610 Chronic respiratory failure, unspecified whether with hypoxia or hypercapnia: Secondary | ICD-10-CM | POA: Diagnosis not present

## 2016-11-18 ENCOUNTER — Other Ambulatory Visit: Payer: Self-pay | Admitting: Adult Health

## 2016-11-19 ENCOUNTER — Other Ambulatory Visit: Payer: Self-pay | Admitting: Adult Health

## 2016-11-21 DIAGNOSIS — J441 Chronic obstructive pulmonary disease with (acute) exacerbation: Secondary | ICD-10-CM | POA: Diagnosis not present

## 2016-12-02 DIAGNOSIS — J449 Chronic obstructive pulmonary disease, unspecified: Secondary | ICD-10-CM | POA: Diagnosis not present

## 2016-12-07 DIAGNOSIS — J439 Emphysema, unspecified: Secondary | ICD-10-CM | POA: Diagnosis not present

## 2016-12-07 DIAGNOSIS — I2510 Atherosclerotic heart disease of native coronary artery without angina pectoris: Secondary | ICD-10-CM | POA: Diagnosis not present

## 2016-12-07 DIAGNOSIS — E1142 Type 2 diabetes mellitus with diabetic polyneuropathy: Secondary | ICD-10-CM | POA: Diagnosis not present

## 2016-12-07 DIAGNOSIS — Z79899 Other long term (current) drug therapy: Secondary | ICD-10-CM | POA: Diagnosis not present

## 2016-12-07 DIAGNOSIS — E538 Deficiency of other specified B group vitamins: Secondary | ICD-10-CM | POA: Diagnosis not present

## 2016-12-07 DIAGNOSIS — K219 Gastro-esophageal reflux disease without esophagitis: Secondary | ICD-10-CM | POA: Diagnosis not present

## 2016-12-07 DIAGNOSIS — R69 Illness, unspecified: Secondary | ICD-10-CM | POA: Diagnosis not present

## 2016-12-07 DIAGNOSIS — E7800 Pure hypercholesterolemia, unspecified: Secondary | ICD-10-CM | POA: Diagnosis not present

## 2016-12-07 DIAGNOSIS — I10 Essential (primary) hypertension: Secondary | ICD-10-CM | POA: Diagnosis not present

## 2016-12-08 ENCOUNTER — Telehealth: Payer: Self-pay

## 2016-12-08 NOTE — Telephone Encounter (Signed)
SENT NOTES TO SCHEDULING 

## 2016-12-14 DIAGNOSIS — J189 Pneumonia, unspecified organism: Secondary | ICD-10-CM | POA: Diagnosis not present

## 2016-12-14 DIAGNOSIS — J441 Chronic obstructive pulmonary disease with (acute) exacerbation: Secondary | ICD-10-CM | POA: Diagnosis not present

## 2016-12-14 DIAGNOSIS — J9610 Chronic respiratory failure, unspecified whether with hypoxia or hypercapnia: Secondary | ICD-10-CM | POA: Diagnosis not present

## 2016-12-21 DIAGNOSIS — Z79899 Other long term (current) drug therapy: Secondary | ICD-10-CM | POA: Diagnosis not present

## 2016-12-21 DIAGNOSIS — J441 Chronic obstructive pulmonary disease with (acute) exacerbation: Secondary | ICD-10-CM | POA: Diagnosis not present

## 2017-01-01 DIAGNOSIS — J449 Chronic obstructive pulmonary disease, unspecified: Secondary | ICD-10-CM | POA: Diagnosis not present

## 2017-01-14 DIAGNOSIS — J189 Pneumonia, unspecified organism: Secondary | ICD-10-CM | POA: Diagnosis not present

## 2017-01-14 DIAGNOSIS — J9610 Chronic respiratory failure, unspecified whether with hypoxia or hypercapnia: Secondary | ICD-10-CM | POA: Diagnosis not present

## 2017-01-14 DIAGNOSIS — J441 Chronic obstructive pulmonary disease with (acute) exacerbation: Secondary | ICD-10-CM | POA: Diagnosis not present

## 2017-01-21 DIAGNOSIS — J441 Chronic obstructive pulmonary disease with (acute) exacerbation: Secondary | ICD-10-CM | POA: Diagnosis not present

## 2017-02-01 DIAGNOSIS — J449 Chronic obstructive pulmonary disease, unspecified: Secondary | ICD-10-CM | POA: Diagnosis not present

## 2017-02-14 DIAGNOSIS — J189 Pneumonia, unspecified organism: Secondary | ICD-10-CM | POA: Diagnosis not present

## 2017-02-14 DIAGNOSIS — J9610 Chronic respiratory failure, unspecified whether with hypoxia or hypercapnia: Secondary | ICD-10-CM | POA: Diagnosis not present

## 2017-02-14 DIAGNOSIS — J441 Chronic obstructive pulmonary disease with (acute) exacerbation: Secondary | ICD-10-CM | POA: Diagnosis not present

## 2017-02-21 DIAGNOSIS — J441 Chronic obstructive pulmonary disease with (acute) exacerbation: Secondary | ICD-10-CM | POA: Diagnosis not present

## 2017-02-23 ENCOUNTER — Other Ambulatory Visit: Payer: Self-pay | Admitting: Adult Health

## 2017-03-03 DIAGNOSIS — J449 Chronic obstructive pulmonary disease, unspecified: Secondary | ICD-10-CM | POA: Diagnosis not present

## 2017-03-14 DIAGNOSIS — J441 Chronic obstructive pulmonary disease with (acute) exacerbation: Secondary | ICD-10-CM | POA: Diagnosis not present

## 2017-03-14 DIAGNOSIS — J189 Pneumonia, unspecified organism: Secondary | ICD-10-CM | POA: Diagnosis not present

## 2017-03-14 DIAGNOSIS — J9610 Chronic respiratory failure, unspecified whether with hypoxia or hypercapnia: Secondary | ICD-10-CM | POA: Diagnosis not present

## 2017-03-21 DIAGNOSIS — J441 Chronic obstructive pulmonary disease with (acute) exacerbation: Secondary | ICD-10-CM | POA: Diagnosis not present

## 2017-04-01 DIAGNOSIS — J449 Chronic obstructive pulmonary disease, unspecified: Secondary | ICD-10-CM | POA: Diagnosis not present

## 2017-04-06 DIAGNOSIS — R69 Illness, unspecified: Secondary | ICD-10-CM | POA: Diagnosis not present

## 2017-04-14 DIAGNOSIS — J441 Chronic obstructive pulmonary disease with (acute) exacerbation: Secondary | ICD-10-CM | POA: Diagnosis not present

## 2017-04-14 DIAGNOSIS — J189 Pneumonia, unspecified organism: Secondary | ICD-10-CM | POA: Diagnosis not present

## 2017-04-14 DIAGNOSIS — J9610 Chronic respiratory failure, unspecified whether with hypoxia or hypercapnia: Secondary | ICD-10-CM | POA: Diagnosis not present

## 2017-04-21 DIAGNOSIS — J441 Chronic obstructive pulmonary disease with (acute) exacerbation: Secondary | ICD-10-CM | POA: Diagnosis not present

## 2017-05-02 DIAGNOSIS — J449 Chronic obstructive pulmonary disease, unspecified: Secondary | ICD-10-CM | POA: Diagnosis not present

## 2017-05-14 DIAGNOSIS — J9610 Chronic respiratory failure, unspecified whether with hypoxia or hypercapnia: Secondary | ICD-10-CM | POA: Diagnosis not present

## 2017-05-14 DIAGNOSIS — J441 Chronic obstructive pulmonary disease with (acute) exacerbation: Secondary | ICD-10-CM | POA: Diagnosis not present

## 2017-05-14 DIAGNOSIS — J189 Pneumonia, unspecified organism: Secondary | ICD-10-CM | POA: Diagnosis not present

## 2017-05-21 DIAGNOSIS — J441 Chronic obstructive pulmonary disease with (acute) exacerbation: Secondary | ICD-10-CM | POA: Diagnosis not present

## 2017-06-01 DIAGNOSIS — J449 Chronic obstructive pulmonary disease, unspecified: Secondary | ICD-10-CM | POA: Diagnosis not present

## 2017-06-17 IMAGING — CR DG CHEST 2V
2 series · 2 of 2 positions shown · non-contrast
Comparison: PA and lateral chest x-ray December 09, 2014

CLINICAL DATA: Shortness of breath and productive cough for the
past week. History of COPD and diabetes. Current smoker.

EXAM:
CHEST  2 VIEW

[chest pa]
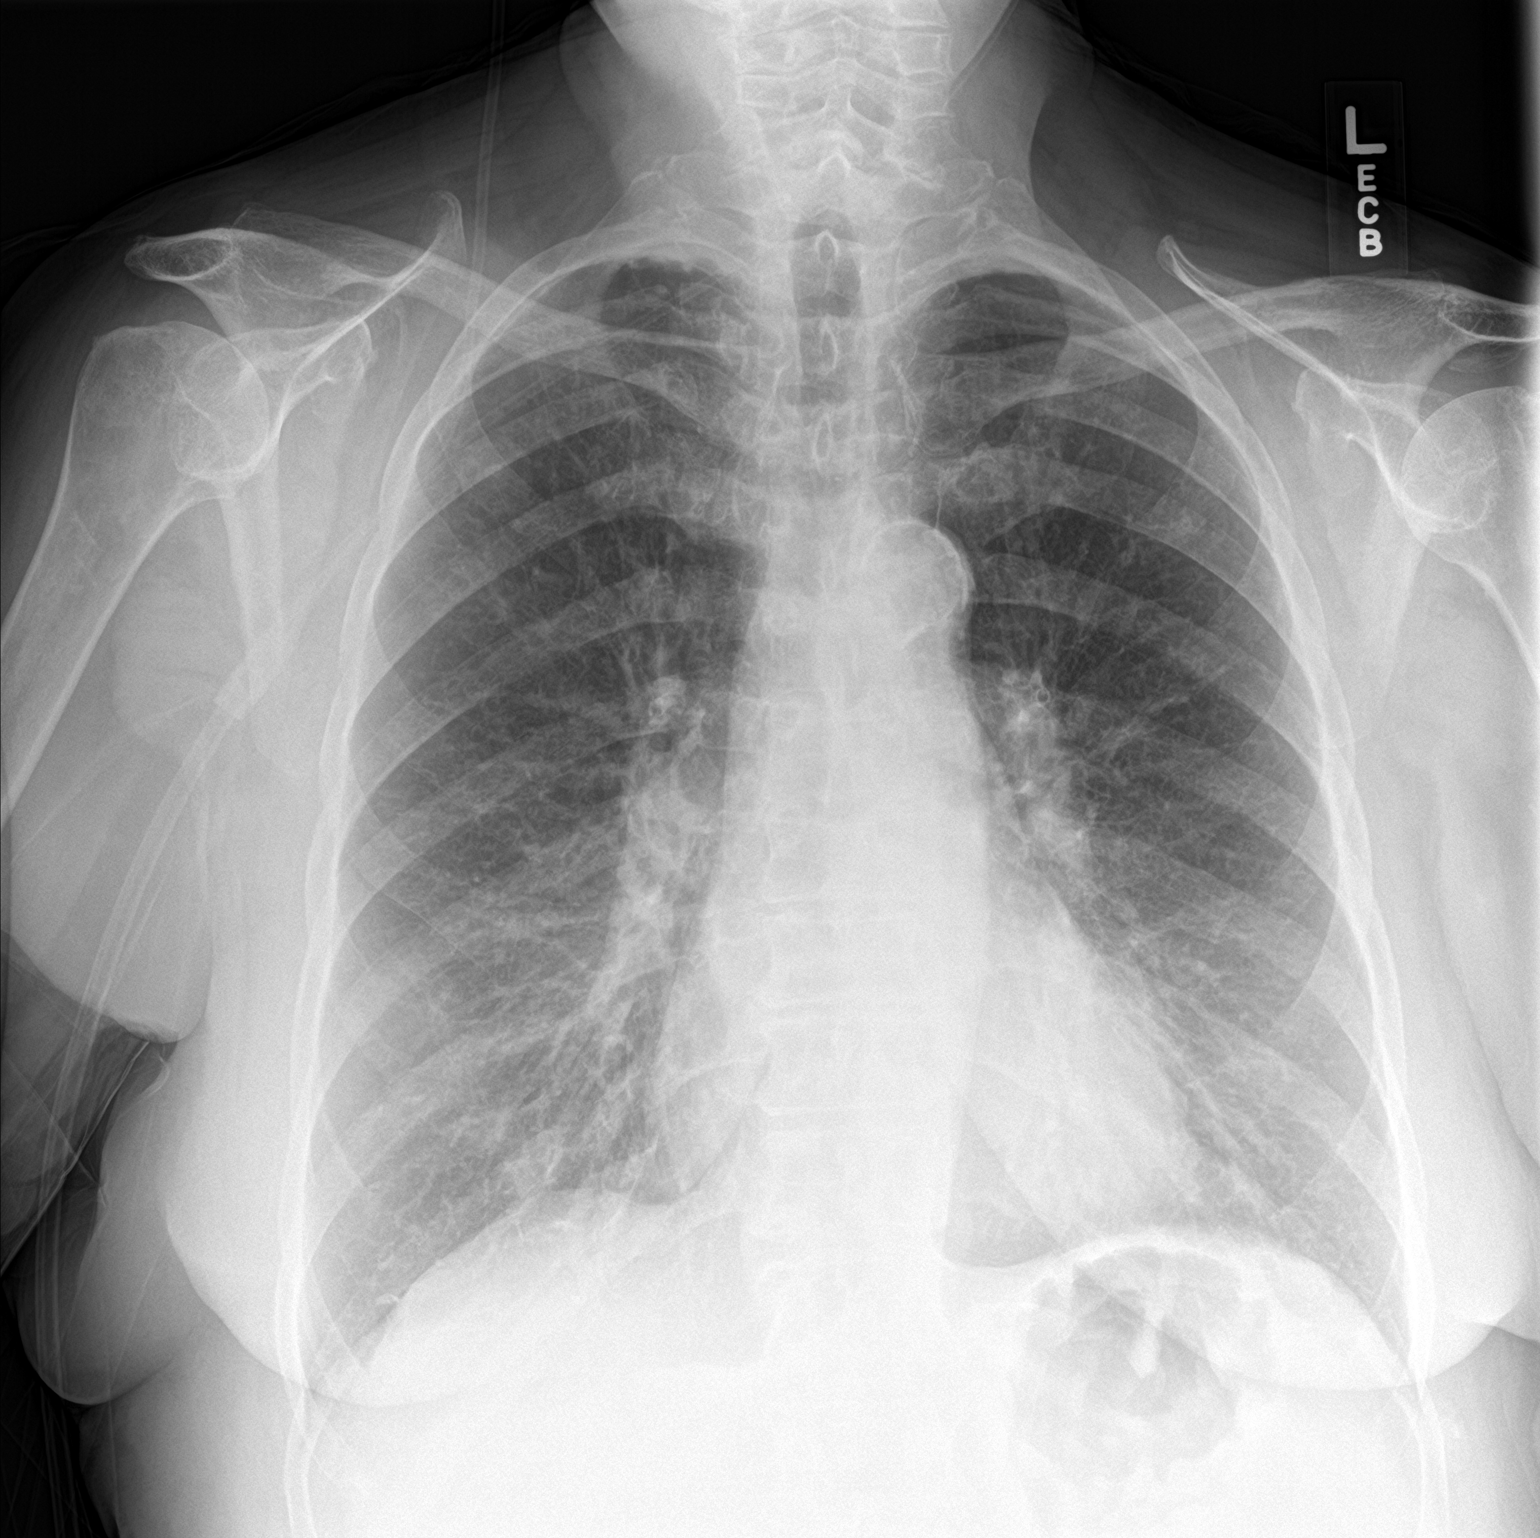

[chest lat]
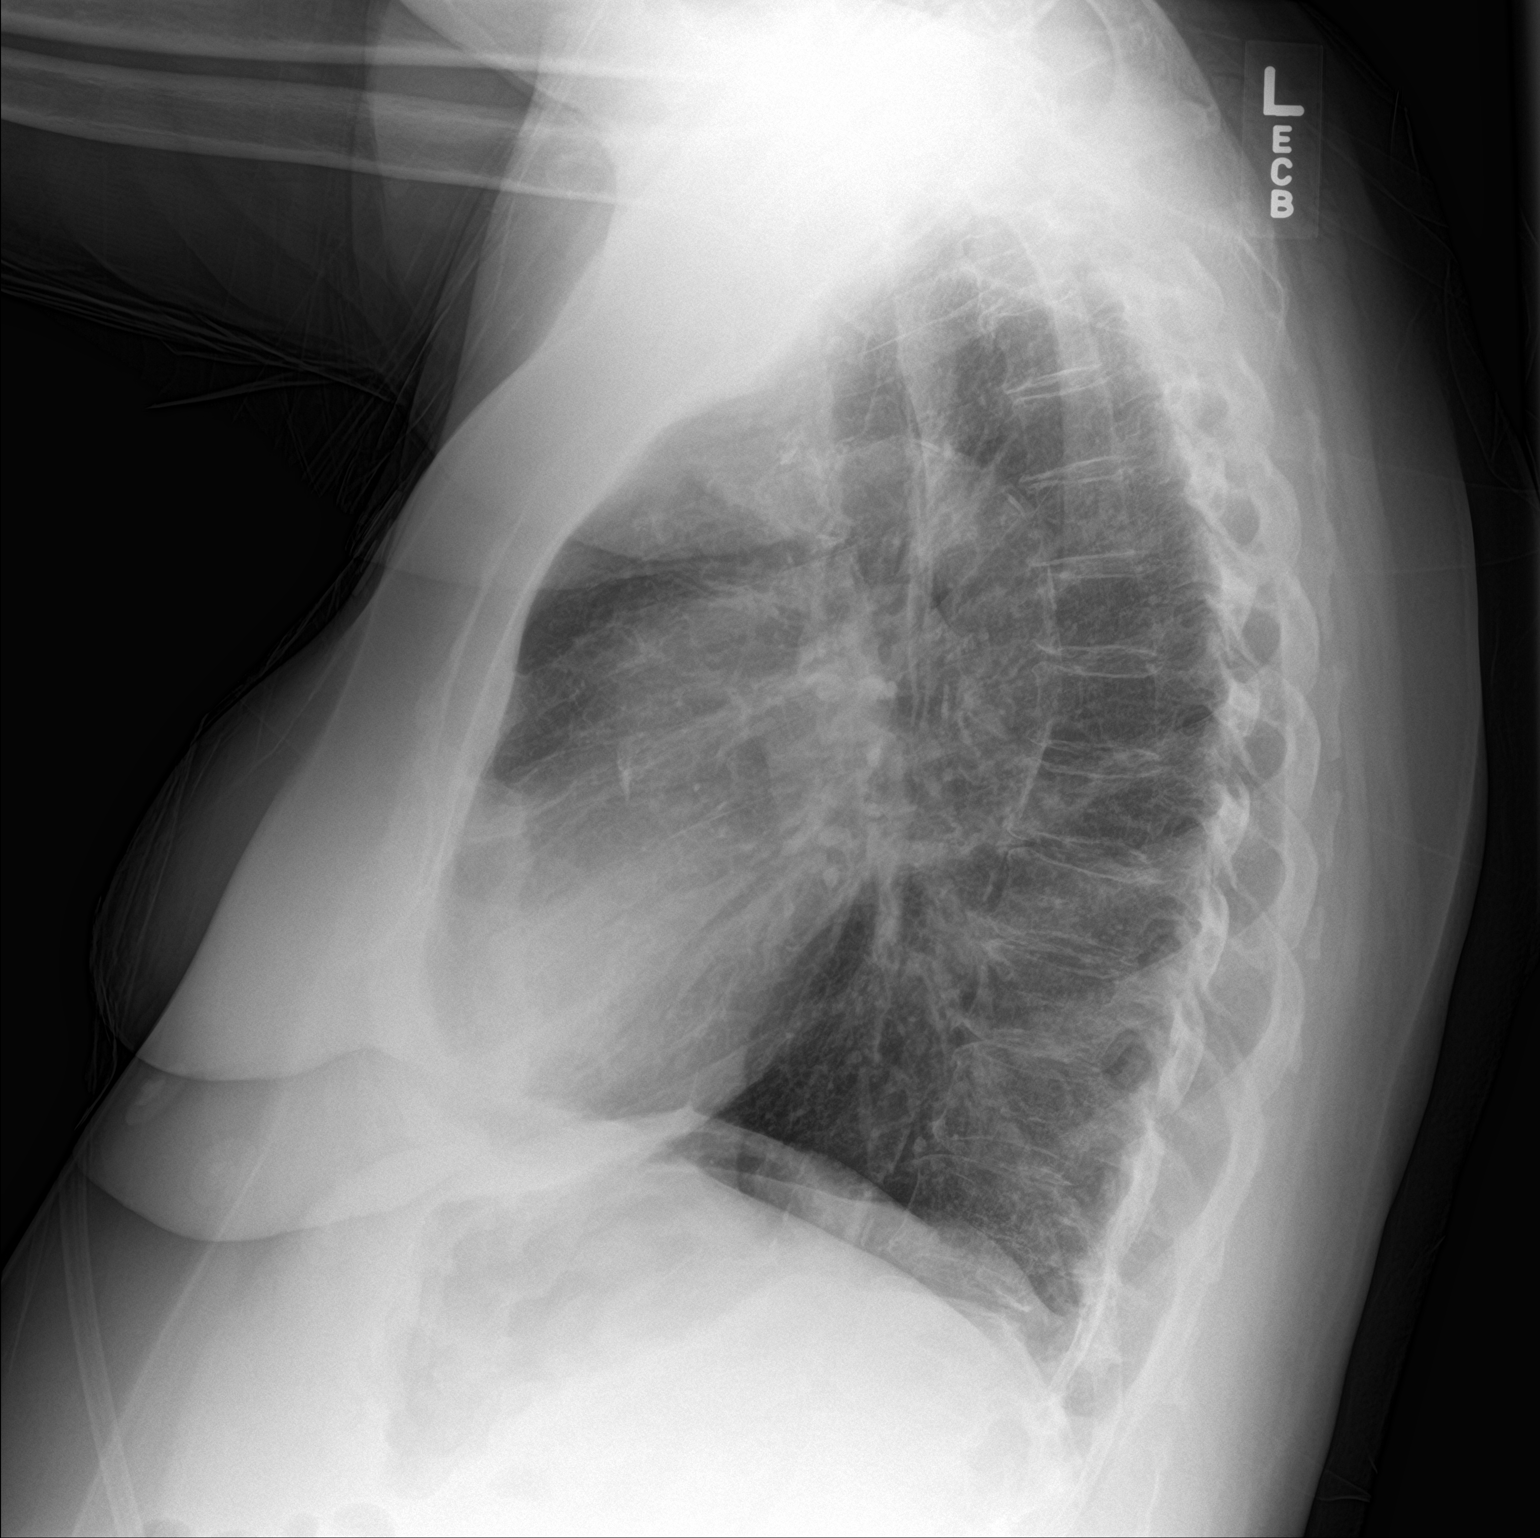

[2 of 2 positions shown; findings below may reference images not displayed]

FINDINGS: The lungs are adequately inflated. The interstitial markings are
coarse though stable. There is no alveolar infiltrate or pleural
effusion. The heart and pulmonary vascularity are normal. The
mediastinum is normal in width. There is calcification in the wall
of the thoracic aorta. The bony thorax exhibits no acute
abnormality.
IMPRESSION: There is no pneumonia nor CHF nor other acute cardiopulmonary
abnormality. Stable chronic bronchitic changes.

Thoracic aortic atherosclerosis.

## 2017-07-02 DIAGNOSIS — J449 Chronic obstructive pulmonary disease, unspecified: Secondary | ICD-10-CM | POA: Diagnosis not present

## 2017-07-09 DIAGNOSIS — R69 Illness, unspecified: Secondary | ICD-10-CM | POA: Diagnosis not present

## 2017-08-01 DIAGNOSIS — J449 Chronic obstructive pulmonary disease, unspecified: Secondary | ICD-10-CM | POA: Diagnosis not present

## 2017-08-24 ENCOUNTER — Emergency Department (HOSPITAL_COMMUNITY): Payer: Medicare HMO

## 2017-08-24 ENCOUNTER — Inpatient Hospital Stay (HOSPITAL_COMMUNITY)
Admission: EM | Admit: 2017-08-24 | Discharge: 2017-08-27 | DRG: 644 | Disposition: A | Discharge status: Home Health | Payer: Medicare HMO | Attending: Internal Medicine | Admitting: Internal Medicine

## 2017-08-24 ENCOUNTER — Encounter (HOSPITAL_COMMUNITY): Payer: Self-pay | Admitting: *Deleted

## 2017-08-24 DIAGNOSIS — Z79899 Other long term (current) drug therapy: Secondary | ICD-10-CM

## 2017-08-24 DIAGNOSIS — R109 Unspecified abdominal pain: Secondary | ICD-10-CM

## 2017-08-24 DIAGNOSIS — W19XXXA Unspecified fall, initial encounter: Secondary | ICD-10-CM | POA: Diagnosis not present

## 2017-08-24 DIAGNOSIS — E876 Hypokalemia: Secondary | ICD-10-CM | POA: Diagnosis not present

## 2017-08-24 DIAGNOSIS — J441 Chronic obstructive pulmonary disease with (acute) exacerbation: Secondary | ICD-10-CM | POA: Diagnosis present

## 2017-08-24 DIAGNOSIS — M549 Dorsalgia, unspecified: Secondary | ICD-10-CM | POA: Diagnosis present

## 2017-08-24 DIAGNOSIS — E871 Hypo-osmolality and hyponatremia: Secondary | ICD-10-CM | POA: Diagnosis not present

## 2017-08-24 DIAGNOSIS — E222 Syndrome of inappropriate secretion of antidiuretic hormone: Secondary | ICD-10-CM | POA: Diagnosis not present

## 2017-08-24 DIAGNOSIS — I10 Essential (primary) hypertension: Secondary | ICD-10-CM | POA: Diagnosis not present

## 2017-08-24 DIAGNOSIS — Z9981 Dependence on supplemental oxygen: Secondary | ICD-10-CM

## 2017-08-24 DIAGNOSIS — Z7289 Other problems related to lifestyle: Secondary | ICD-10-CM | POA: Diagnosis present

## 2017-08-24 DIAGNOSIS — Y909 Presence of alcohol in blood, level not specified: Secondary | ICD-10-CM | POA: Diagnosis present

## 2017-08-24 DIAGNOSIS — F32A Depression, unspecified: Secondary | ICD-10-CM | POA: Diagnosis present

## 2017-08-24 DIAGNOSIS — Z789 Other specified health status: Secondary | ICD-10-CM | POA: Diagnosis not present

## 2017-08-24 DIAGNOSIS — K5730 Diverticulosis of large intestine without perforation or abscess without bleeding: Secondary | ICD-10-CM | POA: Diagnosis present

## 2017-08-24 DIAGNOSIS — S22000A Wedge compression fracture of unspecified thoracic vertebra, initial encounter for closed fracture: Secondary | ICD-10-CM | POA: Diagnosis not present

## 2017-08-24 DIAGNOSIS — E860 Dehydration: Secondary | ICD-10-CM | POA: Diagnosis present

## 2017-08-24 DIAGNOSIS — Z7951 Long term (current) use of inhaled steroids: Secondary | ICD-10-CM

## 2017-08-24 DIAGNOSIS — M546 Pain in thoracic spine: Secondary | ICD-10-CM | POA: Diagnosis not present

## 2017-08-24 DIAGNOSIS — I110 Hypertensive heart disease with heart failure: Secondary | ICD-10-CM | POA: Diagnosis present

## 2017-08-24 DIAGNOSIS — M545 Low back pain: Secondary | ICD-10-CM | POA: Diagnosis present

## 2017-08-24 DIAGNOSIS — S22080A Wedge compression fracture of T11-T12 vertebra, initial encounter for closed fracture: Secondary | ICD-10-CM | POA: Diagnosis not present

## 2017-08-24 DIAGNOSIS — J449 Chronic obstructive pulmonary disease, unspecified: Secondary | ICD-10-CM | POA: Diagnosis present

## 2017-08-24 DIAGNOSIS — D509 Iron deficiency anemia, unspecified: Secondary | ICD-10-CM | POA: Diagnosis present

## 2017-08-24 DIAGNOSIS — I700 Atherosclerosis of aorta: Secondary | ICD-10-CM | POA: Diagnosis present

## 2017-08-24 DIAGNOSIS — F1020 Alcohol dependence, uncomplicated: Secondary | ICD-10-CM | POA: Diagnosis present

## 2017-08-24 DIAGNOSIS — R0602 Shortness of breath: Secondary | ICD-10-CM | POA: Diagnosis not present

## 2017-08-24 DIAGNOSIS — K219 Gastro-esophageal reflux disease without esophagitis: Secondary | ICD-10-CM | POA: Diagnosis present

## 2017-08-24 DIAGNOSIS — D259 Leiomyoma of uterus, unspecified: Secondary | ICD-10-CM | POA: Diagnosis present

## 2017-08-24 DIAGNOSIS — F17200 Nicotine dependence, unspecified, uncomplicated: Secondary | ICD-10-CM | POA: Diagnosis present

## 2017-08-24 DIAGNOSIS — Z794 Long term (current) use of insulin: Secondary | ICD-10-CM

## 2017-08-24 DIAGNOSIS — M4854XA Collapsed vertebra, not elsewhere classified, thoracic region, initial encounter for fracture: Secondary | ICD-10-CM | POA: Diagnosis present

## 2017-08-24 DIAGNOSIS — E785 Hyperlipidemia, unspecified: Secondary | ICD-10-CM | POA: Diagnosis present

## 2017-08-24 DIAGNOSIS — E119 Type 2 diabetes mellitus without complications: Secondary | ICD-10-CM | POA: Diagnosis not present

## 2017-08-24 DIAGNOSIS — I509 Heart failure, unspecified: Secondary | ICD-10-CM | POA: Diagnosis present

## 2017-08-24 DIAGNOSIS — J9611 Chronic respiratory failure with hypoxia: Secondary | ICD-10-CM | POA: Diagnosis present

## 2017-08-24 DIAGNOSIS — T43225A Adverse effect of selective serotonin reuptake inhibitors, initial encounter: Secondary | ICD-10-CM | POA: Diagnosis present

## 2017-08-24 DIAGNOSIS — E1169 Type 2 diabetes mellitus with other specified complication: Secondary | ICD-10-CM | POA: Diagnosis not present

## 2017-08-24 DIAGNOSIS — F17210 Nicotine dependence, cigarettes, uncomplicated: Secondary | ICD-10-CM | POA: Diagnosis present

## 2017-08-24 DIAGNOSIS — F1090 Alcohol use, unspecified, uncomplicated: Secondary | ICD-10-CM | POA: Diagnosis present

## 2017-08-24 DIAGNOSIS — Z7982 Long term (current) use of aspirin: Secondary | ICD-10-CM

## 2017-08-24 DIAGNOSIS — Z8249 Family history of ischemic heart disease and other diseases of the circulatory system: Secondary | ICD-10-CM

## 2017-08-24 DIAGNOSIS — R531 Weakness: Secondary | ICD-10-CM | POA: Diagnosis present

## 2017-08-24 DIAGNOSIS — F10230 Alcohol dependence with withdrawal, uncomplicated: Secondary | ICD-10-CM | POA: Diagnosis present

## 2017-08-24 DIAGNOSIS — F329 Major depressive disorder, single episode, unspecified: Secondary | ICD-10-CM | POA: Diagnosis present

## 2017-08-24 DIAGNOSIS — T502X5A Adverse effect of carbonic-anhydrase inhibitors, benzothiadiazides and other diuretics, initial encounter: Secondary | ICD-10-CM | POA: Diagnosis present

## 2017-08-24 LAB — URINALYSIS, ROUTINE W REFLEX MICROSCOPIC
Bilirubin Urine: NEGATIVE
GLUCOSE, UA: NEGATIVE mg/dL
Hgb urine dipstick: NEGATIVE
Ketones, ur: 5 mg/dL — AB
Leukocytes, UA: NEGATIVE
Nitrite: NEGATIVE
PH: 5 (ref 5.0–8.0)
Protein, ur: NEGATIVE mg/dL
SPECIFIC GRAVITY, URINE: 1.018 (ref 1.005–1.030)

## 2017-08-24 LAB — IRON AND TIBC
Iron: 16 ug/dL — ABNORMAL LOW (ref 28–170)
SATURATION RATIOS: 5 % — AB (ref 10.4–31.8)
TIBC: 349 ug/dL (ref 250–450)
UIBC: 333 ug/dL

## 2017-08-24 LAB — CBC
HEMATOCRIT: 28.7 % — AB (ref 36.0–46.0)
HEMOGLOBIN: 9 g/dL — AB (ref 12.0–15.0)
MCH: 23.3 pg — ABNORMAL LOW (ref 26.0–34.0)
MCHC: 31.4 g/dL (ref 30.0–36.0)
MCV: 74.4 fL — AB (ref 78.0–100.0)
Platelets: 252 10*3/uL (ref 150–400)
RBC: 3.86 MIL/uL — AB (ref 3.87–5.11)
RDW: 17.3 % — ABNORMAL HIGH (ref 11.5–15.5)
WBC: 10.9 10*3/uL — ABNORMAL HIGH (ref 4.0–10.5)

## 2017-08-24 LAB — COMPREHENSIVE METABOLIC PANEL
ALT: 16 U/L (ref 0–44)
AST: 20 U/L (ref 15–41)
Albumin: 3.5 g/dL (ref 3.5–5.0)
Alkaline Phosphatase: 63 U/L (ref 38–126)
Anion gap: 15 (ref 5–15)
BUN: 12 mg/dL (ref 8–23)
CHLORIDE: 77 mmol/L — AB (ref 98–111)
CO2: 32 mmol/L (ref 22–32)
Calcium: 9.5 mg/dL (ref 8.9–10.3)
Creatinine, Ser: 0.76 mg/dL (ref 0.44–1.00)
GFR calc non Af Amer: >60 mL/min (ref >60)
Glucose, Bld: 83 mg/dL (ref 70–99)
POTASSIUM: 3.4 mmol/L — AB (ref 3.5–5.1)
SODIUM: 124 mmol/L — AB (ref 135–145)
Total Bilirubin: 0.9 mg/dL (ref 0.3–1.2)
Total Protein: 6.2 g/dL — ABNORMAL LOW (ref 6.5–8.1)

## 2017-08-24 LAB — RETICULOCYTES
RBC.: 3.75 MIL/uL — AB (ref 3.87–5.11)
RETIC CT PCT: 2.7 % (ref 0.4–3.1)
Retic Count, Absolute: 101.3 10*3/uL (ref 19.0–186.0)

## 2017-08-24 LAB — FOLATE: Folate: 34 ng/mL (ref >5.9)

## 2017-08-24 LAB — FERRITIN: FERRITIN: 13 ng/mL (ref 11–307)

## 2017-08-24 LAB — OSMOLALITY: Osmolality: 252 mOsm/kg — ABNORMAL LOW (ref 275–295)

## 2017-08-24 LAB — PHOSPHORUS: PHOSPHORUS: 3.3 mg/dL (ref 2.5–4.6)

## 2017-08-24 LAB — MAGNESIUM: MAGNESIUM: 1.1 mg/dL — AB (ref 1.7–2.4)

## 2017-08-24 LAB — VITAMIN B12: Vitamin B-12: 537 pg/mL (ref 180–914)

## 2017-08-24 LAB — BRAIN NATRIURETIC PEPTIDE: B Natriuretic Peptide: 73.1 pg/mL (ref 0.0–100.0)

## 2017-08-24 LAB — CBG MONITORING, ED: Glucose-Capillary: 113 mg/dL — ABNORMAL HIGH (ref 70–99)

## 2017-08-24 LAB — LIPASE, BLOOD: LIPASE: 36 U/L (ref 11–51)

## 2017-08-24 MED ORDER — SODIUM CHLORIDE 0.9 % IV SOLN
INTRAVENOUS | Status: DC
  Administered 2017-08-24 – 2017-08-25 (×2): via INTRAVENOUS

## 2017-08-24 MED ORDER — INSULIN ASPART 100 UNIT/ML ~~LOC~~ SOLN: 0.0000 [IU] | Freq: Every day | SUBCUTANEOUS | Status: DC

## 2017-08-24 MED ORDER — IPRATROPIUM BROMIDE HFA 17 MCG/ACT IN AERS: 2.0000 | INHALATION_SPRAY | RESPIRATORY_TRACT | Status: DC

## 2017-08-24 MED ORDER — ONDANSETRON HCL 4 MG PO TABS: 4.0000 mg | ORAL_TABLET | Freq: Four times a day (QID) | ORAL | Status: DC | PRN

## 2017-08-24 MED ORDER — INSULIN ASPART 100 UNIT/ML ~~LOC~~ SOLN
0.0000 [IU] | Freq: Three times a day (TID) | SUBCUTANEOUS | Status: DC
  Administered 2017-08-25 (×3): 1 [IU] via SUBCUTANEOUS

## 2017-08-24 MED ORDER — MOMETASONE FURO-FORMOTEROL FUM 200-5 MCG/ACT IN AERO
2.0000 | INHALATION_SPRAY | Freq: Two times a day (BID) | RESPIRATORY_TRACT | Status: DC
  Administered 2017-08-25 – 2017-08-27 (×5): 2 via RESPIRATORY_TRACT
  Filled 2017-08-24 (×2): qty 8.8

## 2017-08-24 MED ORDER — ADULT MULTIVITAMIN W/MINERALS CH
1.0000 | ORAL_TABLET | Freq: Every day | ORAL | Status: DC
  Administered 2017-08-25 – 2017-08-27 (×3): 1 via ORAL
  Filled 2017-08-24 (×4): qty 1

## 2017-08-24 MED ORDER — OXYCODONE-ACETAMINOPHEN 5-325 MG PO TABS
1.0000 | ORAL_TABLET | ORAL | Status: DC | PRN
  Administered 2017-08-25 – 2017-08-27 (×6): 1 via ORAL
  Filled 2017-08-24 (×6): qty 1

## 2017-08-24 MED ORDER — AMLODIPINE BESYLATE 10 MG PO TABS
10.0000 mg | ORAL_TABLET | Freq: Every day | ORAL | Status: DC
  Administered 2017-08-25 – 2017-08-27 (×3): 10 mg via ORAL
  Filled 2017-08-24: qty 1
  Filled 2017-08-24: qty 2
  Filled 2017-08-24 (×2): qty 1

## 2017-08-24 MED ORDER — ACETAMINOPHEN 325 MG PO TABS
650.0000 mg | ORAL_TABLET | Freq: Four times a day (QID) | ORAL | Status: DC | PRN
  Administered 2017-08-27: 650 mg via ORAL
  Filled 2017-08-24: qty 2

## 2017-08-24 MED ORDER — IPRATROPIUM-ALBUTEROL 0.5-2.5 (3) MG/3ML IN SOLN
3.0000 mL | RESPIRATORY_TRACT | Status: DC
  Administered 2017-08-24 – 2017-08-25 (×4): 3 mL via RESPIRATORY_TRACT
  Filled 2017-08-24 (×4): qty 3

## 2017-08-24 MED ORDER — ONDANSETRON HCL 4 MG/2ML IJ SOLN: 4.0000 mg | Freq: Four times a day (QID) | INTRAMUSCULAR | Status: DC | PRN

## 2017-08-24 MED ORDER — FOLIC ACID 1 MG PO TABS
1.0000 mg | ORAL_TABLET | Freq: Every day | ORAL | Status: DC
  Administered 2017-08-24 – 2017-08-27 (×4): 1 mg via ORAL
  Filled 2017-08-24 (×4): qty 1

## 2017-08-24 MED ORDER — IOPAMIDOL (ISOVUE-300) INJECTION 61%
100.0000 mL | Freq: Once | INTRAVENOUS | Status: AC | PRN
  Administered 2017-08-24: 100 mL via INTRAVENOUS

## 2017-08-24 MED ORDER — HYDRALAZINE HCL 20 MG/ML IJ SOLN: 5.0000 mg | INTRAMUSCULAR | Status: DC | PRN

## 2017-08-24 MED ORDER — PANTOPRAZOLE SODIUM 40 MG PO TBEC
40.0000 mg | DELAYED_RELEASE_TABLET | Freq: Every day | ORAL | Status: DC
  Administered 2017-08-25 – 2017-08-27 (×3): 40 mg via ORAL
  Filled 2017-08-24 (×3): qty 1

## 2017-08-24 MED ORDER — NICOTINE 21 MG/24HR TD PT24
21.0000 mg | MEDICATED_PATCH | Freq: Every day | TRANSDERMAL | Status: DC
  Filled 2017-08-24 (×2): qty 1

## 2017-08-24 MED ORDER — ATORVASTATIN CALCIUM 20 MG PO TABS
20.0000 mg | ORAL_TABLET | Freq: Every evening | ORAL | Status: DC
  Administered 2017-08-24 – 2017-08-26 (×3): 20 mg via ORAL
  Filled 2017-08-24 (×3): qty 1

## 2017-08-24 MED ORDER — SENNOSIDES-DOCUSATE SODIUM 8.6-50 MG PO TABS: 1.0000 | ORAL_TABLET | Freq: Every evening | ORAL | Status: DC | PRN

## 2017-08-24 MED ORDER — PAROXETINE HCL 30 MG PO TABS
30.0000 mg | ORAL_TABLET | Freq: Every day | ORAL | Status: DC
  Administered 2017-08-25 – 2017-08-27 (×3): 30 mg via ORAL
  Filled 2017-08-24 (×4): qty 1

## 2017-08-24 MED ORDER — GUAIFENESIN ER 600 MG PO TB12: 600.0000 mg | ORAL_TABLET | Freq: Two times a day (BID) | ORAL | Status: DC | PRN

## 2017-08-24 MED ORDER — HYDROCODONE-ACETAMINOPHEN 5-325 MG PO TABS
1.0000 | ORAL_TABLET | Freq: Once | ORAL | Status: AC
  Administered 2017-08-24: 1 via ORAL
  Filled 2017-08-24: qty 1

## 2017-08-24 MED ORDER — ACETAMINOPHEN 650 MG RE SUPP: 650.0000 mg | Freq: Four times a day (QID) | RECTAL | Status: DC | PRN

## 2017-08-24 MED ORDER — IOPAMIDOL (ISOVUE-300) INJECTION 61%
INTRAVENOUS | Status: AC
  Filled 2017-08-24: qty 100

## 2017-08-24 MED ORDER — VITAMIN B-1 100 MG PO TABS
100.0000 mg | ORAL_TABLET | Freq: Every day | ORAL | Status: DC
  Administered 2017-08-24 – 2017-08-27 (×4): 100 mg via ORAL
  Filled 2017-08-24 (×4): qty 1

## 2017-08-24 MED ORDER — ZOLPIDEM TARTRATE 5 MG PO TABS: 5.0000 mg | ORAL_TABLET | Freq: Every evening | ORAL | Status: DC | PRN

## 2017-08-24 MED ORDER — POTASSIUM CHLORIDE 20 MEQ/15ML (10%) PO SOLN
20.0000 meq | Freq: Once | ORAL | Status: AC
  Administered 2017-08-24: 20 meq via ORAL
  Filled 2017-08-24: qty 15

## 2017-08-24 MED ORDER — ASPIRIN EC 81 MG PO TBEC
81.0000 mg | DELAYED_RELEASE_TABLET | Freq: Every day | ORAL | Status: DC
  Administered 2017-08-25 – 2017-08-27 (×3): 81 mg via ORAL
  Filled 2017-08-24 (×3): qty 1

## 2017-08-24 MED ORDER — LORAZEPAM 2 MG/ML IJ SOLN: 0.0000 mg | Freq: Four times a day (QID) | INTRAMUSCULAR | Status: AC

## 2017-08-24 MED ORDER — ALBUTEROL SULFATE (2.5 MG/3ML) 0.083% IN NEBU: 2.5000 mg | INHALATION_SOLUTION | RESPIRATORY_TRACT | Status: DC | PRN

## 2017-08-24 MED ORDER — LORAZEPAM 2 MG/ML IJ SOLN: 1.0000 mg | Freq: Four times a day (QID) | INTRAMUSCULAR | Status: DC | PRN

## 2017-08-24 MED ORDER — ENOXAPARIN SODIUM 40 MG/0.4ML ~~LOC~~ SOLN
40.0000 mg | Freq: Every day | SUBCUTANEOUS | Status: DC
  Administered 2017-08-25 – 2017-08-27 (×3): 40 mg via SUBCUTANEOUS
  Filled 2017-08-24 (×3): qty 0.4

## 2017-08-24 MED ORDER — LORAZEPAM 2 MG/ML IJ SOLN: 0.0000 mg | Freq: Two times a day (BID) | INTRAMUSCULAR | Status: DC

## 2017-08-24 MED ORDER — THIAMINE HCL 100 MG/ML IJ SOLN: 100.0000 mg | Freq: Every day | INTRAMUSCULAR | Status: DC

## 2017-08-24 MED ORDER — METHOCARBAMOL 500 MG PO TABS: 500.0000 mg | ORAL_TABLET | Freq: Three times a day (TID) | ORAL | Status: DC | PRN

## 2017-08-24 MED ORDER — LORAZEPAM 1 MG PO TABS: 1.0000 mg | ORAL_TABLET | Freq: Four times a day (QID) | ORAL | Status: DC | PRN

## 2017-08-24 NOTE — ED Notes (Signed)
Niu, MD at bedside. °

## 2017-08-24 NOTE — H&P (Addendum)
History and Physical    [name]:[id number] DOB: 05-01-1945 DOA: 08/24/2017  Referring MD/NP/PA:   PCP: Lujean Amel, MD   Patient coming from:  The patient is coming from home.  At baseline, pt is independent for most of ADL.   Chief Complaint: Generalized weakness, fatigue, bilateral back, fall  HPI: [name] is a 72 y.o. female with medical history significant of hypertension, hyperlipidemia, diabetes mellitus, COPD on 2 L nasal cannula oxygen, GERD, depression, CHF, tobacco abuse, alcohol use, who presents with generalized weakness, fatigue, bilateral back pain and fall.  Patient states that she has been having generalized weakness and fatigue, feeling tired all the time recently, which has worsened in past 2 days.  She also has poor appetite and decreased oral intake.  Patient does not have unilateral numbness or tingling his extremities, no facial droop, slurred speech, vision change or hearing loss.  Patient states that she had a fall last week, injured her back.  She complains of bilateral mid and lower back pain, which is constant, sharp, 5 out of 10 severity, nonradiating.  No urinary incontinence or loss control for bowel movement.  No leg numbness.  She strongly denies any head injury or neck injury.  No headache or neck pain.  She states that due to uncontrolled blood pressure and leg edema, her doctor started her with HCTZ which she has been taking in the past several months.  Patient states that he has chronic mild cough and shortness breath due to COPD, which is at baseline.  Patient states that she drinks whiskey every day, 2 to 3 glasses each day.  ED Course: pt was found to have WBC 10.9, lipase 36, negative urinalysis, hemoglobin 9.0 which was 10.7 on 04/23/2016, potassium 3.4, sodium 124, temperature normal, heart rate 90s, tachypnea, oxygen saturation 98% on room air, which improved to 97% on 2 L nasal cannula oxygen.  Patient is placed on telemetry  bed of observation.  CT abdomen/pelvis showed:  1. Moderate to severe compression fracture of T12 with a mild compression fracture of T11. These findings are new since the prior CT consistent with the reported fall last week. 2. No other acute or recent abnormalities. 3. Decreased enhancement of portions of the right kidney consistent with significant renal vascular disease.  4. Extensive aortic atherosclerosis. 5. 4.9 cm uterine fibroid. 6. Numerous colonic diverticula without evidence of diverticulitis.  Review of Systems:   General: no fevers, chills, no body weight gain, has poor appetite, has fatigue HEENT: no blurry vision, hearing changes or sore throat Respiratory: has dyspnea, coughing, no wheezing CV: no chest pain, no palpitations GI: no nausea, vomiting, abdominal pain, diarrhea, constipation GU: no dysuria, burning on urination, increased urinary frequency, hematuria  Ext: has mild leg edema Neuro: no unilateral weakness, numbness, or tingling, no vision change or hearing loss. Had fall. Skin: no rash, no skin tear. MSK: has back pain Heme: No easy bruising.  Travel history: No recent long distant travel.  Allergy: No Known Allergies  Past Medical History:  Diagnosis Date  . COPD (chronic obstructive pulmonary disease) ([place])   . Diabetes mellitus   . Diastolic dysfunction    history  . Hyperlipidemia   . Hypertension     Past Surgical History:  Procedure Laterality Date  . TUBAL LIGATION      Social History:  reports that she has been smoking. She has a 52.00 pack-year smoking history. She has never used smokeless tobacco. Her alcohol and drug  histories are not on file.  Family History:  Family History  Problem Relation Age of Onset  . Heart disease Father   . Lung cancer Father      Prior to Admission medications   Medication Sig Start Date End Date Taking? Authorizing Provider  albuterol (PROVENTIL) (2.5 MG/3ML) 0.083% nebulizer solution Take 3 mLs  (2.5 mg total) by nebulization every 2 (two) hours as needed for wheezing or shortness of breath. 04/23/16  Yes Ghimire, Henreitta Leber, MD  amLODipine-valsartan (EXFORGE) 5-160 MG tablet Take 1 tablet by mouth daily.   Yes [provider]  aspirin EC 81 MG tablet Take 81 mg by mouth daily.   Yes [provider]  atorvastatin (LIPITOR) 20 MG tablet Take 20 mg by mouth every evening.   Yes [provider]  BYETTA 10 MCG PEN 10 MCG/0.04ML SOPN injection Inject 10 mcg into the skin 2 (two) times daily. 07/06/17  Yes [provider]  hydrochlorothiazide (HYDRODIURIL) 12.5 MG tablet Take 12.5 mg by mouth daily. 08/18/17  Yes [provider]  ipratropium (ATROVENT HFA) 17 MCG/ACT inhaler Inhale 2 puffs into the lungs every 6 (six) hours as needed for wheezing.   Yes [provider]  omeprazole (PRILOSEC) 20 MG capsule Take 1 tablet by mouth daily. 11/18/10  Yes [provider]  PARoxetine (PAXIL) 30 MG tablet Take 30 mg by mouth daily.    Yes [provider]  sitaGLIPtan-metformin (JANUMET) 50-1000 MG per tablet Take 1 tablet by mouth 2 (two) times daily with a meal.     Yes [provider]  SYMBICORT 160-4.5 MCG/ACT inhaler Inhale 2 puffs into the lungs 2 (two) times daily. 08/09/17  Yes [provider]  arformoterol (BROVANA) 15 MCG/2ML NEBU Take 2 mLs (15 mcg total) by nebulization 2 (two) times daily. Patient not taking: Reported on 08/24/2017 04/23/16   Jonetta Osgood, MD  budesonide (PULMICORT) 0.5 MG/2ML nebulizer solution Take 2 mLs (0.5 mg total) by nebulization 2 (two) times daily. Patient not taking: Reported on 08/24/2017 04/23/16   Jonetta Osgood, MD  diazepam (VALIUM) 5 MG tablet Take 0.5 tablets (2.5 mg total) by mouth every 12 (twelve) hours. Patient not taking: Reported on 08/24/2017 04/26/16   Hollace Kinnier L, DO  guaiFENesin (MUCINEX) 600 MG 12 hr tablet Take 2 tablets (1,200 mg total) by mouth 2 (two)  times daily. Patient not taking: Reported on 08/24/2017 04/23/16   Jonetta Osgood, MD  insulin aspart (NOVOLOG) 100 UNIT/ML injection 0-9 Units, Subcutaneous, 3 times daily with meals CBG < 70: implement hypoglycemia protocol CBG 70 - 120: 0 units CBG 121 - 150: 1 unit CBG 151 - 200: 2 units CBG 201 - 250: 3 units CBG 251 - 300: 5 units CBG 301 - 350: 7 units CBG 351 - 400: 9 units CBG > 400: call MD Patient not taking: Reported on 08/24/2017 04/23/16   Jonetta Osgood, MD  insulin aspart (NOVOLOG) 100 UNIT/ML injection Inject 4 Units into the skin 3 (three) times daily with meals. Patient not taking: Reported on 08/24/2017 04/23/16   Jonetta Osgood, MD  ipratropium-albuterol (DUONEB) 0.5-2.5 (3) MG/3ML SOLN Take 3 mLs by nebulization 3 (three) times daily. Patient not taking: Reported on 08/24/2017 04/23/16   Jonetta Osgood, MD    Physical Exam: Vitals:   08/24/17 2146 08/24/17 2200 08/24/17 2230 08/24/17 2245  BP: (!) 156/71 (!) 146/61 (!) 138/56 140/70  Pulse: 87 81 83 80  Resp:  17 (!)  21 (!) 22  Temp:      TempSrc:      SpO2:  96% 96% 96%   General: Not in acute distress HEENT:       Eyes: PERRL, EOMI, no scleral icterus.       ENT: No discharge from the ears and nose, no pharynx injection, no tonsillar enlargement.        Neck: No JVD, no bruit, no mass felt. Heme: No neck lymph node enlargement. Cardiac: S1/S2, RRR, No murmurs, No gallops or rubs. Respiratory: No rales, wheezing, rhonchi or rubs. GI: Soft, nondistended, nontender, no rebound pain, no organomegaly, BS present. GU: No hematuria Ext: has trace leg edema bilaterally. 2+DP/PT pulse bilaterally. Musculoskeletal: has tenderness in the mid and lower back in the midline. Skin: No rashes.  Neuro: Alert, oriented X3, cranial nerves II-XII grossly intact, moves all extremities normally. Psych: Patient is not psychotic, no suicidal or hemocidal ideation.  Labs on Admission: I have personally reviewed  following labs and imaging studies  CBC: Recent Labs  Lab 08/24/17 1415  WBC 10.9*  HGB 9.0*  HCT 28.7*  MCV 74.4*  PLT 716   Basic Metabolic Panel: Recent Labs  Lab 08/24/17 1415 08/24/17 2151  NA 124*  --   K 3.4*  --   CL 77*  --   CO2 32  --   GLUCOSE 83  --   BUN 12  --   CREATININE 0.76  --   CALCIUM 9.5  --   MG  --  1.1*  PHOS  --  3.3   GFR: CrCl cannot be calculated (Unknown ideal weight.). Liver Function Tests: Recent Labs  Lab 08/24/17 1415  AST 20  ALT 16  ALKPHOS 63  BILITOT 0.9  PROT 6.2*  ALBUMIN 3.5   Recent Labs  Lab 08/24/17 1415  LIPASE 36   No results for input(s): AMMONIA in the last 168 hours. Coagulation Profile: No results for input(s): INR, PROTIME in the last 168 hours. Cardiac Enzymes: No results for input(s): CKTOTAL, CKMB, CKMBINDEX, TROPONINI in the last 168 hours. BNP (last 3 results) No results for input(s): PROBNP in the last 8760 hours. HbA1C: No results for input(s): HGBA1C in the last 72 hours. CBG: Recent Labs  Lab 08/24/17 2129  GLUCAP 113*   Lipid Profile: No results for input(s): CHOL, HDL, LDLCALC, TRIG, CHOLHDL, LDLDIRECT in the last 72 hours. Thyroid Function Tests: No results for input(s): TSH, T4TOTAL, FREET4, T3FREE, THYROIDAB in the last 72 hours. Anemia Panel: Recent Labs    08/24/17 2151  RETICCTPCT 2.7   Urine analysis:    Component Value Date/Time   COLORURINE YELLOW 08/24/2017 2000   APPEARANCEUR CLEAR 08/24/2017 2000   LABSPEC 1.018 08/24/2017 2000   PHURINE 5.0 08/24/2017 2000   GLUCOSEU NEGATIVE 08/24/2017 2000   HGBUR NEGATIVE 08/24/2017 2000   [place] NEGATIVE 08/24/2017 2000   KETONESUR 5 (A) 08/24/2017 2000   PROTEINUR NEGATIVE 08/24/2017 2000   NITRITE NEGATIVE 08/24/2017 2000   LEUKOCYTESUR NEGATIVE 08/24/2017 2000   Sepsis Labs: @LABRCNTIP (procalcitonin:4,lacticidven:4) )No results found for this or any previous visit (from the past 240 hour(s)).   Radiological  Exams on Admission: Ct Abdomen Pelvis W Contrast  Result Date: 08/24/2017 CLINICAL DATA:  Fall last week. Patient now bilateral flank pain and nausea. EXAM: CT ABDOMEN AND PELVIS WITH CONTRAST TECHNIQUE: Multidetector CT imaging of the abdomen and pelvis was performed using the standard protocol following bolus administration of intravenous contrast. CONTRAST:  131mL ISOVUE-300 IOPAMIDOL (ISOVUE-300)  INJECTION 61% COMPARISON:  None. FINDINGS: Lower chest: Minor lung base scarring.  No acute findings. Hepatobiliary: No focal liver abnormality is seen. No gallstones, gallbladder wall thickening, or biliary dilatation. Pancreas: Unremarkable. No pancreatic ductal dilatation or surrounding inflammatory changes. Spleen: Normal in size without focal abnormality. Adrenals/Urinary Tract: No adrenal masses. 5 mm low-density mass, midpole left kidney, consistent with a cyst. No other renal masses. There is decreased enhancement of the mid to upper pole the right kidney in the posterior aspect of the lower pole. This appears vascular in etiology likely due to chronic atherosclerotic vessel narrowing. No intrarenal stones. No hydronephrosis. Ureters are normal in course and in caliber. Bladder is unremarkable. Stomach/Bowel: No evidence of bowel injury. Stomach is unremarkable. Bowel is normal in caliber. No wall thickening or inflammation. There are numerous left colon diverticula mostly along the sigmoid. Normal appendix visualized. Vascular/Lymphatic: Dense aortic atherosclerosis and mild ectasia. Maximum AP diameter of 2.2 cm. No pathologically enlarged lymph nodes. Reproductive: Right posterior subserosal uterine fibroid measuring 4.9 cm. No adnexal masses. Other: No abdominal wall hernia.  No ascites. Musculoskeletal: There is a moderate to severe compression fracture of T12 with mild depression of the upper endplate of H29. Both of these findings are new from a chest CT dated 10/08/2016. There is mild haziness in the  perivertebral fat adjacent to the T12 vertebra consistent with edema. No other fractures.  No osteoblastic or osteolytic lesions. IMPRESSION: 1. Moderate to severe compression fracture of T12 with a mild compression fracture of T11. These findings are new since the prior CT consistent with the reported fall last week. 2. No other acute or recent abnormalities. 3. Decreased enhancement of portions of the right kidney consistent with significant renal vascular disease. Consider nonemergent follow-up renal CTA for further assessment. 4. Extensive aortic atherosclerosis. 5. 4.9 cm uterine fibroid. 6. Numerous colonic diverticula without evidence of diverticulitis. Electronically Signed   By: Lajean Manes M.D.   On: 08/24/2017 20:40     EKG:  Not done in ED, will get one.   Assessment/Plan Principal Problem:   Hyponatremia Active Problems:   Essential hypertension   COPD exacerbation ([facility])   Smoker   Dyslipidemia associated with type 2 diabetes mellitus (H[facility])   Diabetes mellitus, type II, insulin dependent ([place])   GERD without esophagitis   Bilateral back pain   Compression fracture of thoracic vertebra ([facility])   Hypokalemia   Fall   Microcytic anemia   Alcohol use   Depression   Hyponatremia: Na 124.  Mental status normal. Most likely due to multifactorial etiology, including oor oral intake, dehydration, potomania due to alcohol use, HCTZ use.   - will place on tele bed for obs - Will check urine sodium, urine osmolality, serum osmolality. -  check TSH -  Fluid restriction -  IVF: normal saline at 75 mL/h - f/u by BMP, at 2:00 am and 5:00 AM  Essential hypertension: -hold HCTZ -switch Exfarge to amlodipine alone and increased dose of amlodipine from 5 to 10 mg daily (hold Varlsartan) due to hyponatremia -IV hydralazine as needed  COPD exacerbation and chronic respiratory failure with hypoxia: Stable. -DuoNeb nebulizers, Dulera inhaler, PRN albuterol nebulizers, -As needed Mucinex  for cough  Tobacco abuse and Alcohol use: -Did counseling about importance of quitting smoking and drinking alcohol -Nicotine patch -CIWA protocol  Dyslipidemia associated with type 2 diabetes mellitus ([place]): -lipitor  Diabetes mellitus, type II, insulin dependent ([place]): Last A1c 6.0 on 04/21/17, well controled. Patient is taking Janumet  and Byetta at home -SSI  GERD without esophagitis: -protonix  Bilateral back pain due to compression fracture of thoracic vertebra ([place]): -PRN Percocet and Robaxin  Hypokalemia: K= 3.4  on admission. - Repleted - Check Mg level and Phosphorus  Addendum: Mg 1.1 and Phosphorus 3.3 -will give 2 g of magnesium sulfate  Fall and generalized weakness: -PT/OT  Microcytic anemia: Hemoglobin 9.0 which was 10.7 on 04/23/2016, MCV 74.4 -anemia panel -Patient may need f/u with GI and to repeat colonoscopy as outpatient  Depression: -Continue home Paxil   DVT ppx:  SQ Lovenox Code Status: Full code Family Communication:   Yes, patient's brother   at bed side Disposition Plan:  Anticipate discharge back to previous home environment Consults called:  none Admission status: Obs / tele    Date of Service 08/24/2017    Ivor Costa [facility] Pager [phone]  If 7PM-7AM, please contact night-coverage [email or web address] Password [facility] 08/24/2017, 10:55 PM

## 2017-08-24 NOTE — ED Notes (Signed)
Patient transported to CT 

## 2017-08-24 NOTE — ED Notes (Signed)
[place] sandwich and sprite given

## 2017-08-24 NOTE — ED Notes (Signed)
cbg 113 

## 2017-08-24 NOTE — ED Triage Notes (Signed)
Pt in c/o bilateral flank pain that started a few days ago, states she fell last week and hurt her back but now she feels like the pain is coming from her kidneys, denies urinary symptoms, c/o nausea

## 2017-08-24 NOTE — ED Notes (Signed)
ED Provider at bedside. 

## 2017-08-24 NOTE — ED Provider Notes (Signed)
[facility] EMERGENCY DEPARTMENT Provider Note   CSN: [id number] Arrival date & time: 08/24/17  1359     History   Chief Complaint Chief Complaint  Patient presents with  . Flank Pain    HPI [name] is a 72 y.o. female.  HPI  72 year old female with a history of COPD on chronic oxygen, diabetes, hypertension and hyperlipidemia presents with back pain and abdominal pain.  About a week ago she fell injuring her back.  She states since then she is been having some back pain in the next day started having some abdominal pain.  It is bilateral lower.  Back pain is bilateral but not really midline.  No vomiting or diarrhea or constipation.  No urinary symptoms.  Brother and patient also notes she is been feeling weak and fatigued for a couple weeks.  Is having less and less energy and harder to get up out of bed.  She is also had decreased appetite.  She recently started a new medicine for leg swelling and hypertension by her PCP but does not remember the name.  Past Medical History:  Diagnosis Date  . COPD (chronic obstructive pulmonary disease) ([place])   . Diabetes mellitus   . Diastolic dysfunction    history  . Hyperlipidemia   . Hypertension     Patient Active Problem List   Diagnosis Date Noted  . Bilateral flank pain 08/24/2017  . Compression fracture of thoracic vertebra ([facility]) 08/24/2017  . Hyponatremia 08/24/2017  . Hypokalemia 08/24/2017  . Fall 08/24/2017  . Microcytic anemia 08/24/2017  . Dyslipidemia associated with type 2 diabetes mellitus ([place]) 04/28/2016  . Diabetes mellitus, type II, insulin dependent ([place]) 04/28/2016  . GERD without esophagitis 04/28/2016  . O2 dependent 04/28/2016  . Community acquired pneumonia   . Sepsis due to pneumonia ([place]) 04/18/2016  . COPD exacerbation ([place]) 12/04/2010  . Smoker 12/04/2010  . Essential hypertension 01/31/2008  . Chronic respiratory failure ([place]) 01/31/2008    Past Surgical History:    Procedure Laterality Date  . TUBAL LIGATION       OB History   None      Home Medications    Prior to Admission medications   Medication Sig Start Date End Date Taking? Authorizing Provider  albuterol (PROVENTIL) (2.5 MG/3ML) 0.083% nebulizer solution Take 3 mLs (2.5 mg total) by nebulization every 2 (two) hours as needed for wheezing or shortness of breath. 04/23/16  Yes Ghimire, Henreitta Leber, MD  amLODipine-valsartan (EXFORGE) 5-160 MG tablet Take 1 tablet by mouth daily.   Yes [provider]  aspirin EC 81 MG tablet Take 81 mg by mouth daily.   Yes [provider]  atorvastatin (LIPITOR) 20 MG tablet Take 20 mg by mouth every evening.   Yes [provider]  BYETTA 10 MCG PEN 10 MCG/0.04ML SOPN injection Inject 10 mcg into the skin 2 (two) times daily. 07/06/17  Yes [provider]  hydrochlorothiazide (HYDRODIURIL) 12.5 MG tablet Take 12.5 mg by mouth daily. 08/18/17  Yes [provider]  ipratropium (ATROVENT HFA) 17 MCG/ACT inhaler Inhale 2 puffs into the lungs every 6 (six) hours as needed for wheezing.   Yes [provider]  omeprazole (PRILOSEC) 20 MG capsule Take 1 tablet by mouth daily. 11/18/10  Yes [provider]  PARoxetine (PAXIL) 30 MG tablet Take 30 mg by mouth daily.    Yes [provider]  sitaGLIPtan-metformin (JANUMET) 50-1000 MG per tablet Take 1 tablet by mouth  2 (two) times daily with a meal.     Yes [provider]  SYMBICORT 160-4.5 MCG/ACT inhaler Inhale 2 puffs into the lungs 2 (two) times daily. 08/09/17  Yes [provider]  arformoterol (BROVANA) 15 MCG/2ML NEBU Take 2 mLs (15 mcg total) by nebulization 2 (two) times daily. Patient not taking: Reported on 08/24/2017 04/23/16   Jonetta Osgood, MD  budesonide (PULMICORT) 0.5 MG/2ML nebulizer solution Take 2 mLs (0.5 mg total) by nebulization 2 (two) times daily. Patient not taking: Reported on 08/24/2017 04/23/16   Jonetta Osgood, MD  diazepam (VALIUM) 5 MG tablet Take 0.5 tablets (2.5 mg total) by mouth every 12 (twelve) hours. Patient not taking: Reported on 08/24/2017 04/26/16   Hollace Kinnier L, DO  guaiFENesin (MUCINEX) 600 MG 12 hr tablet Take 2 tablets (1,200 mg total) by mouth 2 (two) times daily. Patient not taking: Reported on 08/24/2017 04/23/16   Jonetta Osgood, MD  insulin aspart (NOVOLOG) 100 UNIT/ML injection 0-9 Units, Subcutaneous, 3 times daily with meals CBG < 70: implement hypoglycemia protocol CBG 70 - 120: 0 units CBG 121 - 150: 1 unit CBG 151 - 200: 2 units CBG 201 - 250: 3 units CBG 251 - 300: 5 units CBG 301 - 350: 7 units CBG 351 - 400: 9 units CBG > 400: call MD Patient not taking: Reported on 08/24/2017 04/23/16   Jonetta Osgood, MD  insulin aspart (NOVOLOG) 100 UNIT/ML injection Inject 4 Units into the skin 3 (three) times daily with meals. Patient not taking: Reported on 08/24/2017 04/23/16   Jonetta Osgood, MD  ipratropium-albuterol (DUONEB) 0.5-2.5 (3) MG/3ML SOLN Take 3 mLs by nebulization 3 (three) times daily. Patient not taking: Reported on 08/24/2017 04/23/16   Jonetta Osgood, MD    Family History Family History  Problem Relation Age of Onset  . Heart disease Father   . Lung cancer Father     Social History Social History   Tobacco Use  . Smoking status: Current Every Day Smoker    Packs/day: 1.00    Years: 52.00    Pack years: 52.00  . Smokeless tobacco: Never Used  Substance Use Topics  . Alcohol use: Not on file  . Drug use: Not on file     Allergies   Patient has no known allergies.   Review of Systems Review of Systems  Constitutional: Positive for fatigue. Negative for fever.  Respiratory: Positive for shortness of breath (chronic, unchanged). Negative for cough.   Cardiovascular: Negative for chest pain.  Gastrointestinal: Positive for abdominal pain. Negative for constipation, diarrhea and vomiting.  Genitourinary: Negative for  dysuria.  Musculoskeletal: Positive for back pain.  Neurological: Positive for weakness.  All other systems reviewed and are negative.    Physical Exam Updated Vital Signs BP (!) 170/67   Pulse 98   Temp 97.9 F (36.6 C) (Oral)   Resp (!) 22   SpO2 97%   Physical Exam  Constitutional: She is oriented to person, place, and time. She appears well-developed and well-nourished.  HENT:  Head: Normocephalic and atraumatic.  Right Ear: External ear normal.  Left Ear: External ear normal.  Nose: Nose normal.  Eyes: Right eye exhibits no discharge. Left eye exhibits no discharge.  Cardiovascular: Normal rate, regular rhythm and normal heart sounds.  Pulmonary/Chest: Effort normal and breath sounds normal.  Abdominal: Soft. There is tenderness in the right lower quadrant.  Musculoskeletal:       Thoracic back: She  exhibits tenderness. She exhibits no bony tenderness.       Lumbar back: She exhibits no bony tenderness.       Back:  Neurological: She is alert and oriented to person, place, and time.  Awake, alert, appropriate. 5/5 strength in all 4 extremities.   Skin: Skin is warm and dry.  Nursing note and vitals reviewed.    ED Treatments / Results  Labs (all labs ordered are listed, but only abnormal results are displayed) Labs Reviewed  COMPREHENSIVE METABOLIC PANEL - Abnormal; Notable for the following components:      Result Value   Sodium 124 (*)    Potassium 3.4 (*)    Chloride 77 (*)    Total Protein 6.2 (*)    All other components within normal limits  CBC - Abnormal; Notable for the following components:   WBC 10.9 (*)    RBC 3.86 (*)    Hemoglobin 9.0 (*)    HCT 28.7 (*)    MCV 74.4 (*)    MCH 23.3 (*)    RDW 17.3 (*)    All other components within normal limits  URINALYSIS, ROUTINE W REFLEX MICROSCOPIC - Abnormal; Notable for the following components:   Ketones, ur 5 (*)    All other components within normal limits  LIPASE, BLOOD     EKG None  Radiology Ct Abdomen Pelvis W Contrast  Result Date: 08/24/2017 CLINICAL DATA:  Fall last week. Patient now bilateral flank pain and nausea. EXAM: CT ABDOMEN AND PELVIS WITH CONTRAST TECHNIQUE: Multidetector CT imaging of the abdomen and pelvis was performed using the standard protocol following bolus administration of intravenous contrast. CONTRAST:  170mL ISOVUE-300 IOPAMIDOL (ISOVUE-300) INJECTION 61% COMPARISON:  None. FINDINGS: Lower chest: Minor lung base scarring.  No acute findings. Hepatobiliary: No focal liver abnormality is seen. No gallstones, gallbladder wall thickening, or biliary dilatation. Pancreas: Unremarkable. No pancreatic ductal dilatation or surrounding inflammatory changes. Spleen: Normal in size without focal abnormality. Adrenals/Urinary Tract: No adrenal masses. 5 mm low-density mass, midpole left kidney, consistent with a cyst. No other renal masses. There is decreased enhancement of the mid to upper pole the right kidney in the posterior aspect of the lower pole. This appears vascular in etiology likely due to chronic atherosclerotic vessel narrowing. No intrarenal stones. No hydronephrosis. Ureters are normal in course and in caliber. Bladder is unremarkable. Stomach/Bowel: No evidence of bowel injury. Stomach is unremarkable. Bowel is normal in caliber. No wall thickening or inflammation. There are numerous left colon diverticula mostly along the sigmoid. Normal appendix visualized. Vascular/Lymphatic: Dense aortic atherosclerosis and mild ectasia. Maximum AP diameter of 2.2 cm. No pathologically enlarged lymph nodes. Reproductive: Right posterior subserosal uterine fibroid measuring 4.9 cm. No adnexal masses. Other: No abdominal wall hernia.  No ascites. Musculoskeletal: There is a moderate to severe compression fracture of T12 with mild depression of the upper endplate of C37. Both of these findings are new from a chest CT dated 10/08/2016. There is mild  haziness in the perivertebral fat adjacent to the T12 vertebra consistent with edema. No other fractures.  No osteoblastic or osteolytic lesions. IMPRESSION: 1. Moderate to severe compression fracture of T12 with a mild compression fracture of T11. These findings are new since the prior CT consistent with the reported fall last week. 2. No other acute or recent abnormalities. 3. Decreased enhancement of portions of the right kidney consistent with significant renal vascular disease. Consider nonemergent follow-up renal CTA for further assessment. 4. Extensive aortic atherosclerosis.  5. 4.9 cm uterine fibroid. 6. Numerous colonic diverticula without evidence of diverticulitis. Electronically Signed   By: Lajean Manes M.D.   On: 08/24/2017 20:40    Procedures Procedures (including critical care time)  Medications Ordered in ED Medications  iopamidol (ISOVUE-300) 61 % injection (has no administration in time range)  iopamidol (ISOVUE-300) 61 % injection 100 mL (100 mLs Intravenous Contrast Given 08/24/17 2015)  HYDROcodone-acetaminophen (NORCO/VICODIN) 5-325 MG per tablet 1 tablet (1 tablet Oral Given 08/24/17 2059)     Initial Impression / Assessment and Plan / ED Course  I have reviewed the triage vital signs and the nursing notes.  Pertinent labs & imaging results that were available during my care of the patient were reviewed by me and considered in my medical decision making (see chart for details).     CT does not show any acute abdominal pathology.  Her abdominal pain is probably muscular related to the fracture seen from her fall.  No neurologic deficits.  Her generalized fatigue and weakness is probably downtrending sodium leading to the hyponatremia seen on labs today.  She does not appear overtly dehydrated or fluid overloaded.  Given this appears to be symptomatic, though not severe with confusion or seizures, she will need admission and correction.  Dr. Blaine Hamper to admit.  Final Clinical  Impressions(s) / ED Diagnoses   Final diagnoses:  Hyponatremia  T12 compression fracture [facility])    ED Discharge Orders    None       Sherwood Gambler, MD 08/24/17 2109

## 2017-08-25 ENCOUNTER — Other Ambulatory Visit: Payer: Self-pay

## 2017-08-25 DIAGNOSIS — E871 Hypo-osmolality and hyponatremia: Secondary | ICD-10-CM | POA: Diagnosis not present

## 2017-08-25 LAB — BASIC METABOLIC PANEL
ANION GAP: 12 (ref 5–15)
BUN: 12 mg/dL (ref 8–23)
CALCIUM: 9 mg/dL (ref 8.9–10.3)
CO2: 31 mmol/L (ref 22–32)
Chloride: 80 mmol/L — ABNORMAL LOW (ref 98–111)
Creatinine, Ser: 0.73 mg/dL (ref 0.44–1.00)
GLUCOSE: 105 mg/dL — AB (ref 70–99)
Potassium: 3.6 mmol/L (ref 3.5–5.1)
SODIUM: 123 mmol/L — AB (ref 135–145)

## 2017-08-25 LAB — CBC
HCT: 28.1 % — ABNORMAL LOW (ref 36.0–46.0)
Hemoglobin: 8.7 g/dL — ABNORMAL LOW (ref 12.0–15.0)
MCH: 23.3 pg — AB (ref 26.0–34.0)
MCHC: 31 g/dL (ref 30.0–36.0)
MCV: 75.1 fL — ABNORMAL LOW (ref 78.0–100.0)
PLATELETS: 221 10*3/uL (ref 150–400)
RBC: 3.74 MIL/uL — AB (ref 3.87–5.11)
RDW: 17.3 % — ABNORMAL HIGH (ref 11.5–15.5)
WBC: 11.3 10*3/uL — AB (ref 4.0–10.5)

## 2017-08-25 LAB — MAGNESIUM: MAGNESIUM: 1.2 mg/dL — AB (ref 1.7–2.4)

## 2017-08-25 LAB — OSMOLALITY, URINE: OSMOLALITY UR: 554 mosm/kg (ref 300–900)

## 2017-08-25 LAB — TSH: TSH: 1.228 u[IU]/mL (ref 0.350–4.500)

## 2017-08-25 LAB — CREATININE, URINE, RANDOM: Creatinine, Urine: 154.69 mg/dL

## 2017-08-25 LAB — SODIUM, URINE, RANDOM: SODIUM UR: 26 mmol/L

## 2017-08-25 LAB — GLUCOSE, CAPILLARY
GLUCOSE-CAPILLARY: 125 mg/dL — AB (ref 70–99)
Glucose-Capillary: 127 mg/dL — ABNORMAL HIGH (ref 70–99)
Glucose-Capillary: 128 mg/dL — ABNORMAL HIGH (ref 70–99)

## 2017-08-25 MED ORDER — SODIUM CHLORIDE 1 G PO TABS
1.0000 g | ORAL_TABLET | Freq: Three times a day (TID) | ORAL | Status: DC
  Administered 2017-08-25 – 2017-08-27 (×7): 1 g via ORAL
  Filled 2017-08-25 (×10): qty 1

## 2017-08-25 MED ORDER — MAGNESIUM SULFATE 2 GM/50ML IV SOLN
2.0000 g | Freq: Once | INTRAVENOUS | Status: AC
  Administered 2017-08-25: 2 g via INTRAVENOUS
  Filled 2017-08-25: qty 50

## 2017-08-25 MED ORDER — IPRATROPIUM-ALBUTEROL 0.5-2.5 (3) MG/3ML IN SOLN
3.0000 mL | Freq: Four times a day (QID) | RESPIRATORY_TRACT | Status: DC
  Administered 2017-08-25 – 2017-08-27 (×7): 3 mL via RESPIRATORY_TRACT
  Filled 2017-08-25 (×7): qty 3

## 2017-08-25 NOTE — Evaluation (Signed)
Occupational Therapy Evaluation Patient Details Name: [name] MRN: [id number] DOB: [birth year] Today's Date: 08/25/2017    History of Present Illness Pt is a 72 y.o. female with medical history significant of hypertension, hyperlipidemia, diabetes mellitus, COPD on 2 L nasal cannula oxygen, GERD, depression, CHF, tobacco abuse, and alcohol use. She presented to the ED with generalized weakness, fatigue, bilateral back pain and fall. CT revealed T11/12 comp fxs.    Clinical Impression   Pt with decline in function and safety with ADLs and ADL mobility with decreased strength, balance and endurance. Pt fatigues very easily with minimal exertion with O2 SATs dropping to 85% during mobility and toileting tasks requiring O2 increase to 3 L. Pt required increased time, effort and multiple rest breaks to complete all tasks. Initiated energy conservation training with pt. PTA, pt reports that she wa sat home independent with ADLs and and home mgt and on 2 L continuous O2, used RW occassionally. Pt would benefit from acute OT services to address impairments to maximize level of function and safety    Follow Up Recommendations  Home health OT;Supervision - Intermittent    Equipment Recommendations  3 in 1 bedside commode;Tub/shower bench;Other (comment)(ADL A/E)    Recommendations for Other Services       Precautions / Restrictions Precautions Precautions: Fall;Other (comment) Precaution Comments: watch sats Restrictions Weight Bearing Restrictions: No      Mobility Bed Mobility Overal bed mobility: Needs Assistance Bed Mobility: Supine to Sit;Sit to Supine     Supine to sit: Min guard;HOB elevated Sit to supine: Min guard;HOB elevated   General bed mobility comments: +rail, min guard for safety, increased time and effort  Transfers Overall transfer level: Needs assistance Equipment used: Rolling walker (2 wheeled) Transfers: Sit to/from Stand Sit to Stand: Min guard         General transfer comment: increased time to stabilize initial standing balance    Balance Overall balance assessment: Needs assistance Sitting-balance support: Feet supported Sitting balance-Leahy Scale: Good     Standing balance support: Bilateral upper extremity supported;During functional activity Standing balance-Leahy Scale: Poor                             ADL either performed or assessed with clinical judgement   ADL Overall ADL's : Needs assistance/impaired Eating/Feeding: Independent;Sitting   Grooming: Wash/dry hands;Wash/dry face;Set up;Sitting;Min guard   Upper Body Bathing: Min guard;Sitting   Lower Body Bathing: Maximal assistance   Upper Body Dressing : Min guard;Sitting   Lower Body Dressing: Maximal assistance   Toilet Transfer: Min guard;Stand-pivot;BSC   Toileting- Clothing Manipulation and Hygiene: Min guard;Sitting/lateral lean       Functional mobility during ADLs: Min guard General ADL Comments: pt with Poor endurance and fatigues easily on with minimal exertion     Vision Baseline Vision/History: Wears glasses Wears Glasses: Reading only Patient Visual Report: No change from baseline       Perception     Praxis      Pertinent Vitals/Pain Pain Assessment: Faces Faces Pain Scale: Hurts a little bit Pain Location: back  Pain Descriptors / Indicators: Guarding;Grimacing Pain Intervention(s): Monitored during session;Repositioned     Hand Dominance Right   Extremity/Trunk Assessment Upper Extremity Assessment Upper Extremity Assessment: Generalized weakness   Lower Extremity Assessment Lower Extremity Assessment: Defer to PT evaluation   Cervical / Trunk Assessment Cervical / Trunk Assessment: Kyphotic   Communication Communication Communication: No difficulties  Cognition Arousal/Alertness: Awake/alert Behavior During Therapy: WFL for tasks assessed/performed Overall Cognitive Status: Within Functional  Limits for tasks assessed                                 General Comments: decreased awareness of deficits   General Comments       Exercises     Shoulder Instructions      Home Living Family/patient expects to be discharged to:: Private residence Living Arrangements: Alone Available Help at Discharge: Family;Friend(s);Available PRN/intermittently Type of Home: House Home Access: Stairs to enter [employer] of Steps: 3 Entrance Stairs-Rails: Right;Left Home Layout: One level     Bathroom Shower/Tub: Occupational psychologist: Standard     Home Equipment: Environmental consultant - 2 wheels;Shower seat;Other (comment)(home O2)          Prior Functioning/Environment Level of Independence: Independent with assistive device(s)        Comments: occasional use of RW        OT Problem List: Decreased strength;Decreased activity tolerance;Impaired balance (sitting and/or standing);Decreased safety awareness;Decreased knowledge of use of DME or AE;Cardiopulmonary status limiting activity      OT Treatment/Interventions: Self-care/ADL training;Energy conservation;Therapeutic exercise;DME and/or AE instruction;Therapeutic activities;Patient/family education    OT Goals(Current goals can be found in the care plan section) Acute Rehab OT Goals Patient Stated Goal: home OT Goal Formulation: With patient Time For Goal Achievement: 09/08/17 Potential to Achieve Goals: Good ADL Goals Pt Will Perform Grooming: with min guard assist;standing Pt Will Perform Upper Body Bathing: with supervision;with set-up;sitting Pt Will Perform Lower Body Bathing: with mod assist;sitting/lateral leans;sit to/from stand;with adaptive equipment Pt Will Perform Upper Body Dressing: with supervision;with set-up;sitting Pt Will Perform Lower Body Dressing: with max assist;sitting/lateral leans;sit to/from stand;with adaptive equipment Pt Will Transfer to Toilet: with  supervision;with modified independence;ambulating;bedside commode;regular height toilet;grab bars Pt Will Perform Toileting - Clothing Manipulation and hygiene: with supervision;sitting/lateral leans;sit to/from stand Pt Will Perform Tub/Shower Transfer: with min guard assist;with supervision;ambulating;tub bench;3 in 1;grab bars;rolling walker Additional ADL Goal #1: Pt will verbalize 3 EC techniques for ADLs and ADL transfers  OT Frequency: Min 2X/week   Barriers to D/C:    no barriers       Co-evaluation              AM-PAC PT "6 Clicks" Daily Activity     Outcome Measure Help from another person eating meals?: None Help from another person taking care of personal grooming?: A Little Help from another person toileting, which includes using toliet, bedpan, or urinal?: A Lot Help from another person bathing (including washing, rinsing, drying)?: A Lot Help from another person to put on and taking off regular upper body clothing?: A Little Help from another person to put on and taking off regular lower body clothing?: A Lot 6 Click Score: 16   End of Session Equipment Utilized During Treatment: Gait belt;Other (comment)(BSC)  Activity Tolerance: Patient limited by fatigue Patient left: in bed;with call bell/phone within reach  OT Visit Diagnosis: Unsteadiness on feet (R26.81);Muscle weakness (generalized) (M62.81)                Time: 0177-9390 OT Time Calculation (min): 28 min Charges:  OT General Charges $OT Visit: 1 Visit OT Evaluation $OT Eval Moderate Complexity: 1 Mod    Britt Bottom 08/25/2017, 3:12 PM

## 2017-08-25 NOTE — Progress Notes (Signed)
PROGRESS NOTE    NAELLE DIEGEL  IEP:[id number] DOB: 09/19/1945 DOA: 08/24/2017 PCP: Lujean Amel, MD    Brief Narrative:  72 year old with past medical history relevant for hypertension, hyperlipidemia, COPD on 2 L nasal cannula, active tobacco abuse, type 2 diabetes on insulin, active tobacco abuse, anxiety/depression, who was admitted for generalized weakness and back pain and found to have significant hyponatremia.   Assessment & Plan:   Principal Problem:   Hyponatremia Active Problems:   Essential hypertension   COPD exacerbation (HCC)   Smoker   Dyslipidemia associated with type 2 diabetes mellitus (HCC)   Diabetes mellitus, type II, insulin dependent ([place])   GERD without esophagitis   Bilateral back pain   Compression fracture of thoracic vertebra (HCC)   Hypokalemia   Fall   Microcytic anemia   Alcohol use   Depression   Hypomagnesemia   #) Chronic hyponatremia: Patient reports a history of hyponatremia that she cannot tell me the number.  She does not have any evidence of acute hyponatremia and does not appear to be overly symptomatic.  She is not responded well to IV fluid challenge.  Certainly her thiazide like diuretic could be playing a role.  Additionally she is on SSRIs which are also likely contributing.  Undoubtedly her lung disease is not helping much. -Start sodium tablets 1 g 3 times daily -Urine creatinine, sodium, osmolality pending -Serum osmolality suggests hypoosmolar hyponatremia  -TSH normal - Hold HCTZ, will continue SSRIs for now  #) Back pain/compression fracture T11 and T12: Likely in the setting of fall.  No evidence of neurological impairment at this time. -Pain control -PT consult  #) COPD on 2 L: No formal PFTs in the chart - Continue PRN short-acting bronchodilators -Continue ICS/LABA  #) Type 2 diabetes on insulin: -Hold home sitagliptin-metformin 50-1000 twice daily -Hold home exenatide 10 mcg twice daily subcu - Sliding  scale insulin  #) Hypertension/hyperlipidemia: -Hold home HCTZ - Continue amlodipine 5 mg daily -Continue valsartan 160 mg daily - Continue atorvastatin 20 mg daily  #) pain/psych: -Continue paroxetine 30 mg daily -Continue diazepam 2.5 mg twice daily  #) Alcohol abuse: Patient denies any alcohol abuse however she does drink a significant amount. - CIWA protocol -Continue thiamine and folate supplementation  Fluids: Tolerating p.o. Electrodes: Monitor and supplement Nutrition: Carb/heart healthy diet  Prophylaxis: Enoxaparin  Disposition: Pending stabilization of sodium  Full code  Consultants:   None  Procedures:   None  Antimicrobials:   None   Subjective: Patient reports that her lower back pain is improved dramatically.  She is quite eager to go home.  She denies any nausea, vomiting, diarrhea, cough, congestion, rhinorrhea.  She denies any numbness or tingling of her lower extremities.  She denies any lower extremity weakness.  Objective: Vitals:   08/25/17 0500 08/25/17 0601 08/25/17 0814 08/25/17 1002  BP: 138/76 (!) 134/54  (!) 131/42  Pulse: 80 79  69  Resp: 15 18  18   Temp:  (!) 97.5 F (36.4 C)  98.1 F (36.7 C)  TempSrc:  Oral  Oral  SpO2:  94% 93% 96%    Intake/Output Summary (Last 24 hours) at 08/25/2017 1113 Last data filed at 08/25/2017 0900 Gross per 24 hour  Intake 908.7 ml  Output -  Net 908.7 ml   There were no vitals filed for this visit.  Examination:  General exam: Appears calm and comfortable  Respiratory system: No increased work of breathing, scattered wheezes, prolonged expiratory phase, no rhonchi  or crackles Cardiovascular system: Distant heart sounds, regular rate and rhythm, no murmurs Gastrointestinal system: Abdomen is nondistended, soft and nontender. No organomegaly or masses felt. Normal bowel sounds heard. Central nervous system: Alert and oriented. No focal neurological deficits.  Midline tenderness over lumbar  spine Extremities: No lower extremity edema. Skin: Excoriations noted over the bilateral arms Psychiatry: Judgement and insight appear normal. Mood & affect appropriate.     Data Reviewed: I have personally reviewed following labs and imaging studies  CBC: Recent Labs  Lab 08/24/17 1415 08/25/17 0130  WBC 10.9* 11.3*  HGB 9.0* 8.7*  HCT 28.7* 28.1*  MCV 74.4* 75.1*  PLT 252 353   Basic Metabolic Panel: Recent Labs  Lab 08/24/17 1415 08/24/17 2151 08/25/17 0312  NA 124*  --  123*  K 3.4*  --  3.6  CL 77*  --  80*  CO2 32  --  31  GLUCOSE 83  --  105*  BUN 12  --  12  CREATININE 0.76  --  0.73  CALCIUM 9.5  --  9.0  MG  --  1.1* 1.2*  PHOS  --  3.3  --    GFR: CrCl cannot be calculated (Unknown ideal weight.). Liver Function Tests: Recent Labs  Lab 08/24/17 1415  AST 20  ALT 16  ALKPHOS 63  BILITOT 0.9  PROT 6.2*  ALBUMIN 3.5   Recent Labs  Lab 08/24/17 1415  LIPASE 36   No results for input(s): AMMONIA in the last 168 hours. Coagulation Profile: No results for input(s): INR, PROTIME in the last 168 hours. Cardiac Enzymes: No results for input(s): CKTOTAL, CKMB, CKMBINDEX, TROPONINI in the last 168 hours. BNP (last 3 results) No results for input(s): PROBNP in the last 8760 hours. HbA1C: No results for input(s): HGBA1C in the last 72 hours. CBG: Recent Labs  Lab 08/24/17 2129 08/25/17 0752  GLUCAP 113* 127*   Lipid Profile: No results for input(s): CHOL, HDL, LDLCALC, TRIG, CHOLHDL, LDLDIRECT in the last 72 hours. Thyroid Function Tests: Recent Labs    08/25/17 0349  TSH 1.228   Anemia Panel: Recent Labs    08/24/17 2151  VITAMINB12 537  FOLATE 34.0  FERRITIN 13  TIBC 349  IRON 16*  RETICCTPCT 2.7   Sepsis Labs: No results for input(s): PROCALCITON, LATICACIDVEN in the last 168 hours.  No results found for this or any previous visit (from the past 240 hour(s)).       Radiology Studies: Ct Abdomen Pelvis W  Contrast  Result Date: 08/24/2017 CLINICAL DATA:  Fall last week. Patient now bilateral flank pain and nausea. EXAM: CT ABDOMEN AND PELVIS WITH CONTRAST TECHNIQUE: Multidetector CT imaging of the abdomen and pelvis was performed using the standard protocol following bolus administration of intravenous contrast. CONTRAST:  140mL ISOVUE-300 IOPAMIDOL (ISOVUE-300) INJECTION 61% COMPARISON:  None. FINDINGS: Lower chest: Minor lung base scarring.  No acute findings. Hepatobiliary: No focal liver abnormality is seen. No gallstones, gallbladder wall thickening, or biliary dilatation. Pancreas: Unremarkable. No pancreatic ductal dilatation or surrounding inflammatory changes. Spleen: Normal in size without focal abnormality. Adrenals/Urinary Tract: No adrenal masses. 5 mm low-density mass, midpole left kidney, consistent with a cyst. No other renal masses. There is decreased enhancement of the mid to upper pole the right kidney in the posterior aspect of the lower pole. This appears vascular in etiology likely due to chronic atherosclerotic vessel narrowing. No intrarenal stones. No hydronephrosis. Ureters are normal in course and in caliber. Bladder is unremarkable.  Stomach/Bowel: No evidence of bowel injury. Stomach is unremarkable. Bowel is normal in caliber. No wall thickening or inflammation. There are numerous left colon diverticula mostly along the sigmoid. Normal appendix visualized. Vascular/Lymphatic: Dense aortic atherosclerosis and mild ectasia. Maximum AP diameter of 2.2 cm. No pathologically enlarged lymph nodes. Reproductive: Right posterior subserosal uterine fibroid measuring 4.9 cm. No adnexal masses. Other: No abdominal wall hernia.  No ascites. Musculoskeletal: There is a moderate to severe compression fracture of T12 with mild depression of the upper endplate of O32. Both of these findings are new from a chest CT dated 10/08/2016. There is mild haziness in the perivertebral fat adjacent to the T12  vertebra consistent with edema. No other fractures.  No osteoblastic or osteolytic lesions. IMPRESSION: 1. Moderate to severe compression fracture of T12 with a mild compression fracture of T11. These findings are new since the prior CT consistent with the reported fall last week. 2. No other acute or recent abnormalities. 3. Decreased enhancement of portions of the right kidney consistent with significant renal vascular disease. Consider nonemergent follow-up renal CTA for further assessment. 4. Extensive aortic atherosclerosis. 5. 4.9 cm uterine fibroid. 6. Numerous colonic diverticula without evidence of diverticulitis. Electronically Signed   By: Lajean Manes M.D.   On: 08/24/2017 20:40        Scheduled Meds: . amLODipine  10 mg Oral Daily  . aspirin EC  81 mg Oral Daily  . atorvastatin  20 mg Oral QPM  . enoxaparin (LOVENOX) injection  40 mg Subcutaneous Daily  . folic acid  1 mg Oral Daily  . insulin aspart  0-5 Units Subcutaneous QHS  . insulin aspart  0-9 Units Subcutaneous TID WC  . ipratropium-albuterol  3 mL Nebulization Q4H  . LORazepam  0-4 mg Intravenous Q6H   Followed by  . [START ON 08/26/2017] LORazepam  0-4 mg Intravenous Q12H  . mometasone-formoterol  2 puff Inhalation BID  . multivitamin with minerals  1 tablet Oral Daily  . nicotine  21 mg Transdermal Daily  . pantoprazole  40 mg Oral Daily  . PARoxetine  30 mg Oral Daily  . sodium chloride  1 g Oral TID WC  . thiamine  100 mg Oral Daily   Or  . thiamine  100 mg Intravenous Daily   Continuous Infusions:   LOS: 0 days    Time spent: [place], MD [facility]  If 7PM-7AM, please contact night-coverage [email or web address] Password [email or web address] 08/25/2017, 11:13 AM

## 2017-08-25 NOTE — Evaluation (Signed)
Physical Therapy Evaluation Patient Details Name: [name] MRN: [id number] DOB: Sep 10, 1945 Today's Date: 08/25/2017   History of Present Illness  Pt is a 72 y.o. female with medical history significant of hypertension, hyperlipidemia, diabetes mellitus, COPD on 2 L nasal cannula oxygen, GERD, depression, CHF, tobacco abuse, and alcohol use. She presented to the ED with generalized weakness, fatigue, bilateral back pain and fall. CT revealed T11/12 comp fxs.     Clinical Impression  Pt admitted with above diagnosis. Pt currently with functional limitations due to the deficits listed below (see PT Problem List). On eval, pt required min guard assist bed mobility, min guard assist transfers and min guard assist ambulation 10 feet x 2 with RW. Pt declined hallway ambulation. She ambulated in room on RW with desat to 80%. O2 increased to 93% on 2 L O2 in bed.  Pt will benefit from skilled PT to increase their independence and safety with mobility to allow discharge to the venue listed below.       Follow Up Recommendations Home health PT;Supervision for mobility/OOB    Equipment Recommendations  None recommended by PT    Recommendations for Other Services       Precautions / Restrictions Precautions Precautions: Fall;Other (comment) Precaution Comments: watch sats      Mobility  Bed Mobility Overal bed mobility: Needs Assistance Bed Mobility: Supine to Sit;Sit to Supine     Supine to sit: Min guard;HOB elevated Sit to supine: Min guard;HOB elevated   General bed mobility comments: +rail, min guard for safety, increased time and effort  Transfers Overall transfer level: Needs assistance Equipment used: Rolling walker (2 wheeled) Transfers: Sit to/from Stand Sit to Stand: Min guard         General transfer comment: increased time to stabilize initial standing balance  Ambulation/Gait Ambulation/Gait assistance: Min guard Gait Distance (Feet): 10 Feet(x  2) Assistive device: Rolling walker (2 wheeled) Gait Pattern/deviations: Step-through pattern;Decreased stride length Gait velocity: decreased Gait velocity interpretation: <1.31 ft/sec, indicative of household ambulator General Gait Details: slow, guarded gait. Pt ambulated on RW with desat to 80%. Returned to 2 L O2 in bed with 3-minute recovery to 93%. Pt reports she only wears her O2 PRN at home.   Stairs            Wheelchair Mobility    Modified Rankin (Stroke Patients Only)       Balance Overall balance assessment: Needs assistance Sitting-balance support: Feet supported Sitting balance-Leahy Scale: Good     Standing balance support: Bilateral upper extremity supported;During functional activity Standing balance-Leahy Scale: Poor Standing balance comment: heavy reliance on RW                             Pertinent Vitals/Pain Pain Assessment: Faces Faces Pain Scale: Hurts little more Pain Location: back  Pain Descriptors / Indicators: Guarding;Grimacing Pain Intervention(s): Limited activity within patient's tolerance;Monitored during session;Repositioned    Home Living Family/patient expects to be discharged to:: Private residence Living Arrangements: Alone Available Help at Discharge: Family;Friend(s);Available PRN/intermittently Type of Home: House Home Access: Stairs to enter Entrance Stairs-Rails: Psychiatric nurse of Steps: 3 Home Layout: One level Home Equipment: Walker - 2 wheels;Shower seat;Other (comment)(home O2)      Prior Function Level of Independence: Independent with assistive device(s)         Comments: occasional use of RW     Hand Dominance  Extremity/Trunk Assessment   Upper Extremity Assessment Upper Extremity Assessment: Defer to OT evaluation    Lower Extremity Assessment Lower Extremity Assessment: Generalized weakness    Cervical / Trunk Assessment Cervical / Trunk Assessment:  Kyphotic  Communication   Communication: No difficulties  Cognition Arousal/Alertness: Awake/alert Behavior During Therapy: WFL for tasks assessed/performed Overall Cognitive Status: Within Functional Limits for tasks assessed                                 General Comments: decreased awareness of deficits      General Comments      Exercises     Assessment/Plan    PT Assessment Patient needs continued PT services  PT Problem List Decreased strength;Decreased mobility;Decreased safety awareness;Decreased activity tolerance;Cardiopulmonary status limiting activity;Pain;Decreased balance       PT Treatment Interventions Therapeutic activities;Gait training;Therapeutic exercise;Patient/family education;Stair training;Balance training;Functional mobility training    PT Goals (Current goals can be found in the Care Plan section)  Acute Rehab PT Goals Patient Stated Goal: home PT Goal Formulation: With patient Time For Goal Achievement: 09/08/17 Potential to Achieve Goals: Good    Frequency Min 3X/week   Barriers to discharge        Co-evaluation               AM-PAC PT "6 Clicks" Daily Activity  Outcome Measure Difficulty turning over in bed (including adjusting bedclothes, sheets and blankets)?: A Lot Difficulty moving from lying on back to sitting on the side of the bed? : A Lot Difficulty sitting down on and standing up from a chair with arms (e.g., wheelchair, bedside commode, etc,.)?: A Little Help needed moving to and from a bed to chair (including a wheelchair)?: A Little Help needed walking in hospital room?: A Little Help needed climbing 3-5 steps with a railing? : A Lot 6 Click Score: 15    End of Session Equipment Utilized During Treatment: Gait belt;Oxygen Activity Tolerance: Patient limited by fatigue Patient left: in bed;with call bell/phone within reach;with bed alarm set Nurse Communication: Mobility status PT Visit Diagnosis:  Other abnormalities of gait and mobility (R26.89);Pain;Muscle weakness (generalized) (M62.81)    Time: 2248-2500 PT Time Calculation (min) (ACUTE ONLY): 16 min   Charges:   PT Evaluation $PT Eval Moderate Complexity: 1 Mod          Lorrin Goodell, PT  Office # [phone] Pager [phone]   Lorriane Shire 08/25/2017, 10:45 AM

## 2017-08-25 NOTE — ED Notes (Signed)
Report given to Nikki RN

## 2017-08-26 DIAGNOSIS — E860 Dehydration: Secondary | ICD-10-CM | POA: Diagnosis present

## 2017-08-26 DIAGNOSIS — M549 Dorsalgia, unspecified: Secondary | ICD-10-CM | POA: Diagnosis not present

## 2017-08-26 DIAGNOSIS — E222 Syndrome of inappropriate secretion of antidiuretic hormone: Secondary | ICD-10-CM | POA: Diagnosis not present

## 2017-08-26 DIAGNOSIS — E785 Hyperlipidemia, unspecified: Secondary | ICD-10-CM | POA: Diagnosis present

## 2017-08-26 DIAGNOSIS — J9611 Chronic respiratory failure with hypoxia: Secondary | ICD-10-CM | POA: Diagnosis not present

## 2017-08-26 DIAGNOSIS — D509 Iron deficiency anemia, unspecified: Secondary | ICD-10-CM | POA: Diagnosis present

## 2017-08-26 DIAGNOSIS — R531 Weakness: Secondary | ICD-10-CM | POA: Diagnosis present

## 2017-08-26 DIAGNOSIS — E876 Hypokalemia: Secondary | ICD-10-CM | POA: Diagnosis not present

## 2017-08-26 DIAGNOSIS — Y909 Presence of alcohol in blood, level not specified: Secondary | ICD-10-CM | POA: Diagnosis present

## 2017-08-26 DIAGNOSIS — F10230 Alcohol dependence with withdrawal, uncomplicated: Secondary | ICD-10-CM | POA: Diagnosis present

## 2017-08-26 DIAGNOSIS — W19XXXA Unspecified fall, initial encounter: Secondary | ICD-10-CM | POA: Diagnosis present

## 2017-08-26 DIAGNOSIS — K219 Gastro-esophageal reflux disease without esophagitis: Secondary | ICD-10-CM | POA: Diagnosis present

## 2017-08-26 DIAGNOSIS — I700 Atherosclerosis of aorta: Secondary | ICD-10-CM | POA: Diagnosis present

## 2017-08-26 DIAGNOSIS — R69 Illness, unspecified: Secondary | ICD-10-CM | POA: Diagnosis not present

## 2017-08-26 DIAGNOSIS — Z9981 Dependence on supplemental oxygen: Secondary | ICD-10-CM | POA: Diagnosis not present

## 2017-08-26 DIAGNOSIS — J441 Chronic obstructive pulmonary disease with (acute) exacerbation: Secondary | ICD-10-CM | POA: Diagnosis not present

## 2017-08-26 DIAGNOSIS — M4854XA Collapsed vertebra, not elsewhere classified, thoracic region, initial encounter for fracture: Secondary | ICD-10-CM | POA: Diagnosis not present

## 2017-08-26 DIAGNOSIS — D259 Leiomyoma of uterus, unspecified: Secondary | ICD-10-CM | POA: Diagnosis present

## 2017-08-26 DIAGNOSIS — K5730 Diverticulosis of large intestine without perforation or abscess without bleeding: Secondary | ICD-10-CM | POA: Diagnosis present

## 2017-08-26 DIAGNOSIS — J449 Chronic obstructive pulmonary disease, unspecified: Secondary | ICD-10-CM | POA: Diagnosis not present

## 2017-08-26 DIAGNOSIS — E871 Hypo-osmolality and hyponatremia: Secondary | ICD-10-CM | POA: Diagnosis not present

## 2017-08-26 DIAGNOSIS — R109 Unspecified abdominal pain: Secondary | ICD-10-CM | POA: Diagnosis not present

## 2017-08-26 DIAGNOSIS — Z79899 Other long term (current) drug therapy: Secondary | ICD-10-CM | POA: Diagnosis not present

## 2017-08-26 DIAGNOSIS — T502X5A Adverse effect of carbonic-anhydrase inhibitors, benzothiadiazides and other diuretics, initial encounter: Secondary | ICD-10-CM | POA: Diagnosis present

## 2017-08-26 DIAGNOSIS — Z794 Long term (current) use of insulin: Secondary | ICD-10-CM | POA: Diagnosis not present

## 2017-08-26 DIAGNOSIS — F329 Major depressive disorder, single episode, unspecified: Secondary | ICD-10-CM | POA: Diagnosis present

## 2017-08-26 DIAGNOSIS — E119 Type 2 diabetes mellitus without complications: Secondary | ICD-10-CM | POA: Diagnosis present

## 2017-08-26 LAB — CBC
HCT: 24.9 % — ABNORMAL LOW (ref 36.0–46.0)
Hemoglobin: 7.8 g/dL — ABNORMAL LOW (ref 12.0–15.0)
MCH: 23.5 pg — ABNORMAL LOW (ref 26.0–34.0)
MCHC: 31.3 g/dL (ref 30.0–36.0)
MCV: 75 fL — ABNORMAL LOW (ref 78.0–100.0)
Platelets: 208 10*3/uL (ref 150–400)
RBC: 3.32 MIL/uL — ABNORMAL LOW (ref 3.87–5.11)
RDW: 17.3 % — ABNORMAL HIGH (ref 11.5–15.5)
WBC: 7.8 K/uL (ref 4.0–10.5)

## 2017-08-26 LAB — BASIC METABOLIC PANEL
BUN: 8 mg/dL (ref 8–23)
Calcium: 8.8 mg/dL — ABNORMAL LOW (ref 8.9–10.3)
Creatinine, Ser: 0.64 mg/dL (ref 0.44–1.00)
GFR calc Af Amer: >60 mL/min (ref >60)
GFR calc non Af Amer: >60 mL/min (ref >60)
Sodium: 124 mmol/L — ABNORMAL LOW (ref 135–145)

## 2017-08-26 LAB — MAGNESIUM: Magnesium: 1.3 mg/dL — ABNORMAL LOW (ref 1.7–2.4)

## 2017-08-26 LAB — BASIC METABOLIC PANEL WITH GFR
Anion gap: 12 (ref 5–15)
CO2: 32 mmol/L (ref 22–32)
Chloride: 80 mmol/L — ABNORMAL LOW (ref 98–111)
Glucose, Bld: 107 mg/dL — ABNORMAL HIGH (ref 70–99)
Potassium: 3.3 mmol/L — ABNORMAL LOW (ref 3.5–5.1)

## 2017-08-26 LAB — GLUCOSE, CAPILLARY
GLUCOSE-CAPILLARY: 102 mg/dL — AB (ref 70–99)
GLUCOSE-CAPILLARY: 117 mg/dL — AB (ref 70–99)
GLUCOSE-CAPILLARY: 101 mg/dL — AB (ref 70–99)
GLUCOSE-CAPILLARY: 116 mg/dL — AB (ref 70–99)
Glucose-Capillary: 118 mg/dL — ABNORMAL HIGH (ref 70–99)

## 2017-08-26 LAB — SODIUM: Sodium: 127 mmol/L — ABNORMAL LOW (ref 135–145)

## 2017-08-26 MED ORDER — POTASSIUM CHLORIDE CRYS ER 20 MEQ PO TBCR
40.0000 meq | EXTENDED_RELEASE_TABLET | ORAL | Status: AC
  Administered 2017-08-26 (×2): 40 meq via ORAL
  Filled 2017-08-26 (×2): qty 2

## 2017-08-26 MED ORDER — MAGNESIUM SULFATE 50 % IJ SOLN
6.0000 g | Freq: Once | INTRAVENOUS | Status: AC
  Administered 2017-08-26: 6 g via INTRAVENOUS
  Filled 2017-08-26: qty 12

## 2017-08-26 MED ORDER — SODIUM CHLORIDE 0.9 % IV BOLUS
500.0000 mL | Freq: Once | INTRAVENOUS | Status: AC
  Administered 2017-08-26: 500 mL via INTRAVENOUS

## 2017-08-26 NOTE — Progress Notes (Signed)
Occupational Therapy Treatment Patient Details Name: [name] MRN: [id number] DOB: 08-24-1945 Today's Date: 08/26/2017    History of present illness Pt is a 72 y.o. female with medical history significant of hypertension, hyperlipidemia, diabetes mellitus, COPD on 2 L nasal cannula oxygen, GERD, depression, CHF, tobacco abuse, and alcohol use. She presented to the ED with generalized weakness, fatigue, bilateral back pain and fall. CT revealed T11/12 comp fxs.    OT comments  Pt seen for education session in prep for d/c. Handout provided to pt re energy conservation, with discussion re implementing changes in the home for fall prevention and maximizing return to PLOF. Pt received demo and completed teach back re reacher use for LB dressing.    Follow Up Recommendations  Home health OT;Supervision - Intermittent    Equipment Recommendations  3 in 1 bedside commode;Tub/shower bench;Other (comment)    Recommendations for Other Services      Precautions / Restrictions Precautions Precautions: Fall;Other (comment) Precaution Comments: watch sats Restrictions Weight Bearing Restrictions: No       Mobility Bed Mobility   Bed Mobility: Supine to Sit     Supine to sit: Min guard;HOB elevated     General bed mobility comments: +rail, min guard for safety, increased time and effort  Transfers Overall transfer level: Needs assistance Equipment used: Rolling walker (2 wheeled) Transfers: Sit to/from Stand Sit to Stand: Min guard         General transfer comment: min guard for safety, no physical assist    Balance Overall balance assessment: Needs assistance Sitting-balance support: Feet supported;No upper extremity supported Sitting balance-Leahy Scale: Good     Standing balance support: Bilateral upper extremity supported;During functional activity Standing balance-Leahy Scale: Poor Standing balance comment: heavy reliance on RW                            ADL either performed or assessed with clinical judgement   ADL                                               Vision       Perception     Praxis      Cognition Arousal/Alertness: Awake/alert Behavior During Therapy: WFL for tasks assessed/performed Overall Cognitive Status: Within Functional Limits for tasks assessed                                 General Comments: decreased awareness of deficits        Exercises     Shoulder Instructions       General Comments      Pertinent Vitals/ Pain       Pain Assessment: No/denies pain Pain Score: 7  Pain Location: back  Pain Descriptors / Indicators: Sore Pain Intervention(s): Monitored during session;Repositioned  Home Living                                          Prior Functioning/Environment              Frequency  Min 2X/week        Progress Toward Goals  OT Goals(current goals can now be  found in the care plan section)  Progress towards OT goals: Progressing toward goals  Acute Rehab OT Goals Patient Stated Goal: home  Plan Discharge plan remains appropriate    Co-evaluation                 AM-PAC PT "6 Clicks" Daily Activity     Outcome Measure   Help from another person eating meals?: None Help from another person taking care of personal grooming?: A Little Help from another person toileting, which includes using toliet, bedpan, or urinal?: A Lot Help from another person bathing (including washing, rinsing, drying)?: A Lot Help from another person to put on and taking off regular upper body clothing?: A Little Help from another person to put on and taking off regular lower body clothing?: A Lot 6 Click Score: 16    End of Session    OT Visit Diagnosis: Unsteadiness on feet (R26.81);Muscle weakness (generalized) (M62.81)   Activity Tolerance Patient tolerated treatment well   Patient Left in bed;with call bell/phone  within reach   Nurse Communication          Time: 0301-3143 OT Time Calculation (min): 17 min  Charges: OT General Charges $OT Visit: 1 Visit OT Treatments $Self Care/Home Management : 8-22 mins  Curtis Sites OTR/L 08/26/2017, 3:37 PM

## 2017-08-26 NOTE — Progress Notes (Signed)
Physical Therapy Treatment Patient Details Name: [name] MRN: [id number] DOB: 15-Feb-1945 Today's Date: 08/26/2017    History of Present Illness Pt is a 72 y.o. female with medical history significant of hypertension, hyperlipidemia, diabetes mellitus, COPD on 2 L nasal cannula oxygen, GERD, depression, CHF, tobacco abuse, and alcohol use. She presented to the ED with generalized weakness, fatigue, bilateral back pain and fall. CT revealed T11/12 comp fxs.     PT Comments    Pt progressing well with mobility. Ambulated in hallway 100 feet with RW min guard assist. Pt on 3 L O2 throughout session with SpO2 93% during mobility. Pt positioned in recliner with feet elevated at end of session. Pt reporting 7/10 back pain but declining pain meds. She states they make her feel 'loopy'.     Follow Up Recommendations  Home health PT;Supervision for mobility/OOB     Equipment Recommendations  None recommended by PT    Recommendations for Other Services       Precautions / Restrictions Precautions Precautions: Fall;Other (comment) Precaution Comments: watch sats Restrictions Weight Bearing Restrictions: No    Mobility  Bed Mobility   Bed Mobility: Supine to Sit     Supine to sit: Min guard;HOB elevated     General bed mobility comments: +rail, min guard for safety, increased time and effort  Transfers Overall transfer level: Needs assistance Equipment used: Rolling walker (2 wheeled) Transfers: Sit to/from Stand Sit to Stand: Min guard         General transfer comment: min guard for safety, no physical assist  Ambulation/Gait Ambulation/Gait assistance: Min guard Gait Distance (Feet): 100 Feet Assistive device: Rolling walker (2 wheeled) Gait Pattern/deviations: Step-through pattern;Decreased stride length Gait velocity: decreased Gait velocity interpretation: <1.31 ft/sec, indicative of household ambulator General Gait Details: Pt ambulated on 3 L O2 with  SpO2 93%. Distance limited by c/o dizziness.    Stairs             Wheelchair Mobility    Modified Rankin (Stroke Patients Only)       Balance Overall balance assessment: Needs assistance Sitting-balance support: Feet supported;No upper extremity supported Sitting balance-Leahy Scale: Good     Standing balance support: Bilateral upper extremity supported;During functional activity Standing balance-Leahy Scale: Poor Standing balance comment: heavy reliance on RW                            Cognition Arousal/Alertness: Awake/alert Behavior During Therapy: WFL for tasks assessed/performed Overall Cognitive Status: Within Functional Limits for tasks assessed                                 General Comments: decreased awareness of deficits      Exercises      General Comments        Pertinent Vitals/Pain Pain Assessment: 0-10 Pain Score: 7  Pain Location: back  Pain Descriptors / Indicators: Sore Pain Intervention(s): Monitored during session;Repositioned    Home Living                      Prior Function            PT Goals (current goals can now be found in the care plan section) Acute Rehab PT Goals Patient Stated Goal: home PT Goal Formulation: With patient Time For Goal Achievement: 09/08/17 Potential to Achieve Goals: Good Progress towards PT  goals: Progressing toward goals    Frequency    Min 3X/week      PT Plan Current plan remains appropriate    Co-evaluation              AM-PAC PT "6 Clicks" Daily Activity  Outcome Measure  Difficulty turning over in bed (including adjusting bedclothes, sheets and blankets)?: A Little Difficulty moving from lying on back to sitting on the side of the bed? : A Lot Difficulty sitting down on and standing up from a chair with arms (e.g., wheelchair, bedside commode, etc,.)?: A Little Help needed moving to and from a bed to chair (including a wheelchair)?: A  Little Help needed walking in hospital room?: A Little Help needed climbing 3-5 steps with a railing? : A Lot 6 Click Score: 16    End of Session Equipment Utilized During Treatment: Gait belt;Oxygen Activity Tolerance: Patient tolerated treatment well Patient left: in chair;with call bell/phone within reach Nurse Communication: Mobility status PT Visit Diagnosis: Other abnormalities of gait and mobility (R26.89);Pain;Muscle weakness (generalized) (M62.81)     Time: 2751-7001 PT Time Calculation (min) (ACUTE ONLY): 14 min  Charges:  $Gait Training: 8-22 mins                     Lorrin Goodell, PT  Office # [phone] Pager [phone]    [name] 08/26/2017, 12:28 PM

## 2017-08-26 NOTE — Progress Notes (Addendum)
PROGRESS NOTE    [name]  KNL:[id number] DOB: Jan 16, 1945 DOA: 08/24/2017 PCP: Lujean Amel, MD    Brief Narrative:  72 year old with past medical history relevant for hypertension, hyperlipidemia, COPD on 2 L nasal cannula, active tobacco abuse, type 2 diabetes on insulin, active tobacco abuse, anxiety/depression, who was admitted for generalized weakness and back pain and found to have significant hyponatremia.   Assessment & Plan:   Principal Problem:   Hyponatremia Active Problems:   Essential hypertension   COPD exacerbation (HCC)   Smoker   Dyslipidemia associated with type 2 diabetes mellitus (HCC)   Diabetes mellitus, type II, insulin dependent ([place])   GERD without esophagitis   Bilateral back pain   Compression fracture of thoracic vertebra (HCC)   Hypokalemia   Fall   Microcytic anemia   Alcohol use   Depression   Hypomagnesemia   #) Chronic hyponatremia: Likely multifactorial including alcoholism, SSRI use, HCTZ use, lung disease.  Pain additionally is likely playing a component.  It is not clear how symptomatic the patient is however she is quite lethargic.  Suspect that some degree of the fall she has had at home are secondary to her hyponatremia.  Her last sodium was normal approximately 1 year ago. -Continue t sodium tablets 1 g 3 times daily, fluid restriction - We will give 1 L bolus and recheck sodium later on this afternoon -Urine creatinine, sodium, osmolality all consistent with likely SIADH -Serum osmolality suggests hypoosmolar hyponatremia  -TSH normal - Hold HCTZ, will continue SSRIs for now  #) Back pain/compression fracture T11 and T12: Likely in the setting of fall.  No evidence of neurological impairment at this time. -Pain control -PT consult, recommends home health PT -Occupational therapy recommends 3 and 1 bedside commode; tub/shower bench and home health OT  #) COPD on 2 L: No formal PFTs in the chart - Continue PRN  short-acting bronchodilators -Continue ICS/LABA  #) Type 2 diabetes on insulin: -Hold home sitagliptin-metformin 50-1000 twice daily -Hold home exenatide 10 mcg twice daily subcu - Sliding scale insulin  #) Hypertension/hyperlipidemia: -Hold home HCTZ - Continue amlodipine 5 mg daily -Continue valsartan 160 mg daily - Continue atorvastatin 20 mg daily  #) pain/psych: -Continue paroxetine 30 mg daily -Continue diazepam 2.5 mg twice daily  #) Alcohol abuse: Patient denies any alcohol abuse however she does drink a significant amount.  She is withdrawing currently though not significantly enough to require scheduled benzodiazepines.  She is quite lethargic. - CIWA protocol -Continue thiamine and folate supplementation  Fluids: Tolerating p.o. Electrodes: Monitor and supplement Nutrition: Carb/heart healthy diet  Prophylaxis: Enoxaparin  Disposition: Pending stabilization of sodium  Full code  Consultants:   None  Procedures:   None  Antimicrobials:   None   Subjective: Patient reports that he continues to have low back pain.  She denies any nausea, vomiting, diarrhea, cough, congestion, rhinorrhea.  She does not have any headache.  Objective: Vitals:   08/26/17 0509 08/26/17 0848 08/26/17 0849 08/26/17 0952  BP: 102/62   (!) 149/64  Pulse: 75   88  Resp:    (!) 22  Temp:    97.9 F (36.6 C)  TempSrc:    Oral  SpO2:  92% 92% 97%  Weight:        Intake/Output Summary (Last 24 hours) at 08/26/2017 1003 Last data filed at 08/26/2017 0947 Gross per 24 hour  Intake 400 ml  Output 1575 ml  Net -1175 ml   [employer]  08/26/17 0222  Weight: 82 kg    Examination:  General exam: Appears calm and comfortable  Respiratory system: No increased work of breathing, scattered wheezes, prolonged expiratory phase, no rhonchi or crackles Cardiovascular system: Distant heart sounds, regular rate and rhythm, no murmurs Gastrointestinal system: Abdomen is  nondistended, soft and nontender. No organomegaly or masses felt. Normal bowel sounds heard. Central nervous system: Alert and oriented. No focal neurological deficits.  Midline tenderness over lumbar spine Extremities: No lower extremity edema. Skin: Excoriations noted over the bilateral arms Psychiatry: Judgement and insight appear normal. Mood & affect appropriate.     Data Reviewed: I have personally reviewed following labs and imaging studies  CBC: Recent Labs  Lab 08/24/17 1415 08/25/17 0130 08/26/17 0442  WBC 10.9* 11.3* 7.8  HGB 9.0* 8.7* 7.8*  HCT 28.7* 28.1* 24.9*  MCV 74.4* 75.1* 75.0*  PLT 252 221 213   Basic Metabolic Panel: Recent Labs  Lab 08/24/17 1415 08/24/17 2151 08/25/17 0312 08/26/17 0442  NA 124*  --  123* 124*  K 3.4*  --  3.6 3.3*  CL 77*  --  80* 80*  CO2 32  --  31 32  GLUCOSE 83  --  105* 107*  BUN 12  --  12 8  CREATININE 0.76  --  0.73 0.64  CALCIUM 9.5  --  9.0 8.8*  MG  --  1.1* 1.2* 1.3*  PHOS  --  3.3  --   --    GFR: CrCl cannot be calculated (Unknown ideal weight.). Liver Function Tests: Recent Labs  Lab 08/24/17 1415  AST 20  ALT 16  ALKPHOS 63  BILITOT 0.9  PROT 6.2*  ALBUMIN 3.5   Recent Labs  Lab 08/24/17 1415  LIPASE 36   No results for input(s): AMMONIA in the last 168 hours. Coagulation Profile: No results for input(s): INR, PROTIME in the last 168 hours. Cardiac Enzymes: No results for input(s): CKTOTAL, CKMB, CKMBINDEX, TROPONINI in the last 168 hours. BNP (last 3 results) No results for input(s): PROBNP in the last 8760 hours. HbA1C: No results for input(s): HGBA1C in the last 72 hours. CBG: Recent Labs  Lab 08/25/17 0752 08/25/17 1151 08/25/17 1631 08/25/17 2223 08/26/17 0801  GLUCAP 127* 128* 125* 118* 102*   Lipid Profile: No results for input(s): CHOL, HDL, LDLCALC, TRIG, CHOLHDL, LDLDIRECT in the last 72 hours. Thyroid Function Tests: Recent Labs    08/25/17 0349  TSH 1.228   Anemia  Panel: Recent Labs    08/24/17 2151  VITAMINB12 537  FOLATE 34.0  FERRITIN 13  TIBC 349  IRON 16*  RETICCTPCT 2.7   Sepsis Labs: No results for input(s): PROCALCITON, LATICACIDVEN in the last 168 hours.  No results found for this or any previous visit (from the past 240 hour(s)).       Radiology Studies: Ct Abdomen Pelvis W Contrast  Result Date: 08/24/2017 CLINICAL DATA:  Fall last week. Patient now bilateral flank pain and nausea. EXAM: CT ABDOMEN AND PELVIS WITH CONTRAST TECHNIQUE: Multidetector CT imaging of the abdomen and pelvis was performed using the standard protocol following bolus administration of intravenous contrast. CONTRAST:  132mL ISOVUE-300 IOPAMIDOL (ISOVUE-300) INJECTION 61% COMPARISON:  None. FINDINGS: Lower chest: Minor lung base scarring.  No acute findings. Hepatobiliary: No focal liver abnormality is seen. No gallstones, gallbladder wall thickening, or biliary dilatation. Pancreas: Unremarkable. No pancreatic ductal dilatation or surrounding inflammatory changes. Spleen: Normal in size without focal abnormality. Adrenals/Urinary Tract: No adrenal masses. 5 mm  low-density mass, midpole left kidney, consistent with a cyst. No other renal masses. There is decreased enhancement of the mid to upper pole the right kidney in the posterior aspect of the lower pole. This appears vascular in etiology likely due to chronic atherosclerotic vessel narrowing. No intrarenal stones. No hydronephrosis. Ureters are normal in course and in caliber. Bladder is unremarkable. Stomach/Bowel: No evidence of bowel injury. Stomach is unremarkable. Bowel is normal in caliber. No wall thickening or inflammation. There are numerous left colon diverticula mostly along the sigmoid. Normal appendix visualized. Vascular/Lymphatic: Dense aortic atherosclerosis and mild ectasia. Maximum AP diameter of 2.2 cm. No pathologically enlarged lymph nodes. Reproductive: Right posterior subserosal uterine  fibroid measuring 4.9 cm. No adnexal masses. Other: No abdominal wall hernia.  No ascites. Musculoskeletal: There is a moderate to severe compression fracture of T12 with mild depression of the upper endplate of K48. Both of these findings are new from a chest CT dated 10/08/2016. There is mild haziness in the perivertebral fat adjacent to the T12 vertebra consistent with edema. No other fractures.  No osteoblastic or osteolytic lesions. IMPRESSION: 1. Moderate to severe compression fracture of T12 with a mild compression fracture of T11. These findings are new since the prior CT consistent with the reported fall last week. 2. No other acute or recent abnormalities. 3. Decreased enhancement of portions of the right kidney consistent with significant renal vascular disease. Consider nonemergent follow-up renal CTA for further assessment. 4. Extensive aortic atherosclerosis. 5. 4.9 cm uterine fibroid. 6. Numerous colonic diverticula without evidence of diverticulitis. Electronically Signed   By: Lajean Manes M.D.   On: 08/24/2017 20:40        Scheduled Meds: . amLODipine  10 mg Oral Daily  . aspirin EC  81 mg Oral Daily  . atorvastatin  20 mg Oral QPM  . enoxaparin (LOVENOX) injection  40 mg Subcutaneous Daily  . folic acid  1 mg Oral Daily  . insulin aspart  0-5 Units Subcutaneous QHS  . insulin aspart  0-9 Units Subcutaneous TID WC  . ipratropium-albuterol  3 mL Nebulization QID  . LORazepam  0-4 mg Intravenous Q6H   Followed by  . LORazepam  0-4 mg Intravenous Q12H  . mometasone-formoterol  2 puff Inhalation BID  . multivitamin with minerals  1 tablet Oral Daily  . nicotine  21 mg Transdermal Daily  . pantoprazole  40 mg Oral Daily  . PARoxetine  30 mg Oral Daily  . potassium chloride  40 mEq Oral Q4H  . sodium chloride  1 g Oral TID WC  . thiamine  100 mg Oral Daily   Or  . thiamine  100 mg Intravenous Daily   Continuous Infusions: . magnesium sulfate 1 - 4 g bolus IVPB 6 g  (08/26/17 0834)     LOS: 0 days    Time spent: [place], MD [facility]  If 7PM-7AM, please contact night-coverage [email or web address] Password [facility] 08/26/2017, 10:03 AM

## 2017-08-26 NOTE — Care Management Obs Status (Signed)
[place] NOTIFICATION   Patient Details  Name: [name] MRN: [id number] Date of Birth: 1945/01/10   Medicare Observation Status Notification Given:  Pt would not accept form, however Yes CM reviewed information verbally - pt wanted CM to readdress with her broter POA when he visits today, pt wants him to sign for her)  Update:  CM unable to reach brother - reached out multiple times. Maryclare Labrador, RN 08/26/2017, 3:48 PM

## 2017-08-27 LAB — MAGNESIUM: Magnesium: 1.8 mg/dL (ref 1.7–2.4)

## 2017-08-27 LAB — BASIC METABOLIC PANEL WITH GFR
Calcium: 8.6 mg/dL — ABNORMAL LOW (ref 8.9–10.3)
GFR calc non Af Amer: >60 mL/min (ref >60)
Potassium: 3.8 mmol/L (ref 3.5–5.1)
Sodium: 127 mmol/L — ABNORMAL LOW (ref 135–145)

## 2017-08-27 LAB — CBC
HCT: 26.6 % — ABNORMAL LOW (ref 36.0–46.0)
Hemoglobin: 8 g/dL — ABNORMAL LOW (ref 12.0–15.0)
MCH: 22.9 pg — ABNORMAL LOW (ref 26.0–34.0)
MCHC: 30.1 g/dL (ref 30.0–36.0)
MCV: 76.2 fL — ABNORMAL LOW (ref 78.0–100.0)
Platelets: 232 K/uL (ref 150–400)
RBC: 3.49 MIL/uL — ABNORMAL LOW (ref 3.87–5.11)
RDW: 17.6 % — ABNORMAL HIGH (ref 11.5–15.5)
WBC: 7 10*3/uL (ref 4.0–10.5)

## 2017-08-27 LAB — BASIC METABOLIC PANEL
Anion gap: 7 (ref 5–15)
BUN: 8 mg/dL (ref 8–23)
CO2: 32 mmol/L (ref 22–32)
Chloride: 88 mmol/L — ABNORMAL LOW (ref 98–111)
Creatinine, Ser: 0.58 mg/dL (ref 0.44–1.00)
GFR calc Af Amer: >60 mL/min (ref >60)
Glucose, Bld: 120 mg/dL — ABNORMAL HIGH (ref 70–99)

## 2017-08-27 LAB — GLUCOSE, CAPILLARY: Glucose-Capillary: 115 mg/dL — ABNORMAL HIGH (ref 70–99)

## 2017-08-27 MED ORDER — METHOCARBAMOL 500 MG PO TABS: 500.0000 mg | ORAL_TABLET | Freq: Three times a day (TID) | ORAL | 0 refills | Status: DC | PRN

## 2017-08-27 MED ORDER — OXYCODONE-ACETAMINOPHEN 5-325 MG PO TABS: 1.0000 | ORAL_TABLET | ORAL | 0 refills | Status: AC | PRN

## 2017-08-27 NOTE — Discharge Summary (Signed)
Physician Discharge Summary  [name] CNO:[id number] DOB: May 07, 1945 DOA: 08/24/2017  PCP: Lujean Amel, MD  Admit date: 08/24/2017 Discharge date: 08/27/2017  Admitted From: Home Disposition: Home  Recommendations for Outpatient Follow-up:  1. Follow up with PCP in 1-2 weeks 2. Please obtain BMP/CBC in one week 3. Please stop taking your HCTZ.  Home Health: Yes Home health PT Equipment/Devices: Tub bench and 3 and 1 bedside commode  Discharge Condition: Stable CODE STATUS: Full Diet recommendation: Heart Healthy / Carb Modified    Brief/Interim Summary:  #) Acute on chronic hyponatremia: Patient was noted on admission labs to have significant hyponatremia which approximately 1 year ago was new.  It is unclear if this is related to her alcoholism, SSRI use, HC TZ use or lung disease or all of the above.  Her HCTZ was held and she was given a fluid challenge and oral sodium tablets with improvement in her sodium.  She was told to discontinue the HCTZ and have her sodium checked as an outpatient.  Her urine sodium was greater than 20 and her serum osmolality was low.  Her urine osmolality was variable.  Suspect that there is a multiple different competing reasons.  #) Back pain/compression fracture of T11/12.:  This occurred in the setting of a fall.  She had no evidence of neurologic impairment.  She was given a physical therapy and occupational therapy consult with recommendations of 3 and 1 bedside commode and tub and shower bench.  She was discharged with oral pain control.  She was given home health PT orders.  #) Alcohol abuse: Patient was given counseling on excessive alcohol use.  She was maintained on CIWA protocol here.Did not require  She did not require any benzodiazepine.  She was given folate and thiamine supplementation.  #) COPD on 2 L: Patient was continued on home ICS/LABA and PRN short-acting bronchial dilators.  #) Hypertension/hyperlipidemia: Patient was  continued on home amlodipine, valsartan, atorvastatin.  Her home HCTZ was discontinued.  #) Pain/psych: Patient was continued on home paroxetine and diazepam.  Discharge Diagnoses:  Principal Problem:   Hyponatremia Active Problems:   Essential hypertension   COPD exacerbation ([place])   Smoker   Dyslipidemia associated with type 2 diabetes mellitus (HCC)   Diabetes mellitus, type II, insulin dependent (HCC)   GERD without esophagitis   Bilateral back pain   Compression fracture of thoracic vertebra (HCC)   Hypokalemia   Fall   Microcytic anemia   Alcohol use   Depression   Hypomagnesemia    Discharge Instructions  Discharge Instructions    Call MD for:  difficulty breathing, headache or visual disturbances   Complete by:  As directed    Call MD for:  hives   Complete by:  As directed    Call MD for:  persistant dizziness or light-headedness   Complete by:  As directed    Call MD for:  persistant nausea and vomiting   Complete by:  As directed    Call MD for:  redness, tenderness, or signs of infection (pain, swelling, redness, odor or green/yellow discharge around incision site)   Complete by:  As directed    Call MD for:  severe uncontrolled pain   Complete by:  As directed    Call MD for:  temperature >100.4   Complete by:  As directed    Diet - low sodium heart healthy   Complete by:  As directed    Discharge instructions   Complete by:  As directed    Please follow-up with your primary care doctor in 1 to 2 weeks to have your sodium rechecked.  Please stop taking the HCTZ blood pressure medication.  Please check your blood pressure at least once a week and write it down for your primary care doctor.   Increase activity slowly   Complete by:  As directed      Allergies as of 08/27/2017   No Known Allergies     Medication List    STOP taking these medications   arformoterol 15 MCG/2ML Nebu Commonly known as:  BROVANA   budesonide 0.5 MG/2ML nebulizer  solution Commonly known as:  PULMICORT   diazepam 5 MG tablet Commonly known as:  VALIUM   guaiFENesin 600 MG 12 hr tablet Commonly known as:  MUCINEX   hydrochlorothiazide 12.5 MG tablet Commonly known as:  HYDRODIURIL   insulin aspart 100 UNIT/ML injection Commonly known as:  novoLOG   ipratropium-albuterol 0.5-2.5 (3) MG/3ML Soln Commonly known as:  DUONEB     TAKE these medications   albuterol (2.5 MG/3ML) 0.083% nebulizer solution Commonly known as:  PROVENTIL Take 3 mLs (2.5 mg total) by nebulization every 2 (two) hours as needed for wheezing or shortness of breath.   amLODipine-valsartan 5-160 MG tablet Commonly known as:  EXFORGE Take 1 tablet by mouth daily.   aspirin EC 81 MG tablet Take 81 mg by mouth daily.   atorvastatin 20 MG tablet Commonly known as:  LIPITOR Take 20 mg by mouth every evening.   BYETTA 10 MCG PEN 10 MCG/0.04ML Sopn injection Generic drug:  exenatide Inject 10 mcg into the skin 2 (two) times daily.   ipratropium 17 MCG/ACT inhaler Commonly known as:  ATROVENT HFA Inhale 2 puffs into the lungs every 6 (six) hours as needed for wheezing.   methocarbamol 500 MG tablet Commonly known as:  ROBAXIN Take 1 tablet (500 mg total) by mouth every 8 (eight) hours as needed for muscle spasms.   omeprazole 20 MG capsule Commonly known as:  PRILOSEC Take 1 tablet by mouth daily.   oxyCODONE-acetaminophen 5-325 MG tablet Commonly known as:  PERCOCET/ROXICET Take 1 tablet by mouth every 4 (four) hours as needed for up to 5 days for moderate pain.   PARoxetine 30 MG tablet Commonly known as:  PAXIL Take 30 mg by mouth daily.   sitaGLIPtin-metformin 50-1000 MG tablet Commonly known as:  JANUMET Take 1 tablet by mouth 2 (two) times daily with a meal.   SYMBICORT 160-4.5 MCG/ACT inhaler Generic drug:  budesonide-formoterol Inhale 2 puffs into the lungs 2 (two) times daily.            Durable Medical Equipment  (From admission, onward)          Start     Ordered   08/27/17 1043  DME 3-in-1  Once     08/27/17 1043   08/27/17 1043  DME tub bench  Once     08/27/17 1043          No Known Allergies  Consultations:  None   Procedures/Studies: Ct Abdomen Pelvis W Contrast  Result Date: 08/24/2017 CLINICAL DATA:  Fall last week. Patient now bilateral flank pain and nausea. EXAM: CT ABDOMEN AND PELVIS WITH CONTRAST TECHNIQUE: Multidetector CT imaging of the abdomen and pelvis was performed using the standard protocol following bolus administration of intravenous contrast. CONTRAST:  14mL ISOVUE-300 IOPAMIDOL (ISOVUE-300) INJECTION 61% COMPARISON:  None. FINDINGS: Lower chest: Minor lung base scarring.  No acute  findings. Hepatobiliary: No focal liver abnormality is seen. No gallstones, gallbladder wall thickening, or biliary dilatation. Pancreas: Unremarkable. No pancreatic ductal dilatation or surrounding inflammatory changes. Spleen: Normal in size without focal abnormality. Adrenals/Urinary Tract: No adrenal masses. 5 mm low-density mass, midpole left kidney, consistent with a cyst. No other renal masses. There is decreased enhancement of the mid to upper pole the right kidney in the posterior aspect of the lower pole. This appears vascular in etiology likely due to chronic atherosclerotic vessel narrowing. No intrarenal stones. No hydronephrosis. Ureters are normal in course and in caliber. Bladder is unremarkable. Stomach/Bowel: No evidence of bowel injury. Stomach is unremarkable. Bowel is normal in caliber. No wall thickening or inflammation. There are numerous left colon diverticula mostly along the sigmoid. Normal appendix visualized. Vascular/Lymphatic: Dense aortic atherosclerosis and mild ectasia. Maximum AP diameter of 2.2 cm. No pathologically enlarged lymph nodes. Reproductive: Right posterior subserosal uterine fibroid measuring 4.9 cm. No adnexal masses. Other: No abdominal wall hernia.  No ascites.  Musculoskeletal: There is a moderate to severe compression fracture of T12 with mild depression of the upper endplate of K16. Both of these findings are new from a chest CT dated 10/08/2016. There is mild haziness in the perivertebral fat adjacent to the T12 vertebra consistent with edema. No other fractures.  No osteoblastic or osteolytic lesions. IMPRESSION: 1. Moderate to severe compression fracture of T12 with a mild compression fracture of T11. These findings are new since the prior CT consistent with the reported fall last week. 2. No other acute or recent abnormalities. 3. Decreased enhancement of portions of the right kidney consistent with significant renal vascular disease. Consider nonemergent follow-up renal CTA for further assessment. 4. Extensive aortic atherosclerosis. 5. 4.9 cm uterine fibroid. 6. Numerous colonic diverticula without evidence of diverticulitis. Electronically Signed   By: Lajean Manes M.D.   On: 08/24/2017 20:40     Subjective:   Discharge Exam: Vitals:   08/27/17 0410 08/27/17 0929  BP: (!) 147/51   Pulse: 76   Resp: 16   Temp: (!) 97.4 F (36.3 C)   SpO2: 98% 97%   Vitals:   08/26/17 1738 08/26/17 2046 08/27/17 0410 08/27/17 0929  BP: (!) 147/60 (!) 118/101 (!) 147/51   Pulse: 93 89 76   Resp: 20 16 16    Temp: 97.9 F (36.6 C) (!) 97.5 F (36.4 C) (!) 97.4 F (36.3 C)   TempSrc: Oral Oral Oral   SpO2: 97% 94% 98% 97%  Weight:       General exam: Appears calm and comfortable  Respiratory system: No increased work of breathing, scattered wheezes, prolonged expiratory phase, no rhonchi or crackles Cardiovascular system: Distant heart sounds, regular rate and rhythm, no murmurs Gastrointestinal system: Abdomen is nondistended, soft and nontender. No organomegaly or masses felt. Normal bowel sounds heard. Central nervous system: Alert and oriented. No focal neurological deficits.  Midline tenderness over lumbar spine Extremities: No lower extremity  edema. Skin: Excoriations noted over the bilateral arms Psychiatry: Judgement and insight appear normal. Mood & affect appropriate.   The results of significant diagnostics from this hospitalization (including imaging, microbiology, ancillary and laboratory) are listed below for reference.     Microbiology: No results found for this or any previous visit (from the past 240 hour(s)).   Labs: BNP (last 3 results) Recent Labs    08/24/17 2151  BNP 01.0   Basic Metabolic Panel: Recent Labs  Lab 08/24/17 1415 08/24/17 2151 08/25/17 9323 08/26/17 0442 08/26/17 1543  08/27/17 0609  NA 124*  --  123* 124* 127* 127*  K 3.4*  --  3.6 3.3*  --  3.8  CL 77*  --  80* 80*  --  88*  CO2 32  --  31 32  --  32  GLUCOSE 83  --  105* 107*  --  120*  BUN 12  --  12 8  --  8  CREATININE 0.76  --  0.73 0.64  --  0.58  CALCIUM 9.5  --  9.0 8.8*  --  8.6*  MG  --  1.1* 1.2* 1.3*  --  1.8  PHOS  --  3.3  --   --   --   --    Liver Function Tests: Recent Labs  Lab 08/24/17 1415  AST 20  ALT 16  ALKPHOS 63  BILITOT 0.9  PROT 6.2*  ALBUMIN 3.5   Recent Labs  Lab 08/24/17 1415  LIPASE 36   No results for input(s): AMMONIA in the last 168 hours. CBC: Recent Labs  Lab 08/24/17 1415 08/25/17 0130 08/26/17 0442 08/27/17 0609  WBC 10.9* 11.3* 7.8 7.0  HGB 9.0* 8.7* 7.8* 8.0*  HCT 28.7* 28.1* 24.9* 26.6*  MCV 74.4* 75.1* 75.0* 76.2*  PLT 252 221 208 232   Cardiac Enzymes: No results for input(s): CKTOTAL, CKMB, CKMBINDEX, TROPONINI in the last 168 hours. BNP: Invalid input(s): POCBNP CBG: Recent Labs  Lab 08/26/17 0801 08/26/17 1208 08/26/17 1557 08/26/17 2045 08/27/17 0733  GLUCAP 102* 117* 101* 116* 115*   D-Dimer No results for input(s): DDIMER in the last 72 hours. Hgb A1c No results for input(s): HGBA1C in the last 72 hours. Lipid Profile No results for input(s): CHOL, HDL, LDLCALC, TRIG, CHOLHDL, LDLDIRECT in the last 72 hours. Thyroid function studies Recent  Labs    08/25/17 0349  TSH 1.228   Anemia work up Recent Labs    08/24/17 2151  VITAMINB12 537  FOLATE 34.0  FERRITIN 13  TIBC 349  IRON 16*  RETICCTPCT 2.7   Urinalysis    Component Value Date/Time   COLORURINE YELLOW 08/24/2017 2000   APPEARANCEUR CLEAR 08/24/2017 2000   LABSPEC 1.018 08/24/2017 2000   PHURINE 5.0 08/24/2017 2000   GLUCOSEU NEGATIVE 08/24/2017 2000   HGBUR NEGATIVE 08/24/2017 2000   BILIRUBINUR NEGATIVE 08/24/2017 2000   KETONESUR 5 (A) 08/24/2017 2000   PROTEINUR NEGATIVE 08/24/2017 2000   NITRITE NEGATIVE 08/24/2017 2000   LEUKOCYTESUR NEGATIVE 08/24/2017 2000   Sepsis Labs Invalid input(s): PROCALCITONIN,  WBC,  LACTICIDVEN Microbiology No results found for this or any previous visit (from the past 240 hour(s)).   Time coordinating discharge: Over 30 minutes  SIGNED:   Cristy Folks, MD  [facility] 08/27/2017, 10:43 AM  If 7PM-7AM, please contact night-coverage [email or web address] Password [email or web address]

## 2017-08-27 NOTE — Progress Notes (Signed)
Patient discharged to home with brother. After visit Summary reviewed. Patient capable of reverbalizing medications and follow up visits. No signs and symptoms of distress noted. Peripheral IV out.  Patient educated to return to the ED in the case of an emergency. Dillon Bjork RN

## 2017-08-27 NOTE — Discharge Instructions (Signed)
Hyponatremia Hyponatremia is when the amount of salt (sodium) in your blood is too low. When salt levels are low, your cells absorb extra water and they swell. The swelling happens throughout the body, but it mostly affects the brain. Follow these instructions at home:  Take medicines only as told by your doctor. Many medicines can make this condition worse. Talk with your doctor about any medicines that you are currently taking.  Carefully follow a recommended diet as told by your doctor.  Carefully follow instructions from your doctor about fluid restrictions.  Keep all follow-up visits as told by your doctor. This is important.  Do not drink alcohol. Contact a doctor if:  You feel sicker to your stomach (nauseous).  You feel more confused.  You feel more tired (fatigued).  Your headache gets worse.  You feel weaker.  Your symptoms go away and then they come back.  You have trouble following the diet instructions. Get help right away if:  You start to twitch and shake (have a seizure).  You pass out (faint).  You keep having watery poop (diarrhea).  You keep throwing up (vomiting). This information is not intended to replace advice given to you by your health care provider. Make sure you discuss any questions you have with your health care provider. Document Released: 09/02/2010 Document Revised: 05/29/2015 Document Reviewed: 12/17/2013 Elsevier Interactive Patient Education  2018 Elsevier Inc.  

## 2017-08-27 NOTE — Care Management Note (Addendum)
Case Management Note  Patient Details  Name: [name] MRN: [id number] Date of Birth: 16-Aug-1945  Subjective/Objective:       Pt presented for weakness and back pain after fall.  Pt from home alone with DME O2 through [facility]. Pt has a walker and 3n1 and denies need for other DME.   Pt's brother here to take patient home and assists with care.         Action/Plan: Pt and brother choose [facility] to provide [facility] PT/OT.  Referral called to [facility] and information placed on AVS.  SOC within 48 hours.   Expected Discharge Date:  08/27/17               Expected Discharge Plan:  [place]  In-House Referral:  NA  Discharge planning Services  CM Consult  Post Acute Care Choice:  Home Health Choice offered to:  Patient, Sibling  DME Arranged:  N/A DME Agency:  NA  HH Arranged:  PT [place] Agency:  [facility]  Status of Service:  Completed, signed off  If discussed at [place] of Stay Meetings, dates discussed:    Additional Comments:  Claudie Leach, RN 08/27/2017, 2:04 PM

## 2017-09-01 DIAGNOSIS — J449 Chronic obstructive pulmonary disease, unspecified: Secondary | ICD-10-CM | POA: Diagnosis not present

## 2017-10-02 DIAGNOSIS — J449 Chronic obstructive pulmonary disease, unspecified: Secondary | ICD-10-CM | POA: Diagnosis not present

## 2017-10-17 DIAGNOSIS — J439 Emphysema, unspecified: Secondary | ICD-10-CM | POA: Diagnosis not present

## 2017-10-17 DIAGNOSIS — I2510 Atherosclerotic heart disease of native coronary artery without angina pectoris: Secondary | ICD-10-CM | POA: Diagnosis not present

## 2017-10-17 DIAGNOSIS — I10 Essential (primary) hypertension: Secondary | ICD-10-CM | POA: Diagnosis not present

## 2017-10-17 DIAGNOSIS — E11621 Type 2 diabetes mellitus with foot ulcer: Secondary | ICD-10-CM | POA: Diagnosis not present

## 2017-10-17 DIAGNOSIS — K219 Gastro-esophageal reflux disease without esophagitis: Secondary | ICD-10-CM | POA: Diagnosis not present

## 2017-10-17 DIAGNOSIS — E1142 Type 2 diabetes mellitus with diabetic polyneuropathy: Secondary | ICD-10-CM | POA: Diagnosis not present

## 2017-10-17 DIAGNOSIS — E538 Deficiency of other specified B group vitamins: Secondary | ICD-10-CM | POA: Diagnosis not present

## 2017-10-17 DIAGNOSIS — R69 Illness, unspecified: Secondary | ICD-10-CM | POA: Diagnosis not present

## 2017-10-17 DIAGNOSIS — Z23 Encounter for immunization: Secondary | ICD-10-CM | POA: Diagnosis not present

## 2017-10-17 DIAGNOSIS — E7800 Pure hypercholesterolemia, unspecified: Secondary | ICD-10-CM | POA: Diagnosis not present

## 2017-10-18 ENCOUNTER — Telehealth (HOSPITAL_COMMUNITY): Payer: Self-pay | Admitting: Surgery

## 2017-10-18 NOTE — Telephone Encounter (Signed)
Received an ABI order from [facility], Dr. Diamantina Monks-  A voicemail was left with patients brother, Gordan Payment [phone], asking him to call Rip Harbour at [facility] to make arrangements.

## 2017-11-01 DIAGNOSIS — J449 Chronic obstructive pulmonary disease, unspecified: Secondary | ICD-10-CM | POA: Diagnosis not present

## 2017-11-02 ENCOUNTER — Other Ambulatory Visit (HOSPITAL_COMMUNITY): Payer: Self-pay | Admitting: Family Medicine

## 2017-11-02 DIAGNOSIS — L97509 Non-pressure chronic ulcer of other part of unspecified foot with unspecified severity: Principal | ICD-10-CM

## 2017-11-02 DIAGNOSIS — E11621 Type 2 diabetes mellitus with foot ulcer: Secondary | ICD-10-CM

## 2017-11-02 DIAGNOSIS — E1169 Type 2 diabetes mellitus with other specified complication: Principal | ICD-10-CM

## 2017-11-02 DIAGNOSIS — M869 Osteomyelitis, unspecified: Principal | ICD-10-CM

## 2017-11-03 ENCOUNTER — Other Ambulatory Visit: Payer: Self-pay | Admitting: Family Medicine

## 2017-11-03 DIAGNOSIS — F17200 Nicotine dependence, unspecified, uncomplicated: Secondary | ICD-10-CM

## 2017-11-04 ENCOUNTER — Ambulatory Visit (HOSPITAL_COMMUNITY)
Admission: RE | Admit: 2017-11-04 | Discharge: 2017-11-04 | Disposition: A | Discharge status: Home / Self Care | Payer: Medicare HMO | Source: Ambulatory Visit | Attending: Family Medicine | Admitting: Family Medicine

## 2017-11-04 DIAGNOSIS — M869 Osteomyelitis, unspecified: Secondary | ICD-10-CM | POA: Diagnosis not present

## 2017-11-04 DIAGNOSIS — L97509 Non-pressure chronic ulcer of other part of unspecified foot with unspecified severity: Secondary | ICD-10-CM | POA: Diagnosis not present

## 2017-11-04 DIAGNOSIS — E11621 Type 2 diabetes mellitus with foot ulcer: Secondary | ICD-10-CM | POA: Diagnosis not present

## 2017-11-04 DIAGNOSIS — E1169 Type 2 diabetes mellitus with other specified complication: Secondary | ICD-10-CM | POA: Insufficient documentation

## 2017-11-24 ENCOUNTER — Ambulatory Visit: Payer: Medicare HMO | Admitting: Podiatry

## 2017-11-24 ENCOUNTER — Other Ambulatory Visit: Payer: Self-pay | Admitting: Podiatry

## 2017-11-24 ENCOUNTER — Ambulatory Visit (INDEPENDENT_AMBULATORY_CARE_PROVIDER_SITE_OTHER): Payer: Medicare HMO

## 2017-11-24 VITALS — BP 161/80 | HR 90

## 2017-11-24 DIAGNOSIS — L97512 Non-pressure chronic ulcer of other part of right foot with fat layer exposed: Secondary | ICD-10-CM

## 2017-11-24 DIAGNOSIS — E1151 Type 2 diabetes mellitus with diabetic peripheral angiopathy without gangrene: Secondary | ICD-10-CM

## 2017-11-24 DIAGNOSIS — M79671 Pain in right foot: Secondary | ICD-10-CM

## 2017-11-24 MED ORDER — DOXYCYCLINE HYCLATE 100 MG PO TABS: 100.0000 mg | ORAL_TABLET | Freq: Two times a day (BID) | ORAL | 0 refills | Status: DC

## 2017-11-25 ENCOUNTER — Telehealth: Payer: Self-pay | Admitting: *Deleted

## 2017-11-25 DIAGNOSIS — L97512 Non-pressure chronic ulcer of other part of right foot with fat layer exposed: Secondary | ICD-10-CM

## 2017-11-25 DIAGNOSIS — E1151 Type 2 diabetes mellitus with diabetic peripheral angiopathy without gangrene: Secondary | ICD-10-CM

## 2017-11-25 DIAGNOSIS — R0989 Other specified symptoms and signs involving the circulatory and respiratory systems: Secondary | ICD-10-CM

## 2017-11-25 NOTE — Telephone Encounter (Signed)
Ordered referral, waiting clinicals and demographics to fax to [facility].

## 2017-11-25 NOTE — Telephone Encounter (Signed)
-----   Message from Trula Slade, DPM sent at 11/24/2017  2:57 PM EST ----- Can you please order santyl for her as well? Thanks  Wound is 1.5 x 1.5 x 0.2

## 2017-11-25 NOTE — Progress Notes (Signed)
Subjective:   Patient ID: [name], female   DOB: 72 y.o.   MRN: [id number]   HPI [name] presents to the office today for concerns of an ulcer on the right foot along the big toe which is been ongoing for about 1 month duration.  She states that she was on antibiotics by her primary care physician.  She denies any redness on the area denies any drainage or pus.  She did have arterial studies performed previously.  She has not seen a vascular surgeon.  She is diabetic and she thinks her A1c is around 5.  She has no other concerns today.  Review of Systems  All other systems reviewed and are negative.  Past Medical History:  Diagnosis Date  . COPD (chronic obstructive pulmonary disease) ([place])   . Diabetes mellitus   . Diastolic dysfunction    history  . Hyperlipidemia   . Hypertension     Past Surgical History:  Procedure Laterality Date  . TUBAL LIGATION       Current Outpatient Medications:  .  albuterol (PROVENTIL) (2.5 MG/3ML) 0.083% nebulizer solution, Take 3 mLs (2.5 mg total) by nebulization every 2 (two) hours as needed for wheezing or shortness of breath., Disp: 75 mL, Rfl: 12 .  amLODipine-valsartan (EXFORGE) 5-160 MG tablet, Take 1 tablet by mouth daily., Disp: , Rfl:  .  aspirin EC 81 MG tablet, Take 81 mg by mouth daily., Disp: , Rfl:  .  atorvastatin (LIPITOR) 20 MG tablet, Take 20 mg by mouth every evening., Disp: , Rfl:  .  BYETTA 10 MCG PEN 10 MCG/0.04ML SOPN injection, Inject 10 mcg into the skin 2 (two) times daily., Disp: , Rfl: 6 .  diazepam (VALIUM) 5 MG tablet, TAKE 1 TABLET BY MOUTH 1 TO 2 TIMES DAILY AS NEEDED, Disp: , Rfl: 0 .  doxycycline (VIBRA-TABS) 100 MG tablet, Take 100 mg by mouth 2 (two) times daily. for 10 days, Disp: , Rfl: 0 .  ipratropium (ATROVENT HFA) 17 MCG/ACT inhaler, Inhale 2 puffs into the lungs every 6 (six) hours as needed for wheezing., Disp: , Rfl:  .  methocarbamol (ROBAXIN) 500 MG tablet, Take 1 tablet (500 mg total)  by mouth every 8 (eight) hours as needed for muscle spasms., Disp: 30 tablet, Rfl: 0 .  omeprazole (PRILOSEC) 20 MG capsule, Take 1 tablet by mouth daily., Disp: , Rfl:  .  PARoxetine (PAXIL) 30 MG tablet, Take 30 mg by mouth daily. , Disp: , Rfl:  .  sitaGLIPtan-metformin (JANUMET) 50-1000 MG per tablet, Take 1 tablet by mouth 2 (two) times daily with a meal.  , Disp: , Rfl:  .  SYMBICORT 160-4.5 MCG/ACT inhaler, Inhale 2 puffs into the lungs 2 (two) times daily., Disp: , Rfl: 0 .  doxycycline (VIBRA-TABS) 100 MG tablet, Take 1 tablet (100 mg total) by mouth 2 (two) times daily., Disp: 20 tablet, Rfl: 0  No Known Allergies      Objective:  Physical Exam  General: AAO x3, NAD  Dermatological: On the plantar aspect of the right hallux is an ulceration measuring about 1.5 x 1.5 cm that is superficial.  There is dark discoloration to the wound bed as in the picture below however this is more due to debris on the wound.  After I was able to debride the there was granulation tissue with fibrotic tissue as well.  There is no probing to bone, undermining or tunneling.  There is minimal surrounding erythema there is  no fluctuation or crepitation or any ascending cellulitis.      Vascular: DP, PT pulses decreased, CRT less than 3 seconds.  Neruologic: Decreased sensation with Semmes Weinstein monofilament.  Musculoskeletal: No gross boney pedal deformities bilateral. No pain, crepitus, or limitation noted with foot and ankle range of motion bilateral. Muscular strength 5/5 in all groups tested bilateral.     Assessment:   Right hallux ulceration     Plan:  -Treatment options discussed including all alternatives, risks, and complications -Etiology of symptoms were discussed -X-rays were obtained reviewed.  There is no definitive cortical changes suggestive of osteomyelitis at this time there is no soft tissue emphysema present. -Sharply debrided the wound today to down to healthy, granular  tissue.  Some fibrotic tissue remains.  Because of this I did order Santyl for her. -Reviewed previous arterial studies.  Will order vascular consult. -Surgical shoe with offloading pads dispensed. -Monitor for any clinical signs or symptoms of infection and directed to call the office immediately should any occur or go to the ER.  Return in about 10 days (around 12/04/2017).  Trula Slade DPM

## 2017-11-25 NOTE — Telephone Encounter (Signed)
-----   Message from Trula Slade, DPM sent at 11/24/2017  2:55 PM EST ----- Can you please order a vascular or Dr berry consult due to decreased pulses and wound on the right side? She already had arterial studies by her PCP.

## 2017-11-25 NOTE — Telephone Encounter (Signed)
Waiting clinicals for Santyl rx.

## 2017-11-25 NOTE — Telephone Encounter (Signed)
done

## 2017-11-28 NOTE — Telephone Encounter (Signed)
FAXED REQUIRED FORM, AND DEMOGRAPHICS TO [facility].

## 2017-12-02 DIAGNOSIS — J449 Chronic obstructive pulmonary disease, unspecified: Secondary | ICD-10-CM | POA: Diagnosis not present

## 2017-12-05 ENCOUNTER — Ambulatory Visit: Payer: Medicare HMO | Admitting: Podiatry

## 2017-12-08 ENCOUNTER — Encounter: Payer: Self-pay | Admitting: Podiatry

## 2017-12-08 ENCOUNTER — Ambulatory Visit: Payer: Medicare HMO | Admitting: Podiatry

## 2017-12-08 DIAGNOSIS — L97512 Non-pressure chronic ulcer of other part of right foot with fat layer exposed: Secondary | ICD-10-CM

## 2017-12-08 DIAGNOSIS — E1151 Type 2 diabetes mellitus with diabetic peripheral angiopathy without gangrene: Secondary | ICD-10-CM

## 2017-12-15 NOTE — Progress Notes (Signed)
Subjective: 72 year old female presents the office today for follow-up evaluation of right hallux ulceration.  She is overall she is doing better she is finished her antibiotics.  She denies any pain to the area she denies any redness or drainage or any swelling.  Is also been using the Santyl on the wound but she just recently started this.  She has no new concerns. Denies any systemic complaints such as fevers, chills, nausea, vomiting. No acute changes since last appointment, and no other complaints at this time.   She is scheduled to see vascular on 12/21/2017.  Objective: AAO x3, NAD DP/PT pulses decreased bilaterally, CRT less than 3 seconds Ulceration continues on the right hallux.  Overall the wound appears to be more granular nature but still some fibrotic tissue is present.  The wound today measures 1.2 x 1.2 cm.  There is no probing to bone, undermining or tunneling.  There is no surrounding erythema, ascending cellulitis.  There is no fluctuation palpation malodor. No open lesions or pre-ulcerative lesions.  No pain with calf compression, swelling, warmth, erythema  Assessment: Ulceration right hallux  Plan: -All treatment options discussed with the patient including all alternatives, risks, complications.  -I did debride the wound today to try to remove as much of the fibrotic tissues I could with a tissue nipper as well as a 312 blade scalpel.  Continue Santyl dressing changes and offloading at all times.  Follow-up with Dr. Gwenlyn Found as scheduled.  -Monitor for any clinical signs or symptoms of infection and directed to call the office immediately should any occur or go to the ER. -Patient encouraged to call the office with any questions, concerns, change in symptoms.   Trula Slade DPM

## 2017-12-19 ENCOUNTER — Ambulatory Visit: Payer: Medicare HMO | Admitting: Podiatry

## 2017-12-19 ENCOUNTER — Encounter: Payer: Self-pay | Admitting: Podiatry

## 2017-12-19 DIAGNOSIS — E1151 Type 2 diabetes mellitus with diabetic peripheral angiopathy without gangrene: Secondary | ICD-10-CM

## 2017-12-19 DIAGNOSIS — L97512 Non-pressure chronic ulcer of other part of right foot with fat layer exposed: Secondary | ICD-10-CM | POA: Diagnosis not present

## 2017-12-21 ENCOUNTER — Encounter: Payer: Self-pay | Admitting: Cardiovascular Disease

## 2017-12-21 ENCOUNTER — Ambulatory Visit: Payer: Medicare HMO | Admitting: Cardiovascular Disease

## 2017-12-21 VITALS — BP 128/54 | HR 96 | Ht 68.0 in | Wt 159.0 lb

## 2017-12-21 DIAGNOSIS — I998 Other disorder of circulatory system: Secondary | ICD-10-CM

## 2017-12-21 DIAGNOSIS — Z959 Presence of cardiac and vascular implant and graft, unspecified: Secondary | ICD-10-CM | POA: Diagnosis not present

## 2017-12-21 DIAGNOSIS — I10 Essential (primary) hypertension: Secondary | ICD-10-CM

## 2017-12-21 DIAGNOSIS — I70229 Atherosclerosis of native arteries of extremities with rest pain, unspecified extremity: Secondary | ICD-10-CM

## 2017-12-21 DIAGNOSIS — Z9889 Other specified postprocedural states: Secondary | ICD-10-CM | POA: Insufficient documentation

## 2017-12-21 NOTE — Assessment & Plan Note (Signed)
[name] was referred by Dr. Jacqualyn Posey, her podiatrist, for peripheral vascular evaluation because of critical ischemia.  She does have a small ulcer on the plantar surface of her right hallux which is been there for at least several months.  She is seen Dr. Jacqualyn Posey in the office several times.  Risk factors include treated hypertension, diabetes and hyperlipidemia as well as greater than 100 pack years of tobacco abuse smoking 1 pack/day.  She denies claudication.  She did have segmental pressures performed recently revealing a right ABI 0.6 and a left  Of .89.  She had monophasic tibial waveforms.  Her renal function is normal.  I am going to get lower extremity arterial Doppler studies to further evaluate the level of her arterial obstruction which I suspect is tibial.  At this point, I am going to defer to Dr. Jacqualyn Posey.  I prefer aggressive local wound care and reserve intervention for failure of that.

## 2017-12-21 NOTE — Addendum Note (Signed)
Addended by: Annita Brod on: 12/21/2017 10:29 AM   Modules accepted: Orders

## 2017-12-21 NOTE — Patient Instructions (Signed)
Medication Instructions:  Your physician recommends that you continue on your current medications as directed. Please refer to the Current Medication list given to you today. If you need a refill on your cardiac medications before your next appointment, please call your pharmacy.   Lab work: NONE If you have labs (blood work) drawn today and your tests are completely normal, you will receive your results only by: Marland Kitchen MyChart Message (if you have MyChart) OR . A paper copy in the mail If you have any lab test that is abnormal or we need to change your treatment, we will call you to review the results.  Testing/Procedures: Your physician has requested that you have a lower or upper extremity arterial duplex. This test is an ultrasound of the arteries in the legs or arms. It looks at arterial blood flow in the legs and arms. Allow one hour for Lower and Upper Arterial scans. There are no restrictions or special instructions  SCHEDULE BEFORE 1 MONTH FOLLOW UP WITH DR. Gwenlyn Found  Follow-Up: At [facility], you and your health needs are our priority.  As part of our continuing mission to provide you with exceptional heart care, we have created designated Provider Care Teams.  These Care Teams include your primary Cardiologist (physician) and Advanced Practice Providers (APPs -  Physician Assistants and Nurse Practitioners) who all work together to provide you with the care you need, when you need it.  You will need a follow up appointment in [place]. Gwenlyn Found

## 2017-12-21 NOTE — Progress Notes (Signed)
12/21/2017 [name]   1945/02/02  [id number]  Primary Physician Tammy Amel, MD Primary Cardiologist: Tammy Harp MD [name], [place]  HPI:  [name] is a 72 y.o. thin appearing widowed Caucasian female mother of 2 living sons with 1 daughter this deceased, grandmother of 5 grandchildren referred by Dr. Jacqualyn Lane, her podiatrist, for evaluation of critical limb ischemia.  Risk factors include greater than 100 pack years of tobacco abuse currently smoking a pack a day, treated diabetes, hypertension and hyperlipidemia.  She does have COPD.  She is never had a heart attack or stroke.  She does have peripheral neuropathy.  She is had a wound on the plantar surface of her right hallux for several months and has been seeing Dr. Jacqualyn Lane recently for evaluation of this.  Recent arterial segmental pressures performed 11/04/2017 revealed a right ABI 0.6 and a left ABI of 0.89.  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   No outpatient medications have been marked as taking for the 12/21/17 encounter (Office Visit) with Tammy Harp, MD.     No Known Allergies  Social History   Socioeconomic History  . Marital status: Widowed    Spouse name: Not on file  . Number of children: Not on file  . Years of education: Not on file  . Highest education level: Not on file  Occupational History    Comment: Worked in home  Social Needs  . Financial resource strain: Not on file  . Food insecurity:    Worry: Not on file    Inability: Not on file  . Transportation needs:    Medical: Not on file    Non-medical: Not on file  Tobacco Use  . Smoking status: Current Every Day Smoker    Packs/day: 1.00    Years: 52.00    Pack years: 52.00  . Smokeless tobacco: Never Used  Substance and Sexual Activity  . Alcohol use: Not on file  . Drug use: Not on file  . Sexual activity: Not on file  Lifestyle  .  Physical activity:    Days per week: Not on file    Minutes per session: Not on file  . Stress: Not on file  Relationships  . Social connections:    Talks on phone: Not on file    Gets together: Not on file    Attends religious service: Not on file    Active member of club or organization: Not on file    Attends meetings of clubs or organizations: Not on file    Relationship status: Not on file  . Intimate partner violence:    Fear of current or ex partner: Not on file    Emotionally abused: Not on file    Physically abused: Not on file    Forced sexual activity: Not on file  Other Topics Concern  . Not on file  Social History Narrative  . Not on file     Review of Systems: General: negative for chills, fever, night sweats or weight changes.  Cardiovascular: negative for chest pain, dyspnea on exertion, edema, orthopnea, palpitations, paroxysmal nocturnal dyspnea or shortness of breath Dermatological: negative for rash Respiratory: negative for cough or wheezing Urologic: negative for hematuria Abdominal: negative for nausea, vomiting, diarrhea, bright red blood per rectum, melena, or hematemesis Neurologic: negative for visual changes, syncope, or dizziness All other systems reviewed and are otherwise negative  except as noted above.    Blood pressure (!) 128/54, pulse 96, height 5\' 8"  (1.727 m), weight 159 lb (72.1 kg).  General appearance: alert and no distress Neck: no adenopathy, no carotid bruit, no JVD, supple, symmetrical, trachea midline and thyroid not enlarged, symmetric, no tenderness/mass/nodules Lungs: clear to auscultation bilaterally Heart: regular rate and rhythm, S1, S2 normal, no murmur, click, rub or gallop Extremities: Mild erythema surrounding her right hallux Pulses: Absent right pedal pulses Skin: 1 cm shallow ischemic appearing pressure ulcer plantar aspect of right hallux Neurologic: Alert and oriented X 3, normal strength and tone. Normal symmetric  reflexes. Normal coordination and gait  EKG sinus rhythm at 96 with early repolarization changes and septal Q waves.  I personally reviewed this EKG.  ASSESSMENT AND PLAN:   Critical limb ischemia with history of revascularization of same extremity [name] was referred by Dr. Jacqualyn Lane, her podiatrist, for peripheral vascular evaluation because of critical ischemia.  She does have a small ulcer on the plantar surface of her right hallux which is been there for at least several months.  She is seen Dr. Jacqualyn Lane in the office several times.  Risk factors include treated hypertension, diabetes and hyperlipidemia as well as greater than 100 pack years of tobacco abuse smoking 1 pack/day.  She denies claudication.  She did have segmental pressures performed recently revealing a right ABI 0.6 and a left  Of .89.  She had monophasic tibial waveforms.  Her renal function is normal.  I am going to get lower extremity arterial Doppler studies to further evaluate the level of her arterial obstruction which I suspect is tibial.  At this point, I am going to defer to Dr. Jacqualyn Lane.  I prefer aggressive local wound care and reserve intervention for failure of that.      Tammy Harp MD FACP,FACC,FAHA, [facility] 12/21/2017 10:16 AM

## 2017-12-26 ENCOUNTER — Other Ambulatory Visit: Payer: Self-pay | Admitting: Cardiovascular Disease

## 2017-12-26 ENCOUNTER — Encounter: Payer: Self-pay | Admitting: Podiatry

## 2017-12-26 DIAGNOSIS — I998 Other disorder of circulatory system: Secondary | ICD-10-CM

## 2017-12-26 DIAGNOSIS — I70229 Atherosclerosis of native arteries of extremities with rest pain, unspecified extremity: Secondary | ICD-10-CM

## 2017-12-26 NOTE — Progress Notes (Signed)
Subjective: 72 year old female presents the office today for follow-up evaluation of right hallux ulceration. She feels that the wound is doing better.  She has been continue with Santyl dressing changes.  She has an upcoming appointment to see Dr. Gwenlyn Found.  She denies any drainage or pus come from the area and denies any surrounding redness or red streaks.  She is accompanied today by her son.  She has no other concerns today.  Denies any systemic complaints such as fevers, chills, nausea, vomiting. No acute changes since last appointment, and no other complaints at this time.   She is scheduled to see vascular on 12/21/2017.  Objective: AAO x3, NAD DP/PT pulses decreased bilaterally, CRT less than 3 seconds Ulceration continues on the right hallux.  Overall the wound appears to granular and there is no significant fibrotic tissue present.  However the wound does remain the same size which measures 1.2 x 1.2 x 0.2 cm.  There is no probing to bone, undermining or tunneling.  There is no surrounding erythema, ascending cellulitis.  There is no fluctuation palpation malodor. No open lesions or pre-ulcerative lesions.  No pain with calf compression, swelling, warmth, erythema  Assessment: Ulceration right hallux  Plan: -All treatment options discussed with the patient including all alternatives, risks, complications.  -There is not any significant tissue to debride today.  Would continue with Santyl dressing changes daily.  Offloading at all times.  She has not come appointment with Dr. Gwenlyn Found.  Elevation and limit activity.  -Monitor for any clinical signs or symptoms of infection and directed to call the office immediately should any occur or go to the ER. -Patient encouraged to call the office with any questions, concerns, change in symptoms.   *I did speak with Dr. Gwenlyn Found after she saw him.  We will continue with local wound care and continue to monitor.  If no healing will reassess.  Trula Slade DPM

## 2018-01-01 DIAGNOSIS — J449 Chronic obstructive pulmonary disease, unspecified: Secondary | ICD-10-CM | POA: Diagnosis not present

## 2018-01-02 ENCOUNTER — Ambulatory Visit: Payer: Medicare HMO | Admitting: Podiatry

## 2018-01-03 ENCOUNTER — Ambulatory Visit (HOSPITAL_COMMUNITY)
Admission: RE | Admit: 2018-01-03 | Payer: Medicare HMO | Source: Ambulatory Visit | Attending: Cardiovascular Disease | Admitting: Cardiovascular Disease

## 2018-01-03 ENCOUNTER — Inpatient Hospital Stay (HOSPITAL_COMMUNITY): Admission: RE | Admit: 2018-01-03 | Payer: Medicare HMO | Source: Ambulatory Visit

## 2018-01-13 ENCOUNTER — Ambulatory Visit (HOSPITAL_COMMUNITY)
Admission: RE | Admit: 2018-01-13 | Discharge: 2018-01-13 | Disposition: A | Discharge status: Home / Self Care | Payer: Medicare HMO | Source: Ambulatory Visit | Attending: Cardiovascular Disease | Admitting: Cardiovascular Disease

## 2018-01-13 DIAGNOSIS — I998 Other disorder of circulatory system: Secondary | ICD-10-CM | POA: Insufficient documentation

## 2018-01-13 DIAGNOSIS — I70229 Atherosclerosis of native arteries of extremities with rest pain, unspecified extremity: Secondary | ICD-10-CM

## 2018-01-16 ENCOUNTER — Encounter: Payer: Self-pay | Admitting: Podiatry

## 2018-01-16 ENCOUNTER — Other Ambulatory Visit: Payer: Self-pay | Admitting: *Deleted

## 2018-01-16 ENCOUNTER — Ambulatory Visit: Payer: Medicare HMO | Admitting: Podiatry

## 2018-01-16 ENCOUNTER — Ambulatory Visit (INDEPENDENT_AMBULATORY_CARE_PROVIDER_SITE_OTHER): Payer: Medicare HMO

## 2018-01-16 DIAGNOSIS — I70229 Atherosclerosis of native arteries of extremities with rest pain, unspecified extremity: Secondary | ICD-10-CM

## 2018-01-16 DIAGNOSIS — L03119 Cellulitis of unspecified part of limb: Secondary | ICD-10-CM | POA: Diagnosis not present

## 2018-01-16 DIAGNOSIS — L02619 Cutaneous abscess of unspecified foot: Secondary | ICD-10-CM | POA: Diagnosis not present

## 2018-01-16 DIAGNOSIS — Z9889 Other specified postprocedural states: Secondary | ICD-10-CM

## 2018-01-16 DIAGNOSIS — Z959 Presence of cardiac and vascular implant and graft, unspecified: Principal | ICD-10-CM

## 2018-01-16 DIAGNOSIS — L97512 Non-pressure chronic ulcer of other part of right foot with fat layer exposed: Secondary | ICD-10-CM

## 2018-01-16 DIAGNOSIS — I998 Other disorder of circulatory system: Secondary | ICD-10-CM

## 2018-01-16 MED ORDER — SILVER SULFADIAZINE 1 % EX CREA: 1.0000 "application " | TOPICAL_CREAM | Freq: Every day | CUTANEOUS | 0 refills | Status: DC

## 2018-01-16 MED ORDER — CIPROFLOXACIN HCL 500 MG PO TABS: 500.0000 mg | ORAL_TABLET | Freq: Two times a day (BID) | ORAL | 0 refills | Status: DC

## 2018-01-16 MED ORDER — CLINDAMYCIN HCL 300 MG PO CAPS: 300.0000 mg | ORAL_CAPSULE | Freq: Three times a day (TID) | ORAL | 0 refills | Status: DC

## 2018-01-17 NOTE — Progress Notes (Signed)
Subjective: 73 year old female presents the office today for follow-up evaluation of right hallux ulceration.  Since I last seen her she states that her new wound has formed in the last 4 to 5 days they have noticed an increase in swelling and redness to the foot.  Denies any drainage or pus coming from the area. She has dontinue with Santyl dressing changes.  She is wearing the surgical shoe with offloading as well.  She is accompanied today by her son.Denies any systemic complaints such as fevers, chills, nausea, vomiting. No acute changes since last appointment, and no other complaints at this time.   Objective: AAO x3, NAD DP/PT pulses decreased bilaterally, CRT less than 3 seconds Ulceration continues on the right hallux.  The wound is worsened and there is a new wound proximal this area as well fibrotic, necrotic area.  There is edema and erythema mostly to the hallux at the level just proximal to the MPJ but there is no ascending cellulitis.  There is an increase in warmth of the toe there is no increase in warmth of the foot.  There is no significant ascending cellulitis.  Mild swelling to the foot.  There is no fluctuation or crepitation.  No significant malodor.  Minimal tenderness palpation of the wound. No open lesions or pre-ulcerative lesions.  No pain with calf compression, swelling, warmth, erythema      Assessment: Ulceration right hallux with cellulitis  Plan: -All treatment options discussed with the patient including all alternatives, risks, complications.  -X-rays were obtained and reviewed.  Swelling is present this difficult to fully evaluate the bone however no definitive osteomyelitis or soft tissue emphysema is identified today. -I did debride the wound was a tissue nipper as well as a 312 with scalpel to remove some of the nonviable tissue. -Plan switch to Silvadene dressing changes for now hold off on this until. -I ordered clindamycin and ciprofloxacin. -CBC, ESR,  CRP, BMP ordered -Discussed there is any worsening she needs to go immediately to the emergency room for IV antibiotics.  Discussed there is a chance of amputation of the toe. -As the wound has worsening we will discuss with Dr. Alvester Chou as well. -I will see her back on Friday. -Monitor for any clinical signs or symptoms of infection and directed to call the office immediately should any occur or go to the ER.  Trula Slade DPM

## 2018-01-19 ENCOUNTER — Ambulatory Visit (HOSPITAL_COMMUNITY)
Admission: RE | Admit: 2018-01-19 | Discharge: 2018-01-19 | Disposition: A | Discharge status: Home / Self Care | Payer: Medicare HMO | Source: Ambulatory Visit | Attending: Cardiology | Admitting: Cardiology

## 2018-01-19 ENCOUNTER — Ambulatory Visit: Payer: Medicare HMO | Admitting: Podiatry

## 2018-01-19 ENCOUNTER — Other Ambulatory Visit: Payer: Self-pay | Admitting: Podiatry

## 2018-01-19 ENCOUNTER — Encounter: Payer: Self-pay | Admitting: Podiatry

## 2018-01-19 DIAGNOSIS — L97512 Non-pressure chronic ulcer of other part of right foot with fat layer exposed: Secondary | ICD-10-CM

## 2018-01-19 DIAGNOSIS — Z9889 Other specified postprocedural states: Secondary | ICD-10-CM

## 2018-01-19 DIAGNOSIS — L02619 Cutaneous abscess of unspecified foot: Secondary | ICD-10-CM

## 2018-01-19 DIAGNOSIS — E1151 Type 2 diabetes mellitus with diabetic peripheral angiopathy without gangrene: Secondary | ICD-10-CM | POA: Diagnosis not present

## 2018-01-19 DIAGNOSIS — I998 Other disorder of circulatory system: Secondary | ICD-10-CM | POA: Diagnosis not present

## 2018-01-19 DIAGNOSIS — L03119 Cellulitis of unspecified part of limb: Secondary | ICD-10-CM

## 2018-01-19 DIAGNOSIS — Z959 Presence of cardiac and vascular implant and graft, unspecified: Secondary | ICD-10-CM | POA: Insufficient documentation

## 2018-01-19 NOTE — Progress Notes (Signed)
Labs re-ordered

## 2018-01-20 LAB — CBC WITH DIFFERENTIAL/PLATELET
Absolute Monocytes: 777 cells/uL (ref 200–950)
Basophils Absolute: 7 cells/uL (ref 0–200)
Basophils Relative: 0.1 %
EOS PCT: 0.5 %
Eosinophils Absolute: 37 cells/uL (ref 15–500)
HEMATOCRIT: 25.4 % — AB (ref 35.0–45.0)
Hemoglobin: 7.7 g/dL — ABNORMAL LOW (ref 11.7–15.5)
LYMPHS ABS: 1043 {cells}/uL (ref 850–3900)
MCH: 22.3 pg — ABNORMAL LOW (ref 27.0–33.0)
MCHC: 30.3 g/dL — ABNORMAL LOW (ref 32.0–36.0)
MCV: 73.4 fL — ABNORMAL LOW (ref 80.0–100.0)
MPV: 9.5 fL (ref 7.5–12.5)
Monocytes Relative: 10.5 %
NEUTROS PCT: 74.8 %
Neutro Abs: 5535 cells/uL (ref 1500–7800)
PLATELETS: 286 10*3/uL (ref 140–400)
RBC: 3.46 10*6/uL — AB (ref 3.80–5.10)
RDW: 15.7 % — AB (ref 11.0–15.0)
TOTAL LYMPHOCYTE: 14.1 %
WBC: 7.4 10*3/uL (ref 3.8–10.8)

## 2018-01-20 LAB — BASIC METABOLIC PANEL
BUN: 12 mg/dL (ref 7–25)
CALCIUM: 9.1 mg/dL (ref 8.6–10.4)
CO2: 30 mmol/L (ref 20–32)
Chloride: 93 mmol/L — ABNORMAL LOW (ref 98–110)
Creat: 0.75 mg/dL (ref 0.60–0.93)
GLUCOSE: 77 mg/dL (ref 65–99)
Potassium: 4 mmol/L (ref 3.5–5.3)
Sodium: 134 mmol/L — ABNORMAL LOW (ref 135–146)

## 2018-01-20 LAB — C-REACTIVE PROTEIN: CRP: 3.3 mg/L (ref <8.0)

## 2018-01-20 LAB — SEDIMENTATION RATE: Sed Rate: 36 mm/h — ABNORMAL HIGH (ref 0–30)

## 2018-01-22 NOTE — Progress Notes (Signed)
Subjective: 73 year old female presents the office today for follow-up evaluation of right hallux ulceration, cellulitis.  She recently just went for ultrasound today and she has a follow-up appointment with Dr. Gwenlyn Found next week.  She is started antibiotics over the last couple days they have noticed the redness has been improving denies any red streaks and denies any drainage or pus.  She having no pain.  She is continue the surgical shoe and offloading.  They been changing the dressing daily with mupirocin ointment.  Denies any systemic complaints such as fevers, chills, nausea, vomiting. No acute changes since last appointment, and no other complaints at this time.   Objective: AAO x3, NAD DP/PT pulses decreased bilaterally, CRT less than 3 seconds Ulcerations continues on the right hallux.  The wounds are the same in diameter.  Only to check her back today given the cellulitis.  Overall the erythema is improving and there is no ascending cellulitis.  There is no fluctuation or crepitation or any malodor.  Mild swelling to the foot compared to the contralateral extremity. No open lesions or pre-ulcerative lesions.  No pain with calf compression, swelling, warmth, erythema        Assessment: Ulceration right hallux with improving cellulitis  Plan: -All treatment options discussed with the patient including all alternatives, risks, complications.  -Overall the cellulitis is improving.  Continue with antibiotics.  Continue with daily dressing changes.  Continue with offloading at all times.  She got her blood work done today and I will call her with the results once I receive them.  Follow-up with Dr. Alvester Chou as scheduled.  Concern for amputation needed if not healing.  We can discuss this today. -Monitoring signs or symptoms of worsening infection go to the ER immediately should any occur.  Trula Slade DPM

## 2018-01-24 ENCOUNTER — Encounter: Payer: Self-pay | Admitting: Cardiovascular Disease

## 2018-01-24 ENCOUNTER — Ambulatory Visit: Payer: Medicare HMO | Admitting: Cardiovascular Disease

## 2018-01-24 ENCOUNTER — Telehealth: Payer: Self-pay | Admitting: *Deleted

## 2018-01-24 ENCOUNTER — Telehealth: Payer: Self-pay | Admitting: Cardiovascular Disease

## 2018-01-24 DIAGNOSIS — I998 Other disorder of circulatory system: Secondary | ICD-10-CM

## 2018-01-24 DIAGNOSIS — I739 Peripheral vascular disease, unspecified: Secondary | ICD-10-CM | POA: Diagnosis not present

## 2018-01-24 DIAGNOSIS — I70229 Atherosclerosis of native arteries of extremities with rest pain, unspecified extremity: Secondary | ICD-10-CM

## 2018-01-24 DIAGNOSIS — Z959 Presence of cardiac and vascular implant and graft, unspecified: Secondary | ICD-10-CM

## 2018-01-24 DIAGNOSIS — Z9889 Other specified postprocedural states: Secondary | ICD-10-CM

## 2018-01-24 NOTE — Addendum Note (Signed)
Addended by: Annita Brod on: 01/24/2018 03:07 PM   Modules accepted: Orders

## 2018-01-24 NOTE — Telephone Encounter (Signed)
-----   Message from Trula Slade, DPM sent at 01/23/2018  7:29 AM EST ----- Val- please let her know that her blood work is stable. Her sed rate is slightly elevated but otherwise normal. Thanks.

## 2018-01-24 NOTE — Telephone Encounter (Signed)
Called the listed phone number and answering female voice hung up the phone.

## 2018-01-24 NOTE — Telephone Encounter (Signed)
New Message   Pt c/o Shortness Of Breath: STAT if SOB developed within the last 24 hours or pt is noticeably SOB on the phone  1. Are you currently SOB (can you hear that pt is SOB on the phone)? Yes  2. How long have you been experiencing SOB? Saturday 01/21/18   3. Are you SOB when sitting or when up moving around? Both  4. Are you currently experiencing any other symptoms? Infected foot

## 2018-01-24 NOTE — Telephone Encounter (Signed)
Called patient's son, he states that the patient is having some SOB issues today. He denies any other symptoms, denies CP, Swelling, patient's son does mention that his mother has an infected foot, and was coming to see Dr.Berry for the arteries in her legs to see about her feet, but states that she is wearing her O2 now, and did a neb treatment, and states that her O2 level was 98-99%. Son asking if patient should be taken to the hospital, which I advised was his decision at that time. Son then states that he will bring her to her appointment and if the SOB worsens before the appointment he will take the ED.  I will send to Dr.Berry's covering nurse that is with him today to make her aware of this phone call. Thanks!

## 2018-01-24 NOTE — Progress Notes (Signed)
01/24/2018 [name]   June 26, 1945  [id number]  Primary Physician Tammy Amel, MD Primary Cardiologist: Tammy Harp MD [name], [place]  HPI:  [name] is a 73 y.o.  thin appearing widowed Caucasian female mother of 2 living sons with 1 daughter this deceased, grandmother of 5 grandchildren referred by Dr. Jacqualyn Lane, her podiatrist, for evaluation of critical limb ischemia.  I last saw her in the office 12/21/2017. Risk factors include greater than 100 pack years of tobacco abuse currently smoking a pack a day, treated diabetes, hypertension and hyperlipidemia.  She does have COPD.  She is never had a heart attack or stroke.  She does have peripheral neuropathy.  She is had a wound on the plantar surface of her right hallux for several months and has been seeing Dr. Jacqualyn Lane recently for evaluation of this.  Recent arterial segmental pressures performed 11/04/2017 revealed a right ABI 0.6 and a left ABI of 0.89.  She did have monophasic tibial waveforms in her common femoral and SFA/popliteal vessels suggesting a more proximal lesion which was confirmed on aortoiliac Doppler study.  Dr. Jacqualyn Lane saw her as recently as a week ago and demonstrated improvement in the redness but still fairly deep wound on her right hallux and suggested the possibility of amputation which certainly would not heal with this degree of circulation.  Current Meds  Medication Sig  . albuterol (PROVENTIL) (2.5 MG/3ML) 0.083% nebulizer solution Take 3 mLs (2.5 mg total) by nebulization every 2 (two) hours as needed for wheezing or shortness of breath.  Marland Kitchen amLODipine-valsartan (EXFORGE) 5-160 MG tablet Take 1 tablet by mouth daily.  Marland Kitchen aspirin EC 81 MG tablet Take 81 mg by mouth daily.  Marland Kitchen atorvastatin (LIPITOR) 20 MG tablet Take 20 mg by mouth every evening.  Marland Kitchen BYETTA 10 MCG PEN 10 MCG/0.04ML SOPN injection Inject 10 mcg into the skin 2 (two) times daily.  . ciprofloxacin (CIPRO) 500 MG tablet  Take 1 tablet (500 mg total) by mouth 2 (two) times daily.  . clindamycin (CLEOCIN) 300 MG capsule Take 1 capsule (300 mg total) by mouth 3 (three) times daily.  . diazepam (VALIUM) 5 MG tablet TAKE 1 TABLET BY MOUTH 1 TO 2 TIMES DAILY AS NEEDED  . ipratropium (ATROVENT HFA) 17 MCG/ACT inhaler Inhale 2 puffs into the lungs every 6 (six) hours as needed for wheezing.  . methocarbamol (ROBAXIN) 500 MG tablet Take 1 tablet (500 mg total) by mouth every 8 (eight) hours as needed for muscle spasms.  Marland Kitchen omeprazole (PRILOSEC) 20 MG capsule Take 1 tablet by mouth daily.  Marland Kitchen PARoxetine (PAXIL) 30 MG tablet Take 30 mg by mouth daily.   . silver sulfADIAZINE (SILVADENE) 1 % cream Apply 1 application topically daily.  . sitaGLIPtan-metformin (JANUMET) 50-1000 MG per tablet Take 1 tablet by mouth 2 (two) times daily with a meal.    . SYMBICORT 160-4.5 MCG/ACT inhaler Inhale 2 puffs into the lungs 2 (two) times daily.     No Known Allergies  Social History   Socioeconomic History  . Marital status: Widowed    Spouse name: Not on file  . Number of children: Not on file  . Years of education: Not on file  . Highest education level: Not on file  Occupational History    Comment: Worked in home  Social Needs  . Financial resource strain: Not on file  . Food insecurity:    Worry: Not on file    Inability:  Not on file  . Transportation needs:    Medical: Not on file    Non-medical: Not on file  Tobacco Use  . Smoking status: Current Every Day Smoker    Packs/day: 1.00    Years: 52.00    Pack years: 52.00  . Smokeless tobacco: Never Used  Substance and Sexual Activity  . Alcohol use: Not on file  . Drug use: Not on file  . Sexual activity: Not on file  Lifestyle  . Physical activity:    Days per week: Not on file    Minutes per session: Not on file  . Stress: Not on file  Relationships  . Social connections:    Talks on phone: Not on file    Gets together: Not on file    Attends religious  service: Not on file    Active member of club or organization: Not on file    Attends meetings of clubs or organizations: Not on file    Relationship status: Not on file  . Intimate partner violence:    Fear of current or ex partner: Not on file    Emotionally abused: Not on file    Physically abused: Not on file    Forced sexual activity: Not on file  Other Topics Concern  . Not on file  Social History Narrative  . Not on file     Review of Systems: General: negative for chills, fever, night sweats or weight changes.  Cardiovascular: negative for chest pain, dyspnea on exertion, edema, orthopnea, palpitations, paroxysmal nocturnal dyspnea or shortness of breath Dermatological: negative for rash Respiratory: negative for cough or wheezing Urologic: negative for hematuria Abdominal: negative for nausea, vomiting, diarrhea, bright red blood per rectum, melena, or hematemesis Neurologic: negative for visual changes, syncope, or dizziness All other systems reviewed and are otherwise negative except as noted above.    Blood pressure (!) 111/58, pulse 92, height 5\' 8"  (1.727 m), weight 159 lb (72.1 kg).  General appearance: alert and no distress Neck: no adenopathy, no carotid bruit, no JVD, supple, symmetrical, trachea midline and thyroid not enlarged, symmetric, no tenderness/mass/nodules Lungs: clear to auscultation bilaterally Heart: regular rate and rhythm, S1, S2 normal, no murmur, click, rub or gallop Extremities: extremities normal, atraumatic, no cyanosis or edema Pulses: Diminished pedal pulses bilaterally Skin: Skin color, texture, turgor normal. No rashes or lesions Neurologic: Alert and oriented X 3, normal strength and tone. Normal symmetric reflexes. Normal coordination and gait  EKG not performed today  ASSESSMENT AND PLAN:   Critical limb ischemia with history of revascularization of same extremity ` [name] returns today for follow-up.  She has a fairly  large wound on her right hallux.  Her ABI on that side is point 6 0 with monophasic waveforms in the femoral down suggesting a more proximal lesion.  Aortoiliac Dopplers confirmed the potential of an iliac stenosis.  She may require amputation of that toe which would certainly not heal with her current circulation.  She is had this wound for months which is made very slow progress in healing according to Dr. Jacqualyn Lane.  I am going arrange for her to undergo angiography and potential endovascular therapy for limb preservation.      Tammy Harp MD FACP,FACC,FAHA, [facility] 01/24/2018 2:31 PM

## 2018-01-24 NOTE — Assessment & Plan Note (Signed)
`   Ms. Cumpton returns today for follow-up.  She has a fairly large wound on her right hallux.  Her ABI on that side is point 6 0 with monophasic waveforms in the femoral down suggesting a more proximal lesion.  Aortoiliac Dopplers confirmed the potential of an iliac stenosis.  She may require amputation of that toe which would certainly not heal with her current circulation.  She is had this wound for months which is made very slow progress in healing according to Dr. Jacqualyn Posey.  I am going arrange for her to undergo angiography and potential endovascular therapy for limb preservation.

## 2018-01-24 NOTE — Patient Instructions (Addendum)
    [name] Dept: [phone] Loc: [place]  01/24/2018  You are scheduled for a Peripheral Angiogram on Monday, January 27 with Dr. Quay Burow.  1. Please arrive at the [facility] (Main Entrance A) at [facility]: [address] [place], [place] [address] at 5:30 AM (This time is two hours before your procedure to ensure your preparation). Free valet parking service is available.   Special note: Every effort is made to have your procedure done on time. Please understand that emergencies sometimes delay scheduled procedures.  2. Diet: Do not eat solid foods after midnight.  The patient may have clear liquids until 5am upon the day of the procedure.  3. Labs: You will need to have blood drawn on today. CBC, BMP, TSH 4. Medication instructions in preparation for your procedure:    *For reference purposes while preparing patient instructions.   Delete this med list prior to printing instructions for patient.*     Do not take Diabetes Med Janumet (Metformin + Sitagliptin) on the day of the procedure and HOLD 48 HOURS AFTER THE PROCEDURE.   Do not take your Byetta on the morning of the procedure.  On the morning of your procedure, take your Aspirin and any morning medicines NOT listed above.  You may use sips of water.  5. Plan for one night stay--bring personal belongings. 6. Bring a current list of your medications and current insurance cards. 7. You MUST have a responsible person to drive you home. 8. Someone MUST be with you the first 24 hours after you arrive home or your discharge will be delayed. 9. Please wear clothes that are easy to get on and off and wear slip-on shoes.  Thank you for allowing [place] to care for you!   -- [facility]   TESTS: Your physician has requested that  you have an aorta/iliac duplex. During this test, an ultrasound is used to evaluate blood flow to the aorta and iliac arteries. Allow one hour for this exam. Do not eat after midnight the day before and avoid carbonated beverages  SCHEDULE IN 1-2 WEEKS POST PROCEDURE 1/27  FOLLOW UP APPOINTMENT IN 2-3 WEEKS POST PROCEDURE 1/27

## 2018-01-25 ENCOUNTER — Other Ambulatory Visit: Payer: Self-pay

## 2018-01-25 ENCOUNTER — Inpatient Hospital Stay (HOSPITAL_COMMUNITY)
Admission: EM | Admit: 2018-01-25 | Discharge: 2018-02-03 | DRG: 191 | Disposition: A | Discharge status: Skilled Nursing Facility | Payer: Medicare HMO | Attending: Internal Medicine | Admitting: Internal Medicine

## 2018-01-25 ENCOUNTER — Telehealth: Payer: Self-pay

## 2018-01-25 ENCOUNTER — Encounter (HOSPITAL_COMMUNITY): Payer: Self-pay | Admitting: *Deleted

## 2018-01-25 DIAGNOSIS — Z23 Encounter for immunization: Secondary | ICD-10-CM | POA: Diagnosis not present

## 2018-01-25 DIAGNOSIS — E1151 Type 2 diabetes mellitus with diabetic peripheral angiopathy without gangrene: Secondary | ICD-10-CM | POA: Diagnosis present

## 2018-01-25 DIAGNOSIS — J441 Chronic obstructive pulmonary disease with (acute) exacerbation: Secondary | ICD-10-CM

## 2018-01-25 DIAGNOSIS — R5381 Other malaise: Secondary | ICD-10-CM | POA: Diagnosis present

## 2018-01-25 DIAGNOSIS — E11621 Type 2 diabetes mellitus with foot ulcer: Secondary | ICD-10-CM | POA: Diagnosis present

## 2018-01-25 DIAGNOSIS — R0602 Shortness of breath: Secondary | ICD-10-CM | POA: Diagnosis not present

## 2018-01-25 DIAGNOSIS — E876 Hypokalemia: Secondary | ICD-10-CM | POA: Diagnosis present

## 2018-01-25 DIAGNOSIS — I739 Peripheral vascular disease, unspecified: Secondary | ICD-10-CM | POA: Diagnosis present

## 2018-01-25 DIAGNOSIS — Z6824 Body mass index (BMI) 24.0-24.9, adult: Secondary | ICD-10-CM

## 2018-01-25 DIAGNOSIS — L97509 Non-pressure chronic ulcer of other part of unspecified foot with unspecified severity: Secondary | ICD-10-CM

## 2018-01-25 DIAGNOSIS — E119 Type 2 diabetes mellitus without complications: Secondary | ICD-10-CM

## 2018-01-25 DIAGNOSIS — D649 Anemia, unspecified: Secondary | ICD-10-CM | POA: Diagnosis not present

## 2018-01-25 DIAGNOSIS — Z959 Presence of cardiac and vascular implant and graft, unspecified: Secondary | ICD-10-CM

## 2018-01-25 DIAGNOSIS — E440 Moderate protein-calorie malnutrition: Secondary | ICD-10-CM

## 2018-01-25 DIAGNOSIS — I70229 Atherosclerosis of native arteries of extremities with rest pain, unspecified extremity: Secondary | ICD-10-CM

## 2018-01-25 DIAGNOSIS — E785 Hyperlipidemia, unspecified: Secondary | ICD-10-CM | POA: Diagnosis present

## 2018-01-25 DIAGNOSIS — Z9981 Dependence on supplemental oxygen: Secondary | ICD-10-CM

## 2018-01-25 DIAGNOSIS — J9611 Chronic respiratory failure with hypoxia: Secondary | ICD-10-CM | POA: Diagnosis present

## 2018-01-25 DIAGNOSIS — E871 Hypo-osmolality and hyponatremia: Secondary | ICD-10-CM | POA: Diagnosis present

## 2018-01-25 DIAGNOSIS — J9610 Chronic respiratory failure, unspecified whether with hypoxia or hypercapnia: Secondary | ICD-10-CM | POA: Diagnosis present

## 2018-01-25 DIAGNOSIS — E1169 Type 2 diabetes mellitus with other specified complication: Secondary | ICD-10-CM | POA: Diagnosis present

## 2018-01-25 DIAGNOSIS — K219 Gastro-esophageal reflux disease without esophagitis: Secondary | ICD-10-CM | POA: Diagnosis present

## 2018-01-25 DIAGNOSIS — D509 Iron deficiency anemia, unspecified: Secondary | ICD-10-CM | POA: Diagnosis present

## 2018-01-25 DIAGNOSIS — Z794 Long term (current) use of insulin: Secondary | ICD-10-CM

## 2018-01-25 DIAGNOSIS — I10 Essential (primary) hypertension: Secondary | ICD-10-CM | POA: Diagnosis present

## 2018-01-25 DIAGNOSIS — Z9889 Other specified postprocedural states: Secondary | ICD-10-CM

## 2018-01-25 DIAGNOSIS — F17200 Nicotine dependence, unspecified, uncomplicated: Secondary | ICD-10-CM | POA: Diagnosis present

## 2018-01-25 DIAGNOSIS — I998 Other disorder of circulatory system: Secondary | ICD-10-CM

## 2018-01-25 LAB — COMPREHENSIVE METABOLIC PANEL
ALT: 17 U/L (ref 0–44)
ANION GAP: 11 (ref 5–15)
AST: 26 U/L (ref 15–41)
Albumin: 3.2 g/dL — ABNORMAL LOW (ref 3.5–5.0)
Alkaline Phosphatase: 51 U/L (ref 38–126)
BUN: 14 mg/dL (ref 8–23)
CO2: 29 mmol/L (ref 22–32)
Calcium: 8.4 mg/dL — ABNORMAL LOW (ref 8.9–10.3)
Chloride: 88 mmol/L — ABNORMAL LOW (ref 98–111)
Creatinine, Ser: 0.77 mg/dL (ref 0.44–1.00)
GFR calc Af Amer: >60 mL/min (ref >60)
GFR calc non Af Amer: >60 mL/min (ref >60)
Glucose, Bld: 107 mg/dL — ABNORMAL HIGH (ref 70–99)
Potassium: 3.3 mmol/L — ABNORMAL LOW (ref 3.5–5.1)
Sodium: 128 mmol/L — ABNORMAL LOW (ref 135–145)
TOTAL PROTEIN: 6.8 g/dL (ref 6.5–8.1)
Total Bilirubin: 0.4 mg/dL (ref 0.3–1.2)

## 2018-01-25 LAB — CBC
HCT: 24.3 % — ABNORMAL LOW (ref 36.0–46.0)
Hematocrit: 21.9 % — ABNORMAL LOW (ref 34.0–46.6)
Hemoglobin: 6.8 g/dL — CL (ref 11.1–15.9)
Hemoglobin: 7.2 g/dL — ABNORMAL LOW (ref 12.0–15.0)
MCH: 21.5 pg — ABNORMAL LOW (ref 26.6–33.0)
MCH: 21.9 pg — ABNORMAL LOW (ref 26.0–34.0)
MCHC: 31.1 g/dL — ABNORMAL LOW (ref 31.5–35.7)
MCHC: 29.6 g/dL — ABNORMAL LOW (ref 30.0–36.0)
MCV: 69 fL — ABNORMAL LOW (ref 79–97)
MCV: 73.9 fL — ABNORMAL LOW (ref 80.0–100.0)
PLATELETS: 184 10*3/uL (ref 150–450)
Platelets: 178 10*3/uL (ref 150–400)
RBC: 3.16 x10E6/uL — ABNORMAL LOW (ref 3.77–5.28)
RBC: 3.29 MIL/uL — ABNORMAL LOW (ref 3.87–5.11)
RDW: 15.6 % — AB (ref 11.7–15.4)
RDW: 17.2 % — ABNORMAL HIGH (ref 11.5–15.5)
WBC: 3.7 10*3/uL (ref 3.4–10.8)
WBC: 5.4 10*3/uL (ref 4.0–10.5)
nRBC: 0 % (ref 0.0–0.2)

## 2018-01-25 LAB — BASIC METABOLIC PANEL
BUN/Creatinine Ratio: 13 (ref 12–28)
BUN: 10 mg/dL (ref 8–27)
CHLORIDE: 87 mmol/L — AB (ref 96–106)
CO2: 25 mmol/L (ref 20–29)
Calcium: 8.3 mg/dL — ABNORMAL LOW (ref 8.7–10.3)
Creatinine, Ser: 0.75 mg/dL (ref 0.57–1.00)
GFR calc Af Amer: 93 mL/min/{1.73_m2} (ref >59)
GFR calc non Af Amer: 80 mL/min/{1.73_m2} (ref >59)
Glucose: 80 mg/dL (ref 65–99)
Potassium: 3.4 mmol/L — ABNORMAL LOW (ref 3.5–5.2)
Sodium: 130 mmol/L — ABNORMAL LOW (ref 134–144)

## 2018-01-25 LAB — TSH: TSH: 1.14 u[IU]/mL (ref 0.450–4.500)

## 2018-01-25 LAB — ABO/RH: ABO/RH(D): O POS

## 2018-01-25 NOTE — Telephone Encounter (Signed)
LMTCB

## 2018-01-25 NOTE — Telephone Encounter (Signed)
CRITICAL VALUE STICKER  CRITICAL VALUE: Hemoglobin 6.8  RECEIVER (on-site recipient of call): Maxim Bedel  DATE & TIME NOTIFIED: 01/25/2018; 0825  MESSENGER (representative from lab): unknown; [place]  MD NOTIFIED: Buford Dresser, MD  TIME OF NOTIFICATION: 252  RESPONSE: contact pt and advise to report to ED

## 2018-01-25 NOTE — Telephone Encounter (Signed)
DPR on file. Spoke with pt brother and advised that he take the pt to ED for further evaluation d/t critical lab of hemoglobin 6.8. Pt brother states that it may take him up to 2 hours to report to ED with pt and questioned if they would have to wait very long to be seen. Informed pt that a call would be placed to triage in [facility] ED to make them aware that she would be presenting to ED today. Pt brother agreeable with this and verbalized understanding

## 2018-01-25 NOTE — Telephone Encounter (Signed)
LMTCB -second attempt.

## 2018-01-25 NOTE — ED Triage Notes (Signed)
Pt went to see Dr. Gwenlyn Found yesterday for pre op visit for angiography next week. He called today and reported to her her HGB level was low and go to the ED. She denies pain, no blood in stool, no change in her SOB.

## 2018-01-26 ENCOUNTER — Other Ambulatory Visit: Payer: Self-pay

## 2018-01-26 ENCOUNTER — Emergency Department (HOSPITAL_COMMUNITY): Payer: Medicare HMO

## 2018-01-26 ENCOUNTER — Encounter: Payer: Self-pay | Admitting: Cardiovascular Disease

## 2018-01-26 ENCOUNTER — Encounter (HOSPITAL_COMMUNITY): Payer: Self-pay | Admitting: Internal Medicine

## 2018-01-26 DIAGNOSIS — F17200 Nicotine dependence, unspecified, uncomplicated: Secondary | ICD-10-CM

## 2018-01-26 DIAGNOSIS — I739 Peripheral vascular disease, unspecified: Secondary | ICD-10-CM | POA: Diagnosis not present

## 2018-01-26 DIAGNOSIS — E1169 Type 2 diabetes mellitus with other specified complication: Secondary | ICD-10-CM | POA: Diagnosis not present

## 2018-01-26 DIAGNOSIS — E11621 Type 2 diabetes mellitus with foot ulcer: Secondary | ICD-10-CM

## 2018-01-26 DIAGNOSIS — E876 Hypokalemia: Secondary | ICD-10-CM

## 2018-01-26 DIAGNOSIS — E440 Moderate protein-calorie malnutrition: Secondary | ICD-10-CM | POA: Diagnosis not present

## 2018-01-26 DIAGNOSIS — D508 Other iron deficiency anemias: Secondary | ICD-10-CM | POA: Diagnosis not present

## 2018-01-26 DIAGNOSIS — R0602 Shortness of breath: Secondary | ICD-10-CM | POA: Diagnosis not present

## 2018-01-26 DIAGNOSIS — I998 Other disorder of circulatory system: Secondary | ICD-10-CM

## 2018-01-26 DIAGNOSIS — Z9889 Other specified postprocedural states: Secondary | ICD-10-CM

## 2018-01-26 DIAGNOSIS — E871 Hypo-osmolality and hyponatremia: Secondary | ICD-10-CM | POA: Diagnosis not present

## 2018-01-26 DIAGNOSIS — Z9981 Dependence on supplemental oxygen: Secondary | ICD-10-CM | POA: Diagnosis not present

## 2018-01-26 DIAGNOSIS — J984 Other disorders of lung: Secondary | ICD-10-CM | POA: Diagnosis not present

## 2018-01-26 DIAGNOSIS — I10 Essential (primary) hypertension: Secondary | ICD-10-CM | POA: Diagnosis not present

## 2018-01-26 DIAGNOSIS — Z794 Long term (current) use of insulin: Secondary | ICD-10-CM

## 2018-01-26 DIAGNOSIS — Z959 Presence of cardiac and vascular implant and graft, unspecified: Secondary | ICD-10-CM | POA: Diagnosis not present

## 2018-01-26 DIAGNOSIS — J9611 Chronic respiratory failure with hypoxia: Secondary | ICD-10-CM | POA: Diagnosis not present

## 2018-01-26 DIAGNOSIS — Z6824 Body mass index (BMI) 24.0-24.9, adult: Secondary | ICD-10-CM | POA: Diagnosis not present

## 2018-01-26 DIAGNOSIS — E119 Type 2 diabetes mellitus without complications: Secondary | ICD-10-CM | POA: Diagnosis not present

## 2018-01-26 DIAGNOSIS — D649 Anemia, unspecified: Secondary | ICD-10-CM | POA: Diagnosis present

## 2018-01-26 DIAGNOSIS — E785 Hyperlipidemia, unspecified: Secondary | ICD-10-CM | POA: Diagnosis not present

## 2018-01-26 DIAGNOSIS — E1151 Type 2 diabetes mellitus with diabetic peripheral angiopathy without gangrene: Secondary | ICD-10-CM | POA: Diagnosis not present

## 2018-01-26 DIAGNOSIS — D509 Iron deficiency anemia, unspecified: Secondary | ICD-10-CM | POA: Diagnosis present

## 2018-01-26 DIAGNOSIS — K219 Gastro-esophageal reflux disease without esophagitis: Secondary | ICD-10-CM | POA: Diagnosis present

## 2018-01-26 DIAGNOSIS — R5381 Other malaise: Secondary | ICD-10-CM | POA: Diagnosis not present

## 2018-01-26 DIAGNOSIS — Z743 Need for continuous supervision: Secondary | ICD-10-CM | POA: Diagnosis not present

## 2018-01-26 DIAGNOSIS — J449 Chronic obstructive pulmonary disease, unspecified: Secondary | ICD-10-CM | POA: Diagnosis not present

## 2018-01-26 DIAGNOSIS — Z23 Encounter for immunization: Secondary | ICD-10-CM | POA: Diagnosis not present

## 2018-01-26 DIAGNOSIS — J441 Chronic obstructive pulmonary disease with (acute) exacerbation: Secondary | ICD-10-CM | POA: Diagnosis not present

## 2018-01-26 DIAGNOSIS — R279 Unspecified lack of coordination: Secondary | ICD-10-CM | POA: Diagnosis not present

## 2018-01-26 DIAGNOSIS — L97509 Non-pressure chronic ulcer of other part of unspecified foot with unspecified severity: Secondary | ICD-10-CM

## 2018-01-26 DIAGNOSIS — R69 Illness, unspecified: Secondary | ICD-10-CM | POA: Diagnosis not present

## 2018-01-26 DIAGNOSIS — J9610 Chronic respiratory failure, unspecified whether with hypoxia or hypercapnia: Secondary | ICD-10-CM | POA: Diagnosis not present

## 2018-01-26 LAB — GLUCOSE, CAPILLARY
Glucose-Capillary: 152 mg/dL — ABNORMAL HIGH (ref 70–99)
Glucose-Capillary: 148 mg/dL — ABNORMAL HIGH (ref 70–99)

## 2018-01-26 LAB — PREPARE RBC (CROSSMATCH)

## 2018-01-26 LAB — POC OCCULT BLOOD, ED: Fecal Occult Bld: NEGATIVE

## 2018-01-26 MED ORDER — SODIUM CHLORIDE 0.9 % IV SOLN: 25.0000 mg | Freq: Once | INTRAVENOUS | Status: DC

## 2018-01-26 MED ORDER — DOXYCYCLINE HYCLATE 100 MG PO TABS
100.0000 mg | ORAL_TABLET | Freq: Two times a day (BID) | ORAL | Status: AC
  Administered 2018-01-26 – 2018-01-30 (×10): 100 mg via ORAL
  Filled 2018-01-26 (×10): qty 1

## 2018-01-26 MED ORDER — PANTOPRAZOLE SODIUM 40 MG PO TBEC
40.0000 mg | DELAYED_RELEASE_TABLET | Freq: Every day | ORAL | Status: DC
  Administered 2018-01-26 – 2018-02-03 (×9): 40 mg via ORAL
  Filled 2018-01-26 (×9): qty 1

## 2018-01-26 MED ORDER — POTASSIUM CHLORIDE CRYS ER 20 MEQ PO TBCR
40.0000 meq | EXTENDED_RELEASE_TABLET | Freq: Once | ORAL | Status: AC
  Administered 2018-01-26: 40 meq via ORAL
  Filled 2018-01-26: qty 2

## 2018-01-26 MED ORDER — ASPIRIN EC 81 MG PO TBEC
81.0000 mg | DELAYED_RELEASE_TABLET | Freq: Every day | ORAL | Status: DC
  Administered 2018-01-26 – 2018-02-03 (×9): 81 mg via ORAL
  Filled 2018-01-26 (×9): qty 1

## 2018-01-26 MED ORDER — SODIUM CHLORIDE 0.9% IV SOLUTION
Freq: Once | INTRAVENOUS | Status: AC
  Administered 2018-01-26: 01:00:00 via INTRAVENOUS

## 2018-01-26 MED ORDER — NICOTINE 14 MG/24HR TD PT24
14.0000 mg | MEDICATED_PATCH | Freq: Every day | TRANSDERMAL | Status: DC
  Administered 2018-01-26 – 2018-01-27 (×2): 14 mg via TRANSDERMAL
  Filled 2018-01-26 (×7): qty 1

## 2018-01-26 MED ORDER — HEPARIN SODIUM (PORCINE) 5000 UNIT/ML IJ SOLN
5000.0000 [IU] | Freq: Three times a day (TID) | INTRAMUSCULAR | Status: DC
  Administered 2018-01-26 – 2018-02-03 (×26): 5000 [IU] via SUBCUTANEOUS
  Filled 2018-01-26 (×26): qty 1

## 2018-01-26 MED ORDER — AMLODIPINE BESYLATE-VALSARTAN 5-160 MG PO TABS: 1.0000 | ORAL_TABLET | Freq: Every day | ORAL | Status: DC

## 2018-01-26 MED ORDER — SODIUM CHLORIDE 0.9 % IV SOLN: 100.0000 mg | Freq: Once | INTRAVENOUS | Status: DC

## 2018-01-26 MED ORDER — SODIUM CHLORIDE 0.9 % IV SOLN
510.0000 mg | Freq: Once | INTRAVENOUS | Status: AC
  Administered 2018-01-26: 510 mg via INTRAVENOUS
  Filled 2018-01-26: qty 17

## 2018-01-26 MED ORDER — IPRATROPIUM-ALBUTEROL 0.5-2.5 (3) MG/3ML IN SOLN
3.0000 mL | Freq: Once | RESPIRATORY_TRACT | Status: AC
  Administered 2018-01-26: 3 mL via RESPIRATORY_TRACT
  Filled 2018-01-26: qty 3

## 2018-01-26 MED ORDER — ACETAMINOPHEN 325 MG PO TABS: 650.0000 mg | ORAL_TABLET | Freq: Four times a day (QID) | ORAL | Status: DC | PRN

## 2018-01-26 MED ORDER — FLUTICASONE FUROATE-VILANTEROL 200-25 MCG/INH IN AEPB
1.0000 | INHALATION_SPRAY | Freq: Every day | RESPIRATORY_TRACT | Status: DC
  Administered 2018-01-28 – 2018-02-03 (×6): 1 via RESPIRATORY_TRACT
  Filled 2018-01-26: qty 28

## 2018-01-26 MED ORDER — ADULT MULTIVITAMIN W/MINERALS CH
1.0000 | ORAL_TABLET | Freq: Every day | ORAL | Status: DC
  Administered 2018-01-26 – 2018-02-03 (×9): 1 via ORAL
  Filled 2018-01-26 (×9): qty 1

## 2018-01-26 MED ORDER — ATORVASTATIN CALCIUM 10 MG PO TABS
20.0000 mg | ORAL_TABLET | Freq: Every evening | ORAL | Status: DC
  Administered 2018-01-26 – 2018-02-03 (×9): 20 mg via ORAL
  Filled 2018-01-26 (×9): qty 2

## 2018-01-26 MED ORDER — PREDNISONE 20 MG PO TABS
40.0000 mg | ORAL_TABLET | Freq: Every day | ORAL | Status: AC
  Administered 2018-01-27 – 2018-01-30 (×4): 40 mg via ORAL
  Filled 2018-01-26 (×4): qty 2

## 2018-01-26 MED ORDER — PRO-STAT SUGAR FREE PO LIQD
30.0000 mL | Freq: Two times a day (BID) | ORAL | Status: DC
  Administered 2018-01-26 – 2018-02-03 (×16): 30 mL via ORAL
  Filled 2018-01-26 (×17): qty 30

## 2018-01-26 MED ORDER — AMLODIPINE BESYLATE 5 MG PO TABS
5.0000 mg | ORAL_TABLET | Freq: Every day | ORAL | Status: DC
  Administered 2018-01-26 – 2018-02-03 (×9): 5 mg via ORAL
  Filled 2018-01-26 (×9): qty 1

## 2018-01-26 MED ORDER — IRBESARTAN 300 MG PO TABS
150.0000 mg | ORAL_TABLET | Freq: Every day | ORAL | Status: DC
  Administered 2018-01-26 – 2018-02-03 (×9): 150 mg via ORAL
  Filled 2018-01-26 (×9): qty 1

## 2018-01-26 MED ORDER — ONDANSETRON HCL 4 MG/2ML IJ SOLN: 4.0000 mg | Freq: Four times a day (QID) | INTRAMUSCULAR | Status: DC | PRN

## 2018-01-26 MED ORDER — ACETAMINOPHEN 650 MG RE SUPP: 650.0000 mg | Freq: Four times a day (QID) | RECTAL | Status: DC | PRN

## 2018-01-26 MED ORDER — ONDANSETRON HCL 4 MG PO TABS: 4.0000 mg | ORAL_TABLET | Freq: Four times a day (QID) | ORAL | Status: DC | PRN

## 2018-01-26 MED ORDER — METHOCARBAMOL 500 MG PO TABS: 500.0000 mg | ORAL_TABLET | Freq: Three times a day (TID) | ORAL | Status: DC | PRN

## 2018-01-26 MED ORDER — ALBUTEROL SULFATE (2.5 MG/3ML) 0.083% IN NEBU
2.5000 mg | INHALATION_SOLUTION | RESPIRATORY_TRACT | Status: DC | PRN
  Administered 2018-01-26 – 2018-01-28 (×4): 2.5 mg via RESPIRATORY_TRACT
  Filled 2018-01-26 (×4): qty 3

## 2018-01-26 MED ORDER — INSULIN ASPART 100 UNIT/ML ~~LOC~~ SOLN
0.0000 [IU] | Freq: Three times a day (TID) | SUBCUTANEOUS | Status: DC
  Administered 2018-01-26: 1 [IU] via SUBCUTANEOUS
  Administered 2018-01-27: 2 [IU] via SUBCUTANEOUS
  Administered 2018-01-27 – 2018-01-29 (×5): 1 [IU] via SUBCUTANEOUS
  Administered 2018-01-29 – 2018-01-30 (×2): 2 [IU] via SUBCUTANEOUS
  Administered 2018-01-30: 5 [IU] via SUBCUTANEOUS
  Administered 2018-01-31 – 2018-02-02 (×4): 1 [IU] via SUBCUTANEOUS

## 2018-01-26 MED ORDER — METHYLPREDNISOLONE SODIUM SUCC 125 MG IJ SOLR
60.0000 mg | Freq: Two times a day (BID) | INTRAMUSCULAR | Status: AC
  Administered 2018-01-26 – 2018-01-27 (×2): 60 mg via INTRAVENOUS
  Filled 2018-01-26 (×2): qty 2

## 2018-01-26 MED ORDER — METHYLPREDNISOLONE SODIUM SUCC 125 MG IJ SOLR
125.0000 mg | Freq: Once | INTRAMUSCULAR | Status: AC
  Administered 2018-01-26: 125 mg via INTRAVENOUS
  Filled 2018-01-26: qty 2

## 2018-01-26 MED ORDER — GLUCERNA SHAKE PO LIQD
237.0000 mL | Freq: Two times a day (BID) | ORAL | Status: DC
  Administered 2018-01-26 – 2018-02-02 (×12): 237 mL via ORAL

## 2018-01-26 MED ORDER — EXENATIDE 10 MCG/0.04ML ~~LOC~~ SOPN: 10.0000 ug | PEN_INJECTOR | Freq: Two times a day (BID) | SUBCUTANEOUS | Status: DC

## 2018-01-26 MED ORDER — DIAZEPAM 5 MG PO TABS
5.0000 mg | ORAL_TABLET | Freq: Two times a day (BID) | ORAL | Status: DC | PRN
  Administered 2018-01-27 – 2018-02-03 (×5): 5 mg via ORAL
  Filled 2018-01-26 (×5): qty 1

## 2018-01-26 MED ORDER — INSULIN ASPART 100 UNIT/ML ~~LOC~~ SOLN
0.0000 [IU] | Freq: Every day | SUBCUTANEOUS | Status: DC
  Administered 2018-02-02: 0 [IU] via SUBCUTANEOUS

## 2018-01-26 MED ORDER — PAROXETINE HCL 30 MG PO TABS
30.0000 mg | ORAL_TABLET | Freq: Every day | ORAL | Status: DC
  Administered 2018-01-26 – 2018-02-03 (×9): 30 mg via ORAL
  Filled 2018-01-26 (×10): qty 1

## 2018-01-26 NOTE — Telephone Encounter (Signed)
-----   Message from Trula Slade, DPM sent at 01/23/2018  7:29 AM EST ----- Val- please let her know that her blood work is stable. Her sed rate is slightly elevated but otherwise normal. Thanks.

## 2018-01-26 NOTE — ED Notes (Signed)
Patient transported to X-ray 

## 2018-01-26 NOTE — Telephone Encounter (Signed)
Pt's brother, Gordan Payment states pt went Monday to Dr. Gwenlyn Found and he informed her that if she felt sick it was from the foot, and [name] took her to the ED due to very low blood count, but her foot looks good. [name] states pt smokes and smokes and smokes, that she goes to the hospital they do some magic and she goes home and starts over again, pt may pitch a fit and get released and be at Dr.Wagoner's appt tomorrow.

## 2018-01-26 NOTE — Evaluation (Signed)
Physical Therapy Evaluation Patient Details Name: [name] MRN: [id number] DOB: [birth year] Today's Date: 01/26/2018   History of Present Illness   73 y.o. female with medical history significant of COPD with chronic hypoxic failure on 4 L oxygen at home, smoker, severe peripheral vascular disease, hypertension, diabetes on oral hypoglycemics and recently developed ischemic ulcer on her foot who presents to the emergency room after having a routine evaluation at cardiology office and found to have hemoglobin of 6.8.  Clinical Impression  Pt admitted with above diagnosis. Pt currently with functional limitations due to the deficits listed below (see PT Problem List). Min/guard assist for bed to recliner transfer with RW, SaO2 dropped to 84% on 4L O2 with transfer, RR 32, ambulation deferred 2* dyspnea/hypoxia with minimal activity.  Pt will benefit from skilled PT to increase their independence and safety with mobility to allow discharge to the venue listed below.       Follow Up Recommendations Home health PT    Equipment Recommendations  None recommended by PT    Recommendations for Other Services       Precautions / Restrictions Precautions Precautions: Other (comment) Precaution Comments: monitor O2 Restrictions Weight Bearing Restrictions: No      Mobility  Bed Mobility Overal bed mobility: Modified Independent             General bed mobility comments: with bedrail, HOB up  Transfers Overall transfer level: Needs assistance Equipment used: Rolling walker (2 wheeled) Transfers: Sit to/from [facility] Sit to Stand: Min guard Stand pivot transfers: Min guard       General transfer comment: RR 32 at rest, SaO2 84% on 4L O2 [place] with activity, 97% on 4L at rest  Ambulation/Gait             General Gait Details: deferred 2* elevated RR and hypoxia with minimal activity  Stairs            Wheelchair Mobility    Modified  [place] (Stroke Patients Only)       Balance Overall balance assessment: Modified Independent                                           Pertinent Vitals/Pain Pain Assessment: No/denies pain    Home Living Family/patient expects to be discharged to:: Private residence Living Arrangements: Alone Available Help at Discharge: Family;Friend(s);Available PRN/intermittently Type of Home: House Home Access: Stairs to enter Entrance Stairs-Rails: Psychiatric nurse of Steps: 3 Home Layout: One level Home Equipment: Walker - 4 wheels;Bedside commode;Wheelchair - manual      Prior Function Level of Independence: Independent with assistive device(s)         Comments: uses rollator in home, WC for long distances; brother available to assist prn     Hand Dominance   Dominant Hand: Right    Extremity/Trunk Assessment   Upper Extremity Assessment Upper Extremity Assessment: Overall WFL for tasks assessed    Lower Extremity Assessment Lower Extremity Assessment: Overall WFL for tasks assessed    Cervical / Trunk Assessment Cervical / Trunk Assessment: Normal  Communication   Communication: No difficulties  Cognition Arousal/Alertness: Awake/alert Behavior During Therapy: WFL for tasks assessed/performed Overall Cognitive Status: Within Functional Limits for tasks assessed  General Comments      Exercises     Assessment/Plan    PT Assessment Patient needs continued PT services  PT Problem List Decreased mobility;Cardiopulmonary status limiting activity       PT Treatment Interventions Gait training;Functional mobility training;Therapeutic exercise;Patient/family education    PT Goals (Current goals can be found in the Care Plan section)  Acute Rehab PT Goals Patient Stated Goal: return home PT Goal Formulation: With patient Time For Goal Achievement: 02/09/18 Potential to  Achieve Goals: Good    Frequency Min 3X/week   Barriers to discharge        Co-evaluation               AM-PAC PT "6 Clicks" Mobility  Outcome Measure Help needed turning from your back to your side while in a flat bed without using bedrails?: A Little Help needed moving from lying on your back to sitting on the side of a flat bed without using bedrails?: A Little Help needed moving to and from a bed to a chair (including a wheelchair)?: A Little Help needed standing up from a chair using your arms (e.g., wheelchair or bedside chair)?: A Little Help needed to walk in hospital room?: Total Help needed climbing 3-5 steps with a railing? : Total 6 Click Score: 14    End of Session Equipment Utilized During Treatment: Gait belt;Oxygen Activity Tolerance: Patient limited by fatigue Patient left: in chair;with call bell/phone within reach Nurse Communication: Mobility status PT Visit Diagnosis: Difficulty in walking, not elsewhere classified (R26.2)    Time: 1250-1300 PT Time Calculation (min) (ACUTE ONLY): 10 min   Charges:   PT Evaluation $PT Eval Low Complexity: 1 Low         Philomena Doheny PT 01/26/2018  Acute Rehabilitation Services Pager [phone] Office [phone]

## 2018-01-26 NOTE — Progress Notes (Signed)
Patient trasfered from ED to [phone] via stretcher; alert and oriented x 4; no complaints of pain; 2 IV saline locked in LFA.. Orient patient to room and unit;  gave patient care guide; instructed how to use the call bell and  fall risk precautions. Will continue to monitor the patient.

## 2018-01-26 NOTE — Progress Notes (Addendum)
Occupational Therapy Evaluation Patient Details Name: [name] MRN: [id number] DOB: Dec 26, 1945 Today's Date: 01/26/2018    History of Present Illness  73 y.o. female with medical history significant of COPD with chronic hypoxic failure on 4 L oxygen at home, smoker, severe peripheral vascular disease, hypertension, diabetes on oral hypoglycemics and recently developed ischemic ulcer on her foot who presents to the emergency room after having a routine evaluation at cardiology office and found to have hemoglobin of 6.8.   Clinical Impression   PTA, pt lived at home alone and was modified independent with ADL and mobility using a rollator in the home and a wc in the community. Pt's brother is responsible for her medication and financial management. Pt able to stand EOB with O2 sats dropping to 91 on 4L, took 3 steps and had to end activity due to 4/4 dyspnea. Pt complaining of being dizzy and losing balance posteriorly. Pt unable to care for herself at this time due to deficits below. If pt progresses, she will be appropriate for HHOT if she has 24/7 S. Pt may need rehab at SNF to facilitate safe DC home. Will follow acutely.     Follow Up Recommendations  Home health OT;Supervision/Assistance - 24 hour(vs SNF pending progress)    Equipment Recommendations  None recommended by OT    Recommendations for Other Services       Precautions / Restrictions Precautions Precautions: Other (comment) Precaution Comments: monitor O2 Restrictions Weight Bearing Restrictions: No      Mobility Bed Mobility Overal bed mobility: Modified Independent             General bed mobility comments: with bedrail, HOB up  Transfers Overall transfer level: Needs assistance Equipment used: Rolling walker (2 wheeled) Transfers: Sit to/from [facility] Sit to Stand: Min assist Stand pivot transfers: Min guard       General transfer comment: LOB posteriorly with  standing    Balance Overall balance assessment: Needs assistance   Sitting balance-Leahy Scale: Good       Standing balance-Leahy Scale: Poor Standing balance comment: LOB posteriorly; feeling dizzy                           ADL either performed or assessed with clinical judgement   ADL Overall ADL's : Needs assistance/impaired     Grooming: Set up;Sitting   Upper Body Bathing: Set up;Sitting   Lower Body Bathing: Sit to/from stand;Moderate assistance   Upper Body Dressing : Set up;Sitting   Lower Body Dressing: Moderate assistance;Sit to/from stand   Toilet Transfer: Minimal assistance;Stand-pivot   Toileting- Water quality scientist and Hygiene: Min guard       Functional mobility during ADLs: Minimal assistance;Rolling walker General ADL Comments: Activity level significantly limited by dyspnea and complaitns of dizziness. unable to ambulate to bathroom at this time or complete bathing/dressing due to SOB. Will benefit from energy cosnervation strategies.      Vision         Perception     Praxis      Pertinent Vitals/Pain Pain Assessment: No/denies pain     Hand Dominance Right   Extremity/Trunk Assessment Upper Extremity Assessment Upper Extremity Assessment: Generalized weakness   Lower Extremity Assessment Lower Extremity Assessment: Defer to PT evaluation   Cervical / Trunk Assessment Cervical / Trunk Assessment: Normal   Communication Communication Communication: No difficulties   Cognition Arousal/Alertness: Awake/alert Behavior During Therapy: WFL for tasks assessed/performed Overall Cognitive  Status: Within Functional Limits for tasks assessed                                     General Comments       Exercises     Shoulder Instructions      Home Living Family/patient expects to be discharged to:: Private residence Living Arrangements: Alone Available Help at Discharge: Family;Friend(s);Available  PRN/intermittently Type of Home: House Home Access: Stairs to enter [employer] of Steps: 3 Entrance Stairs-Rails: Right;Left Home Layout: One level     Bathroom Shower/Tub: Occupational psychologist: Standard Bathroom Accessibility: No   Home Equipment: Environmental consultant - 4 wheels;Bedside commode;Wheelchair - manual          Prior Functioning/Environment Level of Independence: Independent with assistive device(s)        Comments: uses rollator in home, WC for long distances; brother available to assist prn        OT Problem List: Decreased strength;Decreased activity tolerance;Impaired balance (sitting and/or standing);Decreased safety awareness;Decreased knowledge of use of DME or AE;Cardiopulmonary status limiting activity      OT Treatment/Interventions: Self-care/ADL training;Therapeutic exercise;Energy conservation;Therapeutic activities;Patient/family education    OT Goals(Current goals can be found in the care plan section) Acute Rehab OT Goals Patient Stated Goal: to get better OT Goal Formulation: With patient Time For Goal Achievement: 02/09/18 Potential to Achieve Goals: Good  OT Frequency: Min 2X/week   Barriers to D/C:            Co-evaluation              AM-PAC OT "6 Clicks" Daily Activity     Outcome Measure Help from another person eating meals?: None Help from another person taking care of personal grooming?: A Little Help from another person toileting, which includes using toliet, bedpan, or urinal?: A Little Help from another person bathing (including washing, rinsing, drying)?: A Lot Help from another person to put on and taking off regular upper body clothing?: A Little Help from another person to put on and taking off regular lower body clothing?: A Lot 6 Click Score: 17   End of Session Equipment Utilized During Treatment: Oxygen(3L) Nurse Communication: Mobility status  Activity Tolerance: Patient limited by  fatigue Patient left: in bed;with call bell/phone within reach;with bed alarm set  OT Visit Diagnosis: Unsteadiness on feet (R26.81);Muscle weakness (generalized) (M62.81);Dizziness and giddiness (R42)                Time: 9977-4142 OT Time Calculation (min): 15 min Charges:  OT General Charges $OT Visit: 1 Visit OT Evaluation $OT Eval Moderate Complexity: [place], OT/L   Acute OT Clinical Specialist Acute Rehabilitation Services Pager [phone] Office [phone]   [facility] 01/26/2018, 3:52 PM

## 2018-01-26 NOTE — Telephone Encounter (Signed)
Spoke with pt's brother who states he was wanting to know if he sister could stay in the hospital until her surgery date on 1/27. He states he is concerned that she would go home smoke and not do the things to prepare for her surgery. Advise brother that with pt being in the hospital, he would have to express his concerns with pt's nurse and hospital MD as Dr. Gwenlyn Found couldn't make that determination. Brother verbalized understanding.

## 2018-01-26 NOTE — Telephone Encounter (Signed)
[name], patient's bother is calling to let Dr. Gwenlyn Found know of what is going on.  He states that his sister thinks she is going home today. He is wondering if they is any way she can stay in the hospital until her surgery.  He also wants to know when her surgery is.

## 2018-01-26 NOTE — Telephone Encounter (Signed)
  Returning call from [place]

## 2018-01-26 NOTE — ED Notes (Signed)
Attempted to call report to [facility] x 1 - floor will check to see if pt is appropriate

## 2018-01-26 NOTE — Progress Notes (Addendum)
Staff attemped to show the CCTV video with no results. The video is not working. Patient requested to talk with the doctor about the test and procedure before she will sign the consent. Dr. Sloan Leiter K was informed about patient's request. Patient will be NPO for solid foods after midnight and then she will be on clear liquid until 5 AM. After 5 AM - NPO. Will continue to monitor.

## 2018-01-26 NOTE — ED Provider Notes (Signed)
[facility] EMERGENCY DEPARTMENT Provider Note  CSN: [id number] Arrival date & time: 01/25/18 2030  Chief Complaint(s) Abnormal Lab  HPI [name] is a 73 y.o. female with a past medical history listed below including COPD on 4 L nasal cannula at home who presents for anemia.  Patient was seen by cardiology for preop evaluation and obtained labs revealing a hemoglobin of 6.8.  She was called earlier yesterday afternoon and instructed to present to the emergency department.  Patient denies any hematochezia or melena.  She endorses dyspnea on exertion for several weeks.  Denies any associated chest pain.  No nausea or vomiting.  No abdominal pain.    HPI  Past Medical History Past Medical History:  Diagnosis Date  . COPD (chronic obstructive pulmonary disease) ([place])   . Diabetes mellitus   . Diastolic dysfunction    history  . Hyperlipidemia   . Hypertension    Patient Active Problem List   Diagnosis Date Noted  . Critical limb ischemia with history of revascularization of same extremity 12/21/2017  . Hypomagnesemia 08/25/2017  . Bilateral back pain 08/24/2017  . Compression fracture of thoracic vertebra (H[facility]) 08/24/2017  . Hyponatremia 08/24/2017  . Hypokalemia 08/24/2017  . Fall 08/24/2017  . Microcytic anemia 08/24/2017  . Alcohol use 08/24/2017  . Depression 08/24/2017  . Dyslipidemia associated with type 2 diabetes mellitus ([place]) 04/28/2016  . Diabetes mellitus, type II, insulin dependent ([place]) 04/28/2016  . GERD without esophagitis 04/28/2016  . O2 dependent 04/28/2016  . Community acquired pneumonia   . Sepsis due to pneumonia ([place]) 04/18/2016  . COPD exacerbation ([place]) 12/04/2010  . Smoker 12/04/2010  . Essential hypertension 01/31/2008  . Chronic respiratory failure ([place]) 01/31/2008   Home Medication(s) Prior to Admission medications   Medication Sig Start Date End Date Taking? Authorizing Provider  albuterol (PROVENTIL) (2.5  MG/3ML) 0.083% nebulizer solution Take 3 mLs (2.5 mg total) by nebulization every 2 (two) hours as needed for wheezing or shortness of breath. 04/23/16   Ghimire, Henreitta Leber, MD  amLODipine-valsartan (EXFORGE) 5-160 MG tablet Take 1 tablet by mouth daily.    [provider]  aspirin EC 81 MG tablet Take 81 mg by mouth daily.    [provider]  atorvastatin (LIPITOR) 20 MG tablet Take 20 mg by mouth every evening.    [provider]  BYETTA 10 MCG PEN 10 MCG/0.04ML SOPN injection Inject 10 mcg into the skin 2 (two) times daily. 07/06/17   [provider]  ciprofloxacin (CIPRO) 500 MG tablet Take 1 tablet (500 mg total) by mouth 2 (two) times daily. 01/16/18   Trula Slade, DPM  clindamycin (CLEOCIN) 300 MG capsule Take 1 capsule (300 mg total) by mouth 3 (three) times daily. 01/16/18   Trula Slade, DPM  diazepam (VALIUM) 5 MG tablet TAKE 1 TABLET BY MOUTH 1 TO 2 TIMES DAILY AS NEEDED 09/13/17   [provider]  ipratropium (ATROVENT HFA) 17 MCG/ACT inhaler Inhale 2 puffs into the lungs every 6 (six) hours as needed for wheezing.    [provider]  methocarbamol (ROBAXIN) 500 MG tablet Take 1 tablet (500 mg total) by mouth every 8 (eight) hours as needed for muscle spasms. 08/27/17   Purohit, Konrad Dolores, MD  omeprazole (PRILOSEC) 20 MG capsule Take 1 tablet by mouth daily. 11/18/10   [provider]  PARoxetine (PAXIL) 30 MG tablet Take 30 mg by mouth daily.     [provider]  silver sulfADIAZINE (SILVADENE) 1 % cream Apply 1 application topically daily. 01/16/18   Trula Slade, DPM  sitaGLIPtan-metformin (JANUMET) 50-1000 MG per tablet Take 1 tablet by mouth 2 (two) times daily with a meal.      [provider]  SYMBICORT 160-4.5 MCG/ACT inhaler Inhale 2 puffs into the lungs 2 (two) times daily. 08/09/17   [provider]                                                                                                                                     Past Surgical History Past Surgical History:  Procedure Laterality Date  . TUBAL LIGATION     Family History Family History  Problem Relation Age of Onset  . Heart disease Father   . Lung cancer Father     Social History Social History   Tobacco Use  . Smoking status: Current Every Day Smoker    Packs/day: 1.00    Years: 52.00    Pack years: 52.00  . Smokeless tobacco: Never Used  Substance Use Topics  . Alcohol use: Not on file  . Drug use: Not on file   Allergies Patient has no known allergies.  Review of Systems Review of Systems All other systems are reviewed and are negative for acute change except as noted in the HPI  Physical Exam Vital Signs  I have reviewed the triage vital signs BP 139/71 (BP Location: Left Arm)   Pulse (!) 105   Temp 97.9 F (36.6 C) (Oral)   Resp 18   SpO2 100%   Physical Exam Vitals signs reviewed.  Constitutional:      General: She is not in acute distress.    Appearance: She is well-developed. She is not diaphoretic.  HENT:     Head: Normocephalic and atraumatic.     Nose: Nose normal.     Mouth/Throat:     Dentition: Abnormal dentition.  Eyes:     General: No scleral icterus.       Right eye: No discharge.        Left eye: No discharge.     Conjunctiva/sclera: Conjunctivae normal.     Pupils: Pupils are equal, round, and reactive to light.  Neck:     Musculoskeletal: Normal range of motion and neck supple.  Cardiovascular:     Rate and Rhythm: Normal rate and regular rhythm.     Heart sounds: No murmur. No friction rub. No gallop.   Pulmonary:     Effort: Tachypnea and accessory muscle usage present. No respiratory distress.     Breath sounds: No stridor. Wheezing (diffuse insp and exp wheezing) present. No rales.  Abdominal:     General: There is no distension.     Palpations: Abdomen is soft.     Tenderness: There is no abdominal tenderness.  Musculoskeletal:         General: No tenderness.  Skin:  General: Skin is warm and dry.     Findings: No erythema or rash.  Neurological:     Mental Status: She is alert and oriented to person, place, and time.     ED Results and Treatments Labs (all labs ordered are listed, but only abnormal results are displayed) Labs Reviewed  COMPREHENSIVE METABOLIC PANEL - Abnormal; Notable for the following components:      Result Value   Sodium 128 (*)    Potassium 3.3 (*)    Chloride 88 (*)    Glucose, Bld 107 (*)    Calcium 8.4 (*)    Albumin 3.2 (*)    All other components within normal limits  CBC - Abnormal; Notable for the following components:   RBC 3.29 (*)    Hemoglobin 7.2 (*)    HCT 24.3 (*)    MCV 73.9 (*)    MCH 21.9 (*)    MCHC 29.6 (*)    RDW 17.2 (*)    All other components within normal limits  TYPE AND SCREEN  ABO/RH  PREPARE RBC (CROSSMATCH)                                                                                                                         EKG  EKG Interpretation  Date/Time:  Wednesday January 25 2018 21:04:13 EST Ventricular Rate:  114 PR Interval:  156 QRS Duration: 70 QT Interval:  322 QTC Calculation: 443 R Axis:   85 Text Interpretation:  Sinus tachycardia with Premature supraventricular complexes Low voltage QRS Septal infarct , age undetermined Abnormal ECG No significant change since last tracing Confirmed by Addison Lank [phone]) on 01/26/2018 12:11:39 AM      Radiology No results found. Pertinent labs & imaging results that were available during my care of the patient were reviewed by me and considered in my medical decision making (see chart for details).  Medications Ordered in ED Medications  ipratropium-albuterol (DUONEB) 0.5-2.5 (3) MG/3ML nebulizer solution 3 mL (has no administration in time range)  0.9 %  sodium chloride infusion (Manually program via Guardrails IV Fluids) (has no administration in time range)                                                                                                                                     Procedures Procedures CRITICAL CARE Performed by: Grayce Sessions Riggs Dineen Total critical care  time: 55 minutes Critical care time was exclusive of separately billable procedures and treating other patients. Critical care was necessary to treat or prevent imminent or life-threatening deterioration. Critical care was time spent personally by me on the following activities: development of treatment plan with patient and/or surrogate as well as nursing, discussions with consultants, evaluation of patient's response to treatment, examination of patient, obtaining history from patient or surrogate, ordering and performing treatments and interventions, ordering and review of laboratory studies, ordering and review of radiographic studies, pulse oximetry and re-evaluation of patient's condition.   (including critical care time)  Medical Decision Making / ED Course I have reviewed the nursing notes for this encounter and the patient's prior records (if available in EHR or on provided paperwork).      On review of records, patient noted to have baseline hemoglobin between 7 and 8.  Patient denied any prior GI bleeds.  No anticoagulation.  No prior need for transfusion.  Blood transfusion initiated in the emergency department. Hemoccult negative. Discussed the case with cardiology who requested medicine admission for anemia work-up.   Patient appears to be having increased work of breathing without increase need for oxygen.  This may be related to COPD exacerbation.  Will give patient a breathing treatment.    Final Clinical Impression(s) / ED Diagnoses Final diagnoses:  SOB (shortness of breath)  Anemia, unspecified type  COPD exacerbation ([place])      This chart was dictated using voice recognition software.  Despite best efforts to proofread,  errors can occur which can change the  documentation meaning.   Fatima Blank, MD 01/26/18 [phone]

## 2018-01-26 NOTE — Progress Notes (Signed)
Initial Nutrition Assessment  DOCUMENTATION CODES:   Non-severe (moderate) malnutrition in context of chronic illness  INTERVENTION:   - Glucerna Shake po BID, each supplement provides 220 kcal and 10 grams of protein (chocolate flavor)  - Pro-stat 30 ml BID, each supplement provides 100 kcal and 15 grams of protein  - MVI with minerals daily  NUTRITION DIAGNOSIS:   Moderate Malnutrition related to chronic illness (COPD) as evidenced by mild muscle depletion, moderate muscle depletion, percent weight loss (12.1% weight loss in 5 months).  GOAL:   Patient will meet greater than or equal to 90% of their needs  MONITOR:   PO intake, Supplement acceptance, Weight trends, Skin, Labs  REASON FOR ASSESSMENT:   Consult COPD Protocol  ASSESSMENT:   73 year old female who presented to the ED on 1/22 with low hemoglobin. PMH significant for COPD, T2DM, HLD, HTN, tobacco use, severe PVD, and recent ischemic ulcer on foot.  Spoke with pt at bedside. Noted pt with difficulty breathing at time of visit but able to answer all questions appropriately.  Pt reports that she has a good appetite and has not noticed any changes in her appetite or in her eating habits. Pt shares that she eats "two-and-a-half" meals daily.  Breakfast: cereal with coffee or toast with coffee Lunch: 1/2 sandwich Dinner: meat, vegetable, and potato or bread  Pt endorses weight loss but states she does not know why she is losing weight. Pt reports her UBW as 247 lbs and that she last weighed this 6-12 months ago. RD obtained bed weight and recorded in chart: 161.2 lbs.  Per weight history in chart, pt with 9.9 kg weight loss since 08/26/17. This is a 12.1% weight loss in 5 months which is significant for timeframe.  Pt is amenable to an oral nutrition supplement in chocolate flavor. RD will also order Pro-stat and MVI to aid in wound healing.  Medications reviewed and include: SSI, Protonix  Labs reviewed:  sodium 128 (L), potassium 3.3 (L), hemoglobin 7.2 (L) CBG: 152  NUTRITION - FOCUSED PHYSICAL EXAM:    Most Recent Value  Orbital Region  No depletion  Upper Arm Region  Mild depletion  Thoracic and Lumbar Region  No depletion  Buccal Region  No depletion  Temple Region  Mild depletion  Clavicle Bone Region  Mild depletion  Clavicle and Acromion Bone Region  Moderate depletion  Scapular Bone Region  Moderate depletion  Dorsal Hand  Mild depletion  Patellar Region  Moderate depletion  Anterior Thigh Region  Moderate depletion  Posterior Calf Region  Moderate depletion  Edema (RD Assessment)  Mild [BLE]  Hair  Reviewed  Eyes  Reviewed  Mouth  Reviewed  Skin  Reviewed  Nails  Reviewed       Diet Order:   Diet Order            Diet Carb Modified Fluid consistency: Thin; Room service appropriate? Yes  Diet effective now              EDUCATION NEEDS:   No education needs have been identified at this time  Skin:  Skin Assessment: Skin Integrity Issues: Diabetic Ulcer: L toe  Last BM:  1/23  Height:   Ht Readings from Last 1 Encounters:  01/24/18 5\' 8"  (1.727 m)    Weight:   Wt Readings from Last 1 Encounters:  01/26/18 73.1 kg    Ideal Body Weight:  63.6 kg  BMI:  24.17 kg/m^2  Estimated Nutritional Needs:  Kcal:  1700-1900  Protein:  85-95 grams  Fluid:  1.7-1.9 L    Gaynell Face, MS, RD, LDN Inpatient Clinical Dietitian Pager: [phone] Weekend/After Hours: [phone]

## 2018-01-26 NOTE — H&P (Signed)
History and Physical    [name] TGG:[id number] DOB: [birth year] DOA: 01/25/2018  PCP: Lujean Amel, MD  Patient coming from: Home.  I have personally briefly reviewed patient's old medical records in [place] and Care everywhere.   Chief Complaint: Called in by cardiology office with hemoglobin of 6.8.  Patient is short of breath for about 3 weeks now.  HPI: [name] is a 73 y.o. female with medical history significant of COPD with chronic hypoxic failure on 4 L oxygen at home, smoker, severe peripheral vascular disease, hypertension, diabetes on oral hypoglycemics and recently developed ischemic ulcer on her foot who presents to the emergency room after having a routine evaluation at cardiology office and found to have hemoglobin of 6.8.  Patient is an overall poor health.  She has multiple issues happening.  According to the patient, she had been suffering from this poor circulation on her legs for some time, she had a wound on her foot that was cleaned and was treated with antibiotics.  Cardiologist were planning to do angiogram and possible stenting so she went to visit them.  More than her legs, she is having trouble with her breathing.  According to the patient.  She always have some trouble breathing however since last 3 weeks she has worsening shortness of breath, wheezing, dry cough with no sputum.  She gets short of breath on minimal ambulation.  She feels fatigued and weak. Denies any fever or chills.  Extreme shortness of breath at occasions with wheezing and tightness of chest.  No chest pain.  Denies any nausea vomiting.  No abdominal pain.  Denies any hematemesis, hemoptysis, blood in stool or melena. Her hemoglobin has been around 8 since last 1 year, she has severe iron deficiency on earlier serological test.  Patient stated that she had a colonoscopy about 7 years ago and was normal.  ED Course: Patient in obvious respiratory distress with wheezing and tachypnea  in the ER.  She was treated with IV steroids and multiple rounds of bronchodilator therapy.  Her hemoglobin recheck was 7.2 in the ER.  A chest x-ray shows hyperinflated lungs but no acute findings.  Patient was started on 1 unit of PRBC after consent in the ER.  Cardiology also notified about patient's hospitalization.  Review of Systems: As per HPI otherwise 10 point review of systems negative.    Past Medical History:  Diagnosis Date  . COPD (chronic obstructive pulmonary disease) ([place])   . Diabetes mellitus   . Diastolic dysfunction    history  . Hyperlipidemia   . Hypertension     Past Surgical History:  Procedure Laterality Date  . TUBAL LIGATION       reports that she has been smoking. She has a 52.00 pack-year smoking history. She has never used smokeless tobacco. No history on file for alcohol and drug.  No Known Allergies  Family History  Problem Relation Age of Onset  . Heart disease Father   . Lung cancer Father      Prior to Admission medications   Medication Sig Start Date End Date Taking? Authorizing Provider  albuterol (PROVENTIL) (2.5 MG/3ML) 0.083% nebulizer solution Take 3 mLs (2.5 mg total) by nebulization every 2 (two) hours as needed for wheezing or shortness of breath. 04/23/16   Blayklee Mable, Henreitta Leber, MD  amLODipine-valsartan (EXFORGE) 5-160 MG tablet Take 1 tablet by mouth daily.    [provider]  aspirin EC 81 MG tablet Take 81 mg by mouth  daily.    [provider]  atorvastatin (LIPITOR) 20 MG tablet Take 20 mg by mouth every evening.    [provider]  BYETTA 10 MCG PEN 10 MCG/0.04ML SOPN injection Inject 10 mcg into the skin 2 (two) times daily. 07/06/17   [provider]  ciprofloxacin (CIPRO) 500 MG tablet Take 1 tablet (500 mg total) by mouth 2 (two) times daily. 01/16/18   Trula Slade, DPM  clindamycin (CLEOCIN) 300 MG capsule Take 1 capsule (300 mg total) by mouth 3 (three) times daily. 01/16/18   Trula Slade, DPM  diazepam (VALIUM) 5 MG tablet TAKE 1 TABLET BY MOUTH 1 TO 2 TIMES DAILY AS NEEDED 09/13/17   [provider]  ipratropium (ATROVENT HFA) 17 MCG/ACT inhaler Inhale 2 puffs into the lungs every 6 (six) hours as needed for wheezing.    [provider]  methocarbamol (ROBAXIN) 500 MG tablet Take 1 tablet (500 mg total) by mouth every 8 (eight) hours as needed for muscle spasms. 08/27/17   Purohit, Konrad Dolores, MD  omeprazole (PRILOSEC) 20 MG capsule Take 1 tablet by mouth daily. 11/18/10   [provider]  PARoxetine (PAXIL) 30 MG tablet Take 30 mg by mouth daily.     [provider]  silver sulfADIAZINE (SILVADENE) 1 % cream Apply 1 application topically daily. 01/16/18   Trula Slade, DPM  sitaGLIPtan-metformin (JANUMET) 50-1000 MG per tablet Take 1 tablet by mouth 2 (two) times daily with a meal.      [provider]  SYMBICORT 160-4.5 MCG/ACT inhaler Inhale 2 puffs into the lungs 2 (two) times daily. 08/09/17   [provider]    Physical Exam: Vitals:   01/26/18 0545 01/26/18 0630 01/26/18 0715 01/26/18 0730  BP: (!) 158/84  (!) 174/82 (!) 159/69  Pulse: (!) 111 (!) 104 100 (!) 108  Resp: (!) 27 (!) 28 (!) 32 (!) 22  Temp:      TempSrc:      SpO2: 98% 97% 98% 95%    Constitutional: NAD, calm, comfortable Vitals:   01/26/18 0545 01/26/18 0630 01/26/18 0715 01/26/18 0730  BP: (!) 158/84  (!) 174/82 (!) 159/69  Pulse: (!) 111 (!) 104 100 (!) 108  Resp: (!) 27 (!) 28 (!) 32 (!) 22  Temp:      TempSrc:      SpO2: 98% 97% 98% 95%   Eyes: PERRL, lids and conjunctivae normal ENMT: Mucous membranes are moist. Posterior pharynx clear of any exudate or lesions.Normal dentition.  Neck: normal, supple, no masses, no thyromegaly Respiratory: Bilateral poor air entry, extensive inspiratory and expiratory wheezes, expiratory crackles on posterior lung bases.  Moderate respiratory distress.    Cardiovascular: Regular rate and  rhythm, tachycardic, no murmurs / rubs / gallops. No extremity edema. No carotid bruits.  Abdomen: no tenderness, no masses palpated. No hepatosplenomegaly. Bowel sounds positive.  Musculoskeletal: no clubbing / cyanosis. No joint deformity upper and lower extremities. Good ROM, no contractures. Normal muscle tone.  Skin: no rashes, lesions, ulcers. No induration Neurologic: CN 2-12 grossly intact. Sensation intact, DTR normal. Strength 5/5 in all 4.  Psychiatric: Normal judgment and insight. Alert and oriented x 3. Normal mood.  Right great toe medial aspect ischemic ulcer with no active drainage present. Pedal pulses not palpable.  Faint dusky discoloration of the left toe tips.   Labs on Admission: I have personally reviewed following labs and imaging studies  CBC: Recent Labs  Lab 01/19/18 1302  01/24/18 1526 01/25/18 2127  WBC 7.4 3.7 5.4  NEUTROABS 5,535  --   --   HGB 7.7* 6.8* 7.2*  HCT 25.4* 21.9* 24.3*  MCV 73.4* 69* 73.9*  PLT 286 184 709   Basic Metabolic Panel: Recent Labs  Lab 01/19/18 1302 01/24/18 1526 01/25/18 2127  NA 134* 130* 128*  K 4.0 3.4* 3.3*  CL 93* 87* 88*  CO2 30 25 29   GLUCOSE 77 80 107*  BUN 12 10 14   CREATININE 0.75 0.75 0.77  CALCIUM 9.1 8.3* 8.4*   GFR: Estimated Creatinine Clearance: 65.1 mL/min (by C-G formula based on SCr of 0.77 mg/dL). Liver Function Tests: Recent Labs  Lab 01/25/18 2127  AST 26  ALT 17  ALKPHOS 51  BILITOT 0.4  PROT 6.8  ALBUMIN 3.2*   No results for input(s): LIPASE, AMYLASE in the last 168 hours. No results for input(s): AMMONIA in the last 168 hours. Coagulation Profile: No results for input(s): INR, PROTIME in the last 168 hours. Cardiac Enzymes: No results for input(s): CKTOTAL, CKMB, CKMBINDEX, TROPONINI in the last 168 hours. BNP (last 3 results) No results for input(s): PROBNP in the last 8760 hours. HbA1C: No results for input(s): HGBA1C in the last 72 hours. CBG: No results for input(s):  GLUCAP in the last 168 hours. Lipid Profile: No results for input(s): CHOL, HDL, LDLCALC, TRIG, CHOLHDL, LDLDIRECT in the last 72 hours. Thyroid Function Tests: Recent Labs    01/24/18 1526  TSH 1.140   Anemia Panel: No results for input(s): VITAMINB12, FOLATE, FERRITIN, TIBC, IRON, RETICCTPCT in the last 72 hours. Urine analysis:    Component Value Date/Time   COLORURINE YELLOW 08/24/2017 2000   APPEARANCEUR CLEAR 08/24/2017 2000   LABSPEC 1.018 08/24/2017 2000   PHURINE 5.0 08/24/2017 2000   GLUCOSEU NEGATIVE 08/24/2017 2000   HGBUR NEGATIVE 08/24/2017 2000   [place] NEGATIVE 08/24/2017 2000   KETONESUR 5 (A) 08/24/2017 2000   PROTEINUR NEGATIVE 08/24/2017 2000   NITRITE NEGATIVE 08/24/2017 2000   LEUKOCYTESUR NEGATIVE 08/24/2017 2000    Radiological Exams on Admission: Dg Chest 2 View  Result Date: 01/26/2018 CLINICAL DATA:  Shortness of breath.  History of COPD and pneumonia. EXAM: CHEST - 2 VIEW COMPARISON:  Chest radiograph April 23, 2016 and CT chest October 08, 2016 FINDINGS: Cardiac silhouette is upper limits of normal size. Calcified aortic arch. Chronic interstitial changes increased lung volumes without pleural effusion or focal consolidation. No pneumothorax. Osteopenia. New age indeterminate T12 severe compression fracture. IMPRESSION: 1. Borderline cardiomegaly and COPD. 2. Osteopenia and age indeterminate severe T12 compression fracture. 3.  Aortic Atherosclerosis (ICD10-I70.0). Electronically Signed   By: Elon Alas M.D.   On: 01/26/2018 01:11   Dg Chest Port 1 View  Result Date: 01/26/2018 CLINICAL DATA:  73 year old female with shortness of breath. EXAM: PORTABLE CHEST 1 VIEW COMPARISON:  01/26/2018 radiographs, chest CT 10/08/2016, and earlier. FINDINGS: Portable AP semi upright view at 0609 hours. Mildly tortuous thoracic aorta with calcified atherosclerosis. Other mediastinal contours are within normal limits. Large lung volumes. Coarse bilateral  pulmonary interstitial opacity appears stable since 2018. No pneumothorax, pulmonary edema, or acute pulmonary opacity. Visualized tracheal air column is within normal limits. Paucity of bowel gas in the upper abdomen. Stable visualized osseous structures. IMPRESSION: Stable chronic lung disease. No acute cardiopulmonary abnormality identified. Electronically Signed   By: Genevie Ann M.D.   On: 01/26/2018 06:38    EKG: Independently reviewed. Patient has been sinus rhythm, low voltage EKG that is  comparable to previous EKG.  Assessment/Plan Principal Problem:   Anemia Active Problems:   Essential hypertension   Chronic respiratory failure (HCC)   COPD exacerbation (HCC)   Smoker   Dyslipidemia associated with type 2 diabetes mellitus (HCC)   Diabetes mellitus, type II, insulin dependent ([place])   GERD without esophagitis   Hyponatremia   Hypokalemia   PVD (peripheral vascular disease) (HCC)   Ischemic ulcer diabetic foot (HCC)   Acute on chronic iron deficiency anemia: Recheck hemoglobin was 7.2.  Symptomatic anemia.  1 unit PRBC transfused in ER.  Will recheck hemoglobin before transfusing another unit.  She has iron deficiency anemia, she will benefit with iron transfusion.  We will transfuse IV iron after test dose.  We may load her with IV iron while she is in the hospital. Patient is chronically sick with multiple medical problems, she may have chronic GI bleeding and other pathology.  She will need endoscopy evaluation, however she is with COPD exacerbation and unlikely tolerate any sedation today. Once her respiratory issues stabilize, need consultation with GI for endoluminal evaluation.  COPD with acute exacerbation:  With chronic hypoxic respiratory failure on 4 L oxygen. Start patient on IV and oral steroids, and recently steroids and aggressive bronchodilator therapy.  Will treat with short course of antibiotics for COPD exacerbation.  Oxygen therapy, incentive spirometry deep  breathing exercises.  Severe peripheral vascular disease with ischemic ulcer: Ongoing smoker.  Followed by cardiology.  Ulcer is without evidence of infection.  Revascularization planned.  Will follow recommendations.  Hypertension: Elevated with acute respiratory distress.  Resume home medications.  Hypokalemia: Will replace and monitor levels.  Type 2 diabetes: Patient uses [place] 10 mcg twice a day at home.  She will continue this.  Holding metformin in case patient needs any contrasted studies.  Expect blood sugars to deteriorate with use of steroids, will cover with sliding scale insulin.  Smoker: Counseled to quit.  Provide nicotine patch.  DVT prophylaxis: Heparin subcu. Code Status: Full code. Family Communication: No family at bedside. Disposition Plan: Home with home health care. Consults called: Cardiology called by ER. Admission status: Inpatient.   Barb Merino MD [facility] Pager [phone]  If 7PM-7AM, please contact night-coverage [email or web address] Password [email or web address]  01/26/2018, 8:12 AM

## 2018-01-26 NOTE — Telephone Encounter (Signed)
Error

## 2018-01-26 NOTE — Telephone Encounter (Signed)
Left message to call back  

## 2018-01-26 NOTE — ED Notes (Signed)
ED TO INPATIENT HANDOFF REPORT  ED Nurse Name and Phone #: Demetrius Revel [phone]  Name/Age/Gender [name] 73 y.o. female Room/Bed: OTFC/OTF  Code Status   Code Status: Prior  Home/SNF/Other Home Patient oriented to: self, place, time and situation Is this baseline? Yes   Triage Complete: Triage complete   Chief Complaint Low Blood Count    Triage Note Pt went to see Dr. Gwenlyn Found yesterday for pre op visit for angiography next week. He called today and reported to her her HGB level was low and go to the ED. She denies pain, no blood in stool, no change in her SOB.    Allergies No Known Allergies  Level of Care/Admitting Diagnosis ED Disposition    ED Disposition Condition [facility] Area: [place] [[id number]]  Level of Care: Medical Telemetry [104]  Diagnosis: COPD exacerbation [facility]) [[id number]]  Admitting Physician: Barb Merino [[id number]]  Attending Physician: Barb Merino [[id number]]  Estimated length of stay: past midnight tomorrow  Certification:: I certify this patient will need inpatient services for at least 2 midnights  PT Class (Do Not Modify): Inpatient [101]  PT Acc Code (Do Not Modify): Private [1]       Medical/Surgery History Past Medical History:  Diagnosis Date  . COPD (chronic obstructive pulmonary disease) ([place])   . Diabetes mellitus   . Diastolic dysfunction    history  . Hyperlipidemia   . Hypertension    Past Surgical History:  Procedure Laterality Date  . TUBAL LIGATION       IV Location/Drains/Wounds Patient Lines/Drains/Airways Status   Active Line/Drains/Airways    Name:   Placement date:   Placement time:   Site:   Days:   Peripheral IV 01/25/18 Left Forearm   01/25/18    2355    Forearm   1   Peripheral IV 01/25/18 Left Forearm   01/25/18    2355    Forearm   1          Intake/Output Last 24 hours  Intake/Output Summary (Last 24 hours) at 01/26/2018 1157 Last data filed at  01/26/2018 0845 Gross per 24 hour  Intake 832.42 ml  Output -  Net 832.42 ml    Labs/Imaging Results for orders placed or performed during the hospital encounter of 01/25/18 (from the past 48 hour(s))  Type and screen [place]     Status: None (Preliminary result)   Collection Time: 01/25/18  9:25 PM  Result Value Ref Range   ABO/RH(D) O POS    Antibody Screen NEG    Sample Expiration 01/28/2018    Unit Number [id number]    Blood Component Type RED CELLS,LR    Unit division 00    Status of Unit ISSUED    Transfusion Status OK TO TRANSFUSE    Crossmatch Result Compatible    Unit Number [id number]    Blood Component Type RBC LR PHER2    Unit division 00    Status of Unit ISSUED    Transfusion Status OK TO TRANSFUSE    Crossmatch Result      Compatible Performed at [facility], [address] [place], [place] [address]   ABO/Rh     Status: None   Collection Time: 01/25/18  9:25 PM  Result Value Ref Range   ABO/RH(D)      O POS Performed at [facility] [address] [place], [place] [address]   Comprehensive metabolic  panel     Status: Abnormal   Collection Time: 01/25/18  9:27 PM  Result Value Ref Range   Sodium 128 (L) 135 - 145 mmol/L   Potassium 3.3 (L) 3.5 - 5.1 mmol/L   Chloride 88 (L) 98 - 111 mmol/L   CO2 29 22 - 32 mmol/L   Glucose, Bld 107 (H) 70 - 99 mg/dL   BUN 14 8 - 23 mg/dL   Creatinine, Ser 0.77 0.44 - 1.00 mg/dL   Calcium 8.4 (L) 8.9 - 10.3 mg/dL   Total Protein 6.8 6.5 - 8.1 g/dL   Albumin 3.2 (L) 3.5 - 5.0 g/dL   AST 26 15 - 41 U/L   ALT 17 0 - 44 U/L   Alkaline Phosphatase 51 38 - 126 U/L   Total Bilirubin 0.4 0.3 - 1.2 mg/dL   GFR calc non Af Amer >60 >60 mL/min   GFR calc Af Amer >60 >60 mL/min   Anion gap 11 5 - 15    Comment: Performed at [facility], [place] [address] [place], [place] [address]  CBC     Status: Abnormal   Collection Time: 01/25/18  9:27 PM  Result Value Ref Range   WBC  5.4 4.0 - 10.5 K/uL   RBC 3.29 (L) 3.87 - 5.11 MIL/uL   Hemoglobin 7.2 (L) 12.0 - 15.0 g/dL    Comment: Reticulocyte Hemoglobin testing may be clinically indicated, consider ordering this additional test [id number]    HCT 24.3 (L) 36.0 - 46.0 %   MCV 73.9 (L) 80.0 - 100.0 fL   MCH 21.9 (L) 26.0 - 34.0 pg   MCHC 29.6 (L) 30.0 - 36.0 g/dL   RDW 17.2 (H) 11.5 - 15.5 %   Platelets 178 150 - 400 K/uL   nRBC 0.0 0.0 - 0.2 %    Comment: Performed at [facility], [place] [address], [place], [place] [address]  Prepare RBC     Status: None   Collection Time: 01/26/18 12:20 AM  Result Value Ref Range   Order Confirmation      ORDER PROCESSED BY BLOOD BANK Performed at [facility], [place] [address], [place], [place] [address]   POC occult blood, ED Provider will collect     Status: None   Collection Time: 01/26/18  5:47 AM  Result Value Ref Range   Fecal Occult Bld NEGATIVE NEGATIVE   Dg Chest 2 View  Result Date: 01/26/2018 CLINICAL DATA:  Shortness of breath.  History of COPD and pneumonia. EXAM: CHEST - 2 VIEW COMPARISON:  Chest radiograph April 23, 2016 and CT chest October 08, 2016 FINDINGS: Cardiac silhouette is upper limits of normal size. Calcified aortic arch. Chronic interstitial changes increased lung volumes without pleural effusion or focal consolidation. No pneumothorax. Osteopenia. New age indeterminate T12 severe compression fracture. IMPRESSION: 1. Borderline cardiomegaly and COPD. 2. Osteopenia and age indeterminate severe T12 compression fracture. 3.  Aortic Atherosclerosis (ICD10-I70.0). Electronically Signed   By: Elon Alas M.D.   On: 01/26/2018 01:11   Dg Chest Port 1 View  Result Date: 01/26/2018 CLINICAL DATA:  73 year old female with shortness of breath. EXAM: PORTABLE CHEST 1 VIEW COMPARISON:  01/26/2018 radiographs, chest CT 10/08/2016, and earlier. FINDINGS: Portable AP semi upright view at 0609 hours. Mildly tortuous thoracic aorta with calcified  atherosclerosis. Other mediastinal contours are within normal limits. Large lung volumes. Coarse bilateral pulmonary interstitial opacity appears stable since 2018. No pneumothorax, pulmonary edema, or acute pulmonary opacity.  Visualized tracheal air column is within normal limits. Paucity of bowel gas in the upper abdomen. Stable visualized osseous structures. IMPRESSION: Stable chronic lung disease. No acute cardiopulmonary abnormality identified. Electronically Signed   By: Genevie Ann M.D.   On: 01/26/2018 06:38    Pending Labs [employer] (From admission, onward)    Start     Ordered   Signed and Occupational hygienist morning,   R     Signed and Held   Signed and Held  CBC  Tomorrow morning,   R     Signed and Held   Signed and Held  Occult blood card to lab, stool  Once,   R     Signed and Held          Vitals/Pain Today's Vitals   01/26/18 0815 01/26/18 0845 01/26/18 1000 01/26/18 1140  BP: (!) 150/70 (!) 147/73 (!) 154/77   Pulse: (!) 103 (!) 27 (!) 107   Resp: (!) 37 (!) 22 (!) 28   Temp:      TempSrc:      SpO2: 97% 95% 97%   PainSc:    0-No pain    Isolation Precautions No active isolations  Medications Medications  ipratropium-albuterol (DUONEB) 0.5-2.5 (3) MG/3ML nebulizer solution 3 mL (3 mLs Nebulization Given 01/26/18 0103)  0.9 %  sodium chloride infusion (Manually program via Guardrails IV Fluids) ( Intravenous Stopped 01/26/18 0845)  ipratropium-albuterol (DUONEB) 0.5-2.5 (3) MG/3ML nebulizer solution 3 mL (3 mLs Nebulization Given 01/26/18 0459)  ipratropium-albuterol (DUONEB) 0.5-2.5 (3) MG/3ML nebulizer solution 3 mL (3 mLs Nebulization Given 01/26/18 0553)  methylPREDNISolone sodium succinate (SOLU-MEDROL) 125 mg/2 mL injection 125 mg (125 mg Intravenous Given 01/26/18 0553)  potassium chloride SA (K-DUR,KLOR-CON) CR tablet 40 mEq (40 mEq Oral Given 01/26/18 1016)    Mobility non-ambulatory High fall risk   Focused Assessments Pulmonary  Assessment Handoff:  Lung sounds: Bilateral Breath Sounds: Expiratory wheezes O2 Device: Nasal Cannula O2 Flow Rate (L/min): 4 L/min      Recommendations: See Admitting Provider Note  Report given to:   Additional Notes:

## 2018-01-26 NOTE — ED Notes (Signed)
Attempted calling report to 5W x 1

## 2018-01-27 ENCOUNTER — Ambulatory Visit: Payer: Medicare HMO | Admitting: Podiatry

## 2018-01-27 DIAGNOSIS — E785 Hyperlipidemia, unspecified: Secondary | ICD-10-CM

## 2018-01-27 DIAGNOSIS — J9610 Chronic respiratory failure, unspecified whether with hypoxia or hypercapnia: Secondary | ICD-10-CM

## 2018-01-27 DIAGNOSIS — E1169 Type 2 diabetes mellitus with other specified complication: Secondary | ICD-10-CM

## 2018-01-27 LAB — BPAM RBC
Blood Product Expiration Date: 202002182359
Blood Product Expiration Date: 202002202359
ISSUE DATE / TIME: 202001230450
ISSUE DATE / TIME: 202001230109
UNIT TYPE AND RH: 5100
Unit Type and Rh: 5100

## 2018-01-27 LAB — CBC
HCT: 31.4 % — ABNORMAL LOW (ref 36.0–46.0)
Hemoglobin: 9.8 g/dL — ABNORMAL LOW (ref 12.0–15.0)
MCH: 23.8 pg — ABNORMAL LOW (ref 26.0–34.0)
MCHC: 31.2 g/dL (ref 30.0–36.0)
MCV: 76.4 fL — ABNORMAL LOW (ref 80.0–100.0)
Platelets: 166 10*3/uL (ref 150–400)
RBC: 4.11 MIL/uL (ref 3.87–5.11)
RDW: 17.5 % — ABNORMAL HIGH (ref 11.5–15.5)
WBC: 3 10*3/uL — ABNORMAL LOW (ref 4.0–10.5)
nRBC: 0 % (ref 0.0–0.2)

## 2018-01-27 LAB — GLUCOSE, CAPILLARY
Glucose-Capillary: 140 mg/dL — ABNORMAL HIGH (ref 70–99)
Glucose-Capillary: 133 mg/dL — ABNORMAL HIGH (ref 70–99)
Glucose-Capillary: 125 mg/dL — ABNORMAL HIGH (ref 70–99)
Glucose-Capillary: 154 mg/dL — ABNORMAL HIGH (ref 70–99)
Glucose-Capillary: 155 mg/dL — ABNORMAL HIGH (ref 70–99)

## 2018-01-27 LAB — BASIC METABOLIC PANEL WITH GFR
Anion gap: 9 (ref 5–15)
BUN: 13 mg/dL (ref 8–23)
CO2: 31 mmol/L (ref 22–32)
Calcium: 8.9 mg/dL (ref 8.9–10.3)
Chloride: 93 mmol/L — ABNORMAL LOW (ref 98–111)
Creatinine, Ser: 0.69 mg/dL (ref 0.44–1.00)
GFR calc Af Amer: >60 mL/min (ref >60)
GFR calc non Af Amer: >60 mL/min (ref >60)
Glucose, Bld: 157 mg/dL — ABNORMAL HIGH (ref 70–99)
Potassium: 4 mmol/L (ref 3.5–5.1)
Sodium: 133 mmol/L — ABNORMAL LOW (ref 135–145)

## 2018-01-27 LAB — TYPE AND SCREEN
ABO/RH(D): O POS
Antibody Screen: NEGATIVE
Unit division: 0
Unit division: 0

## 2018-01-27 MED ORDER — IPRATROPIUM-ALBUTEROL 0.5-2.5 (3) MG/3ML IN SOLN: 3.0000 mL | RESPIRATORY_TRACT | Status: DC | PRN

## 2018-01-27 MED ORDER — BUDESONIDE 0.5 MG/2ML IN SUSP
0.5000 mg | Freq: Two times a day (BID) | RESPIRATORY_TRACT | Status: DC
  Administered 2018-01-27 – 2018-02-03 (×14): 0.5 mg via RESPIRATORY_TRACT
  Filled 2018-01-27 (×14): qty 2

## 2018-01-27 MED ORDER — SENNOSIDES-DOCUSATE SODIUM 8.6-50 MG PO TABS: 2.0000 | ORAL_TABLET | Freq: Every evening | ORAL | Status: DC | PRN

## 2018-01-27 MED ORDER — IPRATROPIUM-ALBUTEROL 0.5-2.5 (3) MG/3ML IN SOLN
3.0000 mL | Freq: Four times a day (QID) | RESPIRATORY_TRACT | Status: DC
  Administered 2018-01-27 – 2018-01-29 (×9): 3 mL via RESPIRATORY_TRACT
  Filled 2018-01-27 (×10): qty 3

## 2018-01-27 MED ORDER — POLYETHYLENE GLYCOL 3350 17 G PO PACK: 17.0000 g | PACK | Freq: Every day | ORAL | Status: DC | PRN

## 2018-01-27 MED ORDER — SILVER SULFADIAZINE 1 % EX CREA
TOPICAL_CREAM | Freq: Two times a day (BID) | CUTANEOUS | Status: DC
  Administered 2018-01-27: 13:00:00 via TOPICAL
  Administered 2018-01-27: 1 via TOPICAL
  Administered 2018-01-28 – 2018-01-31 (×7): via TOPICAL
  Administered 2018-02-01: 1 via TOPICAL
  Administered 2018-02-01 – 2018-02-03 (×4): via TOPICAL
  Filled 2018-01-27: qty 85

## 2018-01-27 MED ORDER — FERROUS SULFATE 325 (65 FE) MG PO TABS
325.0000 mg | ORAL_TABLET | Freq: Three times a day (TID) | ORAL | Status: DC
  Administered 2018-01-27 – 2018-02-03 (×22): 325 mg via ORAL
  Filled 2018-01-27 (×22): qty 1

## 2018-01-27 MED ORDER — GUAIFENESIN-DM 100-10 MG/5ML PO SYRP
5.0000 mL | ORAL_SOLUTION | ORAL | Status: DC | PRN
  Administered 2018-01-27: 5 mL via ORAL
  Filled 2018-01-27: qty 5

## 2018-01-27 MED ORDER — ORAL CARE MOUTH RINSE
15.0000 mL | Freq: Two times a day (BID) | OROMUCOSAL | Status: DC
  Administered 2018-01-28 – 2018-02-03 (×7): 15 mL via OROMUCOSAL

## 2018-01-27 NOTE — Consult Note (Signed)
[place] Nurse wound consult note Reason for Consult: Full thickness wound at plantar aspect of RGT Wound type: Neuropathic Pressure Injury POA: NA Measurement:3.5cm x 1cm x 0.2cm Wound bed: pink, dry, free of necrotic tissue Drainage (amount, consistency, odor) none Periwound:intact Dressing procedure/placement/frequency: Patient had been using silver sulfadiazine (silvadene) cream daily; provider overseeing her care for wound is not known. I will continue that care but will increase to twice daily while in house. Patient to return to the community provider for ongoing assessments and oversight, including off-loading protective shoe wear. Patient is instructed to wear protective shoe wear at all times when OOB to reduce the incidence of injury to feet and she indicates understanding.  [place] nursing team will not follow, but will remain available to this patient, the nursing and medical teams.  Please re-consult if needed. Thanks, Maudie Flakes, MSN, RN, [place], Arther Abbott  Pager# [phone]

## 2018-01-27 NOTE — Progress Notes (Signed)
Patient had increased work of breathing and audible expiratory wheezing. Patient received albuterol treatment. Work of breathing decreased and wheezing was lessened but still auscultated. Will continue to monitor.

## 2018-01-27 NOTE — Progress Notes (Signed)
PROGRESS NOTE    [name]  SEG:[id number] DOB: 1945-01-06 DOA: 01/25/2018 PCP: Lujean Amel, MD   Brief Narrative:  73 year old with past medical history of COPD on 4 L nasal cannula at home, severe peripheral vascular disease, active tobacco use, diabetes mellitus type 2, essential hypertension went to cardiology office for routine appointment was found to have hemoglobin of 6.8.  There is been outpatient discussion about possible stent placement in her lower extremity to help her circulation.  Due to shortness of breath and symptomatic anemia she was sent to the hospital for further care.  In the ER she was noted to be wheezing and slightly tachypneic.  She was started on IV steroids and given bronchodilator treatment.  1 unit of PRBC was transfused.   Assessment & Plan:   Principal Problem:   Anemia Active Problems:   Essential hypertension   Chronic respiratory failure (HCC)   COPD exacerbation (HCC)   Smoker   Dyslipidemia associated with type 2 diabetes mellitus (HCC)   Diabetes mellitus, type II, insulin dependent ([place])   GERD without esophagitis   Hyponatremia   Hypokalemia   PVD (peripheral vascular disease) (HCC)   Ischemic ulcer diabetic foot (HCC)   Malnutrition of moderate degree  Acute respiratory distress-multifactorial Symptomatic anemia, acute on chronic iron deficiency anemia Acute COPD exacerbation of mild intermittent disease - No obvious signs of blood loss noted.  Colonoscopy 7 years ago was normal according to her.  Nonurgent GI evaluation when respiratory symptoms are better.  This can be done inpatient if it becomes necessary.  Hemoglobin after transfusion this morning is 9.8 -Status post 1 dose of IV iron, will start ferrous sulfate 3 times daily with bowel regimen - Continue prednisone, scheduled and as needed bronchodilators added -Incentive spirometry and flutter valve  Severe peripheral vascular disease with ischemic ulcer,  noninfected -Follows outpatient cardiology, plans for revascularization procedure eventually.  Cardiology was called by the ER provider.  Active tobacco use -Counseled to quit using tobacco.  Essential hypertension -Resume home meds  Diabetes mellitus type 2 - We do not have [place] here.  Holding metformin.  Will use insulin sliding scale for nowAnd if she gets hypoglycemic, will add low basal  Insulin  Generalized weakness -PT/OT recommending home health; ordered  DVT prophylaxis: Subcu heparin Code Status: Full code Family Communication: None at bedside Disposition Plan: To be determined  Consultants:   Cardiology called by the ER  Procedures:   None  Antimicrobials:   None   Subjective: Patient has no new complaints, feels generally weak overall.  Review of Systems Otherwise negative except as per HPI, including: General: Denies fever, chills, night sweats or unintended weight loss. Resp: Denies cough, wheezing, shortness of breath. Cardiac: Denies chest pain, palpitations, orthopnea, paroxysmal nocturnal dyspnea. GI: Denies abdominal pain, nausea, vomiting, diarrhea or constipation GU: Denies dysuria, frequency, hesitancy or incontinence MS: Denies muscle aches, joint pain or swelling Neuro: Denies headache, neurologic deficits (focal weakness, numbness, tingling), abnormal gait Psych: Denies anxiety, depression, SI/HI/AVH Skin: Denies new rashes or lesions ID: Denies sick contacts, exotic exposures, travel  Objective: Vitals:   01/26/18 1303 01/26/18 1612 01/26/18 2125 01/27/18 0427  BP:   (!) 170/85 (!) 161/67  Pulse:   88 90  Resp: (!) 32  19 18  Temp:   98.4 F (36.9 C) 97.9 F (36.6 C)  TempSrc:   Oral Oral  SpO2: (!) 84%  96% 96%  Weight:  73.1 kg      Intake/Output  Summary (Last 24 hours) at 01/27/2018 1211 Last data filed at 01/26/2018 2300 Gross per 24 hour  Intake 640 ml  Output 250 ml  Net 390 ml   Filed Weights   01/26/18 1612   Weight: 73.1 kg    Examination:  General exam: Appears calm and comfortable  Respiratory system: Basilar crackles Cardiovascular system: S1 & S2 heard, RRR. No JVD, murmurs, rubs, gallops or clicks. No pedal edema. Gastrointestinal system: Abdomen is nondistended, soft and nontender. No organomegaly or masses felt. Normal bowel sounds heard. Central nervous system: Alert and oriented. No focal neurological deficits. Extremities: Symmetric 5 x 5 power. Skin: Foot ulcer noted without any evidence of active bleeding or infection. Psychiatry: Judgement and insight appear normal. Mood & affect appropriate.     Data Reviewed:   CBC: Recent Labs  Lab 01/24/18 1526 01/25/18 2127 01/27/18 0505  WBC 3.7 5.4 3.0*  HGB 6.8* 7.2* 9.8*  HCT 21.9* 24.3* 31.4*  MCV 69* 73.9* 76.4*  PLT 184 178 128   Basic Metabolic Panel: Recent Labs  Lab 01/24/18 1526 01/25/18 2127 01/27/18 0505  NA 130* 128* 133*  K 3.4* 3.3* 4.0  CL 87* 88* 93*  CO2 25 29 31   GLUCOSE 80 107* 157*  BUN 10 14 13   CREATININE 0.75 0.77 0.69  CALCIUM 8.3* 8.4* 8.9   GFR: Estimated Creatinine Clearance: 65.1 mL/min (by C-G formula based on SCr of 0.69 mg/dL). Liver Function Tests: Recent Labs  Lab 01/25/18 2127  AST 26  ALT 17  ALKPHOS 51  BILITOT 0.4  PROT 6.8  ALBUMIN 3.2*   No results for input(s): LIPASE, AMYLASE in the last 168 hours. No results for input(s): AMMONIA in the last 168 hours. Coagulation Profile: No results for input(s): INR, PROTIME in the last 168 hours. Cardiac Enzymes: No results for input(s): CKTOTAL, CKMB, CKMBINDEX, TROPONINI in the last 168 hours. BNP (last 3 results) No results for input(s): PROBNP in the last 8760 hours. HbA1C: No results for input(s): HGBA1C in the last 72 hours. CBG: Recent Labs  Lab 01/26/18 1232 01/26/18 1750 01/26/18 2131 01/27/18 0835  GLUCAP 152* 140* 148* 133*   Lipid Profile: No results for input(s): CHOL, HDL, LDLCALC, TRIG, CHOLHDL,  LDLDIRECT in the last 72 hours. Thyroid Function Tests: Recent Labs    01/24/18 1526  TSH 1.140   Anemia Panel: No results for input(s): VITAMINB12, FOLATE, FERRITIN, TIBC, IRON, RETICCTPCT in the last 72 hours. Sepsis Labs: No results for input(s): PROCALCITON, LATICACIDVEN in the last 168 hours.  No results found for this or any previous visit (from the past 240 hour(s)).       Radiology Studies: Dg Chest 2 View  Result Date: 01/26/2018 CLINICAL DATA:  Shortness of breath.  History of COPD and pneumonia. EXAM: CHEST - 2 VIEW COMPARISON:  Chest radiograph April 23, 2016 and CT chest October 08, 2016 FINDINGS: Cardiac silhouette is upper limits of normal size. Calcified aortic arch. Chronic interstitial changes increased lung volumes without pleural effusion or focal consolidation. No pneumothorax. Osteopenia. New age indeterminate T12 severe compression fracture. IMPRESSION: 1. Borderline cardiomegaly and COPD. 2. Osteopenia and age indeterminate severe T12 compression fracture. 3.  Aortic Atherosclerosis (ICD10-I70.0). Electronically Signed   By: Elon Alas M.D.   On: 01/26/2018 01:11   Dg Chest Port 1 View  Result Date: 01/26/2018 CLINICAL DATA:  73 year old female with shortness of breath. EXAM: PORTABLE CHEST 1 VIEW COMPARISON:  01/26/2018 radiographs, chest CT 10/08/2016, and earlier. FINDINGS: Portable AP semi  upright view at 0609 hours. Mildly tortuous thoracic aorta with calcified atherosclerosis. Other mediastinal contours are within normal limits. Large lung volumes. Coarse bilateral pulmonary interstitial opacity appears stable since 2018. No pneumothorax, pulmonary edema, or acute pulmonary opacity. Visualized tracheal air column is within normal limits. Paucity of bowel gas in the upper abdomen. Stable visualized osseous structures. IMPRESSION: Stable chronic lung disease. No acute cardiopulmonary abnormality identified. Electronically Signed   By: Genevie Ann M.D.   On:  01/26/2018 06:38        Scheduled Meds: . amLODipine  5 mg Oral Daily   And  . irbesartan  150 mg Oral Daily  . aspirin EC  81 mg Oral Daily  . atorvastatin  20 mg Oral QPM  . budesonide (PULMICORT) nebulizer solution  0.5 mg Nebulization BID  . doxycycline  100 mg Oral Q12H  . exenatide  10 mcg Subcutaneous BID  . feeding supplement (GLUCERNA SHAKE)  237 mL Oral BID BM  . feeding supplement (PRO-STAT SUGAR FREE 64)  30 mL Oral BID  . ferrous sulfate  325 mg Oral TID WC  . fluticasone furoate-vilanterol  1 puff Inhalation Daily  . heparin  5,000 Units Subcutaneous Q8H  . insulin aspart  0-5 Units Subcutaneous QHS  . insulin aspart  0-9 Units Subcutaneous TID WC  . ipratropium-albuterol  3 mL Nebulization Q6H  . multivitamin with minerals  1 tablet Oral Daily  . nicotine  14 mg Transdermal Daily  . pantoprazole  40 mg Oral Daily  . PARoxetine  30 mg Oral Daily  . predniSONE  40 mg Oral Q breakfast  . silver sulfADIAZINE   Topical BID   Continuous Infusions:   LOS: 1 day   Time spent= 35 mins    Ankit Arsenio Loader, MD [facility]  If 7PM-7AM, please contact night-coverage [email or web address] 01/27/2018, 12:11 PM

## 2018-01-27 NOTE — Telephone Encounter (Signed)
Pt's brother, Gordan Payment states pt will not be making it out of the hospital today to be seen by Dr. Jacqualyn Posey. ATamala Julian - Scheduler canceled today's appt.

## 2018-01-27 NOTE — Care Management Note (Signed)
Case Management Note  Patient Details  Name: [name] MRN: [id number] Date of Birth: 04/19/1945  Subjective/Objective:   Anemia, HTN, COPD, DM                 Action/Plan: NCM spoke to pt and offered choice for HH/CMS list provided and placed on chart. Pt declines HH. Pt states she had [facility] in the past but her brother will be with her at home. Pt has wheelchair, RW, bedside commode, oxygen ([employer]) and neb machine.   PCP  Lujean Amel MD   Expected Discharge Date:                  Expected Discharge Plan:  [place]  In-House Referral:  NA  Discharge planning Services  CM Consult  Post Acute Care Choice:  Home Health Choice offered to:  Patient  DME Arranged:  N/A DME Agency:  NA  HH Arranged:  Patient Refused [place] Agency:  NA  Status of Service:  Completed, signed off  If discussed at [place] of Stay Meetings, dates discussed:    Additional Comments:  Erenest Rasher, RN 01/27/2018, 1:42 PM

## 2018-01-28 LAB — CBC
HCT: 31.3 % — ABNORMAL LOW (ref 36.0–46.0)
Hemoglobin: 9.6 g/dL — ABNORMAL LOW (ref 12.0–15.0)
MCH: 23.9 pg — ABNORMAL LOW (ref 26.0–34.0)
MCHC: 30.7 g/dL (ref 30.0–36.0)
MCV: 77.9 fL — ABNORMAL LOW (ref 80.0–100.0)
Platelets: 171 10*3/uL (ref 150–400)
RBC: 4.02 MIL/uL (ref 3.87–5.11)
RDW: 18.2 % — ABNORMAL HIGH (ref 11.5–15.5)
WBC: 6.3 10*3/uL (ref 4.0–10.5)
nRBC: 0 % (ref 0.0–0.2)

## 2018-01-28 LAB — BASIC METABOLIC PANEL
ANION GAP: 8 (ref 5–15)
BUN: 13 mg/dL (ref 8–23)
CALCIUM: 8.9 mg/dL (ref 8.9–10.3)
CO2: 35 mmol/L — ABNORMAL HIGH (ref 22–32)
CREATININE: 0.63 mg/dL (ref 0.44–1.00)
Chloride: 92 mmol/L — ABNORMAL LOW (ref 98–111)
GFR calc non Af Amer: >60 mL/min (ref >60)
Glucose, Bld: 122 mg/dL — ABNORMAL HIGH (ref 70–99)
Potassium: 3.7 mmol/L (ref 3.5–5.1)
Sodium: 135 mmol/L (ref 135–145)

## 2018-01-28 LAB — BLOOD GAS, VENOUS
Acid-Base Excess: 13.9 mmol/L — ABNORMAL HIGH (ref 0.0–2.0)
Bicarbonate: 39.4 mmol/L — ABNORMAL HIGH (ref 20.0–28.0)
O2 Saturation: 72.7 %
pCO2, Ven: 64.9 mmHg — ABNORMAL HIGH (ref 44.0–60.0)
pH, Ven: 7.4 (ref 7.250–7.430)
pO2, Ven: 40.7 mmHg (ref 32.0–45.0)

## 2018-01-28 LAB — GLUCOSE, CAPILLARY
Glucose-Capillary: 92 mg/dL (ref 70–99)
Glucose-Capillary: 140 mg/dL — ABNORMAL HIGH (ref 70–99)
Glucose-Capillary: 109 mg/dL — ABNORMAL HIGH (ref 70–99)

## 2018-01-28 LAB — MAGNESIUM: Magnesium: 1.5 mg/dL — ABNORMAL LOW (ref 1.7–2.4)

## 2018-01-28 MED ORDER — POTASSIUM CHLORIDE CRYS ER 20 MEQ PO TBCR
40.0000 meq | EXTENDED_RELEASE_TABLET | Freq: Once | ORAL | Status: AC
  Administered 2018-01-28: 40 meq via ORAL
  Filled 2018-01-28: qty 2

## 2018-01-28 MED ORDER — MAGNESIUM SULFATE 2 GM/50ML IV SOLN
2.0000 g | Freq: Once | INTRAVENOUS | Status: AC
  Administered 2018-01-28: 2 g via INTRAVENOUS
  Filled 2018-01-28: qty 50

## 2018-01-28 NOTE — Plan of Care (Signed)
  Problem: Safety: Goal: Ability to remain free from injury will improve Outcome: Progressing   Problem: Skin Integrity: Goal: Risk for impaired skin integrity will decrease Outcome: Progressing   Problem: Breathing Problems - COPD Goal: Knowledge of Plan of Care will increase Outcome: Progressing   Problem: Safety: Goal: Ability to remain free from injury will improve Outcome: Progressing   Problem: Skin Integrity: Goal: Risk for impaired skin integrity will decrease Outcome: Progressing   Problem: Breathing Problems - COPD Goal: Knowledge of Plan of Care will increase Outcome: Progressing

## 2018-01-28 NOTE — Progress Notes (Signed)
Pt resting at this time, accessory muscles used to breath. Pt asleep, no respiratory distress.

## 2018-01-28 NOTE — Progress Notes (Signed)
RN assessed pt at 0600 and heparin was given. Pt denied SOB, resting in the bed. At 0720 during shift report pt requested bedpan. As she is being placed on bedpan, pt started to c/o SOB. Pt noted to be tacypnic, wheezing present in all lobes. SpO2 93 on 3 L O2.  Albuterol breathing treatment given, pt placed on 4L via nasal cannula.  SpO2 96 on 4 L nasal cannula, wheezing improved, pt verbalized some improvement. Respiratory therapist called. Will assess pt.

## 2018-01-28 NOTE — Progress Notes (Addendum)
PROGRESS NOTE    [name]  JIR:[id number] DOB: July 02, 1945 DOA: 01/25/2018 PCP: Lujean Amel, MD   Brief Narrative:  73 year old with past medical history of COPD on 4 L nasal cannula at home, severe peripheral vascular disease, active tobacco use, diabetes mellitus type 2, essential hypertension went to cardiology office for routine appointment was found to have hemoglobin of 6.8.  There is been outpatient discussion about possible stent placement in her lower extremity to help her circulation.  Due to shortness of breath and symptomatic anemia she was sent to the hospital for further care.  In the ER she was noted to be wheezing and slightly tachypneic.  She was started on IV steroids and given bronchodilator treatment.  1 unit of PRBC was transfused.   Assessment & Plan:   Principal Problem:   Anemia Active Problems:   Essential hypertension   Chronic respiratory failure (HCC)   COPD exacerbation (HCC)   Smoker   Dyslipidemia associated with type 2 diabetes mellitus (HCC)   Diabetes mellitus, type II, insulin dependent ([place])   GERD without esophagitis   Hyponatremia   Hypokalemia   PVD (peripheral vascular disease) (HCC)   Ischemic ulcer diabetic foot (HCC)   Malnutrition of moderate degree   Acute respiratory distress-multifactorial Symptomatic anemia, acute on chronic iron deficiency anemia Acute COPD exacerbation of mild intermittent disease - No obvious signs of blood loss noted.  Colonoscopy 7 years ago was normal according to her.  Nonurgent GI evaluation when respiratory symptoms are better.  This can be done inpatient if it becomes necessary But currently H/H stable). - FOBT is pending - Hb stable to 9.6 today, s/p 1 unit pRBC -Status post 1 dose of IV iron, will start ferrous sulfate 3 times daily with bowel regimen - Continue prednisone, doxycycline, scheduled and as needed bronchodilators added -Incentive spirometry and flutter valve  Severe  peripheral vascular disease with ischemic ulcer, noninfected -Follows outpatient cardiology, plans for revascularization procedure eventually.  Cardiology was called by the ER provider per previous hospitalist (but it looks like not planning to follow inpatient, will plan to c/s as needed). - wound c/s, appreciate recs  Active tobacco use -Counseled to quit using tobacco.  Essential hypertension -Resume home meds  Diabetes mellitus type 2 - We do not have [place] here.  Holding metformin.  Will use insulin sliding scale for nowAnd if she gets hyperglycemic, will add low basal  Insulin  Generalized weakness -PT/OT recommending SNF; ordered  NSVT: continue to monitor, replace lytes.  EF is unknown.  Consider starting AV nodal blocker if recurrent and checking echo for EF.   DVT prophylaxis: heparin Code Status: full  Family Communication: none at bedside Disposition Plan: pending further improvement   Consultants:   Cardiology called by ED  Procedures:   none  Antimicrobials:  Anti-infectives (From admission, onward)   Start     Dose/Rate Route Frequency Ordered Stop   01/26/18 1300  doxycycline (VIBRA-TABS) tablet 100 mg     100 mg Oral Every 12 hours 01/26/18 1208 01/31/18 0959     Subjective: C/o persistent SOB Better than on admission, but still presented Uses 4 L O2 at home  Objective: Vitals:   01/28/18 0600 01/28/18 0809 01/28/18 1333 01/28/18 1418  BP: (!) 159/61   137/79  Pulse:    91  Resp:    20  Temp:    98.4 F (36.9 C)  TempSrc:    Oral  SpO2:  97% 97% 97%  Weight:  Intake/Output Summary (Last 24 hours) at 01/28/2018 1550 Last data filed at 01/28/2018 1335 Gross per 24 hour  Intake 270 ml  Output 700 ml  Net -430 ml   Filed Weights   01/26/18 1612  Weight: 73.1 kg    Examination:  General exam: Appears calm and comfortable  Respiratory system: Diffuse wheezing, increased wob Cardiovascular system: S1 & S2 heard, RRR.    Gastrointestinal system: Abdomen is nondistended, soft and nontender.  Central nervous system: Alert and oriented. No focal neurological deficits. Extremities: no lee Skin: No rashes, lesions or ulcers Psychiatry: Judgement and insight appear normal. Mood & affect appropriate.     Data Reviewed: I have personally reviewed following labs and imaging studies  CBC: Recent Labs  Lab 01/24/18 1526 01/25/18 2127 01/27/18 0505 01/28/18 0346  WBC 3.7 5.4 3.0* 6.3  HGB 6.8* 7.2* 9.8* 9.6*  HCT 21.9* 24.3* 31.4* 31.3*  MCV 69* 73.9* 76.4* 77.9*  PLT 184 178 166 235   Basic Metabolic Panel: Recent Labs  Lab 01/24/18 1526 01/25/18 2127 01/27/18 0505 01/28/18 0346  NA 130* 128* 133* 135  K 3.4* 3.3* 4.0 3.7  CL 87* 88* 93* 92*  CO2 25 29 31  35*  GLUCOSE 80 107* 157* 122*  BUN 10 14 13 13   CREATININE 0.75 0.77 0.69 0.63  CALCIUM 8.3* 8.4* 8.9 8.9  MG  --   --   --  1.5*   GFR: Estimated Creatinine Clearance: 65.1 mL/min (by C-G formula based on SCr of 0.63 mg/dL). Liver Function Tests: Recent Labs  Lab 01/25/18 2127  AST 26  ALT 17  ALKPHOS 51  BILITOT 0.4  PROT 6.8  ALBUMIN 3.2*   No results for input(s): LIPASE, AMYLASE in the last 168 hours. No results for input(s): AMMONIA in the last 168 hours. Coagulation Profile: No results for input(s): INR, PROTIME in the last 168 hours. Cardiac Enzymes: No results for input(s): CKTOTAL, CKMB, CKMBINDEX, TROPONINI in the last 168 hours. BNP (last 3 results) No results for input(s): PROBNP in the last 8760 hours. HbA1C: No results for input(s): HGBA1C in the last 72 hours. CBG: Recent Labs  Lab 01/27/18 0835 01/27/18 1148 01/27/18 1640 01/27/18 2053 01/28/18 0828  GLUCAP 133* 125* 154* 155* 92   Lipid Profile: No results for input(s): CHOL, HDL, LDLCALC, TRIG, CHOLHDL, LDLDIRECT in the last 72 hours. Thyroid Function Tests: No results for input(s): TSH, T4TOTAL, FREET4, T3FREE, THYROIDAB in the last 72  hours. Anemia Panel: No results for input(s): VITAMINB12, FOLATE, FERRITIN, TIBC, IRON, RETICCTPCT in the last 72 hours. Sepsis Labs: No results for input(s): PROCALCITON, LATICACIDVEN in the last 168 hours.  No results found for this or any previous visit (from the past 240 hour(s)).       Radiology Studies: No results found.      Scheduled Meds: . amLODipine  5 mg Oral Daily   And  . irbesartan  150 mg Oral Daily  . aspirin EC  81 mg Oral Daily  . atorvastatin  20 mg Oral QPM  . budesonide (PULMICORT) nebulizer solution  0.5 mg Nebulization BID  . doxycycline  100 mg Oral Q12H  . exenatide  10 mcg Subcutaneous BID  . feeding supplement (GLUCERNA SHAKE)  237 mL Oral BID BM  . feeding supplement (PRO-STAT SUGAR FREE 64)  30 mL Oral BID  . ferrous sulfate  325 mg Oral TID WC  . fluticasone furoate-vilanterol  1 puff Inhalation Daily  . heparin  5,000 Units Subcutaneous Q8H  .  insulin aspart  0-5 Units Subcutaneous QHS  . insulin aspart  0-9 Units Subcutaneous TID WC  . ipratropium-albuterol  3 mL Nebulization Q6H  . mouth rinse  15 mL Mouth Rinse BID  . multivitamin with minerals  1 tablet Oral Daily  . nicotine  14 mg Transdermal Daily  . pantoprazole  40 mg Oral Daily  . PARoxetine  30 mg Oral Daily  . predniSONE  40 mg Oral Q breakfast  . silver sulfADIAZINE   Topical BID   Continuous Infusions:   LOS: 2 days    Time spent: over 30 min    Fayrene Helper, MD [facility] Pager [email or web address]  If 7PM-7AM, please contact night-coverage [email or web address] Password [email or web address] 01/28/2018, 3:50 PM

## 2018-01-28 NOTE — Progress Notes (Signed)
Received call from tele that patient had a 13 beat run of V-tach, now back in NSR. Patient lying comfortably in bed. MD Florene Glen aware.

## 2018-01-28 NOTE — Progress Notes (Signed)
Occupational Therapy Treatment Patient Details Name: [name] MRN: [id number] DOB: [birth year] Today's Date: 01/28/2018    History of present illness  73 y.o. female with medical history significant of COPD with chronic hypoxic failure on 4 L oxygen at home, smoker, severe peripheral vascular disease, hypertension, diabetes on oral hypoglycemics and recently developed ischemic ulcer on her foot who presents to the emergency room after having a routine evaluation at cardiology office and found to have hemoglobin of 6.8.   OT comments  Pt very limited by SOB and desat with minimal activity and will need rehab at SNF to facilitate safe DC home. Requires mod A with ADL and is only able to ambulate @10  ft in room with desat on 85 on 4L and 4/4 dyspnea. Pt agreeable to SNF. Will continue to follow acutely.   Follow Up Recommendations  SNF;Supervision/Assistance - 24 hour    Equipment Recommendations  None recommended by OT    Recommendations for Other Services      Precautions / Restrictions Precautions Precautions: Other (comment) Precaution Comments: monitor O2 Restrictions Weight Bearing Restrictions: No       Mobility Bed Mobility Overal bed mobility: Needs Assistance Bed Mobility: Supine to Sit     Supine to sit: Supervision        Transfers Overall transfer level: Needs assistance Equipment used: Rolling walker (2 wheeled) Transfers: Sit to/from Stand Sit to Stand: Min assist              Balance Overall balance assessment: Needs assistance   Sitting balance-Leahy Scale: Good       Standing balance-Leahy Scale: Poor Standing balance comment: dependent on external support                           ADL either performed or assessed with clinical judgement   ADL Overall ADL's : Needs assistance/impaired         Upper Body Bathing: Set up;Supervision/ safety;Sitting   Lower Body Bathing: Moderate assistance;Sit to/from stand    Upper Body Dressing : Minimal assistance;Sitting   Lower Body Dressing: Moderate assistance;Sit to/from stand   Toilet Transfer: Minimal assistance;Ambulation;RW   Toileting- Clothing Manipulation and Hygiene: Minimal assistance;Sit to/from stand       Functional mobility during ADLs: Minimal assistance;Rolling walker;Cueing for safety General ADL Comments: Unable to complete ADL due to SOB and desat to 85 on 4L     Vision       Perception     Praxis      Cognition Arousal/Alertness: Awake/alert Behavior During Therapy: WFL for tasks assessed/performed Overall Cognitive Status: Within Functional Limits for tasks assessed                                          Exercises Exercises: Other exercises Other Exercises Other Exercises: incentive spirometer - unable to pull 149ml Other Exercises: educated on pursed lip breathing   Shoulder Instructions       General Comments O2 94 4L beginning of session with 2/4 dyspnea at rest Desat to 85 wiht minimal activity on 4L with 4/4 dyspnea    Pertinent Vitals/ Pain       Pain Assessment: No/denies pain  Home Living  Prior Functioning/Environment              Frequency  Min 2X/week        Progress Toward Goals  OT Goals(current goals can now be found in the care plan section)  Progress towards OT goals: Not progressing toward goals - comment(medical status)  Acute Rehab OT Goals Patient Stated Goal: to get better OT Goal Formulation: With patient Time For Goal Achievement: 02/09/18 Potential to Achieve Goals: Good ADL Goals Pt Will Perform Lower Body Bathing: with modified independence;sit to/from stand Pt Will Perform Lower Body Dressing: with modified independence;sit to/from stand Pt Will Transfer to Toilet: with modified independence;bedside commode;ambulating Pt Will Perform Toileting - Clothing Manipulation and hygiene: with  modified independence Additional ADL Goal #1: Pt will independently verbalize 3 energy conservation strategies  Plan Discharge plan needs to be updated    Co-evaluation                 AM-PAC OT "6 Clicks" Daily Activity     Outcome Measure   Help from another person eating meals?: None Help from another person taking care of personal grooming?: A Little Help from another person toileting, which includes using toliet, bedpan, or urinal?: A Lot Help from another person bathing (including washing, rinsing, drying)?: A Lot Help from another person to put on and taking off regular upper body clothing?: A Little Help from another person to put on and taking off regular lower body clothing?: A Lot 6 Click Score: 16    End of Session Equipment Utilized During Treatment: Oxygen;Rolling walker(4L)  OT Visit Diagnosis: Unsteadiness on feet (R26.81);Muscle weakness (generalized) (M62.81);Dizziness and giddiness (R42)   Activity Tolerance Patient limited by fatigue   Patient Left in chair;in CPM;with chair alarm set   Nurse Communication Mobility status;Other (comment)(encourage incentive spirometer; change in DC plan)        Time: 0601-5615 OT Time Calculation (min): 37 min  Charges: OT General Charges $OT Visit: 1 Visit OT Treatments $Self Care/Home Management : 23-37 mins  Maurie Boettcher, OT/L   Acute OT Clinical Specialist [place] Pager [phone] Office [phone]    [facility] 01/28/2018, 11:34 AM

## 2018-01-29 LAB — COMPREHENSIVE METABOLIC PANEL
ALT: 17 U/L (ref 0–44)
AST: 20 U/L (ref 15–41)
Albumin: 2.9 g/dL — ABNORMAL LOW (ref 3.5–5.0)
Alkaline Phosphatase: 50 U/L (ref 38–126)
Anion gap: 8 (ref 5–15)
BUN: 14 mg/dL (ref 8–23)
CALCIUM: 9.1 mg/dL (ref 8.9–10.3)
CO2: 37 mmol/L — ABNORMAL HIGH (ref 22–32)
Chloride: 91 mmol/L — ABNORMAL LOW (ref 98–111)
Creatinine, Ser: 0.72 mg/dL (ref 0.44–1.00)
GFR calc Af Amer: >60 mL/min (ref >60)
GFR calc non Af Amer: >60 mL/min (ref >60)
Glucose, Bld: 96 mg/dL (ref 70–99)
Potassium: 5 mmol/L (ref 3.5–5.1)
Sodium: 136 mmol/L (ref 135–145)
TOTAL PROTEIN: 6.2 g/dL — AB (ref 6.5–8.1)
Total Bilirubin: 0.8 mg/dL (ref 0.3–1.2)

## 2018-01-29 LAB — CBC
HCT: 32.8 % — ABNORMAL LOW (ref 36.0–46.0)
Hemoglobin: 9.7 g/dL — ABNORMAL LOW (ref 12.0–15.0)
MCH: 23.3 pg — ABNORMAL LOW (ref 26.0–34.0)
MCHC: 29.6 g/dL — ABNORMAL LOW (ref 30.0–36.0)
MCV: 78.7 fL — ABNORMAL LOW (ref 80.0–100.0)
NRBC: 0 % (ref 0.0–0.2)
Platelets: 178 10*3/uL (ref 150–400)
RBC: 4.17 MIL/uL (ref 3.87–5.11)
RDW: 18.6 % — ABNORMAL HIGH (ref 11.5–15.5)
WBC: 6.3 10*3/uL (ref 4.0–10.5)

## 2018-01-29 LAB — GLUCOSE, CAPILLARY
Glucose-Capillary: 80 mg/dL (ref 70–99)
Glucose-Capillary: 133 mg/dL — ABNORMAL HIGH (ref 70–99)
Glucose-Capillary: 157 mg/dL — ABNORMAL HIGH (ref 70–99)
Glucose-Capillary: 138 mg/dL — ABNORMAL HIGH (ref 70–99)

## 2018-01-29 LAB — MAGNESIUM: Magnesium: 1.6 mg/dL — ABNORMAL LOW (ref 1.7–2.4)

## 2018-01-29 MED ORDER — MAGNESIUM SULFATE 2 GM/50ML IV SOLN
2.0000 g | Freq: Once | INTRAVENOUS | Status: AC
  Administered 2018-01-29: 2 g via INTRAVENOUS
  Filled 2018-01-29: qty 50

## 2018-01-29 MED ORDER — IPRATROPIUM-ALBUTEROL 0.5-2.5 (3) MG/3ML IN SOLN
3.0000 mL | Freq: Three times a day (TID) | RESPIRATORY_TRACT | Status: DC
  Administered 2018-01-30 – 2018-02-01 (×6): 3 mL via RESPIRATORY_TRACT
  Filled 2018-01-29 (×7): qty 3

## 2018-01-29 NOTE — Progress Notes (Signed)
PROGRESS NOTE        PATIENT DETAILS Name: [name] Age: 73 y.o. Sex: female Date of Birth: 1945-09-22 Admit Date: 01/25/2018 Admitting Physician Barb Merino, MD YQM:VHQIONG, Dibas, MD  Brief Narrative: Patient is a 73 y.o. female with history of COPD with chronic hypoxic respiratory failure on 4 L of oxygen at home, DM-2, hypertension, PAD-who was followed by cardiology for a tentative angiography of her lower extremities-incidentally found to have a hemoglobin of 6.8 with COPD exacerbation, she was referred to the hospitalist service for further inpatient evaluation and treatment.  See below for further details 8  Subjective: Feels much better-still wheezing but breathing is now significantly improved and close to baseline.  Assessment/Plan: Microcytic anemia: Iron panel consistent with iron deficiency-however patient denies any overt blood loss.  FOBT negative.  Hemoglobin stable-did require 2 units of PRBC on admission and now on iron supplementation.  Given overall tenuous respiratory status-best to pursue patient endoscopic evaluation-discussed this approach with patient-she is agreeable  COPD exacerbation: Improved-moving air well-only few scattered rhonchi-continue steroids, bronchodilators.  Encourage incentive spirometry and flutter valve.  Chronic hypoxic respiratory failure: On 4 L of oxygen at home  PAD with ischemic ulcer to right great toe: Continue wound care-would need follow-up with cardiology for angiography/revascularization as an outpatient.  Continue aspirin and statin.  Tobacco abuse: Counseled-not sure if he has any intention of quitting  DM-2: CBGs with SSI-follow and adjust accordingly  Hypertension: Controlled-continue with amlodipine and Avapro  Dyslipidemia: Continue statin  Debility/deconditioning: Secondary to acute illness-PT evaluation recommending SNF.  Moderate malnutrition: Continue supplements  DVT  Prophylaxis: Prophylactic Heparin  Code Status: Full code  Family Communication: None at bedside  Disposition Plan: Remain inpatient- SNF on discharge  Antimicrobial agents: Anti-infectives (From admission, onward)   Start     Dose/Rate Route Frequency Ordered Stop   01/26/18 1300  doxycycline (VIBRA-TABS) tablet 100 mg     100 mg Oral Every 12 hours 01/26/18 1208 01/31/18 0959      Procedures: None  CONSULTS:  None  Time spent: 25- minutes-Greater than 50% of this time was spent in counseling, explanation of diagnosis, planning of further management, and coordination of care.  MEDICATIONS: Scheduled Meds: . amLODipine  5 mg Oral Daily   And  . irbesartan  150 mg Oral Daily  . aspirin EC  81 mg Oral Daily  . atorvastatin  20 mg Oral QPM  . budesonide (PULMICORT) nebulizer solution  0.5 mg Nebulization BID  . doxycycline  100 mg Oral Q12H  . feeding supplement (GLUCERNA SHAKE)  237 mL Oral BID BM  . feeding supplement (PRO-STAT SUGAR FREE 64)  30 mL Oral BID  . ferrous sulfate  325 mg Oral TID WC  . fluticasone furoate-vilanterol  1 puff Inhalation Daily  . heparin  5,000 Units Subcutaneous Q8H  . insulin aspart  0-5 Units Subcutaneous QHS  . insulin aspart  0-9 Units Subcutaneous TID WC  . ipratropium-albuterol  3 mL Nebulization Q6H  . mouth rinse  15 mL Mouth Rinse BID  . multivitamin with minerals  1 tablet Oral Daily  . nicotine  14 mg Transdermal Daily  . pantoprazole  40 mg Oral Daily  . PARoxetine  30 mg Oral Daily  . predniSONE  40 mg Oral Q breakfast  . silver sulfADIAZINE   Topical BID  Continuous Infusions: PRN Meds:.acetaminophen **OR** acetaminophen, albuterol, diazepam, guaiFENesin-dextromethorphan, ipratropium-albuterol, methocarbamol, ondansetron **OR** ondansetron (ZOFRAN) IV, polyethylene glycol, senna-docusate   PHYSICAL EXAM: Vital signs: Vitals:   01/29/18 0557 01/29/18 0905 01/29/18 0909 01/29/18 1349  BP: (!) 165/58   (!) 148/62    Pulse: 80   89  Resp:    (!) 22  Temp: 98.2 F (36.8 C)   97.8 F (36.6 C)  TempSrc:    Oral  SpO2: 99% 91% 91% 99%  Weight:       Filed Weights   01/26/18 1612  Weight: 73.1 kg   Body mass index is 24.51 kg/m.   General appearance :Awake, alert, not in any distress. HEENT: Atraumatic and Normocephalic Neck: supple, no JVD.  Resp:Good air entry bilaterally, few scattered rhonchi CVS: S1 S2 regular, no murmurs.  GI: Bowel sounds present, Non tender and not distended with no gaurding, rigidity or rebound.No organomegaly Extremities: B/L Lower Ext shows no edema, both legs are warm to touch Neurology:  speech clear,Non focal, sensation is grossly intact. Musculoskeletal:No digital cyanosis Skin:No Rash, warm and dry Wounds:N/A  I have personally reviewed following labs and imaging studies  LABORATORY DATA: CBC: Recent Labs  Lab 01/24/18 1526 01/25/18 2127 01/27/18 0505 01/28/18 0346 01/29/18 0433  WBC 3.7 5.4 3.0* 6.3 6.3  HGB 6.8* 7.2* 9.8* 9.6* 9.7*  HCT 21.9* 24.3* 31.4* 31.3* 32.8*  MCV 69* 73.9* 76.4* 77.9* 78.7*  PLT 184 178 166 171 063    Basic Metabolic Panel: Recent Labs  Lab 01/24/18 1526 01/25/18 2127 01/27/18 0505 01/28/18 0346 01/29/18 0433  NA 130* 128* 133* 135 136  K 3.4* 3.3* 4.0 3.7 5.0  CL 87* 88* 93* 92* 91*  CO2 25 29 31  35* 37*  GLUCOSE 80 107* 157* 122* 96  BUN 10 14 13 13 14   CREATININE 0.75 0.77 0.69 0.63 0.72  CALCIUM 8.3* 8.4* 8.9 8.9 9.1  MG  --   --   --  1.5* 1.6*    GFR: Estimated Creatinine Clearance: 65.1 mL/min (by C-G formula based on SCr of 0.72 mg/dL).  Liver Function Tests: Recent Labs  Lab 01/25/18 2127 01/29/18 0433  AST 26 20  ALT 17 17  ALKPHOS 51 50  BILITOT 0.4 0.8  PROT 6.8 6.2*  ALBUMIN 3.2* 2.9*   No results for input(s): LIPASE, AMYLASE in the last 168 hours. No results for input(s): AMMONIA in the last 168 hours.  Coagulation Profile: No results for input(s): INR, PROTIME in the last  168 hours.  Cardiac Enzymes: No results for input(s): CKTOTAL, CKMB, CKMBINDEX, TROPONINI in the last 168 hours.  BNP (last 3 results) No results for input(s): PROBNP in the last 8760 hours.  HbA1C: No results for input(s): HGBA1C in the last 72 hours.  CBG: Recent Labs  Lab 01/28/18 0828 01/28/18 1843 01/28/18 2202 01/29/18 0806 01/29/18 1233  GLUCAP 92 140* 109* 80 133*    Lipid Profile: No results for input(s): CHOL, HDL, LDLCALC, TRIG, CHOLHDL, LDLDIRECT in the last 72 hours.  Thyroid Function Tests: No results for input(s): TSH, T4TOTAL, FREET4, T3FREE, THYROIDAB in the last 72 hours.  Anemia Panel: No results for input(s): VITAMINB12, FOLATE, FERRITIN, TIBC, IRON, RETICCTPCT in the last 72 hours.  Urine analysis:    Component Value Date/Time   COLORURINE YELLOW 08/24/2017 2000   APPEARANCEUR CLEAR 08/24/2017 2000   LABSPEC 1.018 08/24/2017 2000   PHURINE 5.0 08/24/2017 2000   GLUCOSEU NEGATIVE 08/24/2017 2000   HGBUR NEGATIVE 08/24/2017 2000  [place] NEGATIVE 08/24/2017 2000   KETONESUR 5 (A) 08/24/2017 2000   PROTEINUR NEGATIVE 08/24/2017 2000   NITRITE NEGATIVE 08/24/2017 2000   LEUKOCYTESUR NEGATIVE 08/24/2017 2000    Sepsis Labs: Lactic Acid, Venous    Component Value Date/Time   LATICACIDVEN 1.4 04/19/2016 0221    MICROBIOLOGY: No results found for this or any previous visit (from the past 240 hour(s)).  RADIOLOGY STUDIES/RESULTS: Dg Chest 2 View  Result Date: 01/26/2018 CLINICAL DATA:  Shortness of breath.  History of COPD and pneumonia. EXAM: CHEST - 2 VIEW COMPARISON:  Chest radiograph April 23, 2016 and CT chest October 08, 2016 FINDINGS: Cardiac silhouette is upper limits of normal size. Calcified aortic arch. Chronic interstitial changes increased lung volumes without pleural effusion or focal consolidation. No pneumothorax. Osteopenia. New age indeterminate T12 severe compression fracture. IMPRESSION: 1. Borderline cardiomegaly and  COPD. 2. Osteopenia and age indeterminate severe T12 compression fracture. 3.  Aortic Atherosclerosis (ICD10-I70.0). Electronically Signed   By: Elon Alas M.D.   On: 01/26/2018 01:11   Dg Chest Port 1 View  Result Date: 01/26/2018 CLINICAL DATA:  74 year old female with shortness of breath. EXAM: PORTABLE CHEST 1 VIEW COMPARISON:  01/26/2018 radiographs, chest CT 10/08/2016, and earlier. FINDINGS: Portable AP semi upright view at 0609 hours. Mildly tortuous thoracic aorta with calcified atherosclerosis. Other mediastinal contours are within normal limits. Large lung volumes. Coarse bilateral pulmonary interstitial opacity appears stable since 2018. No pneumothorax, pulmonary edema, or acute pulmonary opacity. Visualized tracheal air column is within normal limits. Paucity of bowel gas in the upper abdomen. Stable visualized osseous structures. IMPRESSION: Stable chronic lung disease. No acute cardiopulmonary abnormality identified. Electronically Signed   By: Genevie Ann M.D.   On: 01/26/2018 06:38   Dg Foot Complete Right  Result Date: 01/17/2018 Please see detailed radiograph report in office note.  Vas [place] Burnard Bunting With/wo Tbi  Result Date: 01/13/2018 LOWER EXTREMITY DOPPLER STUDY Indications: In 11/2017, an arterial Doppler showed an ABI of .60 on the right              and .89 on the left. Patient denies any leg pain. She does have two              open wounds on the bottom and medial right great toe. High Risk Factors: Hypertension, Diabetes, current smoker.  Other Factors: COPD.  Performing Technologist: Sharlett Iles RVT  Examination Guidelines: A complete evaluation includes at minimum, Doppler waveform signals and systolic blood pressure reading at the level of bilateral brachial, anterior tibial, and posterior tibial arteries, when vessel segments are accessible. Bilateral testing is considered an integral part of a complete examination. Photoelectric Plethysmograph (PPG) waveforms and toe  systolic pressure readings are included as required and additional duplex testing as needed. Limited examinations for reoccurring indications may be performed as noted.  ABI Findings: +---------+------------------+-----+----------+--------+ Right    Rt Pressure (mmHg)IndexWaveform  Comment  +---------+------------------+-----+----------+--------+ Brachial 153                                       +---------+------------------+-----+----------+--------+ ATA      89                0.58 monophasic         +---------+------------------+-----+----------+--------+ PTA      89                0.58 monophasic         +---------+------------------+-----+----------+--------+  PERO     90                0.59 monophasic         +---------+------------------+-----+----------+--------+ Great Toe70                0.46 Abnormal           +---------+------------------+-----+----------+--------+ +---------+------------------+-----+-----------+-------+ Left     Lt Pressure (mmHg)IndexWaveform   Comment +---------+------------------+-----+-----------+-------+ Brachial 150                                       +---------+------------------+-----+-----------+-------+ ATA      125               0.82 multiphasic        +---------+------------------+-----+-----------+-------+ PTA      128               0.84 multiphasic        +---------+------------------+-----+-----------+-------+ PERO     125               0.82 biphasic           +---------+------------------+-----+-----------+-------+ Great Toe76                0.50 Abnormal           +---------+------------------+-----+-----------+-------+ +-------+-----------+-----------+------------+------------+ ABI/TBIToday's ABIToday's TBIPrevious ABIPrevious TBI +-------+-----------+-----------+------------+------------+ Right  .59        .46        .60         .55           +-------+-----------+-----------+------------+------------+ Left   .84        .50        .89         .0           +-------+-----------+-----------+------------+------------+ Right ABIs appear essentially unchanged compared to prior study on 11/04/2017. Left ABIs appear essentially unchanged compared to prior study on 11/04/2017.  Summary: Right: Resting right ankle-brachial index indicates moderate right lower extremity arterial disease. The right toe-brachial index is abnormal. Left: Resting left ankle-brachial index indicates mild left lower extremity arterial disease. The left toe-brachial index is abnormal.  *See table(s) above for measurements and observations. See Arterial duplex.  Vascular consult recommended. Electronically signed by Ena Dawley MD on 01/13/2018 at 1:15:32 PM.    Final (Updating)    Vas [place] Lower Extremity Arterial Duplex  Result Date: 01/17/2018 LOWER EXTREMITY ARTERIAL DUPLEX STUDY Indications: In 11/2017, an arterial Doppler showed an ABI of .60 on the right              and .89 on the left. Patient denies any leg pain. She does have two              open wounds on the bottom and medial right great toe. High Risk Factors: Hypertension, Diabetes, current smoker.  Other Factors: COPD.  Current ABI: .59 on the right and .84 on the left. Performing Technologist: Sharlett Iles RVT  Examination Guidelines: A complete evaluation includes B-mode imaging, spectral Doppler, color Doppler, and power Doppler as needed of all accessible portions of each vessel. Bilateral testing is considered an integral part of a complete examination. Limited examinations for reoccurring indications may be performed as noted.  Right Duplex Findings: +-----------+--------+-----+---------------+----------+--------+            PSV cm/sRatioStenosis       Waveform  Comments +-----------+--------+-----+---------------+----------+--------+ CFA Prox   95                          monophasic          +-----------+--------+-----+---------------+----------+--------+ DFA        75                          monophasic         +-----------+--------+-----+---------------+----------+--------+ SFA Prox   259          50-74% stenosismonophasic         +-----------+--------+-----+---------------+----------+--------+ SFA Mid    83                          monophasic         +-----------+--------+-----+---------------+----------+--------+ SFA Distal 98                          monophasic         +-----------+--------+-----+---------------+----------+--------+ POP Prox   71                          monophasic         +-----------+--------+-----+---------------+----------+--------+ POP Mid    62                          monophasic         +-----------+--------+-----+---------------+----------+--------+ POP Distal 58                          monophasic         +-----------+--------+-----+---------------+----------+--------+ ATA Prox   69                          monophasic         +-----------+--------+-----+---------------+----------+--------+ ATA Mid    91                          monophasic         +-----------+--------+-----+---------------+----------+--------+ ATA Distal 64                          monophasic         +-----------+--------+-----+---------------+----------+--------+ PTA Prox   65                          monophasic         +-----------+--------+-----+---------------+----------+--------+ PTA Mid    56                          monophasic         +-----------+--------+-----+---------------+----------+--------+ PTA Distal 44                          monophasic         +-----------+--------+-----+---------------+----------+--------+ PERO Prox  39                          monophasic         +-----------+--------+-----+---------------+----------+--------+ PERO Mid   62  monophasic          +-----------+--------+-----+---------------+----------+--------+ PERO Distal18                          monophasic         +-----------+--------+-----+---------------+----------+--------+ Mixed plaque throughout. A focal velocity elevation of 259 cm/s was obtained at proximal SFA distal to the ostium with post stenotic turbulence with a VR of 3.3. Findings are characteristic of 50-74% stenosis.  Left Duplex Findings: +----------+--------+-----+---------------+--------+---------+           PSV cm/sRatioStenosis       WaveformComments  +----------+--------+-----+---------------+--------+---------+ CFA Prox  179                         biphasicturbulent +----------+--------+-----+---------------+--------+---------+ DFA       75                          biphasic          +----------+--------+-----+---------------+--------+---------+ SFA Prox  240          50-74% stenosisbiphasic          +----------+--------+-----+---------------+--------+---------+ SFA Mid   80                          biphasic          +----------+--------+-----+---------------+--------+---------+ SFA Distal69                          biphasic          +----------+--------+-----+---------------+--------+---------+ POP Prox  58                          biphasic          +----------+--------+-----+---------------+--------+---------+ POP Mid   48                          biphasic          +----------+--------+-----+---------------+--------+---------+ POP Distal44                          biphasic          +----------+--------+-----+---------------+--------+---------+ ATA Prox  55                          biphasic          +----------+--------+-----+---------------+--------+---------+ ATA Mid   63                          biphasic          +----------+--------+-----+---------------+--------+---------+ ATA Distal45                          biphasic           +----------+--------+-----+---------------+--------+---------+ PTA Prox  37                          biphasic          +----------+--------+-----+---------------+--------+---------+ PTA Mid   67                          biphasic          +----------+--------+-----+---------------+--------+---------+  PTA Distal37                          biphasic          +----------+--------+-----+---------------+--------+---------+ PERO Mid  NV                                            +----------+--------+-----+---------------+--------+---------+ Mixed plaque throughout. A focal velocity elevation of 240 cm/s was obtained at proximal SFA distal to the ostium with post stenotic turbulence with a VR of 2.0. Findings are characteristic of 50-74% stenosis.  Technically challenging study due to patient movement. Right leg spasms, left leg cramping and back pain.  Summary: Right: 50-74% stenosis noted in the proximal SFA distal to the ostium. Three vessel run-off. Left: 50-74% stenosis noted in the proximal SFA distal to the ostium. At least a two vessel run-off. Peroneal artery not visualized.  See table(s) above for measurements and observations. See ABI results.  Suggest follow up Aortoiliac Duplex based on abnormal bilateral common femoral artery waveforms.  Vascular consult recommended. Electronically signed by Ena Dawley MD on 01/17/2018 at 1:00:30 PM.    Final    Vas US Aorta/ivc/iliacs  Result Date: 01/20/2018 ABDOMINAL AORTA STUDY Indications: On 01/13/2018, a lower arterial duplex showed 50-74% stenosis in the              bilateral proximal SFA, distal to the ostium. Monophasic flow is              noted throughout the right leg and biphasic flow throughout the              left. Patient denies any leg pain. She does have two open wounds on              the bottom and medial right great toe.               ABIs performed on 01/13/2018 showed .59 on the right and .84 on the              left.  Risk Factors: Hypertension, Diabetes, current smoker. Other Factors: COPD.  Limitations: Air/bowel gas.   Performing Technologist: Sharlett Iles RVT  Examination Guidelines: A complete evaluation includes B-mode imaging, spectral Doppler, color Doppler, and power Doppler as needed of all accessible portions of each vessel. Bilateral testing is considered an integral part of a complete examination. Limited examinations for reoccurring indications may be performed as noted.  Abdominal Aorta Findings: +-------------+-------+----------+----------+----------+--------+--------+ Location     AP (cm)Trans (cm)PSV (cm/s)Waveform  ThrombusComments +-------------+-------+----------+----------+----------+--------+--------+ Proximal     1.90   1.90      86                                   +-------------+-------+----------+----------+----------+--------+--------+ Mid          2.50   2.50      61                                   +-------------+-------+----------+----------+----------+--------+--------+ Distal       1.70   1.70      73  ectatic  +-------------+-------+----------+----------+----------+--------+--------+ RT CIA Prox                   166       monophasic                 +-------------+-------+----------+----------+----------+--------+--------+ RT CIA Mid                    328       monophasic                 +-------------+-------+----------+----------+----------+--------+--------+ RT CIA Distal                 380       monophasic                 +-------------+-------+----------+----------+----------+--------+--------+ RT EIA Prox                   128       monophasic                 +-------------+-------+----------+----------+----------+--------+--------+ RT EIA Mid                    108       monophasic                 +-------------+-------+----------+----------+----------+--------+--------+ RT EIA Distal                  93        monophasic                 +-------------+-------+----------+----------+----------+--------+--------+ LT CIA Prox                   100       biphasic                   +-------------+-------+----------+----------+----------+--------+--------+ LT CIA Mid                    314       biphasic                   +-------------+-------+----------+----------+----------+--------+--------+ LT CIA Distal                 212       biphasic                   +-------------+-------+----------+----------+----------+--------+--------+ LT EIA Prox                   217       biphasic                   +-------------+-------+----------+----------+----------+--------+--------+ LT EIA Mid                    212       biphasic                   +-------------+-------+----------+----------+----------+--------+--------+ LT EIA Distal                 172       biphasic                   +-------------+-------+----------+----------+----------+--------+--------+ Aortoiliac atherosclerosis with elevated velocities in the bilateral common iliac arteries and left external iliac artery. Abnormal tapering of the abdominal aorta; mid aorta is ectatic, measuring 2.5 cm.  IVC/Iliac Findings: +--------+------+--------+--------+  IVC   PatentThrombusComments +--------+------+--------+--------+ IVC Proxpatent                 +--------+------+--------+--------+  Technically challenging study due to patient movement. Right leg spasms and back pain. Summary: Abdominal Aorta: The mid abdominal aorta is ectatic, measuring 2.5 cm. Stenosis: +-------------------+----------------------------+ Location           Stenosis                     +-------------------+----------------------------+ Right Common Iliac >50% stenosis                +-------------------+----------------------------+ Left Common Iliac  >50% stenosis                 +-------------------+----------------------------+ Left External Iliac>50% stenosis, low end range +-------------------+----------------------------+  Patent IVC. *See table(s) above for measurements and observations. Vascular consult recommended.  Electronically signed by Quay Burow MD on 01/20/2018 at 8:00:51 AM.    Final      LOS: 3 days   Oren Binet, MD  [facility]  If 7PM-7AM, please contact night-coverage  Please page via [email or web address] [email or web address]-click on MD name and type text message  01/29/2018, 2:18 PM

## 2018-01-30 ENCOUNTER — Ambulatory Visit (HOSPITAL_COMMUNITY): Admission: RE | Admit: 2018-01-30 | Payer: Medicare HMO | Source: Home / Self Care | Admitting: Cardiovascular Disease

## 2018-01-30 ENCOUNTER — Encounter (HOSPITAL_COMMUNITY)
Admission: EM | Disposition: A | Discharge status: Skilled Nursing Facility | Payer: Self-pay | Source: Home / Self Care | Attending: Internal Medicine

## 2018-01-30 LAB — GLUCOSE, CAPILLARY
Glucose-Capillary: 132 mg/dL — ABNORMAL HIGH (ref 70–99)
Glucose-Capillary: 93 mg/dL (ref 70–99)
Glucose-Capillary: 275 mg/dL — ABNORMAL HIGH (ref 70–99)
Glucose-Capillary: 151 mg/dL — ABNORMAL HIGH (ref 70–99)
Glucose-Capillary: 107 mg/dL — ABNORMAL HIGH (ref 70–99)

## 2018-01-30 SURGERY — ABDOMINAL AORTOGRAM W/LOWER EXTREMITY
Anesthesia: LOCAL

## 2018-01-30 MED ORDER — ASPIRIN 81 MG PO CHEW: 81.0000 mg | CHEWABLE_TABLET | ORAL | Status: AC

## 2018-01-30 MED ORDER — SODIUM CHLORIDE 0.9 % WEIGHT BASED INFUSION: 3.0000 mL/kg/h | INTRAVENOUS | Status: AC

## 2018-01-30 MED ORDER — SODIUM CHLORIDE 0.9% FLUSH: 3.0000 mL | INTRAVENOUS | Status: DC | PRN

## 2018-01-30 MED ORDER — ASPIRIN 81 MG PO CHEW: 81.0000 mg | CHEWABLE_TABLET | ORAL | Status: DC

## 2018-01-30 MED ORDER — SODIUM CHLORIDE 0.9% FLUSH
3.0000 mL | Freq: Two times a day (BID) | INTRAVENOUS | Status: DC
  Administered 2018-01-30 – 2018-02-03 (×9): 3 mL via INTRAVENOUS

## 2018-01-30 MED ORDER — SODIUM CHLORIDE 0.9 % IV SOLN: 250.0000 mL | INTRAVENOUS | Status: DC | PRN

## 2018-01-30 MED ORDER — SODIUM CHLORIDE 0.9 % WEIGHT BASED INFUSION: 1.0000 mL/kg/h | INTRAVENOUS | Status: DC

## 2018-01-30 NOTE — Progress Notes (Signed)
Ms. Laningham was admitted with COPD exacerbation and symptomatic anemia requiring transfusion.  The etiology of her anemia is still undetermined and is being worked up.  She has ongoing tobacco abuse.  She was scheduled to undergo angiography today for evaluation of critical limb ischemia however given the events of this hospitalization I suspect that we should put this on hold until her anemia is better characterized.  The wound on her right hallux is chronic and has not really changed nor does it appear infected.  I suspect she has a right iliac lesion which can be addressed in the future after her other medical problems are adequately addressed  Lorretta Harp, M.D., [place], [facility], [address] [place], [place]  [id number]  [phone] 01/30/2018 7:18 AM

## 2018-01-30 NOTE — Progress Notes (Signed)
PROGRESS NOTE        PATIENT DETAILS Name: [name] Age: 73 y.o. Sex: female Date of Birth: Aug 30, 1945 Admit Date: 01/25/2018 Admitting Physician Barb Merino, MD ZCH:[id number], D[place], MD  Brief Narrative: Patient is a 73 y.o. female with history of COPD with chronic hypoxic respiratory failure on 4 L of oxygen at home, DM-2, hypertension, PAD-who was followed by cardiology for a tentative angiography of her lower extremities-incidentally found to have a hemoglobin of 6.8 with COPD exacerbation, she was referred to the hospitalist service for further inpatient evaluation and treatment.  See below for further details 8  Subjective: Improved-breathing is much better feels weak-she is agreeable to go to SNF if it is needed.  Assessment/Plan: Microcytic anemia: Iron panel consistent with iron deficiency-however patient denies any overt blood loss.  FOBT negative.  Hemoglobin stable-did require 2 units of PRBC on admission and now on iron supplementation.  Given overall tenuous respiratory status-best to pursue outpatient endoscopic evaluation once she has recovered completely from COPD exacerbation.  Discussed at length with patient-she is agreeable with this approach.    COPD exacerbation: Continues to improve-few scattered rhonchi-moving air well-Taper steroids further bronchodilators.  Encourage incentive spirometry and flutter valve.  Chronic hypoxic respiratory failure: On 4 L of oxygen at home.  PAD with ischemic ulcer to right great toe: Continue aspirin and statin-wound care per wound care orders.  Follow with cardiology in the outpatient setting for angiography.    Tobacco abuse: Counseled-not sure if she has any intention of quitting.  DM-2: CBG stable-continue SSI.  Hypertension: Controlled-continue with amlodipine and Avapro  Dyslipidemia: Continue statin  Debility/deconditioning: Secondary to acute illness-PT evaluation recommending SNF.   Social worker following.  Moderate malnutrition: Continue supplements.  DVT Prophylaxis: Prophylactic Heparin  Code Status: Full code  Family Communication: None at bedside  Disposition Plan: Remain inpatient- SNF on discharge  Antimicrobial agents: Anti-infectives (From admission, onward)   Start     Dose/Rate Route Frequency Ordered Stop   01/26/18 1300  doxycycline (VIBRA-TABS) tablet 100 mg     100 mg Oral Every 12 hours 01/26/18 1208 01/31/18 0959      Procedures: None  CONSULTS:  None  Time spent: 25- minutes-Greater than 50% of this time was spent in counseling, explanation of diagnosis, planning of further management, and coordination of care.  MEDICATIONS: Scheduled Meds: . amLODipine  5 mg Oral Daily   And  . irbesartan  150 mg Oral Daily  . aspirin  81 mg Oral Pre-Cath  . aspirin EC  81 mg Oral Daily  . atorvastatin  20 mg Oral QPM  . budesonide (PULMICORT) nebulizer solution  0.5 mg Nebulization BID  . doxycycline  100 mg Oral Q12H  . feeding supplement (GLUCERNA SHAKE)  237 mL Oral BID BM  . feeding supplement (PRO-STAT SUGAR FREE 64)  30 mL Oral BID  . ferrous sulfate  325 mg Oral TID WC  . fluticasone furoate-vilanterol  1 puff Inhalation Daily  . heparin  5,000 Units Subcutaneous Q8H  . insulin aspart  0-5 Units Subcutaneous QHS  . insulin aspart  0-9 Units Subcutaneous TID WC  . ipratropium-albuterol  3 mL Nebulization TID  . mouth rinse  15 mL Mouth Rinse BID  . multivitamin with minerals  1 tablet Oral Daily  . nicotine  14 mg Transdermal Daily  . pantoprazole  40 mg Oral Daily  . PARoxetine  30 mg Oral Daily  . silver sulfADIAZINE   Topical BID  . sodium chloride flush  3 mL Intravenous Q12H   Continuous Infusions: . sodium chloride    . sodium chloride     PRN Meds:.sodium chloride, acetaminophen **OR** acetaminophen, albuterol, diazepam, guaiFENesin-dextromethorphan, ipratropium-albuterol, methocarbamol, ondansetron **OR**  ondansetron (ZOFRAN) IV, polyethylene glycol, senna-docusate, sodium chloride flush   PHYSICAL EXAM: Vital signs: Vitals:   01/29/18 2321 01/30/18 0615 01/30/18 1355 01/30/18 1404  BP: (!) 162/67 (!) 164/72 134/67   Pulse:  79 95   Resp:  20 20   Temp:  98.5 F (36.9 C) 98.1 F (36.7 C)   TempSrc:   Oral   SpO2:  99% 99% 99%  Weight:       Filed Weights   01/26/18 1612  Weight: 73.1 kg   Body mass index is 24.51 kg/m.   General appearance:Awake, alert, not in any distress.  Chronically sick appearing. Eyes:no scleral icterus. HEENT: Atraumatic and Normocephalic Neck: supple, no JVD. Resp:Good air entry bilaterally, scattered rales. CVS: S1 S2 regular, no murmurs.  GI: Bowel sounds present, Non tender and not distended with no gaurding, rigidity or rebound. Extremities: B/L Lower Ext shows no edema, both legs are warm to touch Neurology:  Non focal Psychiatric: Normal judgment and insight. Normal mood. Musculoskeletal:No digital cyanosis Skin:No Rash, warm and dry Wounds:N/A  I have personally reviewed following labs and imaging studies  LABORATORY DATA: CBC: Recent Labs  Lab 01/24/18 1526 01/25/18 2127 01/27/18 0505 01/28/18 0346 01/29/18 0433  WBC 3.7 5.4 3.0* 6.3 6.3  HGB 6.8* 7.2* 9.8* 9.6* 9.7*  HCT 21.9* 24.3* 31.4* 31.3* 32.8*  MCV 69* 73.9* 76.4* 77.9* 78.7*  PLT 184 178 166 171 902    Basic Metabolic Panel: Recent Labs  Lab 01/24/18 1526 01/25/18 2127 01/27/18 0505 01/28/18 0346 01/29/18 0433  NA 130* 128* 133* 135 136  K 3.4* 3.3* 4.0 3.7 5.0  CL 87* 88* 93* 92* 91*  CO2 25 29 31  35* 37*  GLUCOSE 80 107* 157* 122* 96  BUN 10 14 13 13 14   CREATININE 0.75 0.77 0.69 0.63 0.72  CALCIUM 8.3* 8.4* 8.9 8.9 9.1  MG  --   --   --  1.5* 1.6*    GFR: Estimated Creatinine Clearance: 65.1 mL/min (by C-G formula based on SCr of 0.72 mg/dL).  Liver Function Tests: Recent Labs  Lab 01/25/18 2127 01/29/18 0433  AST 26 20  ALT 17 17  ALKPHOS  51 50  BILITOT 0.4 0.8  PROT 6.8 6.2*  ALBUMIN 3.2* 2.9*   No results for input(s): LIPASE, AMYLASE in the last 168 hours. No results for input(s): AMMONIA in the last 168 hours.  Coagulation Profile: No results for input(s): INR, PROTIME in the last 168 hours.  Cardiac Enzymes: No results for input(s): CKTOTAL, CKMB, CKMBINDEX, TROPONINI in the last 168 hours.  BNP (last 3 results) No results for input(s): PROBNP in the last 8760 hours.  HbA1C: No results for input(s): HGBA1C in the last 72 hours.  CBG: Recent Labs  Lab 01/29/18 1233 01/29/18 1651 01/29/18 2234 01/30/18 0806 01/30/18 1146  GLUCAP 133* 157* 138* 93 275*    Lipid Profile: No results for input(s): CHOL, HDL, LDLCALC, TRIG, CHOLHDL, LDLDIRECT in the last 72 hours.  Thyroid Function Tests: No results for input(s): TSH, T4TOTAL, FREET4, T3FREE, THYROIDAB in the last 72 hours.  Anemia Panel: No results for input(s): VITAMINB12, FOLATE, FERRITIN,  TIBC, IRON, RETICCTPCT in the last 72 hours.  Urine analysis:    Component Value Date/Time   COLORURINE YELLOW 08/24/2017 2000   APPEARANCEUR CLEAR 08/24/2017 2000   LABSPEC 1.018 08/24/2017 2000   PHURINE 5.0 08/24/2017 2000   GLUCOSEU NEGATIVE 08/24/2017 2000   HGBUR NEGATIVE 08/24/2017 2000   BILIRUBINUR NEGATIVE 08/24/2017 2000   KETONESUR 5 (A) 08/24/2017 2000   PROTEINUR NEGATIVE 08/24/2017 2000   NITRITE NEGATIVE 08/24/2017 2000   LEUKOCYTESUR NEGATIVE 08/24/2017 2000    Sepsis Labs: Lactic Acid, Venous    Component Value Date/Time   LATICACIDVEN 1.4 04/19/2016 0221    MICROBIOLOGY: No results found for this or any previous visit (from the past 240 hour(s)).  RADIOLOGY STUDIES/RESULTS: Dg Chest 2 View  Result Date: 01/26/2018 CLINICAL DATA:  Shortness of breath.  History of COPD and pneumonia. EXAM: CHEST - 2 VIEW COMPARISON:  Chest radiograph April 23, 2016 and CT chest October 08, 2016 FINDINGS: Cardiac silhouette is upper limits of  normal size. Calcified aortic arch. Chronic interstitial changes increased lung volumes without pleural effusion or focal consolidation. No pneumothorax. Osteopenia. New age indeterminate T12 severe compression fracture. IMPRESSION: 1. Borderline cardiomegaly and COPD. 2. Osteopenia and age indeterminate severe T12 compression fracture. 3.  Aortic Atherosclerosis (ICD10-I70.0). Electronically Signed   By: Elon Alas M.D.   On: 01/26/2018 01:11   Dg Chest Port 1 View  Result Date: 01/26/2018 CLINICAL DATA:  73 year old female with shortness of breath. EXAM: PORTABLE CHEST 1 VIEW COMPARISON:  01/26/2018 radiographs, chest CT 10/08/2016, and earlier. FINDINGS: Portable AP semi upright view at 0609 hours. Mildly tortuous thoracic aorta with calcified atherosclerosis. Other mediastinal contours are within normal limits. Large lung volumes. Coarse bilateral pulmonary interstitial opacity appears stable since 2018. No pneumothorax, pulmonary edema, or acute pulmonary opacity. Visualized tracheal air column is within normal limits. Paucity of bowel gas in the upper abdomen. Stable visualized osseous structures. IMPRESSION: Stable chronic lung disease. No acute cardiopulmonary abnormality identified. Electronically Signed   By: Genevie Ann M.D.   On: 01/26/2018 06:38   Dg Foot Complete Right  Result Date: 01/17/2018 Please see detailed radiograph report in office note.  Vas [place] Burnard Bunting With/wo Tbi  Result Date: 01/13/2018 LOWER EXTREMITY DOPPLER STUDY Indications: In 11/2017, an arterial Doppler showed an ABI of .60 on the right              and .89 on the left. Patient denies any leg pain. She does have two              open wounds on the bottom and medial right great toe. High Risk Factors: Hypertension, Diabetes, current smoker.  Other Factors: COPD.  Performing Technologist: Sharlett Iles RVT  Examination Guidelines: A complete evaluation includes at minimum, Doppler waveform signals and systolic blood  pressure reading at the level of bilateral brachial, anterior tibial, and posterior tibial arteries, when vessel segments are accessible. Bilateral testing is considered an integral part of a complete examination. Photoelectric Plethysmograph (PPG) waveforms and toe systolic pressure readings are included as required and additional duplex testing as needed. Limited examinations for reoccurring indications may be performed as noted.  ABI Findings: +---------+------------------+-----+----------+--------+ Right    Rt Pressure (mmHg)IndexWaveform  Comment  +---------+------------------+-----+----------+--------+ Brachial 153                                       +---------+------------------+-----+----------+--------+ ATA  89                0.58 monophasic         +---------+------------------+-----+----------+--------+ PTA      89                0.58 monophasic         +---------+------------------+-----+----------+--------+ PERO     90                0.59 monophasic         +---------+------------------+-----+----------+--------+ Great Toe70                0.46 Abnormal           +---------+------------------+-----+----------+--------+ +---------+------------------+-----+-----------+-------+ Left     Lt Pressure (mmHg)IndexWaveform   Comment +---------+------------------+-----+-----------+-------+ Brachial 150                                       +---------+------------------+-----+-----------+-------+ ATA      125               0.82 multiphasic        +---------+------------------+-----+-----------+-------+ PTA      128               0.84 multiphasic        +---------+------------------+-----+-----------+-------+ PERO     125               0.82 biphasic           +---------+------------------+-----+-----------+-------+ Great Toe76                0.50 Abnormal           +---------+------------------+-----+-----------+-------+  +-------+-----------+-----------+------------+------------+ ABI/TBIToday's ABIToday's TBIPrevious ABIPrevious TBI +-------+-----------+-----------+------------+------------+ Right  .59        .46        .60         .55          +-------+-----------+-----------+------------+------------+ Left   .84        .50        .89         .0           +-------+-----------+-----------+------------+------------+ Right ABIs appear essentially unchanged compared to prior study on 11/04/2017. Left ABIs appear essentially unchanged compared to prior study on 11/04/2017.  Summary: Right: Resting right ankle-brachial index indicates moderate right lower extremity arterial disease. The right toe-brachial index is abnormal. Left: Resting left ankle-brachial index indicates mild left lower extremity arterial disease. The left toe-brachial index is abnormal.  *See table(s) above for measurements and observations. See Arterial duplex.  Vascular consult recommended. Electronically signed by Ena Dawley MD on 01/13/2018 at 1:15:32 PM.    Final (Updating)    Vas [place] Lower Extremity Arterial Duplex  Result Date: 01/17/2018 LOWER EXTREMITY ARTERIAL DUPLEX STUDY Indications: In 11/2017, an arterial Doppler showed an ABI of .60 on the right              and .89 on the left. Patient denies any leg pain. She does have two              open wounds on the bottom and medial right great toe. High Risk Factors: Hypertension, Diabetes, current smoker.  Other Factors: COPD.  Current ABI: .59 on the right and .84 on the left. Performing Technologist: Sharlett Iles RVT  Examination Guidelines: A complete evaluation includes B-mode imaging, spectral Doppler, color  Doppler, and power Doppler as needed of all accessible portions of each vessel. Bilateral testing is considered an integral part of a complete examination. Limited examinations for reoccurring indications may be performed as noted.  Right Duplex Findings:  +-----------+--------+-----+---------------+----------+--------+            PSV cm/sRatioStenosis       Waveform  Comments +-----------+--------+-----+---------------+----------+--------+ CFA Prox   95                          monophasic         +-----------+--------+-----+---------------+----------+--------+ DFA        75                          monophasic         +-----------+--------+-----+---------------+----------+--------+ SFA Prox   259          50-74% stenosismonophasic         +-----------+--------+-----+---------------+----------+--------+ SFA Mid    83                          monophasic         +-----------+--------+-----+---------------+----------+--------+ SFA Distal 98                          monophasic         +-----------+--------+-----+---------------+----------+--------+ POP Prox   71                          monophasic         +-----------+--------+-----+---------------+----------+--------+ POP Mid    62                          monophasic         +-----------+--------+-----+---------------+----------+--------+ POP Distal 58                          monophasic         +-----------+--------+-----+---------------+----------+--------+ ATA Prox   69                          monophasic         +-----------+--------+-----+---------------+----------+--------+ ATA Mid    91                          monophasic         +-----------+--------+-----+---------------+----------+--------+ ATA Distal 64                          monophasic         +-----------+--------+-----+---------------+----------+--------+ PTA Prox   65                          monophasic         +-----------+--------+-----+---------------+----------+--------+ PTA Mid    56                          monophasic         +-----------+--------+-----+---------------+----------+--------+ PTA Distal 44                          monophasic          +-----------+--------+-----+---------------+----------+--------+  PERO Prox  39                          monophasic         +-----------+--------+-----+---------------+----------+--------+ PERO Mid   62                          monophasic         +-----------+--------+-----+---------------+----------+--------+ PERO Distal18                          monophasic         +-----------+--------+-----+---------------+----------+--------+ Mixed plaque throughout. A focal velocity elevation of 259 cm/s was obtained at proximal SFA distal to the ostium with post stenotic turbulence with a VR of 3.3. Findings are characteristic of 50-74% stenosis.  Left Duplex Findings: +----------+--------+-----+---------------+--------+---------+           PSV cm/sRatioStenosis       WaveformComments  +----------+--------+-----+---------------+--------+---------+ CFA Prox  179                         biphasicturbulent +----------+--------+-----+---------------+--------+---------+ DFA       75                          biphasic          +----------+--------+-----+---------------+--------+---------+ SFA Prox  240          50-74% stenosisbiphasic          +----------+--------+-----+---------------+--------+---------+ SFA Mid   80                          biphasic          +----------+--------+-----+---------------+--------+---------+ SFA Distal69                          biphasic          +----------+--------+-----+---------------+--------+---------+ POP Prox  58                          biphasic          +----------+--------+-----+---------------+--------+---------+ POP Mid   48                          biphasic          +----------+--------+-----+---------------+--------+---------+ POP Distal44                          biphasic          +----------+--------+-----+---------------+--------+---------+ ATA Prox  55                          biphasic           +----------+--------+-----+---------------+--------+---------+ ATA Mid   63                          biphasic          +----------+--------+-----+---------------+--------+---------+ ATA Distal45                          biphasic          +----------+--------+-----+---------------+--------+---------+ PTA Prox  37  biphasic          +----------+--------+-----+---------------+--------+---------+ PTA Mid   67                          biphasic          +----------+--------+-----+---------------+--------+---------+ PTA Distal37                          biphasic          +----------+--------+-----+---------------+--------+---------+ PERO Mid  NV                                            +----------+--------+-----+---------------+--------+---------+ Mixed plaque throughout. A focal velocity elevation of 240 cm/s was obtained at proximal SFA distal to the ostium with post stenotic turbulence with a VR of 2.0. Findings are characteristic of 50-74% stenosis.  Technically challenging study due to patient movement. Right leg spasms, left leg cramping and back pain.  Summary: Right: 50-74% stenosis noted in the proximal SFA distal to the ostium. Three vessel run-off. Left: 50-74% stenosis noted in the proximal SFA distal to the ostium. At least a two vessel run-off. Peroneal artery not visualized.  See table(s) above for measurements and observations. See ABI results.  Suggest follow up Aortoiliac Duplex based on abnormal bilateral common femoral artery waveforms.  Vascular consult recommended. Electronically signed by Ena Dawley MD on 01/17/2018 at 1:00:30 PM.    Final    Vas US Aorta/ivc/iliacs  Result Date: 01/20/2018 ABDOMINAL AORTA STUDY Indications: On 01/13/2018, a lower arterial duplex showed 50-74% stenosis in the              bilateral proximal SFA, distal to the ostium. Monophasic flow is              noted throughout the right leg and biphasic  flow throughout the              left. Patient denies any leg pain. She does have two open wounds on              the bottom and medial right great toe.               ABIs performed on 01/13/2018 showed .59 on the right and .84 on the              left. Risk Factors: Hypertension, Diabetes, current smoker. Other Factors: COPD.  Limitations: Air/bowel gas.   Performing Technologist: Sharlett Iles RVT  Examination Guidelines: A complete evaluation includes B-mode imaging, spectral Doppler, color Doppler, and power Doppler as needed of all accessible portions of each vessel. Bilateral testing is considered an integral part of a complete examination. Limited examinations for reoccurring indications may be performed as noted.  Abdominal Aorta Findings: +-------------+-------+----------+----------+----------+--------+--------+ Location     AP (cm)Trans (cm)PSV (cm/s)Waveform  ThrombusComments +-------------+-------+----------+----------+----------+--------+--------+ Proximal     1.90   1.90      86                                   +-------------+-------+----------+----------+----------+--------+--------+ Mid          2.50   2.50      61                                   +-------------+-------+----------+----------+----------+--------+--------+  Distal       1.70   1.70      73                          ectatic  +-------------+-------+----------+----------+----------+--------+--------+ RT CIA Prox                   166       monophasic                 +-------------+-------+----------+----------+----------+--------+--------+ RT CIA Mid                    328       monophasic                 +-------------+-------+----------+----------+----------+--------+--------+ RT CIA Distal                 380       monophasic                 +-------------+-------+----------+----------+----------+--------+--------+ RT EIA Prox                   128       monophasic                  +-------------+-------+----------+----------+----------+--------+--------+ RT EIA Mid                    108       monophasic                 +-------------+-------+----------+----------+----------+--------+--------+ RT EIA Distal                 93        monophasic                 +-------------+-------+----------+----------+----------+--------+--------+ LT CIA Prox                   100       biphasic                   +-------------+-------+----------+----------+----------+--------+--------+ LT CIA Mid                    314       biphasic                   +-------------+-------+----------+----------+----------+--------+--------+ LT CIA Distal                 212       biphasic                   +-------------+-------+----------+----------+----------+--------+--------+ LT EIA Prox                   217       biphasic                   +-------------+-------+----------+----------+----------+--------+--------+ LT EIA Mid                    212       biphasic                   +-------------+-------+----------+----------+----------+--------+--------+ LT EIA Distal                 172       biphasic                   +-------------+-------+----------+----------+----------+--------+--------+  Aortoiliac atherosclerosis with elevated velocities in the bilateral common iliac arteries and left external iliac artery. Abnormal tapering of the abdominal aorta; mid aorta is ectatic, measuring 2.5 cm.  IVC/Iliac Findings: +--------+------+--------+--------+   IVC   PatentThrombusComments +--------+------+--------+--------+ IVC Proxpatent                 +--------+------+--------+--------+  Technically challenging study due to patient movement. Right leg spasms and back pain. Summary: Abdominal Aorta: The mid abdominal aorta is ectatic, measuring 2.5 cm. Stenosis: +-------------------+----------------------------+ Location           Stenosis                      +-------------------+----------------------------+ Right Common Iliac >50% stenosis                +-------------------+----------------------------+ Left Common Iliac  >50% stenosis                +-------------------+----------------------------+ Left External Iliac>50% stenosis, low end range +-------------------+----------------------------+  Patent IVC. *See table(s) above for measurements and observations. Vascular consult recommended.  Electronically signed by Quay Burow MD on 01/20/2018 at 8:00:51 AM.    Final      LOS: 4 days   Oren Binet, MD  [facility]  If 7PM-7AM, please contact night-coverage  Please page via [email or web address]-Password [email or web address]-click on MD name and type text message  01/30/2018, 2:04 PM

## 2018-01-30 NOTE — Progress Notes (Addendum)
Physical Therapy Treatment Patient Details Name: [name] MRN: [id number] DOB: Nov 13, 1945 Today's Date: 01/30/2018    History of Present Illness  73 y.o. female with medical history significant of COPD with chronic hypoxic failure on 4 L oxygen at home, smoker, severe peripheral vascular disease, hypertension, diabetes on oral hypoglycemics and recently developed ischemic ulcer on her foot who presents to the emergency room after having a routine evaluation at cardiology office and found to have hemoglobin of 6.8.    PT Comments    Pt remains to require external assistance for all mobility.  She presents with LOB posterior and required min assistance to correct.  Pt reports she has assistance at home from her brother to assist her in her mobility.  Spoke with RN caring for patient who received communication from patient's brother who reports he will not be able to care for her around the clock.  Based on the information provided will update recommendations to SNF at this time.  Will inform supervising PT of change in recommendations and need for placement due to her lack of assistance when she returns home.     Follow Up Recommendations  SNF;Supervision/Assistance - 24 hour(if patient refuses placement she will require HHPT and 24 hour assistance.  )     Equipment Recommendations  None recommended by PT    Recommendations for Other Services       Precautions / Restrictions Precautions Precautions: Other (comment) Precaution Comments: monitor O2 Restrictions Weight Bearing Restrictions: No    Mobility  Bed Mobility Overal bed mobility: Modified Independent Bed Mobility: Supine to Sit     Supine to sit: Modified independent (Device/Increase time)     General bed mobility comments: with bedrail, HOB up  Transfers Overall transfer level: Needs assistance Equipment used: Rolling walker (2 wheeled) Transfers: Sit to/from Stand Sit to Stand: Min assist Stand pivot  transfers: Min guard       General transfer comment: Pt continues to present with LOB posterior when attempting to come to standing.  Pt required min assistance to maintain and come to standing.  Once in standing patient is heavily reliant on UE support.    Ambulation/Gait Ambulation/Gait assistance: Min guard Gait Distance (Feet): 100 Feet Assistive device: Rolling walker (2 wheeled) Gait Pattern/deviations: Step-through pattern;Trunk flexed;Shuffle;Decreased stride length     General Gait Details: Pt continues to present with hypoxia.  Required 4L [place] and O2 sats decreased to 88% with activity.  Required rest break to improve O2 saturations.     Stairs             Wheelchair Mobility    Modified Rankin (Stroke Patients Only)       Balance Overall balance assessment: Needs assistance Sitting-balance support: Single extremity supported         Standing balance-Leahy Scale: Poor Standing balance comment: dependent on external support, LOB posterior                            Cognition Arousal/Alertness: Awake/alert Behavior During Therapy: WFL for tasks assessed/performed Overall Cognitive Status: Within Functional Limits for tasks assessed                                        Exercises      General Comments        Pertinent Vitals/Pain Pain Assessment: No/denies pain  Home Living                      Prior Function            PT Goals (current goals can now be found in the care plan section) Acute Rehab PT Goals Patient Stated Goal: to get better Potential to Achieve Goals: Fair Progress towards PT goals: Progressing toward goals    Frequency    Min 3X/week      PT Plan Discharge plan needs to be updated    Co-evaluation              AM-PAC PT "6 Clicks" Mobility   Outcome Measure  Help needed turning from your back to your side while in a flat bed without using bedrails?: A Little Help  needed moving from lying on your back to sitting on the side of a flat bed without using bedrails?: A Little Help needed moving to and from a bed to a chair (including a wheelchair)?: A Little Help needed standing up from a chair using your arms (e.g., wheelchair or bedside chair)?: A Little Help needed to walk in hospital room?: A Little Help needed climbing 3-5 steps with a railing? : A Little 6 Click Score: 18    End of Session Equipment Utilized During Treatment: Gait belt;Oxygen Activity Tolerance: Patient limited by fatigue Patient left: in chair;with call bell/phone within reach Nurse Communication: Mobility status PT Visit Diagnosis: Difficulty in walking, not elsewhere classified (R26.2)     Time: 0240-9735 PT Time Calculation (min) (ACUTE ONLY): 18 min  Charges:  $Gait Training: 8-22 mins                     [name], PTA Acute Rehabilitation Services Pager [phone] Office [phone]     [name] [name] 01/30/2018, 4:16 PM

## 2018-01-30 NOTE — Progress Notes (Signed)
CSW spoke with patient and her brother, [name]. He is unable to assist patient at home and is requesting SNF. Patient's brother and patient are agreeable to [facility]. They will begin insurance authorization process.   Percell Locus Keva Darty LCSW [phone]

## 2018-01-30 NOTE — NC FL2 (Signed)
[place] LEVEL OF CARE SCREENING TOOL     IDENTIFICATION  Patient Name: [name] Birthdate: 1945-10-22 Sex: female Admission Date (Current Location): 01/25/2018  [facility] and [place] Number:  Herbalist and Address:  [facility], [place] [address] [place], [place] [address]      Provider Number: [id number]  Attending Physician Name and Address:  Jonetta Osgood, MD  Relative Name and Phone Number:  Gordan Payment, brother, [phone]    Current Level of Care: Hospital Recommended Level of Care: [place] Prior Approval Number:    Date Approved/Denied:   PASRR Number: [id number]  Discharge Plan: SNF    Current Diagnoses: Patient Active Problem List   Diagnosis Date Noted  . Anemia 01/26/2018  . PVD (peripheral vascular disease) ([place]) 01/26/2018  . Ischemic ulcer diabetic foot ([place]) 01/26/2018  . Malnutrition of moderate degree 01/26/2018  . Critical limb ischemia with history of revascularization of same extremity 12/21/2017  . Hypomagnesemia 08/25/2017  . Bilateral back pain 08/24/2017  . Compression fracture of thoracic vertebra ([facility]) 08/24/2017  . Hyponatremia 08/24/2017  . Hypokalemia 08/24/2017  . Fall 08/24/2017  . Microcytic anemia 08/24/2017  . Alcohol use 08/24/2017  . Depression 08/24/2017  . Dyslipidemia associated with type 2 diabetes mellitus ([place]) 04/28/2016  . Diabetes mellitus, type II, insulin dependent ([place]) 04/28/2016  . GERD without esophagitis 04/28/2016  . O2 dependent 04/28/2016  . Community acquired pneumonia   . Sepsis due to pneumonia ([place]) 04/18/2016  . COPD exacerbation ([place]) 12/04/2010  . Smoker 12/04/2010  . Essential hypertension 01/31/2008  . Chronic respiratory failure ([place]) 01/31/2008    Orientation RESPIRATION BLADDER Height & Weight     Self, Time, Situation, Place  O2(Nasal cannula 4L) Incontinent, External catheter Weight: 73.1 kg Height:      BEHAVIORAL SYMPTOMS/MOOD NEUROLOGICAL BOWEL NUTRITION STATUS      Continent Diet(Please see DC Summary)  AMBULATORY STATUS COMMUNICATION OF NEEDS Skin   Extensive Assist Verbally Other (Comment)(Diabetic ulcer on toe)                       Personal Care Assistance Level of Assistance  Bathing, Feeding, Dressing Bathing Assistance: Limited assistance Feeding assistance: Independent Dressing Assistance: Limited assistance     Functional Limitations Info  Sight, Hearing, Speech Sight Info: Adequate Hearing Info: Adequate Speech Info: Adequate    SPECIAL CARE FACTORS FREQUENCY  PT (By licensed PT), OT (By licensed OT)     PT Frequency: 5x/week OT Frequency: 3x/week            Contractures Contractures Info: Not present    Additional Factors Info  Code Status, Allergies, Insulin Sliding Scale, Psychotropic Code Status Info: Full Allergies Info: NKA Psychotropic Info: Paxil Insulin Sliding Scale Info: 3x daily with meals and at bedtime       Current Medications (01/30/2018):  This is the current hospital active medication list Current Facility-Administered Medications  Medication Dose Route Frequency Provider Last Rate Last Dose  . 0.9 %  sodium chloride infusion  250 mL Intravenous PRN Lorretta Harp, MD      . 0.9% sodium chloride infusion  1 mL/kg/hr Intravenous Continuous Lorretta Harp, MD      . acetaminophen (TYLENOL) tablet 650 mg  650 mg Oral Q6H PRN Barb Merino, MD       Or  . acetaminophen (TYLENOL) suppository 650 mg  650 mg Rectal Q6H PRN Barb Merino, MD      .  albuterol (PROVENTIL) (2.5 MG/3ML) 0.083% nebulizer solution 2.5 mg  2.5 mg Nebulization Q2H PRN Barb Merino, MD   2.5 mg at 01/28/18 0741  . amLODipine (NORVASC) tablet 5 mg  5 mg Oral Daily Barb Merino, MD   5 mg at 01/30/18 0851   And  . irbesartan (AVAPRO) tablet 150 mg  150 mg Oral Daily Barb Merino, MD   150 mg at 01/30/18 0851  . aspirin chewable tablet 81 mg  81 mg  Oral Pre-Cath Lorretta Harp, MD      . aspirin EC tablet 81 mg  81 mg Oral Daily Barb Merino, MD   81 mg at 01/30/18 0851  . atorvastatin (LIPITOR) tablet 20 mg  20 mg Oral QPM Barb Merino, MD   20 mg at 01/29/18 1705  . budesonide (PULMICORT) nebulizer solution 0.5 mg  0.5 mg Nebulization BID Amin, Ankit Chirag, MD   0.5 mg at 01/29/18 2016  . diazepam (VALIUM) tablet 5 mg  5 mg Oral Q12H PRN Barb Merino, MD   5 mg at 01/29/18 2253  . doxycycline (VIBRA-TABS) tablet 100 mg  100 mg Oral Q12H Barb Merino, MD   100 mg at 01/30/18 0851  . feeding supplement (GLUCERNA SHAKE) (GLUCERNA SHAKE) liquid 237 mL  237 mL Oral BID BM Barb Merino, MD   237 mL at 01/30/18 0851  . feeding supplement (PRO-STAT SUGAR FREE 64) liquid 30 mL  30 mL Oral BID Barb Merino, MD   30 mL at 01/30/18 0851  . ferrous sulfate tablet 325 mg  325 mg Oral TID WC Amin, Ankit Chirag, MD   325 mg at 01/30/18 0851  . fluticasone furoate-vilanterol (BREO ELLIPTA) 200-25 MCG/INH 1 puff  1 puff Inhalation Daily Barb Merino, MD   1 puff at 01/29/18 0911  . guaiFENesin-dextromethorphan (ROBITUSSIN DM) 100-10 MG/5ML syrup 5 mL  5 mL Oral Q4H PRN Blount, Scarlette Shorts T, NP   5 mL at 01/27/18 2312  . heparin injection 5,000 Units  5,000 Units Subcutaneous Q8H Barb Merino, MD   5,000 Units at 01/30/18 0518  . insulin aspart (novoLOG) injection 0-5 Units  0-5 Units Subcutaneous QHS Ghimire, Dante Gang, MD      . insulin aspart (novoLOG) injection 0-9 Units  0-9 Units Subcutaneous TID WC Barb Merino, MD   2 Units at 01/29/18 1705  . ipratropium-albuterol (DUONEB) 0.5-2.5 (3) MG/3ML nebulizer solution 3 mL  3 mL Nebulization Q4H PRN Amin, Ankit Chirag, MD      . ipratropium-albuterol (DUONEB) 0.5-2.5 (3) MG/3ML nebulizer solution 3 mL  3 mL Nebulization TID Jonetta Osgood, MD      . MEDLINE mouth rinse  15 mL Mouth Rinse BID Amin, Ankit Chirag, MD   15 mL at 01/29/18 2255  . methocarbamol (ROBAXIN) tablet 500 mg  500 mg Oral  Q8H PRN Barb Merino, MD      . multivitamin with minerals tablet 1 tablet  1 tablet Oral Daily Barb Merino, MD   1 tablet at 01/30/18 0851  . nicotine (NICODERM CQ - dosed in mg/24 hours) patch 14 mg  14 mg Transdermal Daily Barb Merino, MD   14 mg at 01/27/18 1055  . ondansetron (ZOFRAN) tablet 4 mg  4 mg Oral Q6H PRN Barb Merino, MD       Or  . ondansetron (ZOFRAN) injection 4 mg  4 mg Intravenous Q6H PRN Barb Merino, MD      . pantoprazole (PROTONIX) EC tablet 40 mg  40 mg Oral Daily Ghimire,  Dante Gang, MD   40 mg at 01/30/18 0851  . PARoxetine (PAXIL) tablet 30 mg  30 mg Oral Daily Barb Merino, MD   30 mg at 01/30/18 0851  . polyethylene glycol (MIRALAX / GLYCOLAX) packet 17 g  17 g Oral Daily PRN Amin, Ankit Chirag, MD      . senna-docusate (Senokot-S) tablet 2 tablet  2 tablet Oral QHS PRN Amin, Ankit Chirag, MD      . silver sulfADIAZINE (SILVADENE) 1 % cream   Topical BID Amin, Ankit Chirag, MD      . sodium chloride flush (NS) 0.9 % injection 3 mL  3 mL Intravenous Q12H Lorretta Harp, MD   3 mL at 01/30/18 0856  . sodium chloride flush (NS) 0.9 % injection 3 mL  3 mL Intravenous PRN Lorretta Harp, MD         Discharge Medications: Please see discharge summary for a list of discharge medications.  Relevant Imaging Results:  Relevant Lab Results:   Additional Information SS#: [id number]  Benard Halsted, LCSW

## 2018-01-30 NOTE — Clinical Social Work Note (Signed)
Clinical Social Work Assessment  Patient Details  Name: [name] MRN: [id number] Date of Birth: [birth year]  Date of referral:  01/30/18               Reason for consult:  Facility Placement                Permission sought to share information with:  Facility Sport and exercise psychologist, Family Supports Permission granted to share information::  Yes, Verbal Permission Granted  Name::     [name]::  SNFs  Relationship::  Brother  Contact Information:  [phone]  Housing/Transportation Living arrangements for the past 2 months:  Single Family Home Source of Information:  Patient Patient Interpreter Needed:  None Criminal Activity/Legal Involvement Pertinent to Current Situation/Hospitalization:  No - Comment as needed Significant Relationships:  Siblings Lives with:  Self Do you feel safe going back to the place where you live?  No Need for family participation in patient care:  No (Coment)  Care giving concerns:  CSW received consult for possible SNF placement at time of discharge. CSW spoke with patient regarding request for SNF placement at time of discharge. Patient reported that patient's brother is currently able to care for patient given patient's current physical needs and fall risk. Patient expressed understanding of PT recommendation and is currently not agreeable to placement at time of discharge. PT to work with patient again today. CSW to continue to follow and assist with discharge planning needs.   Social Worker assessment / plan:  CSW spoke with patient concerning possibility of rehab at [facility] before returning home..   Employment status:  Retired Nurse, adult PT Recommendations:  Home with [place] / Referral to community resources:  [place]  Patient/Family's Response to care:  Patient reports that she would prefer to return home. CSW discussed her mobility status and she reported that her brother  assists her as needed.   Patient/Family's Understanding of and Emotional Response to Diagnosis, Current Treatment, and Prognosis:  Patient/family is realistic regarding therapy needs and expressed being hopeful for return home. Patient expressed understanding of CSW role and discharge process as well as medical condition. No questions/concerns about plan or treatment.    Emotional Assessment Appearance:  Appears stated age Attitude/Demeanor/Rapport:  Engaged Affect (typically observed):  Accepting, Appropriate Orientation:  Oriented to Self, Oriented to Place, Oriented to  Time, Oriented to Situation Alcohol / Substance use:  Not Applicable Psych involvement (Current and /or in the community):  No (Comment)  Discharge Needs  Concerns to be addressed:  Care Coordination Readmission within the last 30 days:  No Current discharge risk:  None Barriers to Discharge:  [place], LCSW 01/30/2018, 10:00 AM

## 2018-01-31 LAB — GLUCOSE, CAPILLARY
Glucose-Capillary: 89 mg/dL (ref 70–99)
Glucose-Capillary: 126 mg/dL — ABNORMAL HIGH (ref 70–99)
Glucose-Capillary: 138 mg/dL — ABNORMAL HIGH (ref 70–99)
Glucose-Capillary: 128 mg/dL — ABNORMAL HIGH (ref 70–99)

## 2018-01-31 MED ORDER — HYDRALAZINE HCL 20 MG/ML IJ SOLN: 10.0000 mg | INTRAMUSCULAR | Status: DC | PRN

## 2018-01-31 NOTE — Progress Notes (Signed)
PROGRESS NOTE        PATIENT DETAILS Name: [name] Age: 73 y.o. Sex: female Date of Birth: 1945/07/09 Admit Date: 01/25/2018 Admitting Physician Barb Merino, MD QJJ:HERDEYC, Dibas, MD  Brief Narrative: Patient is a 73 y.o. female with history of COPD with chronic hypoxic respiratory failure on 4 L of oxygen at home, DM-2, hypertension, PAD-who was followed by cardiology for a tentative angiography of her lower extremities-incidentally found to have a hemoglobin of 6.8 with COPD exacerbation, she was referred to the hospitalist service for further inpatient evaluation and treatment.  See below for further details 8  Subjective: Continues to slowly improve-shortness of breath has improved-she is not yet back to baseline.  Assessment/Plan: Microcytic anemia: Iron panel consistent with iron deficiency-however patient denies any overt blood loss.  FOBT negative.  Hemoglobin stable-did require 2 units of PRBC on admission and now on iron supplementation.  Given overall tenuous respiratory status-best to pursue outpatient endoscopic evaluation once she has recovered completely from COPD exacerbation.  Discussed at length with patient-she is agreeable with this approach.    COPD exacerbation: Overall improved-moving air well-continues to have few scattered rhonchi-continue to taper steroids-encourage incentive spirometry and flutter valve.    Chronic hypoxic respiratory failure: Continue 4 L of oxygen-this is her home regimen.  PAD with ischemic ulcer to right great toe: Continue aspirin/statin-wound care per wound care RN.  Patient will need to follow-up with cardiology in the outpatient setting for angiography  Tobacco abuse: Controlled  DM-2: CBG stable-continue SSI.  Hypertension: Controlled-continue with amlodipine and Avapro  Dyslipidemia: Continue statin  Debility/deconditioning: Secondary to acute illness-still very debilitated and not yet at  baseline-PT evaluation recommending SNF-social worker following.  Awaiting insurance authorization.    Moderate malnutrition: Continue supplements.  DVT Prophylaxis: Prophylactic Heparin  Code Status: Full code  Family Communication: None at bedside  Disposition Plan: Remain inpatient- SNF on discharge  Antimicrobial agents: Anti-infectives (From admission, onward)   Start     Dose/Rate Route Frequency Ordered Stop   01/26/18 1300  doxycycline (VIBRA-TABS) tablet 100 mg     100 mg Oral Every 12 hours 01/26/18 1208 01/30/18 2243      Procedures: None  CONSULTS:  None  Time spent: 25- minutes-Greater than 50% of this time was spent in counseling, explanation of diagnosis, planning of further management, and coordination of care.  MEDICATIONS: Scheduled Meds: . amLODipine  5 mg Oral Daily   And  . irbesartan  150 mg Oral Daily  . aspirin EC  81 mg Oral Daily  . atorvastatin  20 mg Oral QPM  . budesonide (PULMICORT) nebulizer solution  0.5 mg Nebulization BID  . feeding supplement (GLUCERNA SHAKE)  237 mL Oral BID BM  . feeding supplement (PRO-STAT SUGAR FREE 64)  30 mL Oral BID  . ferrous sulfate  325 mg Oral TID WC  . fluticasone furoate-vilanterol  1 puff Inhalation Daily  . heparin  5,000 Units Subcutaneous Q8H  . insulin aspart  0-5 Units Subcutaneous QHS  . insulin aspart  0-9 Units Subcutaneous TID WC  . ipratropium-albuterol  3 mL Nebulization TID  . mouth rinse  15 mL Mouth Rinse BID  . multivitamin with minerals  1 tablet Oral Daily  . nicotine  14 mg Transdermal Daily  . pantoprazole  40 mg Oral Daily  . PARoxetine  30 mg  Oral Daily  . silver sulfADIAZINE   Topical BID  . sodium chloride flush  3 mL Intravenous Q12H   Continuous Infusions: . sodium chloride    . sodium chloride     PRN Meds:.sodium chloride, acetaminophen **OR** acetaminophen, albuterol, diazepam, guaiFENesin-dextromethorphan, hydrALAZINE, ipratropium-albuterol, methocarbamol,  ondansetron **OR** ondansetron (ZOFRAN) IV, polyethylene glycol, senna-docusate, sodium chloride flush   PHYSICAL EXAM: Vital signs: Vitals:   01/30/18 2029 01/30/18 2135 01/31/18 0518 01/31/18 0936  BP:  (!) 157/67 (!) 167/66   Pulse: 95 81 78   Resp: (!) 22 18 18    Temp:  98 F (36.7 C) 98.1 F (36.7 C)   TempSrc:  Oral Oral   SpO2: 98% 96% 100% 94%  Weight:       Filed Weights   01/26/18 1612  Weight: 73.1 kg   Body mass index is 24.51 kg/m.   General appearance:Awake, alert, not in any distress.  Eyes:no scleral icterus. HEENT: Atraumatic and Normocephalic Neck: supple, no JVD. Resp:Good air entry bilaterally, + rhonchi CVS: S1 S2 regular GI: Bowel sounds present, Non tender and not distended with no gaurding, rigidity or rebound. Extremities: B/L Lower Ext shows no edema, both legs are warm to touch Neurology:  Non focal Psychiatric: Normal judgment and insight. Normal mood. Musculoskeletal:No digital cyanosis Skin:No Rash, warm and dry   I have personally reviewed following labs and imaging studies  LABORATORY DATA: CBC: Recent Labs  Lab 01/24/18 1526 01/25/18 2127 01/27/18 0505 01/28/18 0346 01/29/18 0433  WBC 3.7 5.4 3.0* 6.3 6.3  HGB 6.8* 7.2* 9.8* 9.6* 9.7*  HCT 21.9* 24.3* 31.4* 31.3* 32.8*  MCV 69* 73.9* 76.4* 77.9* 78.7*  PLT 184 178 166 171 174    Basic Metabolic Panel: Recent Labs  Lab 01/24/18 1526 01/25/18 2127 01/27/18 0505 01/28/18 0346 01/29/18 0433  NA 130* 128* 133* 135 136  K 3.4* 3.3* 4.0 3.7 5.0  CL 87* 88* 93* 92* 91*  CO2 25 29 31  35* 37*  GLUCOSE 80 107* 157* 122* 96  BUN 10 14 13 13 14   CREATININE 0.75 0.77 0.69 0.63 0.72  CALCIUM 8.3* 8.4* 8.9 8.9 9.1  MG  --   --   --  1.5* 1.6*    GFR: Estimated Creatinine Clearance: 65.1 mL/min (by C-G formula based on SCr of 0.72 mg/dL).  Liver Function Tests: Recent Labs  Lab 01/25/18 2127 01/29/18 0433  AST 26 20  ALT 17 17  ALKPHOS 51 50  BILITOT 0.4 0.8  PROT  6.8 6.2*  ALBUMIN 3.2* 2.9*   No results for input(s): LIPASE, AMYLASE in the last 168 hours. No results for input(s): AMMONIA in the last 168 hours.  Coagulation Profile: No results for input(s): INR, PROTIME in the last 168 hours.  Cardiac Enzymes: No results for input(s): CKTOTAL, CKMB, CKMBINDEX, TROPONINI in the last 168 hours.  BNP (last 3 results) No results for input(s): PROBNP in the last 8760 hours.  HbA1C: No results for input(s): HGBA1C in the last 72 hours.  CBG: Recent Labs  Lab 01/30/18 1146 01/30/18 1638 01/30/18 2251 01/31/18 0805 01/31/18 1152  GLUCAP 275* 151* 107* 89 126*    Lipid Profile: No results for input(s): CHOL, HDL, LDLCALC, TRIG, CHOLHDL, LDLDIRECT in the last 72 hours.  Thyroid Function Tests: No results for input(s): TSH, T4TOTAL, FREET4, T3FREE, THYROIDAB in the last 72 hours.  Anemia Panel: No results for input(s): VITAMINB12, FOLATE, FERRITIN, TIBC, IRON, RETICCTPCT in the last 72 hours.  Urine analysis:  Component Value Date/Time   COLORURINE YELLOW 08/24/2017 2000   APPEARANCEUR CLEAR 08/24/2017 2000   LABSPEC 1.018 08/24/2017 2000   PHURINE 5.0 08/24/2017 2000   GLUCOSEU NEGATIVE 08/24/2017 2000   HGBUR NEGATIVE 08/24/2017 2000   BILIRUBINUR NEGATIVE 08/24/2017 2000   KETONESUR 5 (A) 08/24/2017 2000   PROTEINUR NEGATIVE 08/24/2017 2000   NITRITE NEGATIVE 08/24/2017 2000   LEUKOCYTESUR NEGATIVE 08/24/2017 2000    Sepsis Labs: Lactic Acid, Venous    Component Value Date/Time   LATICACIDVEN 1.4 04/19/2016 0221    MICROBIOLOGY: No results found for this or any previous visit (from the past 240 hour(s)).  RADIOLOGY STUDIES/RESULTS: Dg Chest 2 View  Result Date: 01/26/2018 CLINICAL DATA:  Shortness of breath.  History of COPD and pneumonia. EXAM: CHEST - 2 VIEW COMPARISON:  Chest radiograph April 23, 2016 and CT chest October 08, 2016 FINDINGS: Cardiac silhouette is upper limits of normal size. Calcified aortic arch.  Chronic interstitial changes increased lung volumes without pleural effusion or focal consolidation. No pneumothorax. Osteopenia. New age indeterminate T12 severe compression fracture. IMPRESSION: 1. Borderline cardiomegaly and COPD. 2. Osteopenia and age indeterminate severe T12 compression fracture. 3.  Aortic Atherosclerosis (ICD10-I70.0). Electronically Signed   By: Elon Alas M.D.   On: 01/26/2018 01:11   Dg Chest Port 1 View  Result Date: 01/26/2018 CLINICAL DATA:  73 year old female with shortness of breath. EXAM: PORTABLE CHEST 1 VIEW COMPARISON:  01/26/2018 radiographs, chest CT 10/08/2016, and earlier. FINDINGS: Portable AP semi upright view at 0609 hours. Mildly tortuous thoracic aorta with calcified atherosclerosis. Other mediastinal contours are within normal limits. Large lung volumes. Coarse bilateral pulmonary interstitial opacity appears stable since 2018. No pneumothorax, pulmonary edema, or acute pulmonary opacity. Visualized tracheal air column is within normal limits. Paucity of bowel gas in the upper abdomen. Stable visualized osseous structures. IMPRESSION: Stable chronic lung disease. No acute cardiopulmonary abnormality identified. Electronically Signed   By: Genevie Ann M.D.   On: 01/26/2018 06:38   Dg Foot Complete Right  Result Date: 01/17/2018 Please see detailed radiograph report in office note.  Vas [place] Burnard Bunting With/wo Tbi  Result Date: 01/13/2018 LOWER EXTREMITY DOPPLER STUDY Indications: In 11/2017, an arterial Doppler showed an ABI of .60 on the right              and .89 on the left. Patient denies any leg pain. She does have two              open wounds on the bottom and medial right great toe. High Risk Factors: Hypertension, Diabetes, current smoker.  Other Factors: COPD.  Performing Technologist: Sharlett Iles RVT  Examination Guidelines: A complete evaluation includes at minimum, Doppler waveform signals and systolic blood pressure reading at the level of bilateral  brachial, anterior tibial, and posterior tibial arteries, when vessel segments are accessible. Bilateral testing is considered an integral part of a complete examination. Photoelectric Plethysmograph (PPG) waveforms and toe systolic pressure readings are included as required and additional duplex testing as needed. Limited examinations for reoccurring indications may be performed as noted.  ABI Findings: +---------+------------------+-----+----------+--------+ Right    Rt Pressure (mmHg)IndexWaveform  Comment  +---------+------------------+-----+----------+--------+ Brachial 153                                       +---------+------------------+-----+----------+--------+ ATA      89  0.58 monophasic         +---------+------------------+-----+----------+--------+ PTA      89                0.58 monophasic         +---------+------------------+-----+----------+--------+ PERO     90                0.59 monophasic         +---------+------------------+-----+----------+--------+ Great Toe70                0.46 Abnormal           +---------+------------------+-----+----------+--------+ +---------+------------------+-----+-----------+-------+ Left     Lt Pressure (mmHg)IndexWaveform   Comment +---------+------------------+-----+-----------+-------+ Brachial 150                                       +---------+------------------+-----+-----------+-------+ ATA      125               0.82 multiphasic        +---------+------------------+-----+-----------+-------+ PTA      128               0.84 multiphasic        +---------+------------------+-----+-----------+-------+ PERO     125               0.82 biphasic           +---------+------------------+-----+-----------+-------+ Great Toe76                0.50 Abnormal           +---------+------------------+-----+-----------+-------+ +-------+-----------+-----------+------------+------------+  ABI/TBIToday's ABIToday's TBIPrevious ABIPrevious TBI +-------+-----------+-----------+------------+------------+ Right  .59        .46        .60         .55          +-------+-----------+-----------+------------+------------+ Left   .84        .50        .89         .0           +-------+-----------+-----------+------------+------------+ Right ABIs appear essentially unchanged compared to prior study on 11/04/2017. Left ABIs appear essentially unchanged compared to prior study on 11/04/2017.  Summary: Right: Resting right ankle-brachial index indicates moderate right lower extremity arterial disease. The right toe-brachial index is abnormal. Left: Resting left ankle-brachial index indicates mild left lower extremity arterial disease. The left toe-brachial index is abnormal.  *See table(s) above for measurements and observations. See Arterial duplex.  Vascular consult recommended. Electronically signed by Ena Dawley MD on 01/13/2018 at 1:15:32 PM.    Final (Updating)    Vas [place] Lower Extremity Arterial Duplex  Result Date: 01/17/2018 LOWER EXTREMITY ARTERIAL DUPLEX STUDY Indications: In 11/2017, an arterial Doppler showed an ABI of .60 on the right              and .89 on the left. Patient denies any leg pain. She does have two              open wounds on the bottom and medial right great toe. High Risk Factors: Hypertension, Diabetes, current smoker.  Other Factors: COPD.  Current ABI: .59 on the right and .84 on the left. Performing Technologist: Sharlett Iles RVT  Examination Guidelines: A complete evaluation includes B-mode imaging, spectral Doppler, color Doppler, and power Doppler as needed of all accessible portions of each vessel. Bilateral testing is  considered an integral part of a complete examination. Limited examinations for reoccurring indications may be performed as noted.  Right Duplex Findings: +-----------+--------+-----+---------------+----------+--------+             PSV cm/sRatioStenosis       Waveform  Comments +-----------+--------+-----+---------------+----------+--------+ CFA Prox   95                          monophasic         +-----------+--------+-----+---------------+----------+--------+ DFA        75                          monophasic         +-----------+--------+-----+---------------+----------+--------+ SFA Prox   259          50-74% stenosismonophasic         +-----------+--------+-----+---------------+----------+--------+ SFA Mid    83                          monophasic         +-----------+--------+-----+---------------+----------+--------+ SFA Distal 98                          monophasic         +-----------+--------+-----+---------------+----------+--------+ POP Prox   71                          monophasic         +-----------+--------+-----+---------------+----------+--------+ POP Mid    62                          monophasic         +-----------+--------+-----+---------------+----------+--------+ POP Distal 58                          monophasic         +-----------+--------+-----+---------------+----------+--------+ ATA Prox   69                          monophasic         +-----------+--------+-----+---------------+----------+--------+ ATA Mid    91                          monophasic         +-----------+--------+-----+---------------+----------+--------+ ATA Distal 64                          monophasic         +-----------+--------+-----+---------------+----------+--------+ PTA Prox   65                          monophasic         +-----------+--------+-----+---------------+----------+--------+ PTA Mid    56                          monophasic         +-----------+--------+-----+---------------+----------+--------+ PTA Distal 44                          monophasic         +-----------+--------+-----+---------------+----------+--------+ PERO Prox   39  monophasic         +-----------+--------+-----+---------------+----------+--------+ PERO Mid   62                          monophasic         +-----------+--------+-----+---------------+----------+--------+ PERO Distal18                          monophasic         +-----------+--------+-----+---------------+----------+--------+ Mixed plaque throughout. A focal velocity elevation of 259 cm/s was obtained at proximal SFA distal to the ostium with post stenotic turbulence with a VR of 3.3. Findings are characteristic of 50-74% stenosis.  Left Duplex Findings: +----------+--------+-----+---------------+--------+---------+           PSV cm/sRatioStenosis       WaveformComments  +----------+--------+-----+---------------+--------+---------+ CFA Prox  179                         biphasicturbulent +----------+--------+-----+---------------+--------+---------+ DFA       75                          biphasic          +----------+--------+-----+---------------+--------+---------+ SFA Prox  240          50-74% stenosisbiphasic          +----------+--------+-----+---------------+--------+---------+ SFA Mid   80                          biphasic          +----------+--------+-----+---------------+--------+---------+ SFA Distal69                          biphasic          +----------+--------+-----+---------------+--------+---------+ POP Prox  58                          biphasic          +----------+--------+-----+---------------+--------+---------+ POP Mid   48                          biphasic          +----------+--------+-----+---------------+--------+---------+ POP Distal44                          biphasic          +----------+--------+-----+---------------+--------+---------+ ATA Prox  55                          biphasic          +----------+--------+-----+---------------+--------+---------+ ATA Mid   63                           biphasic          +----------+--------+-----+---------------+--------+---------+ ATA Distal45                          biphasic          +----------+--------+-----+---------------+--------+---------+ PTA Prox  37                          biphasic          +----------+--------+-----+---------------+--------+---------+  PTA Mid   67                          biphasic          +----------+--------+-----+---------------+--------+---------+ PTA Distal37                          biphasic          +----------+--------+-----+---------------+--------+---------+ PERO Mid  NV                                            +----------+--------+-----+---------------+--------+---------+ Mixed plaque throughout. A focal velocity elevation of 240 cm/s was obtained at proximal SFA distal to the ostium with post stenotic turbulence with a VR of 2.0. Findings are characteristic of 50-74% stenosis.  Technically challenging study due to patient movement. Right leg spasms, left leg cramping and back pain.  Summary: Right: 50-74% stenosis noted in the proximal SFA distal to the ostium. Three vessel run-off. Left: 50-74% stenosis noted in the proximal SFA distal to the ostium. At least a two vessel run-off. Peroneal artery not visualized.  See table(s) above for measurements and observations. See ABI results.  Suggest follow up Aortoiliac Duplex based on abnormal bilateral common femoral artery waveforms.  Vascular consult recommended. Electronically signed by Ena Dawley MD on 01/17/2018 at 1:00:30 PM.    Final    Vas US Aorta/ivc/iliacs  Result Date: 01/20/2018 ABDOMINAL AORTA STUDY Indications: On 01/13/2018, a lower arterial duplex showed 50-74% stenosis in the              bilateral proximal SFA, distal to the ostium. Monophasic flow is              noted throughout the right leg and biphasic flow throughout the              left. Patient denies any leg pain. She does  have two open wounds on              the bottom and medial right great toe.               ABIs performed on 01/13/2018 showed .59 on the right and .84 on the              left. Risk Factors: Hypertension, Diabetes, current smoker. Other Factors: COPD.  Limitations: Air/bowel gas.   Performing Technologist: Sharlett Iles RVT  Examination Guidelines: A complete evaluation includes B-mode imaging, spectral Doppler, color Doppler, and power Doppler as needed of all accessible portions of each vessel. Bilateral testing is considered an integral part of a complete examination. Limited examinations for reoccurring indications may be performed as noted.  Abdominal Aorta Findings: +-------------+-------+----------+----------+----------+--------+--------+ Location     AP (cm)Trans (cm)PSV (cm/s)Waveform  ThrombusComments +-------------+-------+----------+----------+----------+--------+--------+ Proximal     1.90   1.90      86                                   +-------------+-------+----------+----------+----------+--------+--------+ Mid          2.50   2.50      61                                   +-------------+-------+----------+----------+----------+--------+--------+  Distal       1.70   1.70      73                          ectatic  +-------------+-------+----------+----------+----------+--------+--------+ RT CIA Prox                   166       monophasic                 +-------------+-------+----------+----------+----------+--------+--------+ RT CIA Mid                    328       monophasic                 +-------------+-------+----------+----------+----------+--------+--------+ RT CIA Distal                 380       monophasic                 +-------------+-------+----------+----------+----------+--------+--------+ RT EIA Prox                   128       monophasic                  +-------------+-------+----------+----------+----------+--------+--------+ RT EIA Mid                    108       monophasic                 +-------------+-------+----------+----------+----------+--------+--------+ RT EIA Distal                 93        monophasic                 +-------------+-------+----------+----------+----------+--------+--------+ LT CIA Prox                   100       biphasic                   +-------------+-------+----------+----------+----------+--------+--------+ LT CIA Mid                    314       biphasic                   +-------------+-------+----------+----------+----------+--------+--------+ LT CIA Distal                 212       biphasic                   +-------------+-------+----------+----------+----------+--------+--------+ LT EIA Prox                   217       biphasic                   +-------------+-------+----------+----------+----------+--------+--------+ LT EIA Mid                    212       biphasic                   +-------------+-------+----------+----------+----------+--------+--------+ LT EIA Distal                 172       biphasic                   +-------------+-------+----------+----------+----------+--------+--------+  Aortoiliac atherosclerosis with elevated velocities in the bilateral common iliac arteries and left external iliac artery. Abnormal tapering of the abdominal aorta; mid aorta is ectatic, measuring 2.5 cm.  IVC/Iliac Findings: +--------+------+--------+--------+   IVC   PatentThrombusComments +--------+------+--------+--------+ IVC Proxpatent                 +--------+------+--------+--------+  Technically challenging study due to patient movement. Right leg spasms and back pain. Summary: Abdominal Aorta: The mid abdominal aorta is ectatic, measuring 2.5 cm. Stenosis: +-------------------+----------------------------+ Location           Stenosis                      +-------------------+----------------------------+ Right Common Iliac >50% stenosis                +-------------------+----------------------------+ Left Common Iliac  >50% stenosis                +-------------------+----------------------------+ Left External Iliac>50% stenosis, low end range +-------------------+----------------------------+  Patent IVC. *See table(s) above for measurements and observations. Vascular consult recommended.  Electronically signed by Quay Burow MD on 01/20/2018 at 8:00:51 AM.    Final      LOS: 5 days   Oren Binet, MD  [facility]  If 7PM-7AM, please contact night-coverage  Please page via [email or web address]-Password [email or web address]-click on MD name and type text message  01/31/2018, 1:37 PM

## 2018-01-31 NOTE — Progress Notes (Signed)
Occupational Therapy Treatment Patient Details Name: [name] MRN: [id number] DOB: 05/12/1945 Today's Date: 01/31/2018    History of present illness  73 y.o. female with medical history significant of COPD with chronic hypoxic failure on 4 L oxygen at home, smoker, severe peripheral vascular disease, hypertension, diabetes on oral hypoglycemics and recently developed ischemic ulcer on her foot who presents to the emergency room after having a routine evaluation at cardiology office and found to have hemoglobin of 6.8.   OT comments  Pt making good progress with functional goals and participated in toileting tasks ambulating to bathroom with RW. Pt's O2 remained 90-91% on [place]. OT will continue to follow acutely  Follow Up Recommendations  SNF;Supervision/Assistance - 24 hour    Equipment Recommendations  Other (comment)(TBD at next venue of care)    Recommendations for Other Services      Precautions / Restrictions Precautions Precautions: Other (comment) Precaution Comments: monitor O2 Restrictions Weight Bearing Restrictions: No       Mobility Bed Mobility Overal bed mobility: Modified Independent Bed Mobility: Supine to Sit;Sit to Supine     Supine to sit: Modified independent (Device/Increase time) Sit to supine: Modified independent (Device/Increase time)      Transfers Overall transfer level: Needs assistance Equipment used: Rolling walker (2 wheeled) Transfers: Sit to/from Stand Sit to Stand: Min assist;Min guard Stand pivot transfers: Min guard            Balance Overall balance assessment: Needs assistance Sitting-balance support: Single extremity supported Sitting balance-Leahy Scale: Good     Standing balance support: Bilateral upper extremity supported;Single extremity supported Standing balance-Leahy Scale: Poor                             ADL either performed or assessed with clinical judgement   ADL Overall ADL's : Needs  assistance/impaired     Grooming: Wash/dry hands;Standing;Wash/dry face;Min guard       Lower Body Bathing: Sit to/from stand;Moderate assistance;Minimal assistance Lower Body Bathing Details (indicate cue type and reason): simulated     Lower Body Dressing: Moderate assistance;Sit to/from stand   Toilet Transfer: Ambulation;RW;Min guard;Grab bars   Toileting- Clothing Manipulation and Hygiene: Sit to/from stand;Min guard       Functional mobility during ADLs: Minimal assistance;Rolling walker;Cueing for safety;Min guard General ADL Comments: O2 remained 90-91% throughout activity on [place]     Vision Patient Visual Report: No change from baseline     Perception     Praxis      Cognition Arousal/Alertness: Awake/alert Behavior During Therapy: WFL for tasks assessed/performed Overall Cognitive Status: Within Functional Limits for tasks assessed                                          Exercises     Shoulder Instructions       General Comments      Pertinent Vitals/ Pain       Pain Assessment: No/denies pain  Home Living                                          Prior Functioning/Environment              Frequency  Min 2X/week  Progress Toward Goals  OT Goals(current goals can now be found in the care plan section)  Progress towards OT goals: Progressing toward goals     Plan Discharge plan remains appropriate    Co-evaluation                 AM-PAC OT "6 Clicks" Daily Activity     Outcome Measure   Help from another person eating meals?: None Help from another person taking care of personal grooming?: A Little Help from another person toileting, which includes using toliet, bedpan, or urinal?: A Little Help from another person bathing (including washing, rinsing, drying)?: A Lot Help from another person to put on and taking off regular upper body clothing?: A Little Help from another person  to put on and taking off regular lower body clothing?: A Lot 6 Click Score: 17    End of Session Equipment Utilized During Treatment: Oxygen;Rolling walker  OT Visit Diagnosis: Unsteadiness on feet (R26.81);Muscle weakness (generalized) (M62.81);Dizziness and giddiness (R42)   Activity Tolerance Patient tolerated treatment well   Patient Left with chair alarm set;in bed;with call bell/phone within reach   Nurse Communication          Time: 9480-1655 OT Time Calculation (min): 25 min  Charges: OT General Charges $OT Visit: 1 Visit OT Treatments $Self Care/Home Management : 8-22 mins $Therapeutic Activity: 8-22 mins     Britt Bottom 01/31/2018, 2:35 PM

## 2018-01-31 NOTE — Progress Notes (Signed)
Physical Therapy Treatment Patient Details Name: [name] MRN: [id number] DOB: 1945-07-08 Today's Date: 01/31/2018    History of Present Illness  73 y.o. female with medical history significant of COPD with chronic hypoxic failure on 4 L oxygen at home, smoker, severe peripheral vascular disease, hypertension, diabetes on oral hypoglycemics and recently developed ischemic ulcer on her foot who presents to the emergency room after having a routine evaluation at cardiology office and found to have hemoglobin of 6.8.    PT Comments    Pt performed limited activity during session.  She remains to present with hypoxia and decreased activity tolerance.  Limited gait distance due to bowel incontinence and need to toilet in standing.  Based on decreased functional capacity patient continues to benefit from skilled rehab in a post acute setting.  Plan next session to improve activity to patient tolerance.      Follow Up Recommendations  SNF;Supervision/Assistance - 24 hour(If patient refuses placement she will require HHPT and 24 hour supervision.  )     Equipment Recommendations  None recommended by PT    Recommendations for Other Services       Precautions / Restrictions Precautions Precautions: Other (comment) Precaution Comments: monitor O2 Restrictions Weight Bearing Restrictions: No    Mobility  Bed Mobility Overal bed mobility: Modified Independent Bed Mobility: Supine to Sit     Supine to sit: Modified independent (Device/Increase time) Sit to supine: Modified independent (Device/Increase time)   General bed mobility comments: with bedrail, HOB up  Transfers Overall transfer level: Needs assistance Equipment used: Rolling walker (2 wheeled) Transfers: Sit to/from Stand Sit to Stand: Min guard Stand pivot transfers: Min guard       General transfer comment: Cues for hand placement to and from seated surface.  Pt required cues to push as she attempts to pull  into standing with RW.    Ambulation/Gait Ambulation/Gait assistance: Min guard Gait Distance (Feet): 50 Feet(10 ft x2.  ) Assistive device: Rolling walker (2 wheeled) Gait Pattern/deviations: Step-through pattern;Trunk flexed;Shuffle;Decreased stride length     General Gait Details: Pt continues to present with hypoxia.  Required 4L [place] and O2 sats decreased to 88% with activity.  Required rest break to improve O2 saturations.     Stairs             Wheelchair Mobility    Modified Rankin (Stroke Patients Only)       Balance Overall balance assessment: Needs assistance Sitting-balance support: Single extremity supported Sitting balance-Leahy Scale: Good     Standing balance support: Bilateral upper extremity supported;Single extremity supported Standing balance-Leahy Scale: Poor                              Cognition Arousal/Alertness: Awake/alert Behavior During Therapy: WFL for tasks assessed/performed Overall Cognitive Status: Within Functional Limits for tasks assessed                                        Exercises      General Comments        Pertinent Vitals/Pain Pain Assessment: No/denies pain    Home Living                      Prior Function            PT Goals (current goals  can now be found in the care plan section) Acute Rehab PT Goals Patient Stated Goal: to get better Potential to Achieve Goals: Fair Progress towards PT goals: Progressing toward goals    Frequency    Min 3X/week      PT Plan Discharge plan needs to be updated    Co-evaluation              AM-PAC PT "6 Clicks" Mobility   Outcome Measure  Help needed turning from your back to your side while in a flat bed without using bedrails?: A Little Help needed moving from lying on your back to sitting on the side of a flat bed without using bedrails?: A Little Help needed moving to and from a bed to a chair (including a  wheelchair)?: A Little Help needed standing up from a chair using your arms (e.g., wheelchair or bedside chair)?: A Little Help needed to walk in hospital room?: A Little Help needed climbing 3-5 steps with a railing? : A Little 6 Click Score: 18    End of Session Equipment Utilized During Treatment: Gait belt;Oxygen Activity Tolerance: Patient limited by fatigue Patient left: in chair;with call bell/phone within reach Nurse Communication: Mobility status PT Visit Diagnosis: Difficulty in walking, not elsewhere classified (R26.2)     Time: 4388-8757 PT Time Calculation (min) (ACUTE ONLY): 29 min  Charges:  $Gait Training: 8-22 mins $Therapeutic Activity: 8-22 mins                     Governor Rooks, PTA Acute Rehabilitation Services Pager [phone] Office [phone]     Phillipa Morden [name] 01/31/2018, 4:34 PM

## 2018-01-31 NOTE — Progress Notes (Signed)
[place] still awaiting insurance approval.   Marquarius Lofton LCSW [phone]  

## 2018-01-31 NOTE — Care Management Important Message (Signed)
Important Message  Patient Details  Name: [name] MRN: [id number] Date of Birth: 1945-04-16   Medicare Important Message Given:  Yes    Orbie Pyo 01/31/2018, 7:31 AM

## 2018-02-01 DIAGNOSIS — I998 Other disorder of circulatory system: Secondary | ICD-10-CM

## 2018-02-01 DIAGNOSIS — Z959 Presence of cardiac and vascular implant and graft, unspecified: Secondary | ICD-10-CM

## 2018-02-01 LAB — GLUCOSE, CAPILLARY
GLUCOSE-CAPILLARY: 101 mg/dL — AB (ref 70–99)
Glucose-Capillary: 99 mg/dL (ref 70–99)
Glucose-Capillary: 130 mg/dL — ABNORMAL HIGH (ref 70–99)
Glucose-Capillary: 97 mg/dL (ref 70–99)
Glucose-Capillary: 124 mg/dL — ABNORMAL HIGH (ref 70–99)

## 2018-02-01 MED ORDER — GLUCERNA SHAKE PO LIQD: 237.0000 mL | Freq: Two times a day (BID) | ORAL | 0 refills | Status: DC

## 2018-02-01 MED ORDER — PRO-STAT SUGAR FREE PO LIQD: 30.0000 mL | Freq: Two times a day (BID) | ORAL | 0 refills | Status: DC

## 2018-02-01 MED ORDER — DIAZEPAM 5 MG PO TABS: 5.0000 mg | ORAL_TABLET | Freq: Two times a day (BID) | ORAL | 0 refills | Status: DC | PRN

## 2018-02-01 MED ORDER — IPRATROPIUM-ALBUTEROL 0.5-2.5 (3) MG/3ML IN SOLN: 3.0000 mL | RESPIRATORY_TRACT | Status: AC | PRN

## 2018-02-01 MED ORDER — IPRATROPIUM-ALBUTEROL 0.5-2.5 (3) MG/3ML IN SOLN: 3.0000 mL | Freq: Four times a day (QID) | RESPIRATORY_TRACT | Status: DC | PRN

## 2018-02-01 MED ORDER — FERROUS SULFATE 325 (65 FE) MG PO TABS: 325.0000 mg | ORAL_TABLET | Freq: Two times a day (BID) | ORAL | 3 refills | Status: AC

## 2018-02-01 NOTE — Progress Notes (Signed)
Occupational Therapy Treatment Patient Details Name: [name] MRN: [id number] DOB: 1945-10-10 Today's Date: 02/01/2018    History of present illness  73 y.o. female with medical history significant of COPD with chronic hypoxic failure on 4 L oxygen at home, smoker, severe peripheral vascular disease, hypertension, diabetes on oral hypoglycemics and recently developed ischemic ulcer on her foot who presents to the emergency room after having a routine evaluation at cardiology office and found to have hemoglobin of 6.8.   OT comments  Pt making good progress with functional goals. OT will continue to follow acutely  Follow Up Recommendations  SNF;Supervision/Assistance - 24 hour    Equipment Recommendations  Other (comment)(TBD at next venue of care)    Recommendations for Other Services      Precautions / Restrictions Precautions Precautions: Other (comment) Precaution Comments: monitor O2 Restrictions Weight Bearing Restrictions: No       Mobility Bed Mobility               General bed mobility comments: pt in recliner upon arrival  Transfers Overall transfer level: Needs assistance Equipment used: Rolling walker (2 wheeled) Transfers: Sit to/from Stand Sit to Stand: Min guard         General transfer comment: Cues for hand placement to and from seated surface.  Pt required cues to push as she attempts to pull into standing with RW.      Balance Overall balance assessment: Needs assistance Sitting-balance support: Single extremity supported Sitting balance-Leahy Scale: Good     Standing balance support: Bilateral upper extremity supported;Single extremity supported;During functional activity Standing balance-Leahy Scale: Poor                             ADL either performed or assessed with clinical judgement   ADL Overall ADL's : Needs assistance/impaired Eating/Feeding: Independent;Sitting   Grooming: Wash/dry  hands;Standing;Wash/dry face;Min guard       Lower Body Bathing: Sit to/from stand;Minimal assistance       Lower Body Dressing: Moderate assistance;Sit to/from stand   Toilet Transfer: Ambulation;RW;Min guard;Grab bars   Toileting- Water quality scientist and Hygiene: Sit to/from stand;Min guard   Tub/ Banker: Ambulation;Rolling walker;3 in 1;Grab bars;Min guard   Functional mobility during ADLs: Rolling walker;Cueing for safety;Min guard General ADL Comments: O2 remained 90-91% throughout activity on [place]     Vision Patient Visual Report: No change from baseline     Perception     Praxis      Cognition Arousal/Alertness: Awake/alert Behavior During Therapy: WFL for tasks assessed/performed Overall Cognitive Status: Within Functional Limits for tasks assessed                                          Exercises     Shoulder Instructions       General Comments      Pertinent Vitals/ Pain       Pain Assessment: No/denies pain Pain Score: 0-No pain  Home Living                                          Prior Functioning/Environment              Frequency  Min 2X/week  Progress Toward Goals  OT Goals(current goals can now be found in the care plan section)  Progress towards OT goals: Progressing toward goals     Plan Discharge plan remains appropriate    Co-evaluation                 AM-PAC OT "6 Clicks" Daily Activity     Outcome Measure   Help from another person eating meals?: None Help from another person taking care of personal grooming?: A Little Help from another person toileting, which includes using toliet, bedpan, or urinal?: A Little Help from another person bathing (including washing, rinsing, drying)?: A Lot Help from another person to put on and taking off regular upper body clothing?: A Little Help from another person to put on and taking off regular lower body clothing?: A  Lot 6 Click Score: 17    End of Session Equipment Utilized During Treatment: Oxygen;Rolling walker;Other (comment)(3 in 1)  OT Visit Diagnosis: Unsteadiness on feet (R26.81);Muscle weakness (generalized) (M62.81);Dizziness and giddiness (R42)   Activity Tolerance Patient tolerated treatment well   Patient Left with chair alarm set;in bed;with call bell/phone within reach   Nurse Communication          Time: 9774-1423 OT Time Calculation (min): 19 min  Charges: OT General Charges $OT Visit: 1 Visit OT Treatments $Self Care/Home Management : 8-22 mins    Britt Bottom 02/01/2018, 2:15 PM

## 2018-02-01 NOTE — Discharge Summary (Addendum)
PATIENT DETAILS Name: [name] Age: 73 y.o. Sex: female Date of Birth: 1945/11/04 MRN: [id number]. Admitting Physician: Barb Merino, MD LKT:[id number], Dibas, MD  Admit Date: 01/25/2018 Discharge date: 02/02/2018  Recommendations for Outpatient Follow-up:  1. Follow up with PCP in 1-2 weeks 2. Please obtain BMP/CBC in one week 3. Needs outpatient gastroenterology evaluation for probable endoscopic evaluation of microcytic anemia. 4. Ensure outpatient follow-up with gastroenterology.   Admitted From:  Home  Disposition: SNF   Home Health: No  Equipment/Devices: None  Discharge Condition: Stable  CODE STATUS: FULL CODE  Diet recommendation:  Heart Healthy / Carb Modified   Brief Summary: See H&P, Labs, Consult and Test reports for all details in brief, Patient is a 73 y.o. female with history of COPD with chronic hypoxic respiratory failure on 4 L of oxygen at home, DM-2, hypertension, PAD-who was followed by cardiology for a tentative angiography of her lower extremities-incidentally found to have a hemoglobin of 6.8 with COPD exacerbation, she was referred to the hospitalist service for further inpatient evaluation and treatment.  See below for further details   Brief Hospital Course: Microcytic anemia: Iron panel consistent with iron deficiency-however patient denies any overt blood loss.  FOBT negative.  Hemoglobin stable-did require 2 units of PRBC on admission and now on iron supplementation.  Given overall tenuous respiratory status-best to pursue outpatient endoscopic evaluation once she has recovered completely from COPD exacerbation.  Discussed at length with patient-she is agreeable with this approach.    PCP or attending MD at SNF to consider outpatient GI evaluation.  COPD exacerbation: Overall improved-moving air well-continues to have few scattered rhonchi-has been tapered off steroids-resume usual inhaler regimen on discharge.  Slowly continue to  optimize her medications and titrate down to her usual regimen.  Chronic hypoxic respiratory failure:  Continue O2-continue to slowly titrate down to her usual home regimen.    PAD with ischemic ulcer to right great toe: Continue aspirin/statin-continue silver sulfadiazine per wound care RN.  Patient will need to follow-up with cardiology in the outpatient setting for angiography  Tobacco abuse: Controlled  DM-2: CBG stable-resume diabetic regimen.  Hypertension: Controlled-continue with amlodipine and Avapro  Dyslipidemia: Continue statin  Debility/deconditioning: Secondary to acute illness-still very debilitated and not yet at baseline-PT evaluation recommending SNF-social worker following.    Moderate malnutrition: Continue supplements.  Procedures/Studies: None  Discharge Diagnoses:  Principal Problem:   Anemia Active Problems:   Essential hypertension   Chronic respiratory failure (HCC)   COPD exacerbation (HCC)   Smoker   Dyslipidemia associated with type 2 diabetes mellitus (HCC)   Diabetes mellitus, type II, insulin dependent ([place])   GERD without esophagitis   Hyponatremia   Hypokalemia   PVD (peripheral vascular disease) (HCC)   Ischemic ulcer diabetic foot (HCC)   Malnutrition of moderate degree   Discharge Instructions: Check CBGs before meals and at bedtime  Activity:  As tolerated with Full fall precautions use walker/cane & assistance as needed   Discharge Instructions    Diet - low sodium heart healthy   Complete by:  As directed    Discharge instructions   Complete by:  As directed    Follow with Primary MD  Koirala, Dibas, MD in 1 week  Follow with [facility]-you need a outpatient colonoscopy to evaluate microcytic anemia.  Follow with cardiologist-Dr. Alvester Chou in the next 2 3 weeks.  Please get a complete blood count and chemistry panel checked by your Primary MD at your next visit, and  again as instructed by your Primary  MD.  Get Medicines reviewed and adjusted: Please take all your medications with you for your next visit with your Primary MD  Laboratory/radiological data: Please request your Primary MD to go over all hospital tests and procedure/radiological results at the follow up, please ask your Primary MD to get all Hospital records sent to his/her office.  In some cases, they will be blood work, cultures and biopsy results pending at the time of your discharge. Please request that your primary care M.D. follows up on these results.  Also Note the following: If you experience worsening of your admission symptoms, develop shortness of breath, life threatening emergency, suicidal or homicidal thoughts you must seek medical attention immediately by calling 911 or calling your MD immediately  if symptoms less severe.  You must read complete instructions/literature along with all the possible adverse reactions/side effects for all the Medicines you take and that have been prescribed to you. Take any new Medicines after you have completely understood and accpet all the possible adverse reactions/side effects.   Do not drive when taking Pain medications or sleeping medications (Benzodaizepines)  Do not take more than prescribed Pain, Sleep and Anxiety Medications. It is not advisable to combine anxiety,sleep and pain medications without talking with your primary care practitioner  Special Instructions: If you have smoked or chewed Tobacco  in the last 2 yrs please stop smoking, stop any regular Alcohol  and or any Recreational drug use.  Wear Seat belts while driving.  Please note: You were cared for by a hospitalist during your hospital stay. Once you are discharged, your primary care physician will handle any further medical issues. Please note that NO REFILLS for any discharge medications will be authorized once you are discharged, as it is imperative that you return to your primary care physician (or  establish a relationship with a primary care physician if you do not have one) for your post hospital discharge needs so that they can reassess your need for medications and monitor your lab values.   Increase activity slowly   Complete by:  As directed      Allergies as of 02/02/2018   No Known Allergies     Medication List    STOP taking these medications   albuterol (2.5 MG/3ML) 0.083% nebulizer solution Commonly known as:  PROVENTIL   ciprofloxacin 500 MG tablet Commonly known as:  CIPRO   clindamycin 300 MG capsule Commonly known as:  CLEOCIN     TAKE these medications   amLODipine-valsartan 5-160 MG tablet Commonly known as:  EXFORGE Take 1 tablet by mouth daily.   aspirin EC 81 MG tablet Take 81 mg by mouth daily.   atorvastatin 20 MG tablet Commonly known as:  LIPITOR Take 20 mg by mouth every evening.   BYETTA 10 MCG PEN 10 MCG/0.04ML Sopn injection Generic drug:  exenatide Inject 10 mcg into the skin 2 (two) times daily.   diazepam 5 MG tablet Commonly known as:  VALIUM Take 1 tablet (5 mg total) by mouth every 12 (twelve) hours as needed for anxiety.   feeding supplement (ENSURE ENLIVE) Liqd Take 237 mLs by mouth 2 (two) times daily between meals.   feeding supplement (PRO-STAT SUGAR FREE 64) Liqd Take 30 mLs by mouth 2 (two) times daily.   ferrous sulfate 325 (65 FE) MG tablet Take 1 tablet (325 mg total) by mouth 2 (two) times daily with a meal.   ipratropium 17 MCG/ACT inhaler Commonly  known as:  ATROVENT HFA Inhale 2 puffs into the lungs every 6 (six) hours as needed for wheezing.   ipratropium-albuterol 0.5-2.5 (3) MG/3ML Soln Commonly known as:  DUONEB Take 3 mLs by nebulization every 4 (four) hours as needed.   methocarbamol 500 MG tablet Commonly known as:  ROBAXIN Take 1 tablet (500 mg total) by mouth every 8 (eight) hours as needed for muscle spasms.   omeprazole 20 MG capsule Commonly known as:  PRILOSEC Take 1 tablet by mouth  daily.   PARoxetine 30 MG tablet Commonly known as:  PAXIL Take 30 mg by mouth daily.   silver sulfADIAZINE 1 % cream Commonly known as:  SILVADENE Apply 1 application topically daily.   sitaGLIPtin-metformin 50-1000 MG tablet Commonly known as:  JANUMET Take 1 tablet by mouth 2 (two) times daily with a meal.   SYMBICORT 160-4.5 MCG/ACT inhaler Generic drug:  budesonide-formoterol Inhale 2 puffs into the lungs 2 (two) times daily.       Contact information for follow-up providers    Koirala, Dibas, MD. Schedule an appointment as soon as possible for a visit in 1 week(s).   Specialty:  Family Medicine Contact information: [place] [address] [phone]        Ronnette Juniper, MD. Schedule an appointment as soon as possible for a visit in 2 week(s).   Specialty:  Gastroenterology Why:  for endoscopic evaluation Contact information: [place] [address] [phone]        Lorretta Harp, MD. Schedule an appointment as soon as possible for a visit in 2 week(s).   Specialties:  Cardiology, Radiology Contact information: [address] [place] [address] [phone]            Contact information for after-discharge care    Destination    [facility] .   Service:  Skilled Nursing Contact information: [place] [phone]                 No Known Allergies  Consultations:   None   Other Procedures/Studies: Dg Chest 2 View  Result Date: 01/26/2018 CLINICAL DATA:  Shortness of breath.  History of COPD and pneumonia. EXAM: CHEST - 2 VIEW COMPARISON:  Chest radiograph April 23, 2016 and CT chest October 08, 2016 FINDINGS: Cardiac silhouette is upper limits of normal size. Calcified aortic arch. Chronic interstitial changes increased lung volumes without pleural effusion or focal consolidation. No pneumothorax.  Osteopenia. New age indeterminate T12 severe compression fracture. IMPRESSION: 1. Borderline cardiomegaly and COPD. 2. Osteopenia and age indeterminate severe T12 compression fracture. 3.  Aortic Atherosclerosis (ICD10-I70.0). Electronically Signed   By: Elon Alas M.D.   On: 01/26/2018 01:11   Dg Chest Port 1 View  Result Date: 01/26/2018 CLINICAL DATA:  73 year old female with shortness of breath. EXAM: PORTABLE CHEST 1 VIEW COMPARISON:  01/26/2018 radiographs, chest CT 10/08/2016, and earlier. FINDINGS: Portable AP semi upright view at 0609 hours. Mildly tortuous thoracic aorta with calcified atherosclerosis. Other mediastinal contours are within normal limits. Large lung volumes. Coarse bilateral pulmonary interstitial opacity appears stable since 2018. No pneumothorax, pulmonary edema, or acute pulmonary opacity. Visualized tracheal air column is within normal limits. Paucity of bowel gas in the upper abdomen. Stable visualized osseous structures. IMPRESSION: Stable chronic lung disease. No acute cardiopulmonary abnormality identified. Electronically Signed   By: Genevie Ann M.D.   On: 01/26/2018 06:38  Dg Foot Complete Right  Result Date: 01/17/2018 Please see detailed radiograph report in office note.  Vas [place] Burnard Bunting With/wo Tbi  Result Date: 01/13/2018 LOWER EXTREMITY DOPPLER STUDY Indications: In 11/2017, an arterial Doppler showed an ABI of .60 on the right              and .89 on the left. Patient denies any leg pain. She does have two              open wounds on the bottom and medial right great toe. High Risk Factors: Hypertension, Diabetes, current smoker.  Other Factors: COPD.  Performing Technologist: Sharlett Iles RVT  Examination Guidelines: A complete evaluation includes at minimum, Doppler waveform signals and systolic blood pressure reading at the level of bilateral brachial, anterior tibial, and posterior tibial arteries, when vessel segments are accessible. Bilateral testing is  considered an integral part of a complete examination. Photoelectric Plethysmograph (PPG) waveforms and toe systolic pressure readings are included as required and additional duplex testing as needed. Limited examinations for reoccurring indications may be performed as noted.  ABI Findings: +---------+------------------+-----+----------+--------+ Right    Rt Pressure (mmHg)IndexWaveform  Comment  +---------+------------------+-----+----------+--------+ Brachial 153                                       +---------+------------------+-----+----------+--------+ ATA      89                0.58 monophasic         +---------+------------------+-----+----------+--------+ PTA      89                0.58 monophasic         +---------+------------------+-----+----------+--------+ PERO     90                0.59 monophasic         +---------+------------------+-----+----------+--------+ Great Toe70                0.46 Abnormal           +---------+------------------+-----+----------+--------+ +---------+------------------+-----+-----------+-------+ Left     Lt Pressure (mmHg)IndexWaveform   Comment +---------+------------------+-----+-----------+-------+ Brachial 150                                       +---------+------------------+-----+-----------+-------+ ATA      125               0.82 multiphasic        +---------+------------------+-----+-----------+-------+ PTA      128               0.84 multiphasic        +---------+------------------+-----+-----------+-------+ PERO     125               0.82 biphasic           +---------+------------------+-----+-----------+-------+ Great Toe76                0.50 Abnormal           +---------+------------------+-----+-----------+-------+ +-------+-----------+-----------+------------+------------+ ABI/TBIToday's ABIToday's TBIPrevious ABIPrevious TBI  +-------+-----------+-----------+------------+------------+ Right  .59        .46        .60         .55          +-------+-----------+-----------+------------+------------+ Left   .84        .  50        .89         .0           +-------+-----------+-----------+------------+------------+ Right ABIs appear essentially unchanged compared to prior study on 11/04/2017. Left ABIs appear essentially unchanged compared to prior study on 11/04/2017.  Summary: Right: Resting right ankle-brachial index indicates moderate right lower extremity arterial disease. The right toe-brachial index is abnormal. Left: Resting left ankle-brachial index indicates mild left lower extremity arterial disease. The left toe-brachial index is abnormal.  *See table(s) above for measurements and observations. See Arterial duplex.  Vascular consult recommended. Electronically signed by Ena Dawley MD on 01/13/2018 at 1:15:32 PM.    Final (Updating)    Vas [place] Lower Extremity Arterial Duplex  Result Date: 01/17/2018 LOWER EXTREMITY ARTERIAL DUPLEX STUDY Indications: In 11/2017, an arterial Doppler showed an ABI of .60 on the right              and .89 on the left. Patient denies any leg pain. She does have two              open wounds on the bottom and medial right great toe. High Risk Factors: Hypertension, Diabetes, current smoker.  Other Factors: COPD.  Current ABI: .59 on the right and .84 on the left. Performing Technologist: Sharlett Iles RVT  Examination Guidelines: A complete evaluation includes B-mode imaging, spectral Doppler, color Doppler, and power Doppler as needed of all accessible portions of each vessel. Bilateral testing is considered an integral part of a complete examination. Limited examinations for reoccurring indications may be performed as noted.  Right Duplex Findings: +-----------+--------+-----+---------------+----------+--------+            PSV cm/sRatioStenosis       Waveform  Comments  +-----------+--------+-----+---------------+----------+--------+ CFA Prox   95                          monophasic         +-----------+--------+-----+---------------+----------+--------+ DFA        75                          monophasic         +-----------+--------+-----+---------------+----------+--------+ SFA Prox   259          50-74% stenosismonophasic         +-----------+--------+-----+---------------+----------+--------+ SFA Mid    83                          monophasic         +-----------+--------+-----+---------------+----------+--------+ SFA Distal 98                          monophasic         +-----------+--------+-----+---------------+----------+--------+ POP Prox   71                          monophasic         +-----------+--------+-----+---------------+----------+--------+ POP Mid    62                          monophasic         +-----------+--------+-----+---------------+----------+--------+ POP Distal 58  monophasic         +-----------+--------+-----+---------------+----------+--------+ ATA Prox   69                          monophasic         +-----------+--------+-----+---------------+----------+--------+ ATA Mid    91                          monophasic         +-----------+--------+-----+---------------+----------+--------+ ATA Distal 64                          monophasic         +-----------+--------+-----+---------------+----------+--------+ PTA Prox   65                          monophasic         +-----------+--------+-----+---------------+----------+--------+ PTA Mid    56                          monophasic         +-----------+--------+-----+---------------+----------+--------+ PTA Distal 44                          monophasic         +-----------+--------+-----+---------------+----------+--------+ PERO Prox  39                          monophasic          +-----------+--------+-----+---------------+----------+--------+ PERO Mid   62                          monophasic         +-----------+--------+-----+---------------+----------+--------+ PERO Distal18                          monophasic         +-----------+--------+-----+---------------+----------+--------+ Mixed plaque throughout. A focal velocity elevation of 259 cm/s was obtained at proximal SFA distal to the ostium with post stenotic turbulence with a VR of 3.3. Findings are characteristic of 50-74% stenosis.  Left Duplex Findings: +----------+--------+-----+---------------+--------+---------+           PSV cm/sRatioStenosis       WaveformComments  +----------+--------+-----+---------------+--------+---------+ CFA Prox  179                         biphasicturbulent +----------+--------+-----+---------------+--------+---------+ DFA       75                          biphasic          +----------+--------+-----+---------------+--------+---------+ SFA Prox  240          50-74% stenosisbiphasic          +----------+--------+-----+---------------+--------+---------+ SFA Mid   80                          biphasic          +----------+--------+-----+---------------+--------+---------+ SFA Distal69                          biphasic          +----------+--------+-----+---------------+--------+---------+  POP Prox  58                          biphasic          +----------+--------+-----+---------------+--------+---------+ POP Mid   48                          biphasic          +----------+--------+-----+---------------+--------+---------+ POP Distal44                          biphasic          +----------+--------+-----+---------------+--------+---------+ ATA Prox  55                          biphasic          +----------+--------+-----+---------------+--------+---------+ ATA Mid   63                          biphasic           +----------+--------+-----+---------------+--------+---------+ ATA Distal45                          biphasic          +----------+--------+-----+---------------+--------+---------+ PTA Prox  37                          biphasic          +----------+--------+-----+---------------+--------+---------+ PTA Mid   67                          biphasic          +----------+--------+-----+---------------+--------+---------+ PTA Distal37                          biphasic          +----------+--------+-----+---------------+--------+---------+ PERO Mid  NV                                            +----------+--------+-----+---------------+--------+---------+ Mixed plaque throughout. A focal velocity elevation of 240 cm/s was obtained at proximal SFA distal to the ostium with post stenotic turbulence with a VR of 2.0. Findings are characteristic of 50-74% stenosis.  Technically challenging study due to patient movement. Right leg spasms, left leg cramping and back pain.  Summary: Right: 50-74% stenosis noted in the proximal SFA distal to the ostium. Three vessel run-off. Left: 50-74% stenosis noted in the proximal SFA distal to the ostium. At least a two vessel run-off. Peroneal artery not visualized.  See table(s) above for measurements and observations. See ABI results.  Suggest follow up Aortoiliac Duplex based on abnormal bilateral common femoral artery waveforms.  Vascular consult recommended. Electronically signed by Ena Dawley MD on 01/17/2018 at 1:00:30 PM.    Final    Vas US Aorta/ivc/iliacs  Result Date: 01/20/2018 ABDOMINAL AORTA STUDY Indications: On 01/13/2018, a lower arterial duplex showed 50-74% stenosis in the              bilateral proximal SFA, distal to the ostium. Monophasic flow is  noted throughout the right leg and biphasic flow throughout the              left. Patient denies any leg pain. She does have two open wounds on              the bottom  and medial right great toe.               ABIs performed on 01/13/2018 showed .59 on the right and .84 on the              left. Risk Factors: Hypertension, Diabetes, current smoker. Other Factors: COPD.  Limitations: Air/bowel gas.   Performing Technologist: Sharlett Iles RVT  Examination Guidelines: A complete evaluation includes B-mode imaging, spectral Doppler, color Doppler, and power Doppler as needed of all accessible portions of each vessel. Bilateral testing is considered an integral part of a complete examination. Limited examinations for reoccurring indications may be performed as noted.  Abdominal Aorta Findings: +-------------+-------+----------+----------+----------+--------+--------+ Location     AP (cm)Trans (cm)PSV (cm/s)Waveform  ThrombusComments +-------------+-------+----------+----------+----------+--------+--------+ Proximal     1.90   1.90      86                                   +-------------+-------+----------+----------+----------+--------+--------+ Mid          2.50   2.50      61                                   +-------------+-------+----------+----------+----------+--------+--------+ Distal       1.70   1.70      73                          ectatic  +-------------+-------+----------+----------+----------+--------+--------+ RT CIA Prox                   166       monophasic                 +-------------+-------+----------+----------+----------+--------+--------+ RT CIA Mid                    328       monophasic                 +-------------+-------+----------+----------+----------+--------+--------+ RT CIA Distal                 380       monophasic                 +-------------+-------+----------+----------+----------+--------+--------+ RT EIA Prox                   128       monophasic                 +-------------+-------+----------+----------+----------+--------+--------+ RT EIA Mid                    108        monophasic                 +-------------+-------+----------+----------+----------+--------+--------+ RT EIA Distal                 93        monophasic                 +-------------+-------+----------+----------+----------+--------+--------+  LT CIA Prox                   100       biphasic                   +-------------+-------+----------+----------+----------+--------+--------+ LT CIA Mid                    314       biphasic                   +-------------+-------+----------+----------+----------+--------+--------+ LT CIA Distal                 212       biphasic                   +-------------+-------+----------+----------+----------+--------+--------+ LT EIA Prox                   217       biphasic                   +-------------+-------+----------+----------+----------+--------+--------+ LT EIA Mid                    212       biphasic                   +-------------+-------+----------+----------+----------+--------+--------+ LT EIA Distal                 172       biphasic                   +-------------+-------+----------+----------+----------+--------+--------+ Aortoiliac atherosclerosis with elevated velocities in the bilateral common iliac arteries and left external iliac artery. Abnormal tapering of the abdominal aorta; mid aorta is ectatic, measuring 2.5 cm.  IVC/Iliac Findings: +--------+------+--------+--------+   IVC   PatentThrombusComments +--------+------+--------+--------+ IVC Proxpatent                 +--------+------+--------+--------+  Technically challenging study due to patient movement. Right leg spasms and back pain. Summary: Abdominal Aorta: The mid abdominal aorta is ectatic, measuring 2.5 cm. Stenosis: +-------------------+----------------------------+ Location           Stenosis                     +-------------------+----------------------------+ Right Common Iliac >50% stenosis                 +-------------------+----------------------------+ Left Common Iliac  >50% stenosis                +-------------------+----------------------------+ Left External Iliac>50% stenosis, low end range +-------------------+----------------------------+  Patent IVC. *See table(s) above for measurements and observations. Vascular consult recommended.  Electronically signed by Quay Burow MD on 01/20/2018 at 8:00:51 AM.    Final      TODAY-DAY OF DISCHARGE:  Subjective:   [name] today has no headache,no chest abdominal pain,no new weakness tingling or numbness, feels much better wants to go home today.   Objective:   Blood pressure (!) 138/47, pulse 76, temperature 98.1 F (36.7 C), temperature source Oral, resp. rate 17, height 5\' 8"  (1.727 m), weight 73.1 kg, SpO2 92 %.  Intake/Output Summary (Last 24 hours) at 02/02/2018 0758 Last data filed at 02/02/2018 0516 Gross per 24 hour  Intake 243 ml  Output 600 ml  Net -357 ml   Filed Weights   01/26/18 1612  Weight: 73.1 kg  Exam: Awake Alert, Oriented *3, No new F.N deficits, Normal affect Gunter.AT,PERRAL Supple Neck,No JVD, No cervical lymphadenopathy appriciated.  Symmetrical Chest wall movement, Good air movement bilaterally, CTAB RRR,No Gallops,Rubs or new Murmurs, No Parasternal Heave +ve B.Sounds, Abd Soft, Non tender, No organomegaly appriciated, No rebound -guarding or rigidity. No Cyanosis, Clubbing or edema, No new Rash or bruise   PERTINENT RADIOLOGIC STUDIES: Dg Chest 2 View  Result Date: 01/26/2018 CLINICAL DATA:  Shortness of breath.  History of COPD and pneumonia. EXAM: CHEST - 2 VIEW COMPARISON:  Chest radiograph April 23, 2016 and CT chest October 08, 2016 FINDINGS: Cardiac silhouette is upper limits of normal size. Calcified aortic arch. Chronic interstitial changes increased lung volumes without pleural effusion or focal consolidation. No pneumothorax. Osteopenia. New age indeterminate T12 severe  compression fracture. IMPRESSION: 1. Borderline cardiomegaly and COPD. 2. Osteopenia and age indeterminate severe T12 compression fracture. 3.  Aortic Atherosclerosis (ICD10-I70.0). Electronically Signed   By: Elon Alas M.D.   On: 01/26/2018 01:11   Dg Chest Port 1 View  Result Date: 01/26/2018 CLINICAL DATA:  73 year old female with shortness of breath. EXAM: PORTABLE CHEST 1 VIEW COMPARISON:  01/26/2018 radiographs, chest CT 10/08/2016, and earlier. FINDINGS: Portable AP semi upright view at 0609 hours. Mildly tortuous thoracic aorta with calcified atherosclerosis. Other mediastinal contours are within normal limits. Large lung volumes. Coarse bilateral pulmonary interstitial opacity appears stable since 2018. No pneumothorax, pulmonary edema, or acute pulmonary opacity. Visualized tracheal air column is within normal limits. Paucity of bowel gas in the upper abdomen. Stable visualized osseous structures. IMPRESSION: Stable chronic lung disease. No acute cardiopulmonary abnormality identified. Electronically Signed   By: Genevie Ann M.D.   On: 01/26/2018 06:38   Dg Foot Complete Right  Result Date: 01/17/2018 Please see detailed radiograph report in office note.  Vas [place] Burnard Bunting With/wo Tbi  Result Date: 01/13/2018 LOWER EXTREMITY DOPPLER STUDY Indications: In 11/2017, an arterial Doppler showed an ABI of .60 on the right              and .89 on the left. Patient denies any leg pain. She does have two              open wounds on the bottom and medial right great toe. High Risk Factors: Hypertension, Diabetes, current smoker.  Other Factors: COPD.  Performing Technologist: Sharlett Iles RVT  Examination Guidelines: A complete evaluation includes at minimum, Doppler waveform signals and systolic blood pressure reading at the level of bilateral brachial, anterior tibial, and posterior tibial arteries, when vessel segments are accessible. Bilateral testing is considered an integral part of a complete  examination. Photoelectric Plethysmograph (PPG) waveforms and toe systolic pressure readings are included as required and additional duplex testing as needed. Limited examinations for reoccurring indications may be performed as noted.  ABI Findings: +---------+------------------+-----+----------+--------+ Right    Rt Pressure (mmHg)IndexWaveform  Comment  +---------+------------------+-----+----------+--------+ Brachial 153                                       +---------+------------------+-----+----------+--------+ ATA      89                0.58 monophasic         +---------+------------------+-----+----------+--------+ PTA      89                0.58 monophasic         +---------+------------------+-----+----------+--------+  PERO     90                0.59 monophasic         +---------+------------------+-----+----------+--------+ Great Toe70                0.46 Abnormal           +---------+------------------+-----+----------+--------+ +---------+------------------+-----+-----------+-------+ Left     Lt Pressure (mmHg)IndexWaveform   Comment +---------+------------------+-----+-----------+-------+ Brachial 150                                       +---------+------------------+-----+-----------+-------+ ATA      125               0.82 multiphasic        +---------+------------------+-----+-----------+-------+ PTA      128               0.84 multiphasic        +---------+------------------+-----+-----------+-------+ PERO     125               0.82 biphasic           +---------+------------------+-----+-----------+-------+ Great Toe76                0.50 Abnormal           +---------+------------------+-----+-----------+-------+ +-------+-----------+-----------+------------+------------+ ABI/TBIToday's ABIToday's TBIPrevious ABIPrevious TBI +-------+-----------+-----------+------------+------------+ Right  .59        .46        .60          .55          +-------+-----------+-----------+------------+------------+ Left   .84        .50        .89         .0           +-------+-----------+-----------+------------+------------+ Right ABIs appear essentially unchanged compared to prior study on 11/04/2017. Left ABIs appear essentially unchanged compared to prior study on 11/04/2017.  Summary: Right: Resting right ankle-brachial index indicates moderate right lower extremity arterial disease. The right toe-brachial index is abnormal. Left: Resting left ankle-brachial index indicates mild left lower extremity arterial disease. The left toe-brachial index is abnormal.  *See table(s) above for measurements and observations. See Arterial duplex.  Vascular consult recommended. Electronically signed by Ena Dawley MD on 01/13/2018 at 1:15:32 PM.    Final (Updating)    Vas [place] Lower Extremity Arterial Duplex  Result Date: 01/17/2018 LOWER EXTREMITY ARTERIAL DUPLEX STUDY Indications: In 11/2017, an arterial Doppler showed an ABI of .60 on the right              and .89 on the left. Patient denies any leg pain. She does have two              open wounds on the bottom and medial right great toe. High Risk Factors: Hypertension, Diabetes, current smoker.  Other Factors: COPD.  Current ABI: .59 on the right and .84 on the left. Performing Technologist: Sharlett Iles RVT  Examination Guidelines: A complete evaluation includes B-mode imaging, spectral Doppler, color Doppler, and power Doppler as needed of all accessible portions of each vessel. Bilateral testing is considered an integral part of a complete examination. Limited examinations for reoccurring indications may be performed as noted.  Right Duplex Findings: +-----------+--------+-----+---------------+----------+--------+            PSV cm/sRatioStenosis       Waveform  Comments +-----------+--------+-----+---------------+----------+--------+ CFA Prox   95                           monophasic         +-----------+--------+-----+---------------+----------+--------+ DFA        75                          monophasic         +-----------+--------+-----+---------------+----------+--------+ SFA Prox   259          50-74% stenosismonophasic         +-----------+--------+-----+---------------+----------+--------+ SFA Mid    83                          monophasic         +-----------+--------+-----+---------------+----------+--------+ SFA Distal 98                          monophasic         +-----------+--------+-----+---------------+----------+--------+ POP Prox   71                          monophasic         +-----------+--------+-----+---------------+----------+--------+ POP Mid    62                          monophasic         +-----------+--------+-----+---------------+----------+--------+ POP Distal 58                          monophasic         +-----------+--------+-----+---------------+----------+--------+ ATA Prox   69                          monophasic         +-----------+--------+-----+---------------+----------+--------+ ATA Mid    91                          monophasic         +-----------+--------+-----+---------------+----------+--------+ ATA Distal 64                          monophasic         +-----------+--------+-----+---------------+----------+--------+ PTA Prox   65                          monophasic         +-----------+--------+-----+---------------+----------+--------+ PTA Mid    56                          monophasic         +-----------+--------+-----+---------------+----------+--------+ PTA Distal 44                          monophasic         +-----------+--------+-----+---------------+----------+--------+ PERO Prox  39                          monophasic         +-----------+--------+-----+---------------+----------+--------+ PERO Mid   62  monophasic         +-----------+--------+-----+---------------+----------+--------+ PERO Distal18                          monophasic         +-----------+--------+-----+---------------+----------+--------+ Mixed plaque throughout. A focal velocity elevation of 259 cm/s was obtained at proximal SFA distal to the ostium with post stenotic turbulence with a VR of 3.3. Findings are characteristic of 50-74% stenosis.  Left Duplex Findings: +----------+--------+-----+---------------+--------+---------+           PSV cm/sRatioStenosis       WaveformComments  +----------+--------+-----+---------------+--------+---------+ CFA Prox  179                         biphasicturbulent +----------+--------+-----+---------------+--------+---------+ DFA       75                          biphasic          +----------+--------+-----+---------------+--------+---------+ SFA Prox  240          50-74% stenosisbiphasic          +----------+--------+-----+---------------+--------+---------+ SFA Mid   80                          biphasic          +----------+--------+-----+---------------+--------+---------+ SFA Distal69                          biphasic          +----------+--------+-----+---------------+--------+---------+ POP Prox  58                          biphasic          +----------+--------+-----+---------------+--------+---------+ POP Mid   48                          biphasic          +----------+--------+-----+---------------+--------+---------+ POP Distal44                          biphasic          +----------+--------+-----+---------------+--------+---------+ ATA Prox  55                          biphasic          +----------+--------+-----+---------------+--------+---------+ ATA Mid   63                          biphasic          +----------+--------+-----+---------------+--------+---------+ ATA Distal45                           biphasic          +----------+--------+-----+---------------+--------+---------+ PTA Prox  37                          biphasic          +----------+--------+-----+---------------+--------+---------+ PTA Mid   67                          biphasic          +----------+--------+-----+---------------+--------+---------+  PTA Distal37                          biphasic          +----------+--------+-----+---------------+--------+---------+ PERO Mid  NV                                            +----------+--------+-----+---------------+--------+---------+ Mixed plaque throughout. A focal velocity elevation of 240 cm/s was obtained at proximal SFA distal to the ostium with post stenotic turbulence with a VR of 2.0. Findings are characteristic of 50-74% stenosis.  Technically challenging study due to patient movement. Right leg spasms, left leg cramping and back pain.  Summary: Right: 50-74% stenosis noted in the proximal SFA distal to the ostium. Three vessel run-off. Left: 50-74% stenosis noted in the proximal SFA distal to the ostium. At least a two vessel run-off. Peroneal artery not visualized.  See table(s) above for measurements and observations. See ABI results.  Suggest follow up Aortoiliac Duplex based on abnormal bilateral common femoral artery waveforms.  Vascular consult recommended. Electronically signed by Ena Dawley MD on 01/17/2018 at 1:00:30 PM.    Final    Vas US Aorta/ivc/iliacs  Result Date: 01/20/2018 ABDOMINAL AORTA STUDY Indications: On 01/13/2018, a lower arterial duplex showed 50-74% stenosis in the              bilateral proximal SFA, distal to the ostium. Monophasic flow is              noted throughout the right leg and biphasic flow throughout the              left. Patient denies any leg pain. She does have two open wounds on              the bottom and medial right great toe.               ABIs performed on 01/13/2018 showed .59 on the right and .84 on  the              left. Risk Factors: Hypertension, Diabetes, current smoker. Other Factors: COPD.  Limitations: Air/bowel gas.   Performing Technologist: Sharlett Iles RVT  Examination Guidelines: A complete evaluation includes B-mode imaging, spectral Doppler, color Doppler, and power Doppler as needed of all accessible portions of each vessel. Bilateral testing is considered an integral part of a complete examination. Limited examinations for reoccurring indications may be performed as noted.  Abdominal Aorta Findings: +-------------+-------+----------+----------+----------+--------+--------+ Location     AP (cm)Trans (cm)PSV (cm/s)Waveform  ThrombusComments +-------------+-------+----------+----------+----------+--------+--------+ Proximal     1.90   1.90      86                                   +-------------+-------+----------+----------+----------+--------+--------+ Mid          2.50   2.50      61                                   +-------------+-------+----------+----------+----------+--------+--------+ Distal       1.70   1.70      73  ectatic  +-------------+-------+----------+----------+----------+--------+--------+ RT CIA Prox                   166       monophasic                 +-------------+-------+----------+----------+----------+--------+--------+ RT CIA Mid                    328       monophasic                 +-------------+-------+----------+----------+----------+--------+--------+ RT CIA Distal                 380       monophasic                 +-------------+-------+----------+----------+----------+--------+--------+ RT EIA Prox                   128       monophasic                 +-------------+-------+----------+----------+----------+--------+--------+ RT EIA Mid                    108       monophasic                  +-------------+-------+----------+----------+----------+--------+--------+ RT EIA Distal                 93        monophasic                 +-------------+-------+----------+----------+----------+--------+--------+ LT CIA Prox                   100       biphasic                   +-------------+-------+----------+----------+----------+--------+--------+ LT CIA Mid                    314       biphasic                   +-------------+-------+----------+----------+----------+--------+--------+ LT CIA Distal                 212       biphasic                   +-------------+-------+----------+----------+----------+--------+--------+ LT EIA Prox                   217       biphasic                   +-------------+-------+----------+----------+----------+--------+--------+ LT EIA Mid                    212       biphasic                   +-------------+-------+----------+----------+----------+--------+--------+ LT EIA Distal                 172       biphasic                   +-------------+-------+----------+----------+----------+--------+--------+ Aortoiliac atherosclerosis with elevated velocities in the bilateral common iliac arteries and left external iliac artery. Abnormal tapering of the abdominal aorta; mid aorta is ectatic, measuring 2.5 cm.  IVC/Iliac Findings: +--------+------+--------+--------+  IVC   PatentThrombusComments +--------+------+--------+--------+ IVC Proxpatent                 +--------+------+--------+--------+  Technically challenging study due to patient movement. Right leg spasms and back pain. Summary: Abdominal Aorta: The mid abdominal aorta is ectatic, measuring 2.5 cm. Stenosis: +-------------------+----------------------------+ Location           Stenosis                     +-------------------+----------------------------+ Right Common Iliac >50% stenosis                 +-------------------+----------------------------+ Left Common Iliac  >50% stenosis                +-------------------+----------------------------+ Left External Iliac>50% stenosis, low end range +-------------------+----------------------------+  Patent IVC. *See table(s) above for measurements and observations. Vascular consult recommended.  Electronically signed by Quay Burow MD on 01/20/2018 at 8:00:51 AM.    Final      PERTINENT LAB RESULTS: CBC: No results for input(s): WBC, HGB, HCT, PLT in the last 72 hours. CMET CMP     Component Value Date/Time   NA 136 01/29/2018 0433   NA 130 (L) 01/24/2018 1526   K 5.0 01/29/2018 0433   CL 91 (L) 01/29/2018 0433   CO2 37 (H) 01/29/2018 0433   GLUCOSE 96 01/29/2018 0433   BUN 14 01/29/2018 0433   BUN 10 01/24/2018 1526   CREATININE 0.72 01/29/2018 0433   CREATININE 0.75 01/19/2018 1302   CALCIUM 9.1 01/29/2018 0433   PROT 6.2 (L) 01/29/2018 0433   ALBUMIN 2.9 (L) 01/29/2018 0433   AST 20 01/29/2018 0433   ALT 17 01/29/2018 0433   ALKPHOS 50 01/29/2018 0433   BILITOT 0.8 01/29/2018 0433   GFRNONAA >60 01/29/2018 0433   GFRAA >60 01/29/2018 0433    GFR Estimated Creatinine Clearance: 65.1 mL/min (by C-G formula based on SCr of 0.72 mg/dL). No results for input(s): LIPASE, AMYLASE in the last 72 hours. No results for input(s): CKTOTAL, CKMB, CKMBINDEX, TROPONINI in the last 72 hours. Invalid input(s): POCBNP No results for input(s): DDIMER in the last 72 hours. No results for input(s): HGBA1C in the last 72 hours. No results for input(s): CHOL, HDL, LDLCALC, TRIG, CHOLHDL, LDLDIRECT in the last 72 hours. No results for input(s): TSH, T4TOTAL, T3FREE, THYROIDAB in the last 72 hours.  Invalid input(s): FREET3 No results for input(s): VITAMINB12, FOLATE, FERRITIN, TIBC, IRON, RETICCTPCT in the last 72 hours. Coags: No results for input(s): INR in the last 72 hours.  Invalid input(s): PT Microbiology: No results found  for this or any previous visit (from the past 240 hour(s)).  FURTHER DISCHARGE INSTRUCTIONS:  Get Medicines reviewed and adjusted: Please take all your medications with you for your next visit with your Primary MD  Laboratory/radiological data: Please request your Primary MD to go over all hospital tests and procedure/radiological results at the follow up, please ask your Primary MD to get all Hospital records sent to his/her office.  In some cases, they will be blood work, cultures and biopsy results pending at the time of your discharge. Please request that your primary care M.D. goes through all the records of your hospital data and follows up on these results.  Also Note the following: If you experience worsening of your admission symptoms, develop shortness of breath, life threatening emergency, suicidal or homicidal thoughts you must seek medical attention immediately by calling 911 or calling your MD immediately  if symptoms less severe.  You must read complete instructions/literature along with all the possible adverse reactions/side effects for all the Medicines you take and that have been prescribed to you. Take any new Medicines after you have completely understood and accpet all the possible adverse reactions/side effects.   Do not drive when taking Pain medications or sleeping medications (Benzodaizepines)  Do not take more than prescribed Pain, Sleep and Anxiety Medications. It is not advisable to combine anxiety,sleep and pain medications without talking with your primary care practitioner  Special Instructions: If you have smoked or chewed Tobacco  in the last 2 yrs please stop smoking, stop any regular Alcohol  and or any Recreational drug use.  Wear Seat belts while driving.  Please note: You were cared for by a hospitalist during your hospital stay. Once you are discharged, your primary care physician will handle any further medical issues. Please note that NO REFILLS for  any discharge medications will be authorized once you are discharged, as it is imperative that you return to your primary care physician (or establish a relationship with a primary care physician if you do not have one) for your post hospital discharge needs so that they can reassess your need for medications and monitor your lab values.  Total Time spent coordinating discharge including counseling, education and face to face time equals 35 minutes.  SignedOren Binet 02/02/2018 7:58 AM

## 2018-02-02 LAB — GLUCOSE, CAPILLARY
GLUCOSE-CAPILLARY: 106 mg/dL — AB (ref 70–99)
Glucose-Capillary: 141 mg/dL — ABNORMAL HIGH (ref 70–99)
Glucose-Capillary: 104 mg/dL — ABNORMAL HIGH (ref 70–99)
Glucose-Capillary: 160 mg/dL — ABNORMAL HIGH (ref 70–99)

## 2018-02-02 MED ORDER — ENSURE ENLIVE PO LIQD: 237.0000 mL | Freq: Two times a day (BID) | ORAL | 12 refills | Status: AC

## 2018-02-02 MED ORDER — ENSURE ENLIVE PO LIQD
237.0000 mL | Freq: Two times a day (BID) | ORAL | Status: DC
  Administered 2018-02-02 – 2018-02-03 (×4): 237 mL via ORAL

## 2018-02-02 MED ORDER — PNEUMOCOCCAL VAC POLYVALENT 25 MCG/0.5ML IJ INJ: 0.5000 mL | INJECTION | INTRAMUSCULAR | Status: DC

## 2018-02-02 MED ORDER — PNEUMOCOCCAL VAC POLYVALENT 25 MCG/0.5ML IJ INJ
0.5000 mL | INJECTION | INTRAMUSCULAR | Status: AC
  Administered 2018-02-03: 0.5 mL via INTRAMUSCULAR
  Filled 2018-02-02: qty 0.5

## 2018-02-02 NOTE — Progress Notes (Signed)
[name] alerted CSW that patient was denied for SNF. MD to complete peer to peer [phone]).  [name] Locus Nadezhda Pollitt LCSW [phone]

## 2018-02-02 NOTE — Progress Notes (Signed)
PT Cancellation Note  Patient Details Name: [name] MRN: [id number] DOB: [birth year]   Cancelled Treatment:    Reason Eval/Treat Not Completed: (P) Patient declined, no reason specified(Pt supine in bed and reports she does not want to participate.  PTA educated and encouraged patient on benefits or participation and she remains to decline.)   Quanta Roher [name] 02/02/2018, 9:47 AM  Governor Rooks, PTA Acute Rehabilitation Services Pager [phone] Office [phone]

## 2018-02-02 NOTE — Consult Note (Signed)
   [facility] CM Inpatient Consult   02/02/2018  NITHILA SUMNERS [birth year] [id number]   Patient screened for readmission  hospitalizations to check if potential [place] [facility] services are needed . Chart review reveals patient is currently awaiting skilled nursing facility placement.  Has a brother assisting with decisions.  They are currently pursuing [facility] and awaiting insurance authorization. No needs determined at this time. Please place a [facility] Care Management consult or for questions contact:   Natividad Brood, RN BSN [facility] Liaison  [phone] business mobile phone Toll free office [phone]

## 2018-02-02 NOTE — Progress Notes (Signed)
PROGRESS NOTE        PATIENT DETAILS Name: [name] Age: 73 y.o. Sex: female Date of Birth: 08-21-1945 Admit Date: 01/25/2018 Admitting Physician Barb Merino, MD WHQ:[id number], [place], MD  Brief Narrative: Patient is a 73 y.o. female with history of COPD with chronic hypoxic respiratory failure on 4 L of oxygen at home, DM-2, hypertension, PAD-who was followed by cardiology for a tentative angiography of her lower extremities-incidentally found to have a hemoglobin of 6.8 with COPD exacerbation, she was referred to the hospitalist service for further inpatient evaluation and treatment.  See below for further details 8  Subjective: Feels weak-although overall improved-still appears to be deconditioned and not yet back to baseline.  Assessment/Plan: Microcytic anemia: Iron panel consistent with iron deficiency-however patient denies any overt blood loss.  FOBT negative.  Hemoglobin stable-did require 2 units of PRBC on admission and now on iron supplementation.  Given overall tenuous respiratory status-best to pursue outpatient endoscopic evaluation once she has recovered completely from COPD exacerbation.  Discussed at length with patient-she is agreeable with this approach.    COPD exacerbation: Overall improved-moving air well-continues to have few scattered rhonchi-has been tapered off steroids-continue to encourage incentive spirometry and flutter valve.   Chronic hypoxic respiratory failure: Continue 4 L of oxygen-this is her home regimen.  PAD with ischemic ulcer to right great toe: Continue aspirin/statin-wound care per wound care RN.  Patient will need to follow-up with cardiology in the outpatient setting for angiography  Tobacco abuse: Controlled  DM-2: CBG stable-continue SSI.  Hypertension: Controlled-continue with amlodipine and Avapro  Dyslipidemia: Continue statin  Debility/deconditioning: Secondary to acute illness-still very  debilitated and not yet at baseline.  Patient lives independently-and currently having issues with ambulation-Per PT note of 1/28-only able to walk around 50 feet.  Per social work-SNF has been denied by Customer service manager.  Moderate malnutrition: Continue supplements.  DVT Prophylaxis: Prophylactic Heparin  Code Status: Full code  Family Communication: None at bedside  Disposition Plan: Remain inpatient- SNF on discharge  Antimicrobial agents: Anti-infectives (From admission, onward)   Start     Dose/Rate Route Frequency Ordered Stop   01/26/18 1300  doxycycline (VIBRA-TABS) tablet 100 mg     100 mg Oral Every 12 hours 01/26/18 1208 01/30/18 2243      Procedures: None  CONSULTS:  None  Time spent: 25- minutes-Greater than 50% of this time was spent in counseling, explanation of diagnosis, planning of further management, and coordination of care.  MEDICATIONS: Scheduled Meds: . amLODipine  5 mg Oral Daily   And  . irbesartan  150 mg Oral Daily  . aspirin EC  81 mg Oral Daily  . atorvastatin  20 mg Oral QPM  . budesonide (PULMICORT) nebulizer solution  0.5 mg Nebulization BID  . feeding supplement (ENSURE ENLIVE)  237 mL Oral BID BM  . feeding supplement (PRO-STAT SUGAR FREE 64)  30 mL Oral BID  . ferrous sulfate  325 mg Oral TID WC  . fluticasone furoate-vilanterol  1 puff Inhalation Daily  . heparin  5,000 Units Subcutaneous Q8H  . insulin aspart  0-5 Units Subcutaneous QHS  . insulin aspart  0-9 Units Subcutaneous TID WC  . mouth rinse  15 mL Mouth Rinse BID  . multivitamin with minerals  1 tablet Oral Daily  . nicotine  14 mg Transdermal Daily  .  pantoprazole  40 mg Oral Daily  . PARoxetine  30 mg Oral Daily  . pneumococcal 23 valent vaccine  0.5 mL Intramuscular Tomorrow-1000  . silver sulfADIAZINE   Topical BID  . sodium chloride flush  3 mL Intravenous Q12H   Continuous Infusions: . sodium chloride    . sodium chloride     PRN  Meds:.sodium chloride, acetaminophen **OR** acetaminophen, albuterol, diazepam, guaiFENesin-dextromethorphan, hydrALAZINE, ipratropium-albuterol, methocarbamol, ondansetron **OR** ondansetron (ZOFRAN) IV, polyethylene glycol, senna-docusate, sodium chloride flush   PHYSICAL EXAM: Vital signs: Vitals:   02/02/18 0529 02/02/18 0913 02/02/18 0917 02/02/18 0950  BP:    (!) 131/57  Pulse:      Resp:  16    Temp:      TempSrc:      SpO2: 92% 93% 94% 94%  Weight:      Height:       Filed Weights   01/26/18 1612  Weight: 73.1 kg   Body mass index is 24.51 kg/m.   General appearance:Awake, alert, not in any distress.  Eyes:no scleral icterus. HEENT: Atraumatic and Normocephalic Neck: supple, no JVD. Resp:Good air entry bilaterally, + rhonchi CVS: S1 S2 regular GI: Bowel sounds present, Non tender and not distended with no gaurding, rigidity or rebound. Extremities: B/L Lower Ext shows no edema, both legs are warm to touch Neurology:  Non focal Psychiatric: Normal judgment and insight. Normal mood. Musculoskeletal:No digital cyanosis Skin:No Rash, warm and dry   I have personally reviewed following labs and imaging studies  LABORATORY DATA: CBC: Recent Labs  Lab 01/27/18 0505 01/28/18 0346 01/29/18 0433  WBC 3.0* 6.3 6.3  HGB 9.8* 9.6* 9.7*  HCT 31.4* 31.3* 32.8*  MCV 76.4* 77.9* 78.7*  PLT 166 171 364    Basic Metabolic Panel: Recent Labs  Lab 01/27/18 0505 01/28/18 0346 01/29/18 0433  NA 133* 135 136  K 4.0 3.7 5.0  CL 93* 92* 91*  CO2 31 35* 37*  GLUCOSE 157* 122* 96  BUN 13 13 14   CREATININE 0.69 0.63 0.72  CALCIUM 8.9 8.9 9.1  MG  --  1.5* 1.6*    GFR: Estimated Creatinine Clearance: 65.1 mL/min (by C-G formula based on SCr of 0.72 mg/dL).  Liver Function Tests: Recent Labs  Lab 01/29/18 0433  AST 20  ALT 17  ALKPHOS 50  BILITOT 0.8  PROT 6.2*  ALBUMIN 2.9*   No results for input(s): LIPASE, AMYLASE in the last 168 hours. No results for  input(s): AMMONIA in the last 168 hours.  Coagulation Profile: No results for input(s): INR, PROTIME in the last 168 hours.  Cardiac Enzymes: No results for input(s): CKTOTAL, CKMB, CKMBINDEX, TROPONINI in the last 168 hours.  BNP (last 3 results) No results for input(s): PROBNP in the last 8760 hours.  HbA1C: No results for input(s): HGBA1C in the last 72 hours.  CBG: Recent Labs  Lab 02/01/18 1223 02/01/18 1639 02/01/18 2112 02/02/18 0819 02/02/18 1224  GLUCAP 130* 97 124* 106* 141*    Lipid Profile: No results for input(s): CHOL, HDL, LDLCALC, TRIG, CHOLHDL, LDLDIRECT in the last 72 hours.  Thyroid Function Tests: No results for input(s): TSH, T4TOTAL, FREET4, T3FREE, THYROIDAB in the last 72 hours.  Anemia Panel: No results for input(s): VITAMINB12, FOLATE, FERRITIN, TIBC, IRON, RETICCTPCT in the last 72 hours.  Urine analysis:    Component Value Date/Time   COLORURINE YELLOW 08/24/2017 2000   APPEARANCEUR CLEAR 08/24/2017 2000   LABSPEC 1.018 08/24/2017 2000   PHURINE 5.0 08/24/2017 2000  GLUCOSEU NEGATIVE 08/24/2017 2000   HGBUR NEGATIVE 08/24/2017 2000   BILIRUBINUR NEGATIVE 08/24/2017 2000   KETONESUR 5 (A) 08/24/2017 2000   PROTEINUR NEGATIVE 08/24/2017 2000   NITRITE NEGATIVE 08/24/2017 2000   LEUKOCYTESUR NEGATIVE 08/24/2017 2000    Sepsis Labs: Lactic Acid, Venous    Component Value Date/Time   LATICACIDVEN 1.4 04/19/2016 0221    MICROBIOLOGY: No results found for this or any previous visit (from the past 240 hour(s)).  RADIOLOGY STUDIES/RESULTS: Dg Chest 2 View  Result Date: 01/26/2018 CLINICAL DATA:  Shortness of breath.  History of COPD and pneumonia. EXAM: CHEST - 2 VIEW COMPARISON:  Chest radiograph April 23, 2016 and CT chest October 08, 2016 FINDINGS: Cardiac silhouette is upper limits of normal size. Calcified aortic arch. Chronic interstitial changes increased lung volumes without pleural effusion or focal consolidation. No  pneumothorax. Osteopenia. New age indeterminate T12 severe compression fracture. IMPRESSION: 1. Borderline cardiomegaly and COPD. 2. Osteopenia and age indeterminate severe T12 compression fracture. 3.  Aortic Atherosclerosis (ICD10-I70.0). Electronically Signed   By: Elon Alas M.D.   On: 01/26/2018 01:11   Dg Chest Port 1 View  Result Date: 01/26/2018 CLINICAL DATA:  73 year old female with shortness of breath. EXAM: PORTABLE CHEST 1 VIEW COMPARISON:  01/26/2018 radiographs, chest CT 10/08/2016, and earlier. FINDINGS: Portable AP semi upright view at 0609 hours. Mildly tortuous thoracic aorta with calcified atherosclerosis. Other mediastinal contours are within normal limits. Large lung volumes. Coarse bilateral pulmonary interstitial opacity appears stable since 2018. No pneumothorax, pulmonary edema, or acute pulmonary opacity. Visualized tracheal air column is within normal limits. Paucity of bowel gas in the upper abdomen. Stable visualized osseous structures. IMPRESSION: Stable chronic lung disease. No acute cardiopulmonary abnormality identified. Electronically Signed   By: Genevie Ann M.D.   On: 01/26/2018 06:38   Dg Foot Complete Right  Result Date: 01/17/2018 Please see detailed radiograph report in office note.  Vas [place] Burnard Bunting With/wo Tbi  Result Date: 01/13/2018 LOWER EXTREMITY DOPPLER STUDY Indications: In 11/2017, an arterial Doppler showed an ABI of .60 on the right              and .89 on the left. Patient denies any leg pain. She does have two              open wounds on the bottom and medial right great toe. High Risk Factors: Hypertension, Diabetes, current smoker.  Other Factors: COPD.  Performing Technologist: Sharlett Iles RVT  Examination Guidelines: A complete evaluation includes at minimum, Doppler waveform signals and systolic blood pressure reading at the level of bilateral brachial, anterior tibial, and posterior tibial arteries, when vessel segments are accessible. Bilateral  testing is considered an integral part of a complete examination. Photoelectric Plethysmograph (PPG) waveforms and toe systolic pressure readings are included as required and additional duplex testing as needed. Limited examinations for reoccurring indications may be performed as noted.  ABI Findings: +---------+------------------+-----+----------+--------+ Right    Rt Pressure (mmHg)IndexWaveform  Comment  +---------+------------------+-----+----------+--------+ Brachial 153                                       +---------+------------------+-----+----------+--------+ ATA      89                0.58 monophasic         +---------+------------------+-----+----------+--------+ PTA      89  0.58 monophasic         +---------+------------------+-----+----------+--------+ PERO     90                0.59 monophasic         +---------+------------------+-----+----------+--------+ Great Toe70                0.46 Abnormal           +---------+------------------+-----+----------+--------+ +---------+------------------+-----+-----------+-------+ Left     Lt Pressure (mmHg)IndexWaveform   Comment +---------+------------------+-----+-----------+-------+ Brachial 150                                       +---------+------------------+-----+-----------+-------+ ATA      125               0.82 multiphasic        +---------+------------------+-----+-----------+-------+ PTA      128               0.84 multiphasic        +---------+------------------+-----+-----------+-------+ PERO     125               0.82 biphasic           +---------+------------------+-----+-----------+-------+ Great Toe76                0.50 Abnormal           +---------+------------------+-----+-----------+-------+ +-------+-----------+-----------+------------+------------+ ABI/TBIToday's ABIToday's TBIPrevious ABIPrevious TBI  +-------+-----------+-----------+------------+------------+ Right  .59        .46        .60         .55          +-------+-----------+-----------+------------+------------+ Left   .84        .50        .89         .0           +-------+-----------+-----------+------------+------------+ Right ABIs appear essentially unchanged compared to prior study on 11/04/2017. Left ABIs appear essentially unchanged compared to prior study on 11/04/2017.  Summary: Right: Resting right ankle-brachial index indicates moderate right lower extremity arterial disease. The right toe-brachial index is abnormal. Left: Resting left ankle-brachial index indicates mild left lower extremity arterial disease. The left toe-brachial index is abnormal.  *See table(s) above for measurements and observations. See Arterial duplex.  Vascular consult recommended. Electronically signed by Ena Dawley MD on 01/13/2018 at 1:15:32 PM.    Final (Updating)    Vas [place] Lower Extremity Arterial Duplex  Result Date: 01/17/2018 LOWER EXTREMITY ARTERIAL DUPLEX STUDY Indications: In 11/2017, an arterial Doppler showed an ABI of .60 on the right              and .89 on the left. Patient denies any leg pain. She does have two              open wounds on the bottom and medial right great toe. High Risk Factors: Hypertension, Diabetes, current smoker.  Other Factors: COPD.  Current ABI: .59 on the right and .84 on the left. Performing Technologist: Sharlett Iles RVT  Examination Guidelines: A complete evaluation includes B-mode imaging, spectral Doppler, color Doppler, and power Doppler as needed of all accessible portions of each vessel. Bilateral testing is considered an integral part of a complete examination. Limited examinations for reoccurring indications may be performed as noted.  Right Duplex Findings: +-----------+--------+-----+---------------+----------+--------+  PSV cm/sRatioStenosis       Waveform  Comments  +-----------+--------+-----+---------------+----------+--------+ CFA Prox   95                          monophasic         +-----------+--------+-----+---------------+----------+--------+ DFA        75                          monophasic         +-----------+--------+-----+---------------+----------+--------+ SFA Prox   259          50-74% stenosismonophasic         +-----------+--------+-----+---------------+----------+--------+ SFA Mid    83                          monophasic         +-----------+--------+-----+---------------+----------+--------+ SFA Distal 98                          monophasic         +-----------+--------+-----+---------------+----------+--------+ POP Prox   71                          monophasic         +-----------+--------+-----+---------------+----------+--------+ POP Mid    62                          monophasic         +-----------+--------+-----+---------------+----------+--------+ POP Distal 58                          monophasic         +-----------+--------+-----+---------------+----------+--------+ ATA Prox   69                          monophasic         +-----------+--------+-----+---------------+----------+--------+ ATA Mid    91                          monophasic         +-----------+--------+-----+---------------+----------+--------+ ATA Distal 64                          monophasic         +-----------+--------+-----+---------------+----------+--------+ PTA Prox   65                          monophasic         +-----------+--------+-----+---------------+----------+--------+ PTA Mid    56                          monophasic         +-----------+--------+-----+---------------+----------+--------+ PTA Distal 44                          monophasic         +-----------+--------+-----+---------------+----------+--------+ PERO Prox  39                          monophasic          +-----------+--------+-----+---------------+----------+--------+ PERO Mid  62                          monophasic         +-----------+--------+-----+---------------+----------+--------+ PERO Distal18                          monophasic         +-----------+--------+-----+---------------+----------+--------+ Mixed plaque throughout. A focal velocity elevation of 259 cm/s was obtained at proximal SFA distal to the ostium with post stenotic turbulence with a VR of 3.3. Findings are characteristic of 50-74% stenosis.  Left Duplex Findings: +----------+--------+-----+---------------+--------+---------+           PSV cm/sRatioStenosis       WaveformComments  +----------+--------+-----+---------------+--------+---------+ CFA Prox  179                         biphasicturbulent +----------+--------+-----+---------------+--------+---------+ DFA       75                          biphasic          +----------+--------+-----+---------------+--------+---------+ SFA Prox  240          50-74% stenosisbiphasic          +----------+--------+-----+---------------+--------+---------+ SFA Mid   80                          biphasic          +----------+--------+-----+---------------+--------+---------+ SFA Distal69                          biphasic          +----------+--------+-----+---------------+--------+---------+ POP Prox  58                          biphasic          +----------+--------+-----+---------------+--------+---------+ POP Mid   48                          biphasic          +----------+--------+-----+---------------+--------+---------+ POP Distal44                          biphasic          +----------+--------+-----+---------------+--------+---------+ ATA Prox  55                          biphasic          +----------+--------+-----+---------------+--------+---------+ ATA Mid   63                          biphasic           +----------+--------+-----+---------------+--------+---------+ ATA Distal45                          biphasic          +----------+--------+-----+---------------+--------+---------+ PTA Prox  37                          biphasic          +----------+--------+-----+---------------+--------+---------+ PTA Mid   67  biphasic          +----------+--------+-----+---------------+--------+---------+ PTA Distal37                          biphasic          +----------+--------+-----+---------------+--------+---------+ PERO Mid  NV                                            +----------+--------+-----+---------------+--------+---------+ Mixed plaque throughout. A focal velocity elevation of 240 cm/s was obtained at proximal SFA distal to the ostium with post stenotic turbulence with a VR of 2.0. Findings are characteristic of 50-74% stenosis.  Technically challenging study due to patient movement. Right leg spasms, left leg cramping and back pain.  Summary: Right: 50-74% stenosis noted in the proximal SFA distal to the ostium. Three vessel run-off. Left: 50-74% stenosis noted in the proximal SFA distal to the ostium. At least a two vessel run-off. Peroneal artery not visualized.  See table(s) above for measurements and observations. See ABI results.  Suggest follow up Aortoiliac Duplex based on abnormal bilateral common femoral artery waveforms.  Vascular consult recommended. Electronically signed by Ena Dawley MD on 01/17/2018 at 1:00:30 PM.    Final    Vas US Aorta/ivc/iliacs  Result Date: 01/20/2018 ABDOMINAL AORTA STUDY Indications: On 01/13/2018, a lower arterial duplex showed 50-74% stenosis in the              bilateral proximal SFA, distal to the ostium. Monophasic flow is              noted throughout the right leg and biphasic flow throughout the              left. Patient denies any leg pain. She does have two open wounds on              the bottom  and medial right great toe.               ABIs performed on 01/13/2018 showed .59 on the right and .84 on the              left. Risk Factors: Hypertension, Diabetes, current smoker. Other Factors: COPD.  Limitations: Air/bowel gas.   Performing Technologist: Sharlett Iles RVT  Examination Guidelines: A complete evaluation includes B-mode imaging, spectral Doppler, color Doppler, and power Doppler as needed of all accessible portions of each vessel. Bilateral testing is considered an integral part of a complete examination. Limited examinations for reoccurring indications may be performed as noted.  Abdominal Aorta Findings: +-------------+-------+----------+----------+----------+--------+--------+ Location     AP (cm)Trans (cm)PSV (cm/s)Waveform  ThrombusComments +-------------+-------+----------+----------+----------+--------+--------+ Proximal     1.90   1.90      86                                   +-------------+-------+----------+----------+----------+--------+--------+ Mid          2.50   2.50      61                                   +-------------+-------+----------+----------+----------+--------+--------+ Distal       1.70   1.70      73  ectatic  +-------------+-------+----------+----------+----------+--------+--------+ RT CIA Prox                   166       monophasic                 +-------------+-------+----------+----------+----------+--------+--------+ RT CIA Mid                    328       monophasic                 +-------------+-------+----------+----------+----------+--------+--------+ RT CIA Distal                 380       monophasic                 +-------------+-------+----------+----------+----------+--------+--------+ RT EIA Prox                   128       monophasic                 +-------------+-------+----------+----------+----------+--------+--------+ RT EIA Mid                    108        monophasic                 +-------------+-------+----------+----------+----------+--------+--------+ RT EIA Distal                 93        monophasic                 +-------------+-------+----------+----------+----------+--------+--------+ LT CIA Prox                   100       biphasic                   +-------------+-------+----------+----------+----------+--------+--------+ LT CIA Mid                    314       biphasic                   +-------------+-------+----------+----------+----------+--------+--------+ LT CIA Distal                 212       biphasic                   +-------------+-------+----------+----------+----------+--------+--------+ LT EIA Prox                   217       biphasic                   +-------------+-------+----------+----------+----------+--------+--------+ LT EIA Mid                    212       biphasic                   +-------------+-------+----------+----------+----------+--------+--------+ LT EIA Distal                 172       biphasic                   +-------------+-------+----------+----------+----------+--------+--------+ Aortoiliac atherosclerosis with elevated velocities in the bilateral common iliac arteries and left external iliac artery. Abnormal tapering of the abdominal aorta; mid aorta is ectatic, measuring 2.5 cm.  IVC/Iliac Findings: +--------+------+--------+--------+  IVC   PatentThrombusComments +--------+------+--------+--------+ IVC Proxpatent                 +--------+------+--------+--------+  Technically challenging study due to patient movement. Right leg spasms and back pain. Summary: Abdominal Aorta: The mid abdominal aorta is ectatic, measuring 2.5 cm. Stenosis: +-------------------+----------------------------+ Location           Stenosis                     +-------------------+----------------------------+ Right Common Iliac >50% stenosis                 +-------------------+----------------------------+ Left Common Iliac  >50% stenosis                +-------------------+----------------------------+ Left External Iliac>50% stenosis, low end range +-------------------+----------------------------+  Patent IVC. *See table(s) above for measurements and observations. Vascular consult recommended.  Electronically signed by Quay Burow MD on 01/20/2018 at 8:00:51 AM.    Final      LOS: 7 days   Oren Binet, MD  [facility]  If 7PM-7AM, please contact night-coverage  Please page via [email or web address]-Password [email or web address]-click on MD name and type text message  02/02/2018, 1:29 PM

## 2018-02-02 NOTE — Progress Notes (Signed)
Patient was seen and examined-no major issues overnight-remains stable for discharge to SNF.  See discharge summary for details.

## 2018-02-02 NOTE — Progress Notes (Addendum)
Nutrition Follow-up  DOCUMENTATION CODES:   Non-severe (moderate) malnutrition in context of chronic illness  INTERVENTION:   - Ensure Enlive po BID, each supplement provides 350 kcal and 20 grams of protein  - Continue MVI with minerals daily  - Pro-stat 30 ml BID, each supplement provides 100 kcal and 15 grams of protein  NUTRITION DIAGNOSIS:   Moderate Malnutrition related to chronic illness (COPD) as evidenced by mild muscle depletion, moderate muscle depletion, percent weight loss (12.1% weight loss in 5 months).  Ongoing  GOAL:   Patient will meet greater than or equal to 90% of their needs  Progressing  MONITOR:   PO intake, Supplement acceptance, Weight trends, Skin, Labs  REASON FOR ASSESSMENT:   Consult COPD Protocol  ASSESSMENT:   73 year old female who presented to the ED on 1/22 with low hemoglobin. PMH significant for COPD, T2DM, HLD, HTN, tobacco use, severe PVD, and recent ischemic ulcer on foot.  Discharge orders in. Pt to d/c to [place].  No new weight since admission.  Spoke with pt at bedside who reports improved appetite and PO intake. Pt states, "I like the shakes." Pt denies any nutrition-related issues at this time.  Meal Completion: 25-100% x last 5 recorded meals  Medications reviewed and include: Ensure Enlive Bid, Glucerna BID, Pro-stat BID, ferrous sulfate, SSI, MVI with minerals, Protonix  Labs reviewed. CBG's: 106, 124, 97 x 24 hours  Diet Order:   Diet Order            Diet - low sodium heart healthy        Diet heart healthy/carb modified Room service appropriate? Yes; Fluid consistency: Thin  Diet effective now              EDUCATION NEEDS:   No education needs have been identified at this time  Skin:  Skin Assessment: Skin Integrity Issues: Diabetic Ulcer: L toe  Last BM:  1/30 (small type5)  Height:   Ht Readings from Last 1 Encounters:  02/02/18 5\' 8"  (1.727 m)    Weight:   Wt Readings from Last 1  Encounters:  01/26/18 73.1 kg    Ideal Body Weight:  63.6 kg  BMI:  Body mass index is 24.51 kg/m.  Estimated Nutritional Needs:   Kcal:  1700-1900  Protein:  85-95 grams  Fluid:  1.7-1.9 L    Gaynell Face, MS, RD, LDN Inpatient Clinical Dietitian Pager: [phone] Weekend/After Hours: [phone]

## 2018-02-02 NOTE — Progress Notes (Signed)
Patient took O2 off to ambulate to bathroom and back to bed. When patient returned to bed, SpO2 79-80%. O2 2L [place] re-applied. Second extension tubing applied to O2. Patient encouraged to keep O2 [place] on, even when going to bathroom. Continuous SpO2 on patient at this time. Will continue to monitor.

## 2018-02-02 NOTE — Plan of Care (Signed)
?  Problem: Education: ?Goal: Knowledge of disease or condition will improve ?Outcome: Progressing ?  ?Problem: Education: ?Goal: Knowledge of the prescribed therapeutic regimen will improve ?Outcome: Progressing ?  ?Problem: Respiratory: ?Goal: Levels of oxygenation will improve ?Outcome: Progressing ?  ?

## 2018-02-03 LAB — GLUCOSE, CAPILLARY
Glucose-Capillary: 106 mg/dL — ABNORMAL HIGH (ref 70–99)
Glucose-Capillary: 74 mg/dL (ref 70–99)
Glucose-Capillary: 91 mg/dL (ref 70–99)
Glucose-Capillary: 101 mg/dL — ABNORMAL HIGH (ref 70–99)
Glucose-Capillary: 136 mg/dL — ABNORMAL HIGH (ref 70–99)

## 2018-02-03 NOTE — Progress Notes (Signed)
Insurance approval received.  Percell Locus Indie Boehne LCSW [id number]

## 2018-02-03 NOTE — Progress Notes (Signed)
Physical Therapy Treatment Patient Details Name: [name] MRN: [id number] DOB: Jul 01, 1945 Today's Date: 02/03/2018    History of Present Illness  73 y.o. female with medical history significant of COPD with chronic hypoxic failure on 4 L oxygen at home, smoker, severe peripheral vascular disease, hypertension, diabetes on oral hypoglycemics and recently developed ischemic ulcer on her foot who presents to the emergency room after having a routine evaluation at cardiology office and found to have hemoglobin of 6.8.    PT Comments    Pt performed gait training on 3L [place].  She did desaturate to 87%.  Pt continues to fatigue quickly and requires cues for pacing.  After gait training patient performed seated and supine exercises.  Pt to d/c to SNF and continues to await insurance authorization.  Will continue to provide intervention while patient is hospitalized.      Follow Up Recommendations  SNF;Supervision/Assistance - 24 hour(If she refuses placement or is denied with require HHPT and 24 hour supervision.  )     Equipment Recommendations  None recommended by PT    Recommendations for Other Services       Precautions / Restrictions Precautions Precautions: Other (comment) Precaution Comments: monitor O2 Restrictions Weight Bearing Restrictions: No Other Position/Activity Restrictions: R darco shoe donned.      Mobility  Bed Mobility               General bed mobility comments: pt in recliner upon arrival  Transfers Overall transfer level: Needs assistance Equipment used: Rolling walker (2 wheeled) Transfers: Sit to/from Stand Sit to Stand: Min guard Stand pivot transfers: Min guard       General transfer comment: Cues for hand placement to and from seated surface.  Pt performed slow and guard.    Ambulation/Gait Ambulation/Gait assistance: Min guard Gait Distance (Feet): 60 Feet Assistive device: Rolling walker (2 wheeled) Gait Pattern/deviations:  Step-through pattern;Trunk flexed;Shuffle;Decreased stride length     General Gait Details: Pt continues to present with hypoxia.  Required 3L [place] and O2 sats decreased to 88% with activity.  Required rest break to improve O2 saturations. after gait training.     Stairs             Wheelchair Mobility    Modified Rankin (Stroke Patients Only)       Balance Overall balance assessment: Needs assistance Sitting-balance support: Single extremity supported Sitting balance-Leahy Scale: Good       Standing balance-Leahy Scale: Poor                              Cognition Arousal/Alertness: Awake/alert Behavior During Therapy: WFL for tasks assessed/performed Overall Cognitive Status: Within Functional Limits for tasks assessed                                        Exercises General Exercises - Lower Extremity Ankle Circles/Pumps: AROM;Both;10 reps;Supine Quad Sets: AROM;Both;10 reps;Supine Long Arc Quad: AROM;10 reps;Seated;Both Heel Slides: AROM;Both;10 reps;Supine Hip ABduction/ADduction: AROM;Both;10 reps;Supine Straight Leg Raises: AROM;Both;10 reps;Supine Hip Flexion/Marching: AROM;10 reps;Both;Seated    General Comments        Pertinent Vitals/Pain Pain Assessment: 0-10 Pain Score: 0-No pain Pain Intervention(s): Monitored during session;Repositioned    Home Living  Prior Function            PT Goals (current goals can now be found in the care plan section) Acute Rehab PT Goals Patient Stated Goal: to get better Potential to Achieve Goals: Fair Progress towards PT goals: Progressing toward goals    Frequency    Min 3X/week      PT Plan Discharge plan needs to be updated    Co-evaluation              AM-PAC PT "6 Clicks" Mobility   Outcome Measure  Help needed turning from your back to your side while in a flat bed without using bedrails?: A Little Help needed moving from  lying on your back to sitting on the side of a flat bed without using bedrails?: A Little Help needed moving to and from a bed to a chair (including a wheelchair)?: A Little Help needed standing up from a chair using your arms (e.g., wheelchair or bedside chair)?: A Little Help needed to walk in hospital room?: A Little Help needed climbing 3-5 steps with a railing? : A Little 6 Click Score: 18    End of Session Equipment Utilized During Treatment: Gait belt;Oxygen Activity Tolerance: Patient limited by fatigue Patient left: in chair;with call bell/phone within reach Nurse Communication: Mobility status PT Visit Diagnosis: Difficulty in walking, not elsewhere classified (R26.2)     Time: 4718-5501 PT Time Calculation (min) (ACUTE ONLY): 14 min  Charges:  $Gait Training: 8-22 mins                     Governor Rooks, PTA Acute Rehabilitation Services Pager [phone] Office [phone]     Layah Skousen [name] 02/03/2018, 3:18 PM

## 2018-02-03 NOTE — Progress Notes (Signed)
Patient was seen and examined-no major issues overnight-remains stable for discharge to SNF.  See discharge summary for details.  Note-did peer-to-peer with MD with insurance company-approved for SNF.

## 2018-02-03 NOTE — Progress Notes (Signed)
Patient off the unit via PTAR.  vss and patient [place] and oriented.

## 2018-02-03 NOTE — Progress Notes (Signed)
Called report to [facility] patient going into room 1206

## 2018-02-03 NOTE — Progress Notes (Signed)
[name] awaiting official insurance approval number before transport can be arranged.   Tammy Locus Mikaelyn Arthurs LCSW [phone]

## 2018-02-03 NOTE — Progress Notes (Signed)
Nsg Discharge Note  Admit Date:  01/25/2018 Discharge date: 02/03/2018   GAYLYN BERISH to be D/C'd [facility] per MD order.  AVS completed.  Copy for chart, and copy for patient signed, and dated. Patient/caregiver able to verbalize understanding.  Discharge Medication: Allergies as of 02/03/2018   No Known Allergies     Medication List    STOP taking these medications   albuterol (2.5 MG/3ML) 0.083% nebulizer solution Commonly known as:  PROVENTIL   ciprofloxacin 500 MG tablet Commonly known as:  CIPRO   clindamycin 300 MG capsule Commonly known as:  CLEOCIN     TAKE these medications   amLODipine-valsartan 5-160 MG tablet Commonly known as:  EXFORGE Take 1 tablet by mouth daily.   aspirin EC 81 MG tablet Take 81 mg by mouth daily.   atorvastatin 20 MG tablet Commonly known as:  LIPITOR Take 20 mg by mouth every evening.   BYETTA 10 MCG PEN 10 MCG/0.04ML Sopn injection Generic drug:  exenatide Inject 10 mcg into the skin 2 (two) times daily.   diazepam 5 MG tablet Commonly known as:  VALIUM Take 1 tablet (5 mg total) by mouth every 12 (twelve) hours as needed for anxiety.   feeding supplement (ENSURE ENLIVE) Liqd Take 237 mLs by mouth 2 (two) times daily between meals.   feeding supplement (PRO-STAT SUGAR FREE 64) Liqd Take 30 mLs by mouth 2 (two) times daily.   ferrous sulfate 325 (65 FE) MG tablet Take 1 tablet (325 mg total) by mouth 2 (two) times daily with a meal.   ipratropium 17 MCG/ACT inhaler Commonly known as:  ATROVENT HFA Inhale 2 puffs into the lungs every 6 (six) hours as needed for wheezing.   ipratropium-albuterol 0.5-2.5 (3) MG/3ML Soln Commonly known as:  DUONEB Take 3 mLs by nebulization every 4 (four) hours as needed.   methocarbamol 500 MG tablet Commonly known as:  ROBAXIN Take 1 tablet (500 mg total) by mouth every 8 (eight) hours as needed for muscle spasms.   omeprazole 20 MG capsule Commonly known as:  PRILOSEC Take  1 tablet by mouth daily.   PARoxetine 30 MG tablet Commonly known as:  PAXIL Take 30 mg by mouth daily.   silver sulfADIAZINE 1 % cream Commonly known as:  SILVADENE Apply 1 application topically daily.   sitaGLIPtin-metformin 50-1000 MG tablet Commonly known as:  JANUMET Take 1 tablet by mouth 2 (two) times daily with a meal.   SYMBICORT 160-4.5 MCG/ACT inhaler Generic drug:  budesonide-formoterol Inhale 2 puffs into the lungs 2 (two) times daily.       Discharge Assessment: Vitals:   02/03/18 0927 02/03/18 1529  BP:  (!) 135/49  Pulse:  97  Resp:  18  Temp:  98.2 F (36.8 C)  SpO2: 94% 93%  Patient has R toe wound. IV catheter discontinued intact. Site without signs and symptoms of complications - no redness or edema noted at insertion site, patient denies c/o pain - only slight tenderness at site.  Dressing with slight pressure applied.  D/c Instructions-Education: Discharge instructions given to patient/family with verbalized understanding. D/c education completed with patient/family including follow up instructions, medication list, d/c activities limitations if indicated, with other d/c instructions as indicated by MD - patient able to verbalize understanding, all questions fully answered. Patient instructed to return to ED, call 911, or call MD for any changes in condition.  Patient escorted via [place], and D/C home via private auto.  Delson Dulworth Margaretha Sheffield, RN 02/03/2018  4:23 PM

## 2018-02-03 NOTE — Progress Notes (Addendum)
Patient will DC to: [facility] Anticipated DC date: 02/03/2018 Family notified: Brother Transport by: Corey Harold  Brother will pick up patient's wheelchair on [place] store until then.   Per MD patient ready for DC to [facility]. RN, patient, patient's family, and facility notified of DC. Discharge Summary and FL2 sent to facility. RN to call report prior to discharge [phone] room 1206P). DC packet on chart. Ambulance transport requested for patient.   CSW will sign off for now as social work intervention is no longer needed. Please consult [place] again if new needs arise.  Cedric Fishman, LCSW Clinical Social Worker [phone]

## 2018-02-03 NOTE — Care Management Note (Addendum)
Case Management Note  Patient Details  Name: [name] MRN: [id number] Date of Birth: 06/14/1945  Subjective/Objective:   Anemia, HTN, COPD, DM                 Action/Plan: 01/27/2018 NCM spoke to pt and offered choice for HH/CMS list provided and placed on chart. Pt declines HH. Pt states she had [facility] in the past but her brother will be with her at home. Pt has wheelchair, RW, bedside commode, oxygen ([employer]) and neb machine.   PCP  Lujean Amel MD  02/03/2018 Pt prefers to go to rehab. Agreeable to [facility] with [facility] if denied SNF. Waiting Peer to Peer review with [facility]. Pt approved for SNF per CSW.   Expected Discharge Date:  02/01/18               Expected Discharge Plan:  [place]  In-House Referral:  NA  Discharge planning Services  CM Consult  Post Acute Care Choice:  Home Health Choice offered to:  Patient  DME Arranged:  N/A DME Agency:  NA  HH Arranged:  PT, OT HH Agency:  [facility]  Status of Service:  In process, will continue to follow  If discussed at Long Length of Stay Meetings, dates discussed:    Additional Comments:  Erenest Rasher, RN 02/03/2018, 1:21 PM

## 2018-02-04 DIAGNOSIS — J9611 Chronic respiratory failure with hypoxia: Secondary | ICD-10-CM | POA: Diagnosis not present

## 2018-02-04 DIAGNOSIS — I10 Essential (primary) hypertension: Secondary | ICD-10-CM | POA: Diagnosis not present

## 2018-02-04 DIAGNOSIS — R69 Illness, unspecified: Secondary | ICD-10-CM | POA: Diagnosis not present

## 2018-02-04 DIAGNOSIS — J9610 Chronic respiratory failure, unspecified whether with hypoxia or hypercapnia: Secondary | ICD-10-CM | POA: Diagnosis not present

## 2018-02-04 DIAGNOSIS — I7389 Other specified peripheral vascular diseases: Secondary | ICD-10-CM | POA: Diagnosis not present

## 2018-02-04 DIAGNOSIS — R41841 Cognitive communication deficit: Secondary | ICD-10-CM | POA: Diagnosis not present

## 2018-02-04 DIAGNOSIS — E119 Type 2 diabetes mellitus without complications: Secondary | ICD-10-CM | POA: Diagnosis not present

## 2018-02-04 DIAGNOSIS — E1159 Type 2 diabetes mellitus with other circulatory complications: Secondary | ICD-10-CM | POA: Diagnosis not present

## 2018-02-04 DIAGNOSIS — J984 Other disorders of lung: Secondary | ICD-10-CM | POA: Diagnosis not present

## 2018-02-04 DIAGNOSIS — I7025 Atherosclerosis of native arteries of other extremities with ulceration: Secondary | ICD-10-CM | POA: Diagnosis not present

## 2018-02-04 DIAGNOSIS — E440 Moderate protein-calorie malnutrition: Secondary | ICD-10-CM | POA: Diagnosis not present

## 2018-02-04 DIAGNOSIS — D508 Other iron deficiency anemias: Secondary | ICD-10-CM | POA: Diagnosis not present

## 2018-02-04 DIAGNOSIS — R498 Other voice and resonance disorders: Secondary | ICD-10-CM | POA: Diagnosis not present

## 2018-02-04 DIAGNOSIS — M6281 Muscle weakness (generalized): Secondary | ICD-10-CM | POA: Diagnosis not present

## 2018-02-04 DIAGNOSIS — J449 Chronic obstructive pulmonary disease, unspecified: Secondary | ICD-10-CM | POA: Diagnosis not present

## 2018-02-04 DIAGNOSIS — R278 Other lack of coordination: Secondary | ICD-10-CM | POA: Diagnosis not present

## 2018-02-06 DIAGNOSIS — I7389 Other specified peripheral vascular diseases: Secondary | ICD-10-CM | POA: Diagnosis not present

## 2018-02-06 DIAGNOSIS — D508 Other iron deficiency anemias: Secondary | ICD-10-CM | POA: Diagnosis not present

## 2018-02-06 DIAGNOSIS — R69 Illness, unspecified: Secondary | ICD-10-CM | POA: Diagnosis not present

## 2018-02-06 DIAGNOSIS — J984 Other disorders of lung: Secondary | ICD-10-CM | POA: Diagnosis not present

## 2018-02-07 ENCOUNTER — Encounter (HOSPITAL_COMMUNITY): Payer: Medicare HMO

## 2018-02-07 DIAGNOSIS — J9611 Chronic respiratory failure with hypoxia: Secondary | ICD-10-CM | POA: Diagnosis not present

## 2018-02-07 DIAGNOSIS — D508 Other iron deficiency anemias: Secondary | ICD-10-CM | POA: Diagnosis not present

## 2018-02-07 DIAGNOSIS — J984 Other disorders of lung: Secondary | ICD-10-CM | POA: Diagnosis not present

## 2018-02-07 DIAGNOSIS — R69 Illness, unspecified: Secondary | ICD-10-CM | POA: Diagnosis not present

## 2018-02-14 DIAGNOSIS — I7389 Other specified peripheral vascular diseases: Secondary | ICD-10-CM | POA: Diagnosis not present

## 2018-02-14 DIAGNOSIS — E1159 Type 2 diabetes mellitus with other circulatory complications: Secondary | ICD-10-CM | POA: Diagnosis not present

## 2018-02-14 DIAGNOSIS — R69 Illness, unspecified: Secondary | ICD-10-CM | POA: Diagnosis not present

## 2018-02-14 DIAGNOSIS — J984 Other disorders of lung: Secondary | ICD-10-CM | POA: Diagnosis not present

## 2018-02-14 DIAGNOSIS — D508 Other iron deficiency anemias: Secondary | ICD-10-CM | POA: Diagnosis not present

## 2018-02-14 DIAGNOSIS — I7025 Atherosclerosis of native arteries of other extremities with ulceration: Secondary | ICD-10-CM | POA: Diagnosis not present

## 2018-02-14 DIAGNOSIS — I10 Essential (primary) hypertension: Secondary | ICD-10-CM | POA: Diagnosis not present

## 2018-02-15 ENCOUNTER — Ambulatory Visit: Payer: Medicare HMO | Admitting: Cardiovascular Disease

## 2018-02-28 DIAGNOSIS — Z79899 Other long term (current) drug therapy: Secondary | ICD-10-CM | POA: Diagnosis not present

## 2018-02-28 DIAGNOSIS — E538 Deficiency of other specified B group vitamins: Secondary | ICD-10-CM | POA: Diagnosis not present

## 2018-02-28 DIAGNOSIS — I10 Essential (primary) hypertension: Secondary | ICD-10-CM | POA: Diagnosis not present

## 2018-02-28 DIAGNOSIS — J439 Emphysema, unspecified: Secondary | ICD-10-CM | POA: Diagnosis not present

## 2018-02-28 DIAGNOSIS — L97519 Non-pressure chronic ulcer of other part of right foot with unspecified severity: Secondary | ICD-10-CM | POA: Diagnosis not present

## 2018-02-28 DIAGNOSIS — E11621 Type 2 diabetes mellitus with foot ulcer: Secondary | ICD-10-CM | POA: Diagnosis not present

## 2018-02-28 DIAGNOSIS — D649 Anemia, unspecified: Secondary | ICD-10-CM | POA: Diagnosis not present

## 2018-02-28 DIAGNOSIS — Z7984 Long term (current) use of oral hypoglycemic drugs: Secondary | ICD-10-CM | POA: Diagnosis not present

## 2018-03-04 DIAGNOSIS — J449 Chronic obstructive pulmonary disease, unspecified: Secondary | ICD-10-CM | POA: Diagnosis not present

## 2018-04-02 DIAGNOSIS — J449 Chronic obstructive pulmonary disease, unspecified: Secondary | ICD-10-CM | POA: Diagnosis not present

## 2018-04-03 ENCOUNTER — Ambulatory Visit: Payer: Medicare HMO | Admitting: Podiatry

## 2018-05-03 DIAGNOSIS — J449 Chronic obstructive pulmonary disease, unspecified: Secondary | ICD-10-CM | POA: Diagnosis not present

## 2018-06-02 DIAGNOSIS — J449 Chronic obstructive pulmonary disease, unspecified: Secondary | ICD-10-CM | POA: Diagnosis not present

## 2018-07-03 DIAGNOSIS — J449 Chronic obstructive pulmonary disease, unspecified: Secondary | ICD-10-CM | POA: Diagnosis not present

## 2018-08-02 DIAGNOSIS — J449 Chronic obstructive pulmonary disease, unspecified: Secondary | ICD-10-CM | POA: Diagnosis not present

## 2018-09-02 DIAGNOSIS — J449 Chronic obstructive pulmonary disease, unspecified: Secondary | ICD-10-CM | POA: Diagnosis not present

## 2018-09-22 DIAGNOSIS — E7800 Pure hypercholesterolemia, unspecified: Secondary | ICD-10-CM | POA: Diagnosis not present

## 2018-09-22 DIAGNOSIS — L749 Eccrine sweat disorder, unspecified: Secondary | ICD-10-CM | POA: Diagnosis not present

## 2018-09-22 DIAGNOSIS — J439 Emphysema, unspecified: Secondary | ICD-10-CM | POA: Diagnosis not present

## 2018-09-22 DIAGNOSIS — I10 Essential (primary) hypertension: Secondary | ICD-10-CM | POA: Diagnosis not present

## 2018-09-22 DIAGNOSIS — E1142 Type 2 diabetes mellitus with diabetic polyneuropathy: Secondary | ICD-10-CM | POA: Diagnosis not present

## 2018-09-22 DIAGNOSIS — R69 Illness, unspecified: Secondary | ICD-10-CM | POA: Diagnosis not present

## 2018-09-22 DIAGNOSIS — Z7984 Long term (current) use of oral hypoglycemic drugs: Secondary | ICD-10-CM | POA: Diagnosis not present

## 2018-09-22 DIAGNOSIS — Z79899 Other long term (current) drug therapy: Secondary | ICD-10-CM | POA: Diagnosis not present

## 2018-09-22 DIAGNOSIS — E538 Deficiency of other specified B group vitamins: Secondary | ICD-10-CM | POA: Diagnosis not present

## 2018-10-03 DIAGNOSIS — J449 Chronic obstructive pulmonary disease, unspecified: Secondary | ICD-10-CM | POA: Diagnosis not present

## 2018-10-27 DIAGNOSIS — Z7984 Long term (current) use of oral hypoglycemic drugs: Secondary | ICD-10-CM | POA: Diagnosis not present

## 2018-10-27 DIAGNOSIS — E7800 Pure hypercholesterolemia, unspecified: Secondary | ICD-10-CM | POA: Diagnosis not present

## 2018-10-27 DIAGNOSIS — L749 Eccrine sweat disorder, unspecified: Secondary | ICD-10-CM | POA: Diagnosis not present

## 2018-10-27 DIAGNOSIS — E538 Deficiency of other specified B group vitamins: Secondary | ICD-10-CM | POA: Diagnosis not present

## 2018-10-27 DIAGNOSIS — E1142 Type 2 diabetes mellitus with diabetic polyneuropathy: Secondary | ICD-10-CM | POA: Diagnosis not present

## 2018-10-27 DIAGNOSIS — Z23 Encounter for immunization: Secondary | ICD-10-CM | POA: Diagnosis not present

## 2018-10-27 DIAGNOSIS — Z79899 Other long term (current) drug therapy: Secondary | ICD-10-CM | POA: Diagnosis not present

## 2018-11-02 DIAGNOSIS — J449 Chronic obstructive pulmonary disease, unspecified: Secondary | ICD-10-CM | POA: Diagnosis not present

## 2018-12-03 DIAGNOSIS — J449 Chronic obstructive pulmonary disease, unspecified: Secondary | ICD-10-CM | POA: Diagnosis not present

## 2018-12-08 DIAGNOSIS — M545 Low back pain: Secondary | ICD-10-CM | POA: Diagnosis not present

## 2018-12-19 DIAGNOSIS — M545 Low back pain: Secondary | ICD-10-CM | POA: Diagnosis not present

## 2018-12-19 DIAGNOSIS — S22080A Wedge compression fracture of T11-T12 vertebra, initial encounter for closed fracture: Secondary | ICD-10-CM | POA: Diagnosis not present

## 2018-12-19 DIAGNOSIS — M546 Pain in thoracic spine: Secondary | ICD-10-CM | POA: Diagnosis not present

## 2018-12-25 DIAGNOSIS — E2839 Other primary ovarian failure: Secondary | ICD-10-CM | POA: Diagnosis not present

## 2018-12-25 DIAGNOSIS — M439 Deforming dorsopathy, unspecified: Secondary | ICD-10-CM | POA: Diagnosis not present

## 2018-12-25 DIAGNOSIS — I10 Essential (primary) hypertension: Secondary | ICD-10-CM | POA: Diagnosis not present

## 2018-12-25 DIAGNOSIS — M545 Low back pain: Secondary | ICD-10-CM | POA: Diagnosis not present

## 2018-12-25 DIAGNOSIS — G8929 Other chronic pain: Secondary | ICD-10-CM | POA: Diagnosis not present

## 2018-12-27 ENCOUNTER — Other Ambulatory Visit: Payer: Self-pay | Admitting: Family Medicine

## 2018-12-27 DIAGNOSIS — E2839 Other primary ovarian failure: Secondary | ICD-10-CM

## 2019-01-02 DIAGNOSIS — J449 Chronic obstructive pulmonary disease, unspecified: Secondary | ICD-10-CM | POA: Diagnosis not present

## 2019-01-15 DIAGNOSIS — M6281 Muscle weakness (generalized): Secondary | ICD-10-CM | POA: Diagnosis not present

## 2019-01-15 DIAGNOSIS — M545 Low back pain: Secondary | ICD-10-CM | POA: Diagnosis not present

## 2019-01-15 DIAGNOSIS — J9611 Chronic respiratory failure with hypoxia: Secondary | ICD-10-CM | POA: Diagnosis not present

## 2019-01-15 DIAGNOSIS — R269 Unspecified abnormalities of gait and mobility: Secondary | ICD-10-CM | POA: Diagnosis not present

## 2019-01-18 DIAGNOSIS — M6281 Muscle weakness (generalized): Secondary | ICD-10-CM | POA: Diagnosis not present

## 2019-01-18 DIAGNOSIS — M545 Low back pain: Secondary | ICD-10-CM | POA: Diagnosis not present

## 2019-01-18 DIAGNOSIS — R269 Unspecified abnormalities of gait and mobility: Secondary | ICD-10-CM | POA: Diagnosis not present

## 2019-01-18 DIAGNOSIS — J9611 Chronic respiratory failure with hypoxia: Secondary | ICD-10-CM | POA: Diagnosis not present

## 2019-01-24 DIAGNOSIS — J9611 Chronic respiratory failure with hypoxia: Secondary | ICD-10-CM | POA: Diagnosis not present

## 2019-01-24 DIAGNOSIS — M545 Low back pain: Secondary | ICD-10-CM | POA: Diagnosis not present

## 2019-01-24 DIAGNOSIS — M6281 Muscle weakness (generalized): Secondary | ICD-10-CM | POA: Diagnosis not present

## 2019-01-24 DIAGNOSIS — R269 Unspecified abnormalities of gait and mobility: Secondary | ICD-10-CM | POA: Diagnosis not present

## 2019-01-26 DIAGNOSIS — M6281 Muscle weakness (generalized): Secondary | ICD-10-CM | POA: Diagnosis not present

## 2019-01-26 DIAGNOSIS — J9611 Chronic respiratory failure with hypoxia: Secondary | ICD-10-CM | POA: Diagnosis not present

## 2019-01-26 DIAGNOSIS — R269 Unspecified abnormalities of gait and mobility: Secondary | ICD-10-CM | POA: Diagnosis not present

## 2019-01-26 DIAGNOSIS — M545 Low back pain: Secondary | ICD-10-CM | POA: Diagnosis not present

## 2019-01-29 DIAGNOSIS — M6281 Muscle weakness (generalized): Secondary | ICD-10-CM | POA: Diagnosis not present

## 2019-01-29 DIAGNOSIS — M545 Low back pain: Secondary | ICD-10-CM | POA: Diagnosis not present

## 2019-01-29 DIAGNOSIS — R269 Unspecified abnormalities of gait and mobility: Secondary | ICD-10-CM | POA: Diagnosis not present

## 2019-01-29 DIAGNOSIS — J9611 Chronic respiratory failure with hypoxia: Secondary | ICD-10-CM | POA: Diagnosis not present

## 2019-02-02 DIAGNOSIS — J9611 Chronic respiratory failure with hypoxia: Secondary | ICD-10-CM | POA: Diagnosis not present

## 2019-02-02 DIAGNOSIS — M545 Low back pain: Secondary | ICD-10-CM | POA: Diagnosis not present

## 2019-02-02 DIAGNOSIS — M6281 Muscle weakness (generalized): Secondary | ICD-10-CM | POA: Diagnosis not present

## 2019-02-02 DIAGNOSIS — R269 Unspecified abnormalities of gait and mobility: Secondary | ICD-10-CM | POA: Diagnosis not present

## 2019-02-02 DIAGNOSIS — J449 Chronic obstructive pulmonary disease, unspecified: Secondary | ICD-10-CM | POA: Diagnosis not present

## 2019-02-06 DIAGNOSIS — M6281 Muscle weakness (generalized): Secondary | ICD-10-CM | POA: Diagnosis not present

## 2019-02-06 DIAGNOSIS — M545 Low back pain: Secondary | ICD-10-CM | POA: Diagnosis not present

## 2019-02-06 DIAGNOSIS — R269 Unspecified abnormalities of gait and mobility: Secondary | ICD-10-CM | POA: Diagnosis not present

## 2019-02-06 DIAGNOSIS — J9611 Chronic respiratory failure with hypoxia: Secondary | ICD-10-CM | POA: Diagnosis not present

## 2019-02-09 DIAGNOSIS — J9611 Chronic respiratory failure with hypoxia: Secondary | ICD-10-CM | POA: Diagnosis not present

## 2019-02-09 DIAGNOSIS — M6281 Muscle weakness (generalized): Secondary | ICD-10-CM | POA: Diagnosis not present

## 2019-02-09 DIAGNOSIS — R269 Unspecified abnormalities of gait and mobility: Secondary | ICD-10-CM | POA: Diagnosis not present

## 2019-02-09 DIAGNOSIS — M545 Low back pain: Secondary | ICD-10-CM | POA: Diagnosis not present

## 2019-02-14 DIAGNOSIS — R269 Unspecified abnormalities of gait and mobility: Secondary | ICD-10-CM | POA: Diagnosis not present

## 2019-02-14 DIAGNOSIS — M545 Low back pain: Secondary | ICD-10-CM | POA: Diagnosis not present

## 2019-02-14 DIAGNOSIS — J9611 Chronic respiratory failure with hypoxia: Secondary | ICD-10-CM | POA: Diagnosis not present

## 2019-02-14 DIAGNOSIS — M6281 Muscle weakness (generalized): Secondary | ICD-10-CM | POA: Diagnosis not present

## 2019-02-16 DIAGNOSIS — M545 Low back pain: Secondary | ICD-10-CM | POA: Diagnosis not present

## 2019-02-16 DIAGNOSIS — J9611 Chronic respiratory failure with hypoxia: Secondary | ICD-10-CM | POA: Diagnosis not present

## 2019-02-16 DIAGNOSIS — R269 Unspecified abnormalities of gait and mobility: Secondary | ICD-10-CM | POA: Diagnosis not present

## 2019-02-16 DIAGNOSIS — M6281 Muscle weakness (generalized): Secondary | ICD-10-CM | POA: Diagnosis not present

## 2019-02-21 DIAGNOSIS — J9611 Chronic respiratory failure with hypoxia: Secondary | ICD-10-CM | POA: Diagnosis not present

## 2019-02-21 DIAGNOSIS — M6281 Muscle weakness (generalized): Secondary | ICD-10-CM | POA: Diagnosis not present

## 2019-02-21 DIAGNOSIS — M545 Low back pain: Secondary | ICD-10-CM | POA: Diagnosis not present

## 2019-02-21 DIAGNOSIS — R269 Unspecified abnormalities of gait and mobility: Secondary | ICD-10-CM | POA: Diagnosis not present

## 2019-02-28 DIAGNOSIS — R269 Unspecified abnormalities of gait and mobility: Secondary | ICD-10-CM | POA: Diagnosis not present

## 2019-02-28 DIAGNOSIS — M439 Deforming dorsopathy, unspecified: Secondary | ICD-10-CM | POA: Diagnosis not present

## 2019-02-28 DIAGNOSIS — I10 Essential (primary) hypertension: Secondary | ICD-10-CM | POA: Diagnosis not present

## 2019-02-28 DIAGNOSIS — M545 Low back pain: Secondary | ICD-10-CM | POA: Diagnosis not present

## 2019-02-28 DIAGNOSIS — J9611 Chronic respiratory failure with hypoxia: Secondary | ICD-10-CM | POA: Diagnosis not present

## 2019-02-28 DIAGNOSIS — G8929 Other chronic pain: Secondary | ICD-10-CM | POA: Diagnosis not present

## 2019-02-28 DIAGNOSIS — E119 Type 2 diabetes mellitus without complications: Secondary | ICD-10-CM | POA: Diagnosis not present

## 2019-02-28 DIAGNOSIS — M6281 Muscle weakness (generalized): Secondary | ICD-10-CM | POA: Diagnosis not present

## 2019-03-02 DIAGNOSIS — G8929 Other chronic pain: Secondary | ICD-10-CM | POA: Diagnosis not present

## 2019-03-02 DIAGNOSIS — E119 Type 2 diabetes mellitus without complications: Secondary | ICD-10-CM | POA: Diagnosis not present

## 2019-03-02 DIAGNOSIS — M439 Deforming dorsopathy, unspecified: Secondary | ICD-10-CM | POA: Diagnosis not present

## 2019-03-02 DIAGNOSIS — I10 Essential (primary) hypertension: Secondary | ICD-10-CM | POA: Diagnosis not present

## 2019-03-02 DIAGNOSIS — M545 Low back pain: Secondary | ICD-10-CM | POA: Diagnosis not present

## 2019-03-02 DIAGNOSIS — M6281 Muscle weakness (generalized): Secondary | ICD-10-CM | POA: Diagnosis not present

## 2019-03-02 DIAGNOSIS — R269 Unspecified abnormalities of gait and mobility: Secondary | ICD-10-CM | POA: Diagnosis not present

## 2019-03-02 DIAGNOSIS — J9611 Chronic respiratory failure with hypoxia: Secondary | ICD-10-CM | POA: Diagnosis not present

## 2019-03-04 DIAGNOSIS — J449 Chronic obstructive pulmonary disease, unspecified: Secondary | ICD-10-CM | POA: Diagnosis not present

## 2019-03-09 DIAGNOSIS — G8929 Other chronic pain: Secondary | ICD-10-CM | POA: Diagnosis not present

## 2019-03-09 DIAGNOSIS — M545 Low back pain: Secondary | ICD-10-CM | POA: Diagnosis not present

## 2019-03-09 DIAGNOSIS — J9611 Chronic respiratory failure with hypoxia: Secondary | ICD-10-CM | POA: Diagnosis not present

## 2019-03-09 DIAGNOSIS — M6281 Muscle weakness (generalized): Secondary | ICD-10-CM | POA: Diagnosis not present

## 2019-03-09 DIAGNOSIS — M439 Deforming dorsopathy, unspecified: Secondary | ICD-10-CM | POA: Diagnosis not present

## 2019-03-09 DIAGNOSIS — R269 Unspecified abnormalities of gait and mobility: Secondary | ICD-10-CM | POA: Diagnosis not present

## 2019-03-09 DIAGNOSIS — I10 Essential (primary) hypertension: Secondary | ICD-10-CM | POA: Diagnosis not present

## 2019-03-09 DIAGNOSIS — E119 Type 2 diabetes mellitus without complications: Secondary | ICD-10-CM | POA: Diagnosis not present

## 2019-03-14 DIAGNOSIS — R269 Unspecified abnormalities of gait and mobility: Secondary | ICD-10-CM | POA: Diagnosis not present

## 2019-03-14 DIAGNOSIS — E119 Type 2 diabetes mellitus without complications: Secondary | ICD-10-CM | POA: Diagnosis not present

## 2019-03-14 DIAGNOSIS — J9611 Chronic respiratory failure with hypoxia: Secondary | ICD-10-CM | POA: Diagnosis not present

## 2019-03-14 DIAGNOSIS — M6281 Muscle weakness (generalized): Secondary | ICD-10-CM | POA: Diagnosis not present

## 2019-03-14 DIAGNOSIS — M545 Low back pain: Secondary | ICD-10-CM | POA: Diagnosis not present

## 2019-03-14 DIAGNOSIS — M439 Deforming dorsopathy, unspecified: Secondary | ICD-10-CM | POA: Diagnosis not present

## 2019-03-14 DIAGNOSIS — G8929 Other chronic pain: Secondary | ICD-10-CM | POA: Diagnosis not present

## 2019-03-14 DIAGNOSIS — I10 Essential (primary) hypertension: Secondary | ICD-10-CM | POA: Diagnosis not present

## 2019-04-02 DIAGNOSIS — J449 Chronic obstructive pulmonary disease, unspecified: Secondary | ICD-10-CM | POA: Diagnosis not present

## 2019-05-03 DIAGNOSIS — J449 Chronic obstructive pulmonary disease, unspecified: Secondary | ICD-10-CM | POA: Diagnosis not present

## 2019-06-02 DIAGNOSIS — J449 Chronic obstructive pulmonary disease, unspecified: Secondary | ICD-10-CM | POA: Diagnosis not present

## 2019-07-03 DIAGNOSIS — J449 Chronic obstructive pulmonary disease, unspecified: Secondary | ICD-10-CM | POA: Diagnosis not present

## 2019-07-04 DIAGNOSIS — Z0001 Encounter for general adult medical examination with abnormal findings: Secondary | ICD-10-CM | POA: Diagnosis not present

## 2019-07-04 DIAGNOSIS — E538 Deficiency of other specified B group vitamins: Secondary | ICD-10-CM | POA: Diagnosis not present

## 2019-07-04 DIAGNOSIS — I10 Essential (primary) hypertension: Secondary | ICD-10-CM | POA: Diagnosis not present

## 2019-07-04 DIAGNOSIS — Z79899 Other long term (current) drug therapy: Secondary | ICD-10-CM | POA: Diagnosis not present

## 2019-07-04 DIAGNOSIS — R69 Illness, unspecified: Secondary | ICD-10-CM | POA: Diagnosis not present

## 2019-07-04 DIAGNOSIS — K219 Gastro-esophageal reflux disease without esophagitis: Secondary | ICD-10-CM | POA: Diagnosis not present

## 2019-07-04 DIAGNOSIS — E1142 Type 2 diabetes mellitus with diabetic polyneuropathy: Secondary | ICD-10-CM | POA: Diagnosis not present

## 2019-07-04 DIAGNOSIS — J439 Emphysema, unspecified: Secondary | ICD-10-CM | POA: Diagnosis not present

## 2019-07-04 DIAGNOSIS — E7800 Pure hypercholesterolemia, unspecified: Secondary | ICD-10-CM | POA: Diagnosis not present

## 2019-07-05 DIAGNOSIS — R69 Illness, unspecified: Secondary | ICD-10-CM | POA: Diagnosis not present

## 2019-07-27 DIAGNOSIS — Z79899 Other long term (current) drug therapy: Secondary | ICD-10-CM | POA: Diagnosis not present

## 2019-08-02 DIAGNOSIS — J449 Chronic obstructive pulmonary disease, unspecified: Secondary | ICD-10-CM | POA: Diagnosis not present

## 2019-09-02 DIAGNOSIS — J449 Chronic obstructive pulmonary disease, unspecified: Secondary | ICD-10-CM | POA: Diagnosis not present

## 2019-09-07 DIAGNOSIS — R69 Illness, unspecified: Secondary | ICD-10-CM | POA: Diagnosis not present

## 2019-09-22 ENCOUNTER — Encounter (HOSPITAL_COMMUNITY): Payer: Self-pay

## 2019-09-22 ENCOUNTER — Inpatient Hospital Stay (HOSPITAL_COMMUNITY)
Admission: EM | Admit: 2019-09-22 | Discharge: 2019-09-30 | DRG: 190 | Disposition: A | Discharge status: Skilled Nursing Facility | Payer: Medicare HMO | Attending: Internal Medicine | Admitting: Internal Medicine

## 2019-09-22 ENCOUNTER — Other Ambulatory Visit: Payer: Self-pay

## 2019-09-22 ENCOUNTER — Inpatient Hospital Stay (HOSPITAL_COMMUNITY): Payer: Medicare HMO

## 2019-09-22 ENCOUNTER — Emergency Department (HOSPITAL_COMMUNITY): Payer: Medicare HMO

## 2019-09-22 DIAGNOSIS — I480 Paroxysmal atrial fibrillation: Secondary | ICD-10-CM | POA: Diagnosis not present

## 2019-09-22 DIAGNOSIS — E785 Hyperlipidemia, unspecified: Secondary | ICD-10-CM | POA: Diagnosis present

## 2019-09-22 DIAGNOSIS — R0689 Other abnormalities of breathing: Secondary | ICD-10-CM | POA: Diagnosis not present

## 2019-09-22 DIAGNOSIS — D509 Iron deficiency anemia, unspecified: Secondary | ICD-10-CM | POA: Diagnosis present

## 2019-09-22 DIAGNOSIS — E871 Hypo-osmolality and hyponatremia: Secondary | ICD-10-CM | POA: Diagnosis present

## 2019-09-22 DIAGNOSIS — L89891 Pressure ulcer of other site, stage 1: Secondary | ICD-10-CM | POA: Diagnosis present

## 2019-09-22 DIAGNOSIS — I5032 Chronic diastolic (congestive) heart failure: Secondary | ICD-10-CM | POA: Diagnosis not present

## 2019-09-22 DIAGNOSIS — I4891 Unspecified atrial fibrillation: Secondary | ICD-10-CM | POA: Diagnosis not present

## 2019-09-22 DIAGNOSIS — I110 Hypertensive heart disease with heart failure: Secondary | ICD-10-CM | POA: Diagnosis present

## 2019-09-22 DIAGNOSIS — E1169 Type 2 diabetes mellitus with other specified complication: Secondary | ICD-10-CM | POA: Diagnosis present

## 2019-09-22 DIAGNOSIS — R0600 Dyspnea, unspecified: Secondary | ICD-10-CM | POA: Diagnosis not present

## 2019-09-22 DIAGNOSIS — N760 Acute vaginitis: Secondary | ICD-10-CM | POA: Diagnosis not present

## 2019-09-22 DIAGNOSIS — S8001XA Contusion of right knee, initial encounter: Secondary | ICD-10-CM | POA: Diagnosis present

## 2019-09-22 DIAGNOSIS — R0602 Shortness of breath: Secondary | ICD-10-CM

## 2019-09-22 DIAGNOSIS — I5023 Acute on chronic systolic (congestive) heart failure: Secondary | ICD-10-CM | POA: Diagnosis not present

## 2019-09-22 DIAGNOSIS — J9621 Acute and chronic respiratory failure with hypoxia: Secondary | ICD-10-CM | POA: Diagnosis not present

## 2019-09-22 DIAGNOSIS — K219 Gastro-esophageal reflux disease without esophagitis: Secondary | ICD-10-CM | POA: Diagnosis present

## 2019-09-22 DIAGNOSIS — Z8249 Family history of ischemic heart disease and other diseases of the circulatory system: Secondary | ICD-10-CM

## 2019-09-22 DIAGNOSIS — J441 Chronic obstructive pulmonary disease with (acute) exacerbation: Principal | ICD-10-CM | POA: Diagnosis present

## 2019-09-22 DIAGNOSIS — D638 Anemia in other chronic diseases classified elsewhere: Secondary | ICD-10-CM | POA: Diagnosis not present

## 2019-09-22 DIAGNOSIS — E119 Type 2 diabetes mellitus without complications: Secondary | ICD-10-CM | POA: Diagnosis present

## 2019-09-22 DIAGNOSIS — Z7982 Long term (current) use of aspirin: Secondary | ICD-10-CM

## 2019-09-22 DIAGNOSIS — S0083XA Contusion of other part of head, initial encounter: Secondary | ICD-10-CM | POA: Diagnosis present

## 2019-09-22 DIAGNOSIS — L8990 Pressure ulcer of unspecified site, unspecified stage: Secondary | ICD-10-CM | POA: Diagnosis present

## 2019-09-22 DIAGNOSIS — N762 Acute vulvitis: Secondary | ICD-10-CM | POA: Diagnosis present

## 2019-09-22 DIAGNOSIS — W19XXXA Unspecified fall, initial encounter: Secondary | ICD-10-CM | POA: Diagnosis present

## 2019-09-22 DIAGNOSIS — I10 Essential (primary) hypertension: Secondary | ICD-10-CM | POA: Diagnosis present

## 2019-09-22 DIAGNOSIS — Z7951 Long term (current) use of inhaled steroids: Secondary | ICD-10-CM

## 2019-09-22 DIAGNOSIS — S0990XA Unspecified injury of head, initial encounter: Secondary | ICD-10-CM | POA: Diagnosis not present

## 2019-09-22 DIAGNOSIS — Z7984 Long term (current) use of oral hypoglycemic drugs: Secondary | ICD-10-CM

## 2019-09-22 DIAGNOSIS — L89511 Pressure ulcer of right ankle, stage 1: Secondary | ICD-10-CM | POA: Diagnosis present

## 2019-09-22 DIAGNOSIS — J80 Acute respiratory distress syndrome: Secondary | ICD-10-CM | POA: Diagnosis not present

## 2019-09-22 DIAGNOSIS — R69 Illness, unspecified: Secondary | ICD-10-CM | POA: Diagnosis not present

## 2019-09-22 DIAGNOSIS — I5031 Acute diastolic (congestive) heart failure: Secondary | ICD-10-CM | POA: Diagnosis not present

## 2019-09-22 DIAGNOSIS — I509 Heart failure, unspecified: Secondary | ICD-10-CM | POA: Diagnosis not present

## 2019-09-22 DIAGNOSIS — Z79899 Other long term (current) drug therapy: Secondary | ICD-10-CM

## 2019-09-22 DIAGNOSIS — E118 Type 2 diabetes mellitus with unspecified complications: Secondary | ICD-10-CM | POA: Diagnosis not present

## 2019-09-22 DIAGNOSIS — Z9981 Dependence on supplemental oxygen: Secondary | ICD-10-CM

## 2019-09-22 DIAGNOSIS — J90 Pleural effusion, not elsewhere classified: Secondary | ICD-10-CM | POA: Diagnosis not present

## 2019-09-22 DIAGNOSIS — Z789 Other specified health status: Secondary | ICD-10-CM

## 2019-09-22 DIAGNOSIS — I5033 Acute on chronic diastolic (congestive) heart failure: Secondary | ICD-10-CM | POA: Diagnosis not present

## 2019-09-22 DIAGNOSIS — F1090 Alcohol use, unspecified, uncomplicated: Secondary | ICD-10-CM

## 2019-09-22 DIAGNOSIS — Z7289 Other problems related to lifestyle: Secondary | ICD-10-CM | POA: Diagnosis not present

## 2019-09-22 DIAGNOSIS — S0993XA Unspecified injury of face, initial encounter: Secondary | ICD-10-CM | POA: Diagnosis not present

## 2019-09-22 DIAGNOSIS — M7989 Other specified soft tissue disorders: Secondary | ICD-10-CM | POA: Diagnosis not present

## 2019-09-22 DIAGNOSIS — R0902 Hypoxemia: Secondary | ICD-10-CM | POA: Diagnosis not present

## 2019-09-22 DIAGNOSIS — L89621 Pressure ulcer of left heel, stage 1: Secondary | ICD-10-CM | POA: Diagnosis present

## 2019-09-22 DIAGNOSIS — Z20822 Contact with and (suspected) exposure to covid-19: Secondary | ICD-10-CM | POA: Diagnosis not present

## 2019-09-22 DIAGNOSIS — J9622 Acute and chronic respiratory failure with hypercapnia: Secondary | ICD-10-CM | POA: Diagnosis present

## 2019-09-22 DIAGNOSIS — F1010 Alcohol abuse, uncomplicated: Secondary | ICD-10-CM | POA: Diagnosis present

## 2019-09-22 DIAGNOSIS — F17210 Nicotine dependence, cigarettes, uncomplicated: Secondary | ICD-10-CM | POA: Diagnosis present

## 2019-09-22 DIAGNOSIS — Z9851 Tubal ligation status: Secondary | ICD-10-CM

## 2019-09-22 DIAGNOSIS — J9690 Respiratory failure, unspecified, unspecified whether with hypoxia or hypercapnia: Secondary | ICD-10-CM | POA: Diagnosis not present

## 2019-09-22 DIAGNOSIS — D508 Other iron deficiency anemias: Secondary | ICD-10-CM | POA: Diagnosis not present

## 2019-09-22 DIAGNOSIS — Z9119 Patient's noncompliance with other medical treatment and regimen: Secondary | ICD-10-CM

## 2019-09-22 DIAGNOSIS — D649 Anemia, unspecified: Secondary | ICD-10-CM | POA: Diagnosis present

## 2019-09-22 DIAGNOSIS — S8991XA Unspecified injury of right lower leg, initial encounter: Secondary | ICD-10-CM | POA: Diagnosis not present

## 2019-09-22 HISTORY — DX: Heart failure, unspecified: I50.9

## 2019-09-22 LAB — CBC WITH DIFFERENTIAL/PLATELET
Abs Immature Granulocytes: 0.05 10*3/uL (ref 0.00–0.07)
Basophils Absolute: 0 10*3/uL (ref 0.0–0.1)
Basophils Relative: 0 %
Eosinophils Absolute: 0 10*3/uL (ref 0.0–0.5)
Eosinophils Relative: 0 %
HCT: 29.4 % — ABNORMAL LOW (ref 36.0–46.0)
Hemoglobin: 9 g/dL — ABNORMAL LOW (ref 12.0–15.0)
Immature Granulocytes: 1 %
Lymphocytes Relative: 5 %
Lymphs Abs: 0.4 10*3/uL — ABNORMAL LOW (ref 0.7–4.0)
MCH: 25.4 pg — ABNORMAL LOW (ref 26.0–34.0)
MCHC: 30.6 g/dL (ref 30.0–36.0)
MCV: 82.8 fL (ref 80.0–100.0)
Monocytes Absolute: 0.2 10*3/uL (ref 0.1–1.0)
Monocytes Relative: 3 %
Neutro Abs: 7.3 10*3/uL (ref 1.7–7.7)
Neutrophils Relative %: 91 %
Platelets: 243 10*3/uL (ref 150–400)
RBC: 3.55 MIL/uL — ABNORMAL LOW (ref 3.87–5.11)
RDW: 14.6 % (ref 11.5–15.5)
WBC: 8 10*3/uL (ref 4.0–10.5)
nRBC: 0.5 % — ABNORMAL HIGH (ref 0.0–0.2)

## 2019-09-22 LAB — BRAIN NATRIURETIC PEPTIDE: B Natriuretic Peptide: 269.6 pg/mL — ABNORMAL HIGH (ref 0.0–100.0)

## 2019-09-22 LAB — I-STAT VENOUS BLOOD GAS, ED
Acid-Base Excess: 10 mmol/L — ABNORMAL HIGH (ref 0.0–2.0)
Bicarbonate: 39.1 mmol/L — ABNORMAL HIGH (ref 20.0–28.0)
Calcium, Ion: 1.11 mmol/L — ABNORMAL LOW (ref 1.15–1.40)
HCT: 33 % — ABNORMAL LOW (ref 36.0–46.0)
Hemoglobin: 11.2 g/dL — ABNORMAL LOW (ref 12.0–15.0)
O2 Saturation: 97 %
Potassium: 4.9 mmol/L (ref 3.5–5.1)
Sodium: 112 mmol/L — CL (ref 135–145)
TCO2: 41 mmol/L — ABNORMAL HIGH (ref 22–32)
pCO2, Ven: 79.8 mmHg (ref 44.0–60.0)
pH, Ven: 7.298 (ref 7.250–7.430)
pO2, Ven: 105 mmHg — ABNORMAL HIGH (ref 32.0–45.0)

## 2019-09-22 LAB — GLUCOSE, CAPILLARY: Glucose-Capillary: 126 mg/dL — ABNORMAL HIGH (ref 70–99)

## 2019-09-22 LAB — BLOOD GAS, VENOUS
Acid-Base Excess: 7.6 mmol/L — ABNORMAL HIGH (ref 0.0–2.0)
Bicarbonate: 34 mmol/L — ABNORMAL HIGH (ref 20.0–28.0)
Drawn by: 4653
FIO2: 50
O2 Saturation: 95.6 %
Patient temperature: 37
pCO2, Ven: 73.7 mmHg (ref 44.0–60.0)
pH, Ven: 7.287 (ref 7.250–7.430)
pO2, Ven: 88.7 mmHg — ABNORMAL HIGH (ref 32.0–45.0)

## 2019-09-22 LAB — BASIC METABOLIC PANEL
Anion gap: 12 (ref 5–15)
Anion gap: 11 (ref 5–15)
Anion gap: 12 (ref 5–15)
BUN: 10 mg/dL (ref 8–23)
BUN: 11 mg/dL (ref 8–23)
BUN: 11 mg/dL (ref 8–23)
CO2: 29 mmol/L (ref 22–32)
CO2: 33 mmol/L — ABNORMAL HIGH (ref 22–32)
CO2: 33 mmol/L — ABNORMAL HIGH (ref 22–32)
Calcium: 8.5 mg/dL — ABNORMAL LOW (ref 8.9–10.3)
Calcium: 8.4 mg/dL — ABNORMAL LOW (ref 8.9–10.3)
Calcium: 8.4 mg/dL — ABNORMAL LOW (ref 8.9–10.3)
Chloride: 72 mmol/L — ABNORMAL LOW (ref 98–111)
Chloride: 72 mmol/L — ABNORMAL LOW (ref 98–111)
Chloride: 73 mmol/L — ABNORMAL LOW (ref 98–111)
Creatinine, Ser: 0.55 mg/dL (ref 0.44–1.00)
Creatinine, Ser: 0.7 mg/dL (ref 0.44–1.00)
Creatinine, Ser: 0.74 mg/dL (ref 0.44–1.00)
GFR calc Af Amer: >60 mL/min (ref >60)
GFR calc Af Amer: >60 mL/min (ref >60)
GFR calc Af Amer: >60 mL/min (ref >60)
GFR calc non Af Amer: >60 mL/min (ref >60)
GFR calc non Af Amer: >60 mL/min (ref >60)
GFR calc non Af Amer: >60 mL/min (ref >60)
Glucose, Bld: 110 mg/dL — ABNORMAL HIGH (ref 70–99)
Glucose, Bld: 132 mg/dL — ABNORMAL HIGH (ref 70–99)
Glucose, Bld: 115 mg/dL — ABNORMAL HIGH (ref 70–99)
Potassium: 4.9 mmol/L (ref 3.5–5.1)
Potassium: 5.1 mmol/L (ref 3.5–5.1)
Potassium: 4.7 mmol/L (ref 3.5–5.1)
Sodium: 113 mmol/L — CL (ref 135–145)
Sodium: 116 mmol/L — CL (ref 135–145)
Sodium: 118 mmol/L — CL (ref 135–145)

## 2019-09-22 LAB — C-REACTIVE PROTEIN: CRP: <0.5 mg/dL (ref <1.0)

## 2019-09-22 LAB — ECHOCARDIOGRAM COMPLETE
Area-P 1/2: 4.6 cm2
S' Lateral: 3.8 cm

## 2019-09-22 LAB — SEDIMENTATION RATE: Sed Rate: 4 mm/hr (ref 0–22)

## 2019-09-22 LAB — SARS CORONAVIRUS 2 BY RT PCR (HOSPITAL ORDER, PERFORMED IN ~~LOC~~ HOSPITAL LAB): SARS Coronavirus 2: NEGATIVE

## 2019-09-22 MED ORDER — THIAMINE HCL 100 MG PO TABS
100.0000 mg | ORAL_TABLET | Freq: Every day | ORAL | Status: DC
  Administered 2019-09-23 – 2019-09-30 (×8): 100 mg via ORAL
  Filled 2019-09-22 (×8): qty 1

## 2019-09-22 MED ORDER — ENOXAPARIN SODIUM 40 MG/0.4ML ~~LOC~~ SOLN: 40.0000 mg | SUBCUTANEOUS | Status: DC

## 2019-09-22 MED ORDER — MAGNESIUM SULFATE 2 GM/50ML IV SOLN
2.0000 g | Freq: Once | INTRAVENOUS | Status: AC
  Administered 2019-09-22: 2 g via INTRAVENOUS
  Filled 2019-09-22: qty 50

## 2019-09-22 MED ORDER — FUROSEMIDE 10 MG/ML IJ SOLN
40.0000 mg | Freq: Two times a day (BID) | INTRAMUSCULAR | Status: DC
  Administered 2019-09-22 – 2019-09-28 (×12): 40 mg via INTRAVENOUS
  Filled 2019-09-22 (×12): qty 4

## 2019-09-22 MED ORDER — IPRATROPIUM-ALBUTEROL 0.5-2.5 (3) MG/3ML IN SOLN
3.0000 mL | RESPIRATORY_TRACT | Status: DC | PRN
  Administered 2019-09-27: 3 mL via RESPIRATORY_TRACT
  Filled 2019-09-22 (×2): qty 3

## 2019-09-22 MED ORDER — NICOTINE 21 MG/24HR TD PT24
21.0000 mg | MEDICATED_PATCH | Freq: Every day | TRANSDERMAL | Status: DC
  Administered 2019-09-22 – 2019-09-23 (×2): 21 mg via TRANSDERMAL
  Filled 2019-09-22 (×7): qty 1

## 2019-09-22 MED ORDER — ARFORMOTEROL TARTRATE 15 MCG/2ML IN NEBU
15.0000 ug | INHALATION_SOLUTION | Freq: Two times a day (BID) | RESPIRATORY_TRACT | Status: DC
  Administered 2019-09-22 – 2019-09-30 (×16): 15 ug via RESPIRATORY_TRACT
  Filled 2019-09-22 (×16): qty 2

## 2019-09-22 MED ORDER — SODIUM CHLORIDE 0.9% FLUSH
3.0000 mL | Freq: Two times a day (BID) | INTRAVENOUS | Status: DC
  Administered 2019-09-22 – 2019-09-29 (×15): 3 mL via INTRAVENOUS

## 2019-09-22 MED ORDER — ATORVASTATIN CALCIUM 10 MG PO TABS
20.0000 mg | ORAL_TABLET | Freq: Every evening | ORAL | Status: DC
  Administered 2019-09-23 – 2019-09-29 (×7): 20 mg via ORAL
  Filled 2019-09-22 (×7): qty 2

## 2019-09-22 MED ORDER — ADULT MULTIVITAMIN W/MINERALS CH
1.0000 | ORAL_TABLET | Freq: Every day | ORAL | Status: DC
  Administered 2019-09-23 – 2019-09-30 (×8): 1 via ORAL
  Filled 2019-09-22 (×8): qty 1

## 2019-09-22 MED ORDER — NYSTATIN 100000 UNIT/GM EX POWD
Freq: Two times a day (BID) | CUTANEOUS | Status: DC
  Administered 2019-09-26: 1 via TOPICAL
  Filled 2019-09-22 (×2): qty 15

## 2019-09-22 MED ORDER — BUDESONIDE 0.5 MG/2ML IN SUSP
0.5000 mg | Freq: Two times a day (BID) | RESPIRATORY_TRACT | Status: DC
  Administered 2019-09-22 – 2019-09-30 (×16): 0.5 mg via RESPIRATORY_TRACT
  Filled 2019-09-22 (×16): qty 2

## 2019-09-22 MED ORDER — IPRATROPIUM-ALBUTEROL 0.5-2.5 (3) MG/3ML IN SOLN
3.0000 mL | Freq: Four times a day (QID) | RESPIRATORY_TRACT | Status: DC
  Administered 2019-09-22 – 2019-09-27 (×18): 3 mL via RESPIRATORY_TRACT
  Filled 2019-09-22 (×18): qty 3

## 2019-09-22 MED ORDER — IPRATROPIUM BROMIDE HFA 17 MCG/ACT IN AERS
2.0000 | INHALATION_SPRAY | Freq: Once | RESPIRATORY_TRACT | Status: AC
  Administered 2019-09-22: 2 via RESPIRATORY_TRACT
  Filled 2019-09-22: qty 12.9

## 2019-09-22 MED ORDER — FOLIC ACID 1 MG PO TABS
1.0000 mg | ORAL_TABLET | Freq: Every day | ORAL | Status: DC
  Administered 2019-09-23 – 2019-09-30 (×8): 1 mg via ORAL
  Filled 2019-09-22 (×8): qty 1

## 2019-09-22 MED ORDER — ACETAMINOPHEN 325 MG PO TABS: 650.0000 mg | ORAL_TABLET | ORAL | Status: DC | PRN

## 2019-09-22 MED ORDER — FUROSEMIDE 10 MG/ML IJ SOLN
40.0000 mg | Freq: Once | INTRAMUSCULAR | Status: AC
  Administered 2019-09-22: 40 mg via INTRAVENOUS
  Filled 2019-09-22: qty 4

## 2019-09-22 MED ORDER — DIAZEPAM 5 MG PO TABS
5.0000 mg | ORAL_TABLET | Freq: Two times a day (BID) | ORAL | Status: DC | PRN
  Administered 2019-09-25 – 2019-09-29 (×5): 5 mg via ORAL
  Filled 2019-09-22 (×5): qty 1

## 2019-09-22 MED ORDER — SODIUM CHLORIDE 0.9 % IV SOLN
2.0000 g | INTRAVENOUS | Status: DC
  Administered 2019-09-22 – 2019-09-26 (×5): 2 g via INTRAVENOUS
  Filled 2019-09-22 (×2): qty 20
  Filled 2019-09-22 (×2): qty 2
  Filled 2019-09-22 (×2): qty 20

## 2019-09-22 MED ORDER — ALBUTEROL SULFATE HFA 108 (90 BASE) MCG/ACT IN AERS
8.0000 | INHALATION_SPRAY | Freq: Once | RESPIRATORY_TRACT | Status: AC
  Administered 2019-09-22: 8 via RESPIRATORY_TRACT
  Filled 2019-09-22: qty 6.7

## 2019-09-22 MED ORDER — ONDANSETRON HCL 4 MG/2ML IJ SOLN: 4.0000 mg | Freq: Four times a day (QID) | INTRAMUSCULAR | Status: DC | PRN

## 2019-09-22 MED ORDER — LORAZEPAM 1 MG PO TABS
1.0000 mg | ORAL_TABLET | ORAL | Status: AC | PRN
  Administered 2019-09-23 – 2019-09-24 (×3): 1 mg via ORAL
  Filled 2019-09-22 (×3): qty 1

## 2019-09-22 MED ORDER — ENOXAPARIN SODIUM 40 MG/0.4ML ~~LOC~~ SOLN
40.0000 mg | Freq: Every day | SUBCUTANEOUS | Status: DC
  Administered 2019-09-23 (×2): 40 mg via SUBCUTANEOUS
  Filled 2019-09-22 (×2): qty 0.4

## 2019-09-22 MED ORDER — PANTOPRAZOLE SODIUM 40 MG IV SOLR
40.0000 mg | INTRAVENOUS | Status: DC
  Administered 2019-09-22 – 2019-09-29 (×7): 40 mg via INTRAVENOUS
  Filled 2019-09-22 (×7): qty 40

## 2019-09-22 MED ORDER — INSULIN ASPART 100 UNIT/ML ~~LOC~~ SOLN
0.0000 [IU] | Freq: Four times a day (QID) | SUBCUTANEOUS | Status: DC
  Administered 2019-09-22: 1 [IU] via SUBCUTANEOUS
  Administered 2019-09-23: 3 [IU] via SUBCUTANEOUS
  Administered 2019-09-23: 2 [IU] via SUBCUTANEOUS
  Administered 2019-09-23: 1 [IU] via SUBCUTANEOUS
  Administered 2019-09-24 – 2019-09-25 (×5): 2 [IU] via SUBCUTANEOUS
  Administered 2019-09-25: 3 [IU] via SUBCUTANEOUS
  Administered 2019-09-25 – 2019-09-26 (×5): 2 [IU] via SUBCUTANEOUS
  Administered 2019-09-27: 1 [IU] via SUBCUTANEOUS
  Administered 2019-09-27 (×2): 2 [IU] via SUBCUTANEOUS
  Administered 2019-09-28: 3 [IU] via SUBCUTANEOUS
  Administered 2019-09-28 – 2019-09-29 (×4): 2 [IU] via SUBCUTANEOUS
  Administered 2019-09-29 (×2): 5 [IU] via SUBCUTANEOUS
  Administered 2019-09-30: 2 [IU] via SUBCUTANEOUS

## 2019-09-22 MED ORDER — METHYLPREDNISOLONE SODIUM SUCC 125 MG IJ SOLR
60.0000 mg | Freq: Two times a day (BID) | INTRAMUSCULAR | Status: DC
  Administered 2019-09-23 – 2019-09-28 (×12): 60 mg via INTRAVENOUS
  Filled 2019-09-22 (×13): qty 2

## 2019-09-22 MED ORDER — LORAZEPAM 2 MG/ML IJ SOLN: 1.0000 mg | INTRAMUSCULAR | Status: AC | PRN

## 2019-09-22 MED ORDER — SODIUM CHLORIDE 0.9% FLUSH: 3.0000 mL | INTRAVENOUS | Status: DC | PRN

## 2019-09-22 MED ORDER — SODIUM CHLORIDE 0.9 % IV SOLN: 250.0000 mL | INTRAVENOUS | Status: DC | PRN

## 2019-09-22 MED ORDER — THIAMINE HCL 100 MG/ML IJ SOLN
100.0000 mg | Freq: Every day | INTRAMUSCULAR | Status: DC
  Administered 2019-09-22: 100 mg via INTRAVENOUS
  Filled 2019-09-22: qty 2

## 2019-09-22 NOTE — ED Provider Notes (Signed)
[facility] EMERGENCY DEPARTMENT Provider Note   CSN: [id number] Arrival date & time: 09/22/19  3016     History Chief Complaint  Patient presents with  . Shortness of Breath  . COPD Exacerbation    [name] is a 74 y.o. female.  The history is provided by the patient, the EMS personnel and medical records. No language interpreter was used.  Shortness of Breath    74 year old female significant history of COPD on home oxygen at 4 L, diabetes, anemia, alcohol abuse brought here via EMS from home for evaluation of shortness of breath.  Patient has history of COPD and does wear oxygen at 4 L at baseline.  She activated her life alert today due to progressive worsening shortness of breath that she has been experiencing for the past month.  When EMS arrived, was satting at 84% on room air with decreased lung sounds on initial exam.  Patient was given 0.3 epi IM, 125 mg of Solu-Medrol, 5 mg of albuterol, 5 mg of Atrovent, 2 mg of magnesium, 5 mg of Zofran and saturation still improved to 98% on nonrebreather.  Her respiratory rate did improve as well.  History is limited as patient appears to be in moderate respiratory discomfort.  Patient admits she is having increased shortness of breath.  She does not complain of any significant chest pain or pain anywhere else.  She does not report fever chills runny nose sneezing or productive cough.  She denies any dysuria.  She has had her first Covid shot.  Patient was found to have bruising about her face and right leg.  She recall falling a month ago.  She has not been evaluated for fall.  She denies any significant facial pain headache or neck pain.  She lives at home by herself.  She reports she has been able to ambulate using a walker.  Past Medical History:  Diagnosis Date  . COPD (chronic obstructive pulmonary disease) ([place])   . Diabetes mellitus   . Diastolic dysfunction    history  . Hyperlipidemia   .  Hypertension     Patient Active Problem List   Diagnosis Date Noted  . Anemia 01/26/2018  . PVD (peripheral vascular disease) ([place]) 01/26/2018  . Ischemic ulcer diabetic foot ([place]) 01/26/2018  . Malnutrition of moderate degree 01/26/2018  . Critical limb ischemia with history of revascularization of same extremity 12/21/2017  . Hypomagnesemia 08/25/2017  . Bilateral back pain 08/24/2017  . Compression fracture of thoracic vertebra (H[facility]) 08/24/2017  . Hyponatremia 08/24/2017  . Hypokalemia 08/24/2017  . Fall 08/24/2017  . Microcytic anemia 08/24/2017  . Alcohol use 08/24/2017  . Depression 08/24/2017  . Dyslipidemia associated with type 2 diabetes mellitus ([place]) 04/28/2016  . Diabetes mellitus, type II, insulin dependent ([place]) 04/28/2016  . GERD without esophagitis 04/28/2016  . O2 dependent 04/28/2016  . Community acquired pneumonia   . Sepsis due to pneumonia ([place]) 04/18/2016  . COPD exacerbation ([place]) 12/04/2010  . Smoker 12/04/2010  . Essential hypertension 01/31/2008  . Chronic respiratory failure ([place]) 01/31/2008    Past Surgical History:  Procedure Laterality Date  . TUBAL LIGATION       OB History   No obstetric history on file.     Family History  Problem Relation Age of Onset  . Heart disease Father   . Lung cancer Father     Social History   Tobacco Use  . Smoking status: Current Every Day Smoker  Packs/day: 1.00    Years: 52.00    Pack years: 52.00  . Smokeless tobacco: Never Used  Substance Use Topics  . Alcohol use: Not on file  . Drug use: Not on file    Home Medications Prior to Admission medications   Medication Sig Start Date End Date Taking? Authorizing Provider  Amino Acids-Protein Hydrolys (FEEDING SUPPLEMENT, PRO-STAT SUGAR FREE 64,) LIQD Take 30 mLs by mouth 2 (two) times daily. 02/01/18   Ghimire, Henreitta Leber, MD  amLODipine-valsartan (EXFORGE) 5-160 MG tablet Take 1 tablet by mouth daily.    [provider]  aspirin EC  81 MG tablet Take 81 mg by mouth daily.    [provider]  atorvastatin (LIPITOR) 20 MG tablet Take 20 mg by mouth every evening.    [provider]  BYETTA 10 MCG PEN 10 MCG/0.04ML SOPN injection Inject 10 mcg into the skin 2 (two) times daily. 07/06/17   [provider]  diazepam (VALIUM) 5 MG tablet Take 1 tablet (5 mg total) by mouth every 12 (twelve) hours as needed for anxiety. 02/01/18   Ghimire, Henreitta Leber, MD  feeding supplement, ENSURE ENLIVE, (ENSURE ENLIVE) LIQD Take 237 mLs by mouth 2 (two) times daily between meals. 02/02/18   Ghimire, Henreitta Leber, MD  ferrous sulfate 325 (65 FE) MG tablet Take 1 tablet (325 mg total) by mouth 2 (two) times daily with a meal. 02/01/18   Ghimire, Henreitta Leber, MD  ipratropium (ATROVENT HFA) 17 MCG/ACT inhaler Inhale 2 puffs into the lungs every 6 (six) hours as needed for wheezing.    [provider]  ipratropium-albuterol (DUONEB) 0.5-2.5 (3) MG/3ML SOLN Take 3 mLs by nebulization every 4 (four) hours as needed. 02/01/18   Ghimire, Henreitta Leber, MD  methocarbamol (ROBAXIN) 500 MG tablet Take 1 tablet (500 mg total) by mouth every 8 (eight) hours as needed for muscle spasms. 08/27/17   Purohit, Konrad Dolores, MD  omeprazole (PRILOSEC) 20 MG capsule Take 1 tablet by mouth daily. 11/18/10   [provider]  PARoxetine (PAXIL) 30 MG tablet Take 30 mg by mouth daily.     [provider]  silver sulfADIAZINE (SILVADENE) 1 % cream Apply 1 application topically daily. 01/16/18   Trula Slade, DPM  sitaGLIPtan-metformin (JANUMET) 50-1000 MG per tablet Take 1 tablet by mouth 2 (two) times daily with a meal.      [provider]  SYMBICORT 160-4.5 MCG/ACT inhaler Inhale 2 puffs into the lungs 2 (two) times daily. 08/09/17   [provider]    Allergies    Patient has no known allergies.  Review of Systems   Review of Systems  Respiratory: Positive for shortness of breath.   All other systems reviewed and  are negative.   Physical Exam Updated Vital Signs BP (!) 162/53   Pulse 75   Temp 97.7 F (36.5 C) (Oral)   Resp 16   SpO2 100%   Physical Exam Vitals and nursing note reviewed.  Constitutional:      Appearance: She is well-developed.     Comments: Appears to be in mild respiratory discomfort on nonrebreather mask.  HENT:     Head:     Comments: Bruising noted to right side of forehead and face at different stage of healing minimal tenderness to palpation.  No limited eye range of motion.  No significant midface tenderness no septal hematoma or hemotympanum. Eyes:     Conjunctiva/sclera: Conjunctivae normal.  Cardiovascular:  Rate and Rhythm: Normal rate and regular rhythm.  Pulmonary:     Effort: Respiratory distress present. No accessory muscle usage.     Breath sounds: Decreased breath sounds and wheezing present. No rhonchi or rales.  Chest:     Chest wall: No tenderness.  Abdominal:     Palpations: Abdomen is soft.     Tenderness: There is no abdominal tenderness.  Musculoskeletal:     Cervical back: Normal range of motion and neck supple.     Comments: Right lower extremity: An area of induration and fluctuant noted to anterior tibial tuberosity suggestive of bursitis.  It is not significantly tender to palpation.  Ecchymosis extending down to the right lower extremity and most noticeable to right ankle with diminished pedal pulse but palpable.  No obvious deformity noted.  Patient able to flex the knee.  Neurological:     Mental Status: She is alert and oriented to person, place, and time.  Psychiatric:        Mood and Affect: Mood normal.     ED Results / Procedures / Treatments   Labs (all labs ordered are listed, but only abnormal results are displayed) Labs Reviewed  BASIC METABOLIC PANEL - Abnormal; Notable for the following components:      Result Value   Sodium 113 (*)    Chloride 72 (*)    Glucose, Bld 110 (*)    Calcium 8.5 (*)    All other  components within normal limits  CBC WITH DIFFERENTIAL/PLATELET - Abnormal; Notable for the following components:   RBC 3.55 (*)    Hemoglobin 9.0 (*)    HCT 29.4 (*)    MCH 25.4 (*)    nRBC 0.5 (*)    Lymphs Abs 0.4 (*)    All other components within normal limits  BRAIN NATRIURETIC PEPTIDE - Abnormal; Notable for the following components:   B Natriuretic Peptide 269.6 (*)    All other components within normal limits  I-STAT VENOUS BLOOD GAS, ED - Abnormal; Notable for the following components:   pCO2, Ven 79.8 (*)    pO2, Ven 105.0 (*)    Bicarbonate 39.1 (*)    TCO2 41 (*)    Acid-Base Excess 10.0 (*)    Sodium 112 (*)    Calcium, Ion 1.11 (*)    HCT 33.0 (*)    Hemoglobin 11.2 (*)    All other components within normal limits  SARS CORONAVIRUS 2 BY RT PCR [facility] ORDER, [place] LAB)  BLOOD GAS, VENOUS    EKG EKG Interpretation  Date/Time:  Saturday September 22 2019 07:52:25 EDT Ventricular Rate:  74 PR Interval:    QRS Duration: 94 QT Interval:  393 QTC Calculation: 436 R Axis:   45 Text Interpretation: Sinus rhythm Anterior infarct, old Confirmed by Lennice Sites [phone]) on 09/22/2019 8:32:57 AM   Radiology DG Ankle Complete Right  Result Date: 09/22/2019 CLINICAL DATA:  Status post fall.  Bruising to right ankle in knee. EXAM: RIGHT ANKLE - COMPLETE 3+ VIEW COMPARISON:  None. FINDINGS: Mild medial soft tissue swelling. No underlying fracture or dislocation identified. No radio-opaque foreign body or soft tissue calcifications. IMPRESSION: Medial soft tissue swelling. Electronically Signed   By: Kerby Moors M.D.   On: 09/22/2019 09:52   CT Head Wo Contrast  Result Date: 09/22/2019 CLINICAL DATA:  Facial trauma. EXAM: CT HEAD WITHOUT CONTRAST CT MAXILLOFACIAL WITHOUT CONTRAST TECHNIQUE: Multidetector CT imaging of the head and maxillofacial structures were  performed using the standard protocol without intravenous contrast. Multiplanar  CT image reconstructions of the maxillofacial structures were also generated. COMPARISON:  None. FINDINGS: CT HEAD FINDINGS Brain: No evidence of acute infarction, hemorrhage, hydrocephalus, extra-axial collection or mass lesion/mass effect. Large, extra-axial CSF density structure within the right posterior cranial fossa is identified measuring 6.3 x 3.1 cm. Findings compatible with a arachnoid cyst. Vascular: No hyperdense vessel or unexpected calcification. Skull: Normal. Negative for fracture or focal lesion. Other: None. CT MAXILLOFACIAL FINDINGS Osseous: No fracture or mandibular dislocation. No destructive process. Orbits: Negative. No traumatic or inflammatory finding. Sinuses: The paranasal sinuses are clear. Soft tissues: Negative. IMPRESSION: 1. No acute intracranial abnormalities. 2. Large right posterior cranial fossa arachnoid cyst. 3. No evidence for facial bone fracture. Electronically Signed   By: Kerby Moors M.D.   On: 09/22/2019 10:15   DG Chest Port 1 View  Result Date: 09/22/2019 CLINICAL DATA:  Shortness of breath. EXAM: PORTABLE CHEST 1 VIEW COMPARISON:  01/26/2018 FINDINGS: Stable cardiomediastinal contour. Aortic atherosclerosis. Bilateral pleural effusions identified, right greater than left. Mild interstitial edema. No airspace opacities. IMPRESSION: 1. Bilateral pleural effusions and mild interstitial edema compatible with CHF. 2.  Aortic Atherosclerosis (ICD10-I70.0). Electronically Signed   By: Kerby Moors M.D.   On: 09/22/2019 09:08   DG Knee Complete 4 Views Right  Result Date: 09/22/2019 CLINICAL DATA:  Fall. EXAM: RIGHT KNEE - COMPLETE 4+ VIEW COMPARISON:  None FINDINGS: No joint effusion. There is focal soft tissue thickening of the overlying the mid and distal patellar tendon which measures up to 1.8 cm in thickness. Sharpening the tibial spines noted. No fracture or dislocation. No radio-opaque foreign body or soft tissue calcifications. IMPRESSION: 1. No acute bone  abnormality. 2. Focal thickening overlying the mid and distal patellar tendon concerning for underlying hematoma. 3. Mild osteoarthritis. Electronically Signed   By: Kerby Moors M.D.   On: 09/22/2019 09:47   CT Maxillofacial Wo Contrast  Result Date: 09/22/2019 CLINICAL DATA:  Facial trauma. EXAM: CT HEAD WITHOUT CONTRAST CT MAXILLOFACIAL WITHOUT CONTRAST TECHNIQUE: Multidetector CT imaging of the head and maxillofacial structures were performed using the standard protocol without intravenous contrast. Multiplanar CT image reconstructions of the maxillofacial structures were also generated. COMPARISON:  None. FINDINGS: CT HEAD FINDINGS Brain: No evidence of acute infarction, hemorrhage, hydrocephalus, extra-axial collection or mass lesion/mass effect. Large, extra-axial CSF density structure within the right posterior cranial fossa is identified measuring 6.3 x 3.1 cm. Findings compatible with a arachnoid cyst. Vascular: No hyperdense vessel or unexpected calcification. Skull: Normal. Negative for fracture or focal lesion. Other: None. CT MAXILLOFACIAL FINDINGS Osseous: No fracture or mandibular dislocation. No destructive process. Orbits: Negative. No traumatic or inflammatory finding. Sinuses: The paranasal sinuses are clear. Soft tissues: Negative. IMPRESSION: 1. No acute intracranial abnormalities. 2. Large right posterior cranial fossa arachnoid cyst. 3. No evidence for facial bone fracture. Electronically Signed   By: Kerby Moors M.D.   On: 09/22/2019 10:15    Procedures .Critical Care Performed by: Domenic Moras, PA-C Authorized by: Domenic Moras, PA-C   Critical care provider statement:    Critical care time (minutes):  45   Critical care was time spent personally by me on the following activities:  Discussions with consultants, evaluation of patient's response to treatment, examination of patient, ordering and performing treatments and interventions, ordering and review of laboratory  studies, ordering and review of radiographic studies, pulse oximetry, re-evaluation of patient's condition, obtaining history from patient or surrogate and review of  old charts   (including critical care time)  Medications Ordered in ED Medications  magnesium sulfate IVPB 2 g 50 mL (has no administration in time range)  ipratropium (ATROVENT HFA) inhaler 2 puff (has no administration in time range)  albuterol (VENTOLIN HFA) 108 (90 Base) MCG/ACT inhaler 8 puff (8 puffs Inhalation Given 09/22/19 0949)  furosemide (LASIX) injection 40 mg (40 mg Intravenous Given 09/22/19 8338)    ED Course  I have reviewed the triage vital signs and the nursing notes.  Pertinent labs & imaging results that were available during my care of the patient were reviewed by me and considered in my medical decision making (see chart for details).    MDM Rules/Calculators/A&P                          BP (!) 149/45   Pulse 75   Temp 97.7 F (36.5 C) (Oral)   Resp 14   SpO2 99%   Final Clinical Impression(s) / ED Diagnoses Final diagnoses:  COPD exacerbation (HCC)  Acute congestive heart failure, unspecified heart failure type ([place])  Hyponatremia    Rx / DC Orders ED Discharge Orders    None     8:49 AM Patient here with progressive worsening shortness of breath ongoing for the past month.  She was found to be hypoxic at 84% on room air when EMS arrived but his symptoms did improve after she received epinephrine and Solu-Medrol magnesium albuterol Atrovent and was placed on a nonrebreather.  We have removed a nonrebreather in place patient on 4 L of oxygen and she is satting well at 97%.  She has bruising to the right side of her face and her right leg likely from a fall a month ago.  She has not been evaluated for this.  Will obtain appropriate imaging including head CT scan, maxillofacial CT, x-ray of her right knee and right ankle.  She has swelling noted to the right tibial tuberosity in which I suspect  this is likely either a bursitis or a hematoma and less likely to be an abscess as it is minimally tender.  Work-up initiated.  Care discussed with Dr. Ronnald Nian.  Screening Covid test ordered.  9:22 AM I had an opportunity to talk to patient's brother over the phone.  He is the power of attorney.  He did report the patient has significant history of tobacco abuse which contributes to her shortness of breath.  Patient also fell approximately a week ago.  She also has history of anemia in which she was concerned about.  She also had her medication changed by her doctor several weeks prior which includes decrease her diabetes medication and she was placed on a beta-blocker for her blood pressure.  9:42 AM Chest x-ray shows bilateral pleural effusions and mild interstitial edema compatible with CHF.  Patient does have trace edema to her lower extremity more significant in her left leg.  Her sodium is low at 113.  We will give 40 mg of Lasix. No documented hx of CHF.  Suspect hyponatremia may be 2/2 fluid overload.  No active CP to suggest ACS.    10:49 AM Will transition patient over to 4 L of oxygen and she maintains oxygen status at 98%.  On reexamination, lung is still tight with wheezing.  Will provide additional magnesium and Atrovent.  Patient was also given additional albuterol treatment.  Her pH is 7.29 with a PCO2 of 79.8 and a  bicarb of 39.1.  Her BNP is 269.  COVID-19 test is currently pending.  Her hemoglobin is stable at 9.  Imaging of her head, maxillofacial, and x-ray of her right knee and right ankle with no acute fracture or dislocation or intracranial bleed.  11:13 AM I have consulted [facility] and appreciate consultation from Dr. Tamala Julian who agrees to see and admit pt for further care.  COVID-19 test is currently pending.  FERRIN LIEBIG was evaluated in Emergency Department on 09/22/2019 for the symptoms described in the history of present illness. She was evaluated in the  context of the global COVID-19 pandemic, which necessitated consideration that the patient might be at risk for infection with the SARS-CoV-2 virus that causes COVID-19. Institutional protocols and algorithms that pertain to the evaluation of patients at risk for COVID-19 are in a state of rapid change based on information released by regulatory bodies including the CDC and federal and state organizations. These policies and algorithms were followed during the patient's care in the ED.    Domenic Moras, PA-C 09/22/19 1116    [place], [place], DO 09/22/19 1456

## 2019-09-22 NOTE — ED Provider Notes (Signed)
Medical screening examination/treatment/procedure(s) were conducted as a shared visit with non-physician practitioner(s) and myself.  I personally evaluated the patient during the encounter. Briefly, the patient is a 74 y.o. female with history of COPD on 4 L of oxygen, diabetes, high cholesterol who presents to the ED with shortness of breath.  Placed on nonrebreather as patient was hypoxic with EMS.  Was given epinephrine, steroids, magnesium, duo nebs as patient was wheezing and appears to have likely a COPD exacerbation.  She does have some swelling in her legs and could be fluid related as well.  She denies any chest pain.  EKG shows sinus rhythm.  She has had 1 of 2 of her Covid vaccinations.  She denies any fever.  States that she has been feeling short of breath for the last several weeks but got worse recently.  Had a fall about a month ago and has bruising to her right side of her face, right knee, right ankle all appear to be old.  She has been ambulatory with her walker since then but never got evaluated for this.  She has what appears to be like a hematoma over her anterior shin that is also old.  No tenderness in this area.  Lower suspicion that this is an abscess and overall likely a traumatic hematoma.  She is not on blood thinners.  Patient on 5 L nasal cannula of oxygen.  Has scattered wheezing and diminished air movement throughout.  Will continue breathing treatments will get lab work including Covid testing and chest x-ray.  Anticipate admission for COPD exacerbation.  Will also evaluate for heart failure, ACS.  This chart was dictated using voice recognition software.  Despite best efforts to proofread,  errors can occur which can change the documentation meaning.   [name] was evaluated in Emergency Department on 09/22/2019 for the symptoms described in the history of present illness. She was evaluated in the context of the global COVID-19 pandemic, which necessitated  consideration that the patient might be at risk for infection with the SARS-CoV-2 virus that causes COVID-19. Institutional protocols and algorithms that pertain to the evaluation of patients at risk for COVID-19 are in a state of rapid change based on information released by regulatory bodies including the CDC and federal and state organizations. These policies and algorithms were followed during the patient's care in the ED.    EKG Interpretation  Date/Time:  Saturday September 22 2019 07:52:25 EDT Ventricular Rate:  74 PR Interval:    QRS Duration: 94 QT Interval:  393 QTC Calculation: 436 R Axis:   45 Text Interpretation: Sinus rhythm Anterior infarct, old Confirmed by Lennice Sites [id number]) on 09/22/2019 8:32:57 AM           Lennice Sites, DO 09/22/19 0845

## 2019-09-22 NOTE — Progress Notes (Signed)
Patient transported from ED to room 6E06 on bipap without complication.

## 2019-09-22 NOTE — ED Notes (Addendum)
Pt was logged rolled & cleaned, peri-care performed by this RN & a tech. Skin breakdown was noted on her outer labia after dried stool was cleans/removed from her bottom. Barrier cream applied, pt repositioned for comfort, cardiac monitor re-applied. Pt is much more verbal less dyspneic at this time. She is wearing 4L n/c & sating 98% at this time. She did well when doing her ordered inhaler independently.

## 2019-09-22 NOTE — ED Notes (Signed)
EDP made aware of pt's critical Sodium of 113.

## 2019-09-22 NOTE — Progress Notes (Signed)
  Echocardiogram 2D Echocardiogram has been performed.  [name] 09/22/2019, 1:57 PM

## 2019-09-22 NOTE — H&P (Addendum)
History and Physical    [name]:[id number] DOB: 02/22/1945 DOA: 09/22/2019  Referring MD/NP/PA: Doy Hutching PCP: Lujean Amel, MD  Patient coming from: Home via EMS  Chief Complaint: Shortness of breath  I have personally briefly reviewed patient's old medical records in [place]   HPI: [name] is a 74 y.o. female with medical history significant of hypertension, hyperlipidemia, diastolic dysfunction, COPD on 4 L of oxygen, diabetes mellitus type 2, tobacco abuse, alcohol abuse, and anemia who presented with complaints of shortness of breath.  History is somewhat limited due to patient's current clinical condition.  She reports that for the last year her breathing has not been great.  It appears symptoms worsened over the last month.  Patient normally lives alone and had to use her life alert in order to call for help today.  She had recently fallen sustaining bruises to the right side of her face, shoulder, and legs.  Denies having any complaints of chest pain, cough, nausea, vomiting, or recent sick contacts.  Upon EMS arrival patient was noted to be satting around 84% on room air with decreased lung sounds.  She was given 0.3 mg epinephrine IM, 125 mg of Solu-Medrol, albuterol, Atrovent, 2 g of magnesium sulfate, and Zofran.  She was placed on a nonrebreather with improvement of O2 saturations to 98%.    After talks with the patient's brother over the phone more history is obtained.  He is reportedly the patient's power of attorney and helps set up her medications.  At baseline the patient utilizes a walker to get around the house.  She reportedly only uses the oxygen intermittently and 24/7 as she should.  The patient had fallen about a week ago and called EMS to help her get up.  This is the reason why she had multiple areas of bruising on the right side.  She reportedly continues to smoke on a daily basis and reportedly drinks less than a pint of alcohol  per day.  The brother sets out her medications, cigarette allotment, and alcohol allotment daily.  He notes that patient comes into the hospital once every year or so due to worsening of her breathing.     ED Course: Upon admission into the emergency room patient was seen to be afebrile, pulse 53-91, respirations 14-19, blood pressures maintained, and O2 sat as low as 84% with improvement currently on 4 L of nasal cannula oxygen.  Labs significant for hemoglobin 9, sodium 113, chloride 72, and BNP 269.6.  Venous pH 7.298, PCO2 79.8, PO2 105.0.  COVID-19 screening was negative.  Chest x-ray significant for bilateral pleural effusion with mild interstitial edema compatible with a CHF exacerbation.  X-rays of the right knee noted hematoma over the right knee and mild osteoarthritis without any acute fractures.  Patient had been given 40 mg of Lasix IV.  TRH called to admit.  Review of Systems  Unable to perform ROS: Medical condition  Respiratory: Positive for shortness of breath.   Musculoskeletal: Positive for falls.  Skin: Positive for rash.  Psychiatric/Behavioral: Positive for memory loss and substance abuse.    Past Medical History:  Diagnosis Date  . COPD (chronic obstructive pulmonary disease) ([place])   . Diabetes mellitus   . Diastolic dysfunction    history  . Hyperlipidemia   . Hypertension     Past Surgical History:  Procedure Laterality Date  . TUBAL LIGATION       reports that she has been smoking.  She has a 52.00 pack-year smoking history. She has never used smokeless tobacco. No history on file for alcohol use and drug use.  No Known Allergies  Family History  Problem Relation Age of Onset  . Heart disease Father   . Lung cancer Father     Prior to Admission medications   Medication Sig Start Date End Date Taking? Authorizing Provider  Amino Acids-Protein Hydrolys (FEEDING SUPPLEMENT, PRO-STAT SUGAR FREE 64,) LIQD Take 30 mLs by mouth 2 (two) times daily. 02/01/18    Ghimire, Henreitta Leber, MD  amLODipine-valsartan (EXFORGE) 5-160 MG tablet Take 1 tablet by mouth daily.    [provider]  aspirin EC 81 MG tablet Take 81 mg by mouth daily.    [provider]  atorvastatin (LIPITOR) 20 MG tablet Take 20 mg by mouth every evening.    [provider]  BYETTA 10 MCG PEN 10 MCG/0.04ML SOPN injection Inject 10 mcg into the skin 2 (two) times daily. 07/06/17   [provider]  diazepam (VALIUM) 5 MG tablet Take 1 tablet (5 mg total) by mouth every 12 (twelve) hours as needed for anxiety. 02/01/18   Ghimire, Henreitta Leber, MD  feeding supplement, ENSURE ENLIVE, (ENSURE ENLIVE) LIQD Take 237 mLs by mouth 2 (two) times daily between meals. 02/02/18   Ghimire, Henreitta Leber, MD  ferrous sulfate 325 (65 FE) MG tablet Take 1 tablet (325 mg total) by mouth 2 (two) times daily with a meal. 02/01/18   Ghimire, Henreitta Leber, MD  ipratropium (ATROVENT HFA) 17 MCG/ACT inhaler Inhale 2 puffs into the lungs every 6 (six) hours as needed for wheezing.    [provider]  ipratropium-albuterol (DUONEB) 0.5-2.5 (3) MG/3ML SOLN Take 3 mLs by nebulization every 4 (four) hours as needed. 02/01/18   Ghimire, Henreitta Leber, MD  methocarbamol (ROBAXIN) 500 MG tablet Take 1 tablet (500 mg total) by mouth every 8 (eight) hours as needed for muscle spasms. 08/27/17   Purohit, Konrad Dolores, MD  omeprazole (PRILOSEC) 20 MG capsule Take 1 tablet by mouth daily. 11/18/10   [provider]  PARoxetine (PAXIL) 30 MG tablet Take 30 mg by mouth daily.     [provider]  silver sulfADIAZINE (SILVADENE) 1 % cream Apply 1 application topically daily. 01/16/18   Trula Slade, DPM  sitaGLIPtan-metformin (JANUMET) 50-1000 MG per tablet Take 1 tablet by mouth 2 (two) times daily with a meal.      [provider]  SYMBICORT 160-4.5 MCG/ACT inhaler Inhale 2 puffs into the lungs 2 (two) times daily. 08/09/17   [provider]    Physical  Exam:  Constitutional: [id number] elderly female who appears to be lethargic, but easily arousable Vitals:   09/22/19 0800 09/22/19 0812 09/22/19 0957 09/22/19 1000  BP: (!) 162/53  (!) 130/51 (!) 149/45  Pulse: 75  (!) 53 75  Resp: 16  18 14   Temp:  97.7 F (36.5 C)    TempSrc:  Oral    SpO2: 100%  99% 99%   Eyes: PERRL, lids and conjunctivae normal ENMT: Mucous membranes are dry. Posterior pharynx clear of any exudate or lesions.   Neck: normal, supple, no masses, no thyromegaly Respiratory: Decreased overall aeration.  No significant wheezes appreciated at this time.  Patient currently on BiPAP. Cardiovascular: Regular rate and rhythm, no murmurs / rubs / gallops. No extremity edema. 1+ pedal pulses. No carotid bruits.  Abdomen: no tenderness, no masses palpated. No hepatosplenomegaly. Bowel sounds positive.  Musculoskeletal: no  clubbing / cyanosis.  Decreased movement of the hematoma appreciated over right knee Skin: Bruising noted of the right side of face, right shoulder, and right knee.  Erythema noted of the inguinal region and labia with signs of a wound. neurologic: CN 2-12 grossly intact. Sensation intact, DTR normal. Strength 5/5 in all 4.  Psychiatric: Normal judgment and insight.  Lethargic, but oriented x3    Labs on Admission: I have personally reviewed following labs and imaging studies  CBC: Recent Labs  Lab 09/22/19 0900 09/22/19 1007  WBC 8.0  --   NEUTROABS 7.3  --   HGB 9.0* 11.2*  HCT 29.4* 33.0*  MCV 82.8  --   PLT 243  --    Basic Metabolic Panel: Recent Labs  Lab 09/22/19 0900 09/22/19 1007  NA 113* 112*  K 4.9 4.9  CL 72*  --   CO2 29  --   GLUCOSE 110*  --   BUN 10  --   CREATININE 0.55  --   CALCIUM 8.5*  --    GFR: CrCl cannot be calculated (Unknown ideal weight.). Liver Function Tests: No results for input(s): AST, ALT, ALKPHOS, BILITOT, PROT, ALBUMIN in the last 168 hours. No results for input(s): LIPASE, AMYLASE in the last  168 hours. No results for input(s): AMMONIA in the last 168 hours. Coagulation Profile: No results for input(s): INR, PROTIME in the last 168 hours. Cardiac Enzymes: No results for input(s): CKTOTAL, CKMB, CKMBINDEX, TROPONINI in the last 168 hours. BNP (last 3 results) No results for input(s): PROBNP in the last 8760 hours. HbA1C: No results for input(s): HGBA1C in the last 72 hours. CBG: No results for input(s): GLUCAP in the last 168 hours. Lipid Profile: No results for input(s): CHOL, HDL, LDLCALC, TRIG, CHOLHDL, LDLDIRECT in the last 72 hours. Thyroid Function Tests: No results for input(s): TSH, T4TOTAL, FREET4, T3FREE, THYROIDAB in the last 72 hours. Anemia Panel: No results for input(s): VITAMINB12, FOLATE, FERRITIN, TIBC, IRON, RETICCTPCT in the last 72 hours. Urine analysis:    Component Value Date/Time   COLORURINE YELLOW 08/24/2017 2000   APPEARANCEUR CLEAR 08/24/2017 2000   LABSPEC 1.018 08/24/2017 2000   PHURINE 5.0 08/24/2017 2000   GLUCOSEU NEGATIVE 08/24/2017 2000   HGBUR NEGATIVE 08/24/2017 2000   [place] NEGATIVE 08/24/2017 2000   KETONESUR 5 (A) 08/24/2017 2000   PROTEINUR NEGATIVE 08/24/2017 2000   NITRITE NEGATIVE 08/24/2017 2000   LEUKOCYTESUR NEGATIVE 08/24/2017 2000   Sepsis Labs: Recent Results (from the past 240 hour(s))  SARS Coronavirus 2 by RT PCR (hospital order, performed in [facility] hospital lab) Nasopharyngeal Nasopharyngeal Swab     Status: None   Collection Time: 09/22/19  8:46 AM   Specimen: Nasopharyngeal Swab  Result Value Ref Range Status   SARS Coronavirus 2 NEGATIVE NEGATIVE Final    Comment: (NOTE) SARS-CoV-2 target nucleic acids are NOT DETECTED.  The SARS-CoV-2 RNA is generally detectable in upper and lower respiratory specimens during the acute phase of infection. The lowest concentration of SARS-CoV-2 viral copies this assay can detect is 250 copies / mL. A negative result does not preclude SARS-CoV-2 infection and  should not be used as the sole basis for treatment or other patient management decisions.  A negative result may occur with improper specimen collection / handling, submission of specimen other than nasopharyngeal swab, presence of viral mutation(s) within the areas targeted by this assay, and inadequate number of viral copies (<250 copies / mL). A negative result must be combined  with clinical observations, patient history, and epidemiological information.  Fact Sheet for Patients:   StrictlyIdeas.no  Fact Sheet for Healthcare Providers: BankingDealers.co.za  This test is not yet approved or  cleared by the [place] FDA and has been authorized for detection and/or diagnosis of SARS-CoV-2 by FDA under an Emergency Use Authorization (EUA).  This EUA will remain in effect (meaning this test can be used) for the duration of the COVID-19 declaration under Section 564(b)(1) of the Act, 21 U.S.C. section 360bbb-3(b)(1), unless the authorization is terminated or revoked sooner.  Performed at [facility], [place] [address] [place], [place] [address]      Radiological Exams on Admission: DG Ankle Complete Right  Result Date: 09/22/2019 CLINICAL DATA:  Status post fall.  Bruising to right ankle in knee. EXAM: RIGHT ANKLE - COMPLETE 3+ VIEW COMPARISON:  None. FINDINGS: Mild medial soft tissue swelling. No underlying fracture or dislocation identified. No radio-opaque foreign body or soft tissue calcifications. IMPRESSION: Medial soft tissue swelling. Electronically Signed   By: Kerby Moors M.D.   On: 09/22/2019 09:52   CT Head Wo Contrast  Result Date: 09/22/2019 CLINICAL DATA:  Facial trauma. EXAM: CT HEAD WITHOUT CONTRAST CT MAXILLOFACIAL WITHOUT CONTRAST TECHNIQUE: Multidetector CT imaging of the head and maxillofacial structures were performed using the standard protocol without intravenous contrast. Multiplanar CT image  reconstructions of the maxillofacial structures were also generated. COMPARISON:  None. FINDINGS: CT HEAD FINDINGS Brain: No evidence of acute infarction, hemorrhage, hydrocephalus, extra-axial collection or mass lesion/mass effect. Large, extra-axial CSF density structure within the right posterior cranial fossa is identified measuring 6.3 x 3.1 cm. Findings compatible with a arachnoid cyst. Vascular: No hyperdense vessel or unexpected calcification. Skull: Normal. Negative for fracture or focal lesion. Other: None. CT MAXILLOFACIAL FINDINGS Osseous: No fracture or mandibular dislocation. No destructive process. Orbits: Negative. No traumatic or inflammatory finding. Sinuses: The paranasal sinuses are clear. Soft tissues: Negative. IMPRESSION: 1. No acute intracranial abnormalities. 2. Large right posterior cranial fossa arachnoid cyst. 3. No evidence for facial bone fracture. Electronically Signed   By: Kerby Moors M.D.   On: 09/22/2019 10:15   DG Chest Port 1 View  Result Date: 09/22/2019 CLINICAL DATA:  Shortness of breath. EXAM: PORTABLE CHEST 1 VIEW COMPARISON:  01/26/2018 FINDINGS: Stable cardiomediastinal contour. Aortic atherosclerosis. Bilateral pleural effusions identified, right greater than left. Mild interstitial edema. No airspace opacities. IMPRESSION: 1. Bilateral pleural effusions and mild interstitial edema compatible with CHF. 2.  Aortic Atherosclerosis (ICD10-I70.0). Electronically Signed   By: Kerby Moors M.D.   On: 09/22/2019 09:08   DG Knee Complete 4 Views Right  Result Date: 09/22/2019 CLINICAL DATA:  Fall. EXAM: RIGHT KNEE - COMPLETE 4+ VIEW COMPARISON:  None FINDINGS: No joint effusion. There is focal soft tissue thickening of the overlying the mid and distal patellar tendon which measures up to 1.8 cm in thickness. Sharpening the tibial spines noted. No fracture or dislocation. No radio-opaque foreign body or soft tissue calcifications. IMPRESSION: 1. No acute bone  abnormality. 2. Focal thickening overlying the mid and distal patellar tendon concerning for underlying hematoma. 3. Mild osteoarthritis. Electronically Signed   By: Kerby Moors M.D.   On: 09/22/2019 09:47   CT Maxillofacial Wo Contrast  Result Date: 09/22/2019 CLINICAL DATA:  Facial trauma. EXAM: CT HEAD WITHOUT CONTRAST CT MAXILLOFACIAL WITHOUT CONTRAST TECHNIQUE: Multidetector CT imaging of the head and maxillofacial structures were performed using the standard protocol without intravenous contrast. Multiplanar CT image reconstructions of the  maxillofacial structures were also generated. COMPARISON:  None. FINDINGS: CT HEAD FINDINGS Brain: No evidence of acute infarction, hemorrhage, hydrocephalus, extra-axial collection or mass lesion/mass effect. Large, extra-axial CSF density structure within the right posterior cranial fossa is identified measuring 6.3 x 3.1 cm. Findings compatible with a arachnoid cyst. Vascular: No hyperdense vessel or unexpected calcification. Skull: Normal. Negative for fracture or focal lesion. Other: None. CT MAXILLOFACIAL FINDINGS Osseous: No fracture or mandibular dislocation. No destructive process. Orbits: Negative. No traumatic or inflammatory finding. Sinuses: The paranasal sinuses are clear. Soft tissues: Negative. IMPRESSION: 1. No acute intracranial abnormalities. 2. Large right posterior cranial fossa arachnoid cyst. 3. No evidence for facial bone fracture. Electronically Signed   By: Kerby Moors M.D.   On: 09/22/2019 10:15    EKG: Independently reviewed.  Sinus rhythm at 74 bpm with old anterior infarct  Assessment/Plan Acute on chronic respiratory failure with hypoxia and hypercapnia secondary to congestive heart failure exacerbation and/or COPD exacerbation: Patient presented with progressively worsening shortness of breath.  She is supposed to be on 4 L of oxygen 24/7, but had been found on room air at 84%.  Initial venous PCO2 elevated at 79.8.  Chest  x-ray noted bilateral pleural effusions with mild interstitial edema concerning for CHF exacerbation.  BNP was noted to be mildly elevated at 269.6.  Patient had been given steroids and breathing treatments before prior to arrival.  In the ED she received 40 mg Lasix IV. -Admit to a progressive bed -Heart failure order set utilized -Continuous pulse oximetry -BiPAP as needed -Strict intake and output and daily weights -Continue Lasix 40 mg IV twice daily  -Check echocardiogram -DuoNebs 4 times daily  -Brovana and budesonide nebs substituting Symbicort -Solu-Medrol IV  Hyponatremia: Acute.  Initial sodium noted to be as low as 112.  In this clinical setting suspected hypervolemic hyponatremia, but patient noted also has significant history of alcohol which may be secondary to factor into patient's symptoms. -Check urine sodium, urine osmolarity  -BMPs every 4 hours -Goals sodium correction less than 10 mEq/day  Labial infection: Acute.  Patient has erythema and wound of the labia appreciated on physical exam. -Check ESR and CRP -Rocephin IV for treatment of labial wound and COPD exacerbation  Pressure injury of the skin: Patient with multiple pressure wounds noted on the patient. -Routine wound care  Recent fall with right knee hematoma: At baseline patient reportedly ambulates with the use of a walker.  Patient appears unsafe to live alone at this time. -PT and OT consulted -Transitions of care consulted for possible need placement  Diabetes mellitus type 2: Blood sugars appear well controlled.  Last available hemoglobin A1cwas noted to be 6 in 2018.  Home medications include Jardiance 10 mg daily and Metformin 500 mg twice daily. -Hypoglycemic protocols -Hold oral medication -CBGs every 6 hours with sensitive SSI  Hypochromic anemia: Hemoglobin 9 on admission which appears near around patient's baseline.  Patient was noted to have some bruising appreciated, but vital signs otherwise  noted to be stable. -Continue to monitor   Anxiety: Home medications include Valium every 12 hours as needed for anxiety. -Continue Valium when able  Tobacco abuse: Patient reportedly still smokes on a daily basis. - nicotine patch  History of alcohol abuse: Patient reportedly drinking less than a pint of alcohol per day on average. -CIWA protocols initiated with IV Ativan as needed  GERD: Home medications include omeprazole. -Substituting Protonix 40 mg IV DVT prophylaxis: lovenox  Code Status: Full  Family Communication: Patient's brother updated over the phone Disposition Plan:TBD  Consults called: None Admission status: Inpatient  Norval Morton MD [facility] Pager [phone]   If 7PM-7AM, please contact night-coverage [email or web address] Password [facility]  09/22/2019, 12:21 PM

## 2019-09-22 NOTE — ED Triage Notes (Addendum)
Pt arrived from home via [facility] d/t using her life alert for SOB that had worsened over the past month. History of COPD and wearing PRN O2 at 4L via n/c at baseline. EMS reports on scene pt was c/o dyspnea & nausea, was sating at 84% on RA & her lung sounds were absent in both lower lobes. While in route to ED pt was given 0.3 Epi IM, 125 SoluMedrol, 5 Albuterol, 5 Atrovent, 2 mg of Mag, 5 mg Zofran & began to sat at 98% on NRB, her respirations went down from the 40's on RA to 28 with the NRB. Upon arrival EMS reports the pt has had 1 covid vaccine & pt is A/Ox4, verbal-able to make needs known, though she does have difficulty communicating through the dyspnea. Pt has a red area below her Right eye and on her forehead that she will not comment on, she denies these bruises came from a fall, she does endorse a recent fall within the last 3 months.

## 2019-09-22 NOTE — ED Notes (Signed)
Patient transported to X-ray 

## 2019-09-23 LAB — URINALYSIS, ROUTINE W REFLEX MICROSCOPIC
Bacteria, UA: NONE SEEN
Bilirubin Urine: NEGATIVE
Glucose, UA: >=500 mg/dL — AB
Hgb urine dipstick: NEGATIVE
Ketones, ur: NEGATIVE mg/dL
Leukocytes,Ua: NEGATIVE
Nitrite: NEGATIVE
Protein, ur: 30 mg/dL — AB
Specific Gravity, Urine: 1.007 (ref 1.005–1.030)
pH: 5 (ref 5.0–8.0)

## 2019-09-23 LAB — BASIC METABOLIC PANEL
Anion gap: 13 (ref 5–15)
Anion gap: 12 (ref 5–15)
Anion gap: 8 (ref 5–15)
Anion gap: 12 (ref 5–15)
BUN: 11 mg/dL (ref 8–23)
BUN: 11 mg/dL (ref 8–23)
BUN: 18 mg/dL (ref 8–23)
BUN: 20 mg/dL (ref 8–23)
CO2: 32 mmol/L (ref 22–32)
CO2: 34 mmol/L — ABNORMAL HIGH (ref 22–32)
CO2: 39 mmol/L — ABNORMAL HIGH (ref 22–32)
CO2: 34 mmol/L — ABNORMAL HIGH (ref 22–32)
Calcium: 8.2 mg/dL — ABNORMAL LOW (ref 8.9–10.3)
Calcium: 8.4 mg/dL — ABNORMAL LOW (ref 8.9–10.3)
Calcium: 8.7 mg/dL — ABNORMAL LOW (ref 8.9–10.3)
Calcium: 8.6 mg/dL — ABNORMAL LOW (ref 8.9–10.3)
Chloride: 72 mmol/L — ABNORMAL LOW (ref 98–111)
Chloride: 75 mmol/L — ABNORMAL LOW (ref 98–111)
Chloride: 75 mmol/L — ABNORMAL LOW (ref 98–111)
Chloride: 77 mmol/L — ABNORMAL LOW (ref 98–111)
Creatinine, Ser: 0.69 mg/dL (ref 0.44–1.00)
Creatinine, Ser: 0.8 mg/dL (ref 0.44–1.00)
Creatinine, Ser: 0.95 mg/dL (ref 0.44–1.00)
Creatinine, Ser: 1.09 mg/dL — ABNORMAL HIGH (ref 0.44–1.00)
GFR calc Af Amer: >60 mL/min (ref >60)
GFR calc Af Amer: >60 mL/min (ref >60)
GFR calc Af Amer: >60 mL/min (ref >60)
GFR calc Af Amer: 58 mL/min — ABNORMAL LOW (ref >60)
GFR calc non Af Amer: >60 mL/min (ref >60)
GFR calc non Af Amer: >60 mL/min (ref >60)
GFR calc non Af Amer: 59 mL/min — ABNORMAL LOW (ref >60)
GFR calc non Af Amer: 50 mL/min — ABNORMAL LOW (ref >60)
Glucose, Bld: 103 mg/dL — ABNORMAL HIGH (ref 70–99)
Glucose, Bld: 108 mg/dL — ABNORMAL HIGH (ref 70–99)
Glucose, Bld: 137 mg/dL — ABNORMAL HIGH (ref 70–99)
Glucose, Bld: 211 mg/dL — ABNORMAL HIGH (ref 70–99)
Potassium: 4.4 mmol/L (ref 3.5–5.1)
Potassium: 4.5 mmol/L (ref 3.5–5.1)
Potassium: 4.1 mmol/L (ref 3.5–5.1)
Potassium: 4.2 mmol/L (ref 3.5–5.1)
Sodium: 117 mmol/L — CL (ref 135–145)
Sodium: 121 mmol/L — ABNORMAL LOW (ref 135–145)
Sodium: 122 mmol/L — ABNORMAL LOW (ref 135–145)
Sodium: 123 mmol/L — ABNORMAL LOW (ref 135–145)

## 2019-09-23 LAB — MAGNESIUM: Magnesium: 2.1 mg/dL (ref 1.7–2.4)

## 2019-09-23 LAB — CBC WITH DIFFERENTIAL/PLATELET
Abs Immature Granulocytes: 0.03 10*3/uL (ref 0.00–0.07)
Basophils Absolute: 0 10*3/uL (ref 0.0–0.1)
Basophils Relative: 0 %
Eosinophils Absolute: 0 10*3/uL (ref 0.0–0.5)
Eosinophils Relative: 0 %
HCT: 29.3 % — ABNORMAL LOW (ref 36.0–46.0)
Hemoglobin: 8.9 g/dL — ABNORMAL LOW (ref 12.0–15.0)
Immature Granulocytes: 0 %
Lymphocytes Relative: 4 %
Lymphs Abs: 0.3 10*3/uL — ABNORMAL LOW (ref 0.7–4.0)
MCH: 25.2 pg — ABNORMAL LOW (ref 26.0–34.0)
MCHC: 30.4 g/dL (ref 30.0–36.0)
MCV: 83 fL (ref 80.0–100.0)
Monocytes Absolute: 0.2 10*3/uL (ref 0.1–1.0)
Monocytes Relative: 3 %
Neutro Abs: 6.3 10*3/uL (ref 1.7–7.7)
Neutrophils Relative %: 93 %
Platelets: 228 10*3/uL (ref 150–400)
RBC: 3.53 MIL/uL — ABNORMAL LOW (ref 3.87–5.11)
RDW: 14.8 % (ref 11.5–15.5)
WBC: 6.7 10*3/uL (ref 4.0–10.5)
nRBC: 0.6 % — ABNORMAL HIGH (ref 0.0–0.2)

## 2019-09-23 LAB — BLOOD GAS, ARTERIAL
Acid-Base Excess: 9.9 mmol/L — ABNORMAL HIGH (ref 0.0–2.0)
Bicarbonate: 35.6 mmol/L — ABNORMAL HIGH (ref 20.0–28.0)
Drawn by: 42624
FIO2: 50
O2 Saturation: 99.1 %
Patient temperature: 36.4
pCO2 arterial: 63.6 mmHg — ABNORMAL HIGH (ref 32.0–48.0)
pH, Arterial: 7.363 (ref 7.350–7.450)
pO2, Arterial: 128 mmHg — ABNORMAL HIGH (ref 83.0–108.0)

## 2019-09-23 LAB — OSMOLALITY, URINE: Osmolality, Ur: 295 mOsm/kg — ABNORMAL LOW (ref 300–900)

## 2019-09-23 LAB — HEPATIC FUNCTION PANEL
ALT: 16 U/L (ref 0–44)
AST: 21 U/L (ref 15–41)
Albumin: 3.2 g/dL — ABNORMAL LOW (ref 3.5–5.0)
Alkaline Phosphatase: 56 U/L (ref 38–126)
Bilirubin, Direct: 0.1 mg/dL (ref 0.0–0.2)
Indirect Bilirubin: 0.8 mg/dL (ref 0.3–0.9)
Total Bilirubin: 0.9 mg/dL (ref 0.3–1.2)
Total Protein: 5.8 g/dL — ABNORMAL LOW (ref 6.5–8.1)

## 2019-09-23 LAB — GLUCOSE, CAPILLARY
Glucose-Capillary: 104 mg/dL — ABNORMAL HIGH (ref 70–99)
Glucose-Capillary: 120 mg/dL — ABNORMAL HIGH (ref 70–99)
Glucose-Capillary: 132 mg/dL — ABNORMAL HIGH (ref 70–99)
Glucose-Capillary: 179 mg/dL — ABNORMAL HIGH (ref 70–99)
Glucose-Capillary: 224 mg/dL — ABNORMAL HIGH (ref 70–99)
Glucose-Capillary: 264 mg/dL — ABNORMAL HIGH (ref 70–99)

## 2019-09-23 LAB — HEMOGLOBIN A1C
Hgb A1c MFr Bld: 6.4 % — ABNORMAL HIGH (ref 4.8–5.6)
Mean Plasma Glucose: 136.98 mg/dL

## 2019-09-23 LAB — PREALBUMIN: Prealbumin: 15.1 mg/dL — ABNORMAL LOW (ref 18–38)

## 2019-09-23 LAB — SODIUM, URINE, RANDOM: Sodium, Ur: 29 mmol/L

## 2019-09-23 LAB — PHOSPHORUS: Phosphorus: 5 mg/dL — ABNORMAL HIGH (ref 2.5–4.6)

## 2019-09-23 MED ORDER — METOPROLOL TARTRATE 5 MG/5ML IV SOLN
5.0000 mg | Freq: Four times a day (QID) | INTRAVENOUS | Status: DC | PRN
  Administered 2019-09-24 (×2): 5 mg via INTRAVENOUS
  Filled 2019-09-23 (×3): qty 5

## 2019-09-23 NOTE — Evaluation (Signed)
Physical Therapy Evaluation Patient Details Name: [name] MRN: [id number] DOB: 13-Mar-1945 Today's Date: 09/23/2019   History of Present Illness  74 year old female with HTN, HLD, chronic diastolic CHF, COPD on 4 L at home, DM 2, tobacco and alcohol abuse, came into the hospital with shortness of breath.  Patient tells me that she has been short of breath over the last month however it is gotten progressively worse, she could not take it and came to the hospital and she was admitted on 9/18.  She reports several falls recently with bruises to the right side of her face, shoulder and legs.  On arrival she was found on room air satting 84%, was placed on a nonrebreather.  She was admitted to the hospital and placed on BiPAP overnight  Clinical Impression  Pt admitted with above diagnosis. Pt was able to sit EOB for 10 min with min guard assist. Refused to stand or ambulate. Weakness noted and pt states she feels she needs to be stronger before she goes home. Recommend SNF with therapy.  Pt currently with functional limitations due to the deficits listed below (see PT Problem List). Pt will benefit from skilled PT to increase their independence and safety with mobility to allow discharge to the venue listed below.      Follow Up Recommendations SNF;Supervision/Assistance - 24 hour    Equipment Recommendations  None recommended by PT    Recommendations for Other Services       Precautions / Restrictions Precautions Precautions: Fall Restrictions Weight Bearing Restrictions: No      Mobility  Bed Mobility Overal bed mobility: Needs Assistance Bed Mobility: Supine to Sit     Supine to sit: Min assist     General bed mobility comments: Needed assist to initiate movement of LEs off bed.  Needed a little assist with elevation of trunk as well.   Transfers                 General transfer comment: refused to stand  Ambulation/Gait             General Gait  Details: refused OOB today  Stairs            Wheelchair Mobility    Modified Rankin (Stroke Patients Only)       Balance Overall balance assessment: Needs assistance Sitting-balance support: No upper extremity supported;Feet supported Sitting balance-Leahy Scale: Fair Sitting balance - Comments: No assist needed to sit EOB.                                      Pertinent Vitals/Pain Pain Assessment: Faces Faces Pain Scale: Hurts little more Pain Location: bladder Pain Descriptors / Indicators: Aching;Discomfort Pain Intervention(s): Limited activity within patient's tolerance;Monitored during session;Repositioned    Home Living Family/patient expects to be discharged to:: Private residence Living Arrangements: Alone Available Help at Discharge: Family;Friend(s);Available PRN/intermittently Type of Home: House Home Access: Stairs to enter Entrance Stairs-Rails: Psychiatric nurse of Steps: 3 Home Layout: One level Home Equipment: Walker - 4 wheels;Toilet riser;Wheelchair - [employer];Shower seat Additional Comments: 4LO2 at home PTA    Prior Function Level of Independence: Independent with assistive device(s)         Comments: uses rollator in home, [facility] for long distances; brother available to assist prn     Hand Dominance   Dominant Hand: Right    Extremity/Trunk Assessment  Upper Extremity Assessment Upper Extremity Assessment: Defer to OT evaluation    Lower Extremity Assessment Lower Extremity Assessment: Generalized weakness    Cervical / Trunk Assessment Cervical / Trunk Assessment: Kyphotic  Communication   Communication: No difficulties  Cognition Arousal/Alertness: Awake/alert Behavior During Therapy: WFL for tasks assessed/performed Overall Cognitive Status: Within Functional Limits for tasks assessed                                        General Comments General comments  (skin integrity, edema, etc.): 102 bpm, 94% on 4LO2, 119/65,   desat to 86% with activity and needed 5LO2. LEFt on 5LO2 with sats 92%    Exercises     Assessment/Plan    PT Assessment Patient needs continued PT services  PT Problem List Decreased activity tolerance;Decreased balance;Decreased mobility;Decreased knowledge of use of DME;Decreased safety awareness;Decreased knowledge of precautions;Cardiopulmonary status limiting activity       PT Treatment Interventions DME instruction;Gait training;Functional mobility training;Therapeutic activities;Therapeutic exercise;Balance training;Patient/family education    PT Goals (Current goals can be found in the Care Plan section)  Acute Rehab PT Goals Patient Stated Goal: to get better and stronger PT Goal Formulation: With patient Time For Goal Achievement: 10/07/19 Potential to Achieve Goals: Good    Frequency Min 3X/week   Barriers to discharge Decreased caregiver support      Co-evaluation               AM-PAC PT "6 Clicks" Mobility  Outcome Measure Help needed turning from your back to your side while in a flat bed without using bedrails?: A Little Help needed moving from lying on your back to sitting on the side of a flat bed without using bedrails?: A Little Help needed moving to and from a bed to a chair (including a wheelchair)?: Total Help needed standing up from a chair using your arms (e.g., wheelchair or bedside chair)?: Total Help needed to walk in hospital room?: Total Help needed climbing 3-5 steps with a railing? : Total 6 Click Score: 10    End of Session Equipment Utilized During Treatment: Gait belt;Oxygen Activity Tolerance: Patient limited by fatigue Patient left: in bed;with call bell/phone within reach;with bed alarm set Nurse Communication: Mobility status (Need for O2 incr after activity from 4-5L) PT Visit Diagnosis: Unsteadiness on feet (R26.81);Muscle weakness (generalized) (M62.81)     Time: 3716-9678 PT Time Calculation (min) (ACUTE ONLY): 25 min   Charges:   PT Evaluation $PT Eval Moderate Complexity: 1 Mod PT Treatments $Therapeutic Activity: 8-22 mins        Jazmene Racz W,PT Acute Rehabilitation Services Pager:  [phone]  Office:  [place] 09/23/2019, 1:51 PM

## 2019-09-23 NOTE — Progress Notes (Signed)
   09/23/19 2024  Assess: MEWS Score  Temp 99.1 F (37.3 C)  BP 105/82  Pulse Rate (!) 107  ECG Heart Rate (!) 110  Resp (!) 24  SpO2 94 %  O2 Device Nasal Cannula  O2 Flow Rate (L/min) 4 L/min  Assess: MEWS Score  MEWS Temp 0  MEWS Systolic 0  MEWS Pulse 1  MEWS RR 1  MEWS LOC 0  MEWS Score 2  MEWS Score Color Yellow  Assess: if the MEWS score is Yellow or Red  Were vital signs taken at a resting state? Yes  Focused Assessment No change from prior assessment  Early Detection of Sepsis Score *See Row Information* Medium  MEWS guidelines implemented *See Row Information* Yes  Treat  MEWS Interventions Escalated (See documentation below)  Pain Scale 0-10  Pain Score 0  Take Vital Signs  Increase Vital Sign Frequency  Yellow: Q 2hr X 2 then Q 4hr X 2, if remains yellow, continue Q 4hrs  Escalate  MEWS: Escalate Yellow: discuss with charge nurse/RN and consider discussing with provider and RRT  Notify: Charge Nurse/RN  Name of Charge Nurse/RN Notified Christina RN  Date Charge Nurse/RN Notified 09/23/19  Time Charge Nurse/RN Notified 2141  Notify: Provider  Provider Name/Title [place]  Date Provider Notified 09/23/19  Time Provider Notified 2050  Notification Type Page  Notification Reason Change in status  Response See new orders  Date of Provider Response 09/23/19  Time of Provider Response 2053

## 2019-09-23 NOTE — Progress Notes (Signed)
Report given to [facility], Therapist, sports. Patient is alert and oriented x4 resting in bed with call light in reach. Bed alarm activated. BIPAP in place and VSS. Q6 FSBS. Repeat ABG done to evaluate pH and CO2 for BIPAP weaning.  Wound care to see patient along with PT/OT to assess for possible placement. IV antibiotics in place for COPD and labial infection.  Na+ 117, K+ 4.4, Creat 0.69 this AM.

## 2019-09-23 NOTE — Progress Notes (Signed)
PROGRESS NOTE  [name] XNT:[id number] DOB: [birth year] DOA: 09/22/2019 PCP: Lujean Amel, MD   LOS: 1 day   Brief Narrative / Interim history: 74 year old female with HTN, HLD, chronic diastolic CHF, COPD on 4 L at home, DM 2, tobacco and alcohol abuse, came into the hospital with shortness of breath.  Patient tells me that she has been short of breath over the last month however it is gotten progressively worse, she could not take it and came to the hospital and she was admitted on 9/18.  She reports several falls recently with bruises to the right side of her face, shoulder and legs.  On arrival she was found on room air satting 84%, was placed on a nonrebreather.  She was admitted to the hospital and placed on BiPAP overnight  Subjective / 24h Interval events: She is feeling better this morning, just came off BiPAP.  Denies any chest pain, denies any abdominal pain, no nausea or vomiting.  Assessment & Plan: Principal Problem Acute on chronic hypoxic and hypercapnic respiratory failure due to acute on chronic diastolic CHF with acute pulmonary edema, and COPD exacerbation -Patient has been apparently noncompliant with her home oxygen as she was found on room air.  Chest x-ray showed bilateral pleural effusions with edema, she has 1+ edema and BNP was elevated. -Patient will be treated with IV Lasix, nebulizers, steroids for COPD exacerbation along with ceftriaxone.   -2D echo was done on 9/18 showed an EF of 60-65% with grade 1 diastolic dysfunction  Active Problems Acute hyponatremia -suspect hypovolemic hyponatremia, but also with her history of alcohol abuse may contribute -Sodium gradually improving with Lasix, continue to monitor, unfortunately labs are not being drawn as ordered  Labial infection, acute  -Erythema in one of the left labia, exam done with RN buffering, no fluctuance or deep abscess noted.  She was started on ceftriaxone, continue  Right knee  hematoma -Following a fall, monitor  Anemia of chronic disease -Monitor hemoglobin  Tobacco and alcohol use -Continue patch, continue CIWA.  She denies prior withdrawal symptoms  Diabetes mellitus type 2, controlled -SSI, CBG controlled  CBG (last 3)  Recent Labs    09/22/19 1802 09/23/19 0013 09/23/19 0617  GLUCAP 126* 104* 120*    Scheduled Meds: . arformoterol  15 mcg Nebulization BID  . atorvastatin  20 mg Oral QPM  . budesonide (PULMICORT) nebulizer solution  0.5 mg Nebulization BID  . enoxaparin (LOVENOX) injection  40 mg Subcutaneous Daily  . folic acid  1 mg Oral Daily  . furosemide  40 mg Intravenous BID  . insulin aspart  0-9 Units Subcutaneous Q6H  . ipratropium-albuterol  3 mL Nebulization QID  . methylPREDNISolone (SOLU-MEDROL) injection  60 mg Intravenous BID  . multivitamin with minerals  1 tablet Oral Daily  . nicotine  21 mg Transdermal Daily  . nystatin   Topical BID  . pantoprazole (PROTONIX) IV  40 mg Intravenous Q24H  . sodium chloride flush  3 mL Intravenous Q12H  . thiamine  100 mg Oral Daily   Or  . thiamine  100 mg Intravenous Daily   Continuous Infusions: . sodium chloride    . cefTRIAXone (ROCEPHIN)  IV Stopped (09/22/19 1930)   PRN Meds:.sodium chloride, acetaminophen, diazepam, ipratropium-albuterol, LORazepam **OR** LORazepam, ondansetron (ZOFRAN) IV, sodium chloride flush  Diet Orders (From admission, onward)    Start     Ordered   09/23/19 0129  Diet heart healthy/carb modified Room service appropriate? Yes with Assist;  Fluid consistency: Thin; Fluid restriction: 2000 mL Fluid  Diet effective now       Question Answer Comment  Diet-HS Snack? Nothing   Room service appropriate? Yes with Assist   Fluid consistency: Thin   Fluid restriction: 2000 mL Fluid      09/23/19 0128          DVT prophylaxis: enoxaparin (LOVENOX) injection 40 mg Start: 09/22/19 1245    Code Status: Full Code  Family Communication: Discussed with  brother [phone]  Status is: Inpatient  Remains inpatient appropriate because:Inpatient level of care appropriate due to severity of illness   Dispo: The patient is from: Home              Anticipated d/c is to: SNF              Anticipated d/c date is: 3 days              Patient currently is not medically stable to d/c.  Consultants:  None   Procedures:  2D echo  Microbiology  None   Antimicrobials: Ceftriaxone 9/18 >>    Objective: Vitals:   09/23/19 0309 09/23/19 0410 09/23/19 0738 09/23/19 0855  BP:  (!) 144/64 (!) 126/45   Pulse: 76 85 85   Resp: (!) 22 20 19    Temp:  97.6 F (36.4 C) 98.1 F (36.7 C)   TempSrc:  Axillary Axillary   SpO2: 99% 100% 97% 97%  Weight:  80.5 kg    Height:        Intake/Output Summary (Last 24 hours) at 09/23/2019 1014 Last data filed at 09/22/2019 2330 Gross per 24 hour  Intake 87.93 ml  Output 200 ml  Net -112.07 ml   Filed Weights   09/22/19 2330 09/23/19 0410  Weight: 80.5 kg 80.5 kg    Examination:  Constitutional: No distress, in bed Eyes: no scleral icterus ENMT: Mucous membranes are moist.  Neck: normal, supple Respiratory: Coarse breath sounds bilaterally, faint end expiratory wheezing, diminished at the bases Cardiovascular: Regular rate and rhythm, no murmurs / rubs / gallops.  1+ pitting lower extremity edema Abdomen: non distended, no tenderness. Bowel sounds positive.  Musculoskeletal: no clubbing / cyanosis.  Skin: Small ulceration surrounding erythema on left labia Neurologic: Nonfocal Psychiatric: Normal judgment and insight. Alert and oriented x 3. Normal mood.    Data Reviewed: I have independently reviewed following labs and imaging studies   CBC: Recent Labs  Lab 09/22/19 0900 09/22/19 1007 09/23/19 0755  WBC 8.0  --  6.7  NEUTROABS 7.3  --  6.3  HGB 9.0* 11.2* 8.9*  HCT 29.4* 33.0* 29.3*  MCV 82.8  --  83.0  PLT 243  --  568   Basic Metabolic Panel: Recent Labs  Lab  09/22/19 0900 09/22/19 1007 09/22/19 1521 09/22/19 2052 09/23/19 0138 09/23/19 0755  NA 113* 112* 116* 118* 117*  --   K 4.9 4.9 5.1 4.7 4.4  --   CL 72*  --  72* 73* 72*  --   CO2 29  --  33* 33* 32  --   GLUCOSE 110*  --  132* 115* 103*  --   BUN 10  --  11 11 11   --   CREATININE 0.55  --  0.70 0.74 0.69  --   CALCIUM 8.5*  --  8.4* 8.4* 8.2*  --   MG  --   --   --   --   --  2.1  PHOS  --   --   --   --   --  5.0*   Liver Function Tests: Recent Labs  Lab 09/23/19 0755  AST 21  ALT 16  ALKPHOS 56  BILITOT 0.9  PROT 5.8*  ALBUMIN 3.2*   Coagulation Profile: No results for input(s): INR, PROTIME in the last 168 hours. HbA1C: Recent Labs    09/23/19 0138  HGBA1C 6.4*   CBG: Recent Labs  Lab 09/22/19 1802 09/23/19 0013 09/23/19 0617  GLUCAP 126* 104* 120*    Recent Results (from the past 240 hour(s))  SARS Coronavirus 2 by RT PCR (hospital order, performed in [facility] hospital lab) Nasopharyngeal Nasopharyngeal Swab     Status: None   Collection Time: 09/22/19  8:46 AM   Specimen: Nasopharyngeal Swab  Result Value Ref Range Status   SARS Coronavirus 2 NEGATIVE NEGATIVE Final    Comment: (NOTE) SARS-CoV-2 target nucleic acids are NOT DETECTED.  The SARS-CoV-2 RNA is generally detectable in upper and lower respiratory specimens during the acute phase of infection. The lowest concentration of SARS-CoV-2 viral copies this assay can detect is 250 copies / mL. A negative result does not preclude SARS-CoV-2 infection and should not be used as the sole basis for treatment or other patient management decisions.  A negative result may occur with improper specimen collection / handling, submission of specimen other than nasopharyngeal swab, presence of viral mutation(s) within the areas targeted by this assay, and inadequate number of viral copies (<250 copies / mL). A negative result must be combined with clinical observations, patient history, and  epidemiological information.  Fact Sheet for Patients:   StrictlyIdeas.no  Fact Sheet for Healthcare Providers: BankingDealers.co.za  This test is not yet approved or  cleared by the [place] FDA and has been authorized for detection and/or diagnosis of SARS-CoV-2 by FDA under an Emergency Use Authorization (EUA).  This EUA will remain in effect (meaning this test can be used) for the duration of the COVID-19 declaration under Section 564(b)(1) of the Act, 21 U.S.C. section 360bbb-3(b)(1), unless the authorization is terminated or revoked sooner.  Performed at [facility], [place] [address] [place], [place] [address]      Radiology Studies: ECHOCARDIOGRAM COMPLETE  Result Date: 09/22/2019    ECHOCARDIOGRAM REPORT   Patient Name:   KATERYN MARASIGAN Date of Exam: 09/22/2019 Medical Rec #:  [id number]           Height:       68.0 in Accession #:    [id number]          Weight:       161.2 lb Date of Birth:  [birth year]           BSA:          1.865 m Patient Age:    76 years            BP:           151/74 mmHg Patient Gender: F                   HR:           82 bpm. Exam Location:  Inpatient Procedure: 2D Echo, Cardiac Doppler and Color Doppler Indications:    CHF-Acute Diastolic 151.76 / H60.73  History:        Patient has no prior history of Echocardiogram examinations.  COPD; Risk Factors:Hypertension, Dyslipidemia and Diabetes.  Sonographer:    Bernadene Person RDCS Referring Phys: [id number] RONDELL A SMITH IMPRESSIONS  1. Left ventricular ejection fraction, by estimation, is 60 to 65%. The left ventricle has normal function. The left ventricle has no regional wall motion abnormalities. Left ventricular diastolic parameters are consistent with Grade I diastolic dysfunction (impaired relaxation).  2. Right ventricular systolic function is normal. The right ventricular size is normal.  3. The mitral valve is normal in  structure. No evidence of mitral valve regurgitation. No evidence of mitral stenosis.  4. The aortic valve is normal in structure. Aortic valve regurgitation is not visualized. No aortic stenosis is present.  5. The inferior vena cava is normal in size with greater than 50% respiratory variability, suggesting right atrial pressure of 3 mmHg. FINDINGS  Left Ventricle: Left ventricular ejection fraction, by estimation, is 60 to 65%. The left ventricle has normal function. The left ventricle has no regional wall motion abnormalities. The left ventricular internal cavity size was normal in size. There is  no left ventricular hypertrophy. Left ventricular diastolic parameters are consistent with Grade I diastolic dysfunction (impaired relaxation). Right Ventricle: The right ventricular size is normal. No increase in right ventricular wall thickness. Right ventricular systolic function is normal. Left Atrium: Left atrial size was normal in size. Right Atrium: Right atrial size was normal in size. Pericardium: There is no evidence of pericardial effusion. Mitral Valve: The mitral valve is normal in structure. No evidence of mitral valve regurgitation. No evidence of mitral valve stenosis. Tricuspid Valve: The tricuspid valve is normal in structure. Tricuspid valve regurgitation is mild . No evidence of tricuspid stenosis. Aortic Valve: The aortic valve is normal in structure. Aortic valve regurgitation is not visualized. No aortic stenosis is present. Pulmonic Valve: The pulmonic valve was normal in structure. Pulmonic valve regurgitation is not visualized. No evidence of pulmonic stenosis. Aorta: The aortic root is normal in size and structure. Venous: The inferior vena cava is normal in size with greater than 50% respiratory variability, suggesting right atrial pressure of 3 mmHg. IAS/Shunts: No atrial level shunt detected by color flow Doppler.  LEFT VENTRICLE PLAX 2D LVIDd:         5.30 cm  Diastology LVIDs:          3.80 cm  LV e' medial:    5.22 cm/s LV PW:         0.90 cm  LV E/e' medial:  20.3 LV IVS:        0.90 cm  LV e' lateral:   7.40 cm/s LVOT diam:     1.90 cm  LV E/e' lateral: 14.3 LV SV:         69 LV SV Index:   37 LVOT Area:     2.84 cm  RIGHT VENTRICLE RV S prime:     14.40 cm/s TAPSE (M-mode): 2.2 cm LEFT ATRIUM           Index       RIGHT ATRIUM           Index LA diam:      4.30 cm 2.31 cm/m  RA Area:     16.30 cm LA Vol (A2C): 30.6 ml 16.41 ml/m RA Volume:   42.60 ml  22.84 ml/m LA Vol (A4C): 40.0 ml 21.45 ml/m  AORTIC VALVE LVOT Vmax:   114.00 cm/s LVOT Vmean:  80.300 cm/s LVOT VTI:    0.245 m  AORTA Ao Root diam: 2.90  cm Ao Asc diam:  2.50 cm MITRAL VALVE MV Area (PHT): 4.60 cm     SHUNTS MV Decel Time: 165 msec     Systemic VTI:  0.24 m MV E velocity: 106.00 cm/s  Systemic Diam: 1.90 cm MV A velocity: 128.00 cm/s MV E/A ratio:  0.83 Candee Furbish MD Electronically signed by Candee Furbish MD Signature Date/Time: 09/22/2019/2:06:37 PM    Final    Marzetta Board, MD, PhD [facility]  Between 7 am - 7 pm I am available, please contact me via Amion or Securechat  Between 7 pm - 7 am I am not available, please contact night coverage MD/APP via Amion

## 2019-09-23 NOTE — Progress Notes (Signed)
Noticed her HR became irregular ST in 100-110s.  Seemed like it started around 1700 per tele.  Passed it to night shift.  Idolina Primer, RN

## 2019-09-23 NOTE — Progress Notes (Signed)
Placed patient on BIPAP per patient request. No distress noted.

## 2019-09-24 LAB — BASIC METABOLIC PANEL
Anion gap: 14 (ref 5–15)
BUN: 19 mg/dL (ref 8–23)
CO2: 33 mmol/L — ABNORMAL HIGH (ref 22–32)
Calcium: 8.7 mg/dL — ABNORMAL LOW (ref 8.9–10.3)
Chloride: 77 mmol/L — ABNORMAL LOW (ref 98–111)
Creatinine, Ser: 0.87 mg/dL (ref 0.44–1.00)
GFR calc Af Amer: >60 mL/min (ref >60)
GFR calc non Af Amer: >60 mL/min (ref >60)
Glucose, Bld: 156 mg/dL — ABNORMAL HIGH (ref 70–99)
Potassium: 4.1 mmol/L (ref 3.5–5.1)
Sodium: 124 mmol/L — ABNORMAL LOW (ref 135–145)

## 2019-09-24 LAB — GLUCOSE, CAPILLARY
Glucose-Capillary: 168 mg/dL — ABNORMAL HIGH (ref 70–99)
Glucose-Capillary: 164 mg/dL — ABNORMAL HIGH (ref 70–99)
Glucose-Capillary: 167 mg/dL — ABNORMAL HIGH (ref 70–99)

## 2019-09-24 LAB — CBC
HCT: 28.6 % — ABNORMAL LOW (ref 36.0–46.0)
Hemoglobin: 8.9 g/dL — ABNORMAL LOW (ref 12.0–15.0)
MCH: 25.9 pg — ABNORMAL LOW (ref 26.0–34.0)
MCHC: 31.1 g/dL (ref 30.0–36.0)
MCV: 83.4 fL (ref 80.0–100.0)
Platelets: 219 10*3/uL (ref 150–400)
RBC: 3.43 MIL/uL — ABNORMAL LOW (ref 3.87–5.11)
RDW: 15 % (ref 11.5–15.5)
WBC: 5.8 10*3/uL (ref 4.0–10.5)
nRBC: 0.7 % — ABNORMAL HIGH (ref 0.0–0.2)

## 2019-09-24 LAB — HEPARIN LEVEL (UNFRACTIONATED): Heparin Unfractionated: 0.66 IU/mL (ref 0.30–0.70)

## 2019-09-24 MED ORDER — HEPARIN BOLUS VIA INFUSION
2000.0000 [IU] | Freq: Once | INTRAVENOUS | Status: AC
  Administered 2019-09-24: 2000 [IU] via INTRAVENOUS
  Filled 2019-09-24: qty 2000

## 2019-09-24 MED ORDER — METOPROLOL TARTRATE 50 MG PO TABS
50.0000 mg | ORAL_TABLET | Freq: Two times a day (BID) | ORAL | Status: DC
  Administered 2019-09-24 – 2019-09-29 (×11): 50 mg via ORAL
  Filled 2019-09-24 (×11): qty 1

## 2019-09-24 MED ORDER — HEPARIN (PORCINE) 25000 UT/250ML-% IV SOLN
1000.0000 [IU]/h | INTRAVENOUS | Status: DC
  Administered 2019-09-24 – 2019-09-25 (×2): 1000 [IU]/h via INTRAVENOUS
  Filled 2019-09-24 (×2): qty 250

## 2019-09-24 NOTE — Progress Notes (Signed)
ANTICOAGULATION CONSULT NOTE  Pharmacy Consult for heparin Indication: atrial fibrillation  No Known Allergies  Patient Measurements: Height: 5\' 8"  (172.7 cm) Weight: 78.3 kg (172 lb 9.9 oz) IBW/kg (Calculated) : 63.9  Vital Signs: Temp: 97.9 F (36.6 C) (09/20 1100) Temp Source: Axillary (09/20 1100) BP: 108/54 (09/20 1300) Pulse Rate: 98 (09/20 1300)  Labs: Recent Labs    09/22/19 0900 09/22/19 0900 09/22/19 1007 09/22/19 1521 09/23/19 0755 09/23/19 0755 09/23/19 1806 09/23/19 2146 09/24/19 0315 09/24/19 1345  HGB 9.0*   < > 11.2*  --  8.9*  --   --   --  8.9*  --   HCT 29.4*   < > 33.0*  --  29.3*  --   --   --  28.6*  --   PLT 243  --   --   --  228  --   --   --  219  --   HEPARINUNFRC  --   --   --   --   --   --   --   --   --  0.66  CREATININE 0.55  --   --    < > 0.80   < > 0.95 1.09* 0.87  --    < > = values in this interval not displayed.    Estimated Creatinine Clearance: 63.4 mL/min (by C-G formula based on SCr of 0.87 mg/dL).   Medical History: Past Medical History:  Diagnosis Date   Acute exacerbation of CHF (congestive heart failure) ([place]) 09/22/2019   COPD (chronic obstructive pulmonary disease) (HCC)    Diabetes mellitus    Diastolic dysfunction    history   Hyperlipidemia    Hypertension     Medications:  Medications Prior to Admission  Medication Sig Dispense Refill Last Dose   amLODipine-valsartan (EXFORGE) 10-320 MG tablet Take 1 tablet by mouth daily.    UNK   atorvastatin (LIPITOR) 20 MG tablet Take 20 mg by mouth every evening.   09/21/2019 at Unknown time   diazepam (VALIUM) 5 MG tablet Take 1 tablet (5 mg total) by mouth every 12 (twelve) hours as needed for anxiety. 12 tablet 0 UNK   empagliflozin (JARDIANCE) 10 MG TABS tablet Take 10 mg by mouth daily.   UNK   ipratropium-albuterol (DUONEB) 0.5-2.5 (3) MG/3ML SOLN Take 3 mLs by nebulization every 4 (four) hours as needed. 360 mL  UNK   metFORMIN (GLUCOPHAGE) 500 MG  tablet Take 500 mg by mouth 2 (two) times daily with a meal.   09/21/2019 at Unknown time   metoprolol succinate (TOPROL-XL) 25 MG 24 hr tablet Take 25 mg by mouth daily.   UNK   Multiple Vitamin (MULTIVITAMIN WITH MINERALS) TABS tablet Take 1 tablet by mouth daily.   UNK   omeprazole (PRILOSEC) 20 MG capsule Take 1 tablet by mouth daily.   UNK   PARoxetine (PAXIL) 20 MG tablet Take 20 mg by mouth daily.    UNK   SYMBICORT 160-4.5 MCG/ACT inhaler Inhale 2 puffs into the lungs 2 (two) times daily.  0 UNK   Amino Acids-Protein Hydrolys (FEEDING SUPPLEMENT, PRO-STAT SUGAR FREE 64,) LIQD Take 30 mLs by mouth 2 (two) times daily. (Patient not taking: Reported on 09/22/2019) 887 mL 0 Not Taking at Unknown time   aspirin EC 81 MG tablet Take 81 mg by mouth daily.      feeding supplement, ENSURE ENLIVE, (ENSURE ENLIVE) LIQD Take 237 mLs by mouth 2 (two) times daily between meals. (  Patient not taking: Reported on 09/22/2019) 237 mL 12 Not Taking at Unknown time   ferrous sulfate 325 (65 FE) MG tablet Take 1 tablet (325 mg total) by mouth 2 (two) times daily with a meal. (Patient not taking: Reported on 09/22/2019)  3 Not Taking at Unknown time   methocarbamol (ROBAXIN) 500 MG tablet Take 1 tablet (500 mg total) by mouth every 8 (eight) hours as needed for muscle spasms. (Patient not taking: Reported on 09/22/2019) 30 tablet 0 Not Taking at Unknown time   silver sulfADIAZINE (SILVADENE) 1 % cream Apply 1 application topically daily. (Patient not taking: Reported on 09/22/2019) 50 g 0 Not Taking at Unknown time   Scheduled:   arformoterol  15 mcg Nebulization BID   atorvastatin  20 mg Oral QPM   budesonide (PULMICORT) nebulizer solution  0.5 mg Nebulization BID   folic acid  1 mg Oral Daily   furosemide  40 mg Intravenous BID   insulin aspart  0-9 Units Subcutaneous Q6H   ipratropium-albuterol  3 mL Nebulization QID   methylPREDNISolone (SOLU-MEDROL) injection  60 mg Intravenous BID    metoprolol tartrate  50 mg Oral BID   multivitamin with minerals  1 tablet Oral Daily   nicotine  21 mg Transdermal Daily   nystatin   Topical BID   pantoprazole (PROTONIX) IV  40 mg Intravenous Q24H   sodium chloride flush  3 mL Intravenous Q12H   thiamine  100 mg Oral Daily   Or   thiamine  100 mg Intravenous Daily   Infusions:   sodium chloride     cefTRIAXone (ROCEPHIN)  IV 2 g (09/23/19 1851)   heparin 1,000 Units/hr (09/24/19 0540)    Assessment: 74yo female admitted w/ acute on chronic CHF, had been tachycardic overnight and now showing Afib on monitor, to begin heparin.  Initial heparin level is therapeutic at 0.66.  Goal of Therapy:  Heparin level 0.3-0.7 units/ml Monitor platelets by anticoagulation protocol: Yes   Plan:  -Continue heparin 1000 units/h -Recheck heparin level with am labs   Arrie Senate, PharmD, BCPS Clinical Pharmacist [phone] Please check AMION for all [place] numbers 09/24/2019

## 2019-09-24 NOTE — Progress Notes (Signed)
Overnight event  Patient tachycardic with heart rate in the low 100s to 120s.  Not hypotensive. Previously on BiPAP, now weaned down to 4 L supplemental oxygen and satting in the mid to upper 90s.  Patient is not endorsing any chest pain, palpitations, or shortness of breath.  EKGs showing A. Fib (reviewed with on-call cardiologist).  Patient denies any history of A. Fib.  CHA2DS2VASc 6.  I discussed benefits versus risks of anticoagulation therapy and patient wants to proceed with starting anticoagulation.  -IV heparin -IV metoprolol PRN -Recent echo done 09/22/2019 without LV thrombus

## 2019-09-24 NOTE — TOC Initial Note (Signed)
Transition of Care [facility]) - Initial/Assessment Note    Patient Details  Name: [name] MRN: [id number] Date of Birth: 1945/09/28  [facility] CM/SW Contact:    Trula Ore, [place] Phone Number: 09/24/2019, 12:34 PM  Clinical Narrative:                  CSW spoke with patient at bedside. Patient is agreeable to SNF placement.  CSW received consult for possible SNF placement at time of discharge. CSW spoke with patient at bedside regarding PT recommendation of SNF placement at time of discharge. Patient comes from home alone.Patient expressed understanding of PT recommendation and is agreeable to SNF placement at time of discharge. Patient gave CSW permission to fax out initial referral near the [place] area .  Patient has received the first COVID vaccine. Patient would like to get her second COVID vaccine. Patient gave CSW permission to discuss her care with her brother [name].No further questions reported at this time. CSW to continue to follow and assist with discharge planning needs.  Expected Discharge Plan: Skilled Nursing Facility Barriers to Discharge: Continued Medical Work up   Patient Goals and CMS Choice Patient states their goals for this hospitalization and ongoing recovery are:: to go to SNF CMS Medicare.gov Compare Post Acute Care list provided to:: Patient Choice offered to / list presented to : Patient  Expected Discharge Plan and Services Expected Discharge Plan: [place]       Living arrangements for the past 2 months: Single Family Home                                      Prior Living Arrangements/Services Living arrangements for the past 2 months: Single Family Home Lives with:: Self Patient language and need for interpreter reviewed:: Yes Do you feel safe going back to the place where you live?: No   SNF  Need for Family Participation in Patient Care: Yes (Comment) Care giver support system in place?:  Yes (comment)   Criminal Activity/Legal Involvement Pertinent to Current Situation/Hospitalization: No - Comment as needed  Activities of Daily Living Home Assistive Devices/Equipment: Oxygen, Walker (specify type), Shower chair with back, CPAP, Blood pressure cuff, CBG Meter (rollator walker) ADL Screening (condition at time of admission) Patient's cognitive ability adequate to safely complete daily activities?: Yes Is the patient deaf or have difficulty hearing?: No Does the patient have difficulty seeing, even when wearing glasses/contacts?: No Does the patient have difficulty concentrating, remembering, or making decisions?: Yes Patient able to express need for assistance with ADLs?: Yes Does the patient have difficulty dressing or bathing?: Yes Independently performs ADLs?: No Communication: Independent Dressing (OT): Needs assistance Is this a change from baseline?: Change from baseline, expected to last >3 days Grooming: Needs assistance Is this a change from baseline?: Change from baseline, expected to last >3 days Feeding: Independent Bathing: Needs assistance Is this a change from baseline?: Change from baseline, expected to last >3 days Toileting: Needs assistance Is this a change from baseline?: Change from baseline, expected to last >3days In/Out Bed: Needs assistance Is this a change from baseline?: Change from baseline, expected to last >3 days Walks in Home: Independent with device (comment) (uses walker) Does the patient have difficulty walking or climbing stairs?: Yes Weakness of Legs: Both Weakness of Arms/Hands: Both  Permission Sought/Granted Permission sought to share information with : Case Manager, Family  Supports, Chartered certified accountant granted to share information with : Yes, Verbal Permission Granted  Share Information with NAME: [name]  Permission granted to share info w AGENCY: SNF  Permission granted to share info w Relationship:  brother  Permission granted to share info w Contact Information: [place] [employer] [phone]  Emotional Assessment Appearance:: Appears stated age Attitude/Demeanor/Rapport: Gracious Affect (typically observed): Calm Orientation: : Oriented to Self, Oriented to Place, Oriented to  Time, Oriented to Situation Alcohol / Substance Use: Not Applicable Psych Involvement: No (comment)  Admission diagnosis:  Hyponatremia [E87.1] COPD exacerbation (HCC) [J44.1] Acute on chronic respiratory failure with hypoxia (HCC) [J96.21] Acute congestive heart failure, unspecified heart failure type [facility]) [I50.9] Patient Active Problem List   Diagnosis Date Noted  . Acute on chronic respiratory failure with hypoxia and hypercapnia ([place]) 09/22/2019  . Pressure injury of skin 09/22/2019  . Labial infection 09/22/2019  . Acute exacerbation of CHF (congestive heart failure) ([place]) 09/22/2019  . Anemia 01/26/2018  . PVD (peripheral vascular disease) ([place]) 01/26/2018  . Ischemic ulcer diabetic foot ([place]) 01/26/2018  . Malnutrition of moderate degree 01/26/2018  . Critical limb ischemia with history of revascularization of same extremity 12/21/2017  . Hypomagnesemia 08/25/2017  . Bilateral back pain 08/24/2017  . Compression fracture of thoracic vertebra (HCC) 08/24/2017  . Hyponatremia 08/24/2017  . Hypokalemia 08/24/2017  . Fall 08/24/2017  . Microcytic anemia 08/24/2017  . Alcohol use 08/24/2017  . Depression 08/24/2017  . Dyslipidemia associated with type 2 diabetes mellitus ([place]) 04/28/2016  . Diabetes mellitus, type II, insulin dependent ([place]) 04/28/2016  . GERD without esophagitis 04/28/2016  . O2 dependent 04/28/2016  . Community acquired pneumonia   . Sepsis due to pneumonia ([place]) 04/18/2016  . COPD exacerbation ([place]) 12/04/2010  . Smoker 12/04/2010  . Essential hypertension 01/31/2008  . Chronic respiratory failure ([place]) 01/31/2008   PCP:  Lujean Amel, MD Pharmacy:   [facility] [address], [place] - [address] [place] [place] [address] Phone: [phone] Fax: [phone]     Social Determinants of Health (SDOH) Interventions    Readmission Risk Interventions No flowsheet data found.

## 2019-09-24 NOTE — Consult Note (Addendum)
WOC consult requested for bruises and stage 1 pressure injuries, all are present on admission.  Reviewed progress notes and nursing wound flow sheet. No topical treatment is indicated for these medical conditions. Please re-consult if further assistance is needed.  Thank-you,  Julien Girt MSN, [place], [place], [place], [place]

## 2019-09-24 NOTE — Progress Notes (Signed)
Placed patient on BIPAP per request from RN/patient for SOB, increased WOB.

## 2019-09-24 NOTE — Progress Notes (Signed)
Occupational Therapy Evaluation Patient Details Name: [name] MRN: [id number] DOB: 07/23/1945 Today's Date: 09/24/2019    History of Present Illness 74 year old female with HTN, HLD, chronic diastolic CHF, COPD on 4 L at home, DM 2, tobacco and alcohol abuse, came into the hospital with shortness of breath.  Patient tells me that she has been short of breath over the last month however it is gotten progressively worse, she could not take it and came to the hospital and she was admitted on 9/18.  She reports several falls recently with bruises to the right side of her face, shoulder and legs.  On arrival she was found on room air satting 84%, was placed on a nonrebreather.  She was admitted to the hospital and placed on BiPAP overnight   Clinical Impression   Pt presents with above diagnosis. PTA pt PLOF living at home alone, mostly mod I with ADLs and requires assistance from family with IADLs. Pt currently limited with safe ADL engagement due to decreased activity tolerance, pain, and instability. Pt will benefit from additional acute OT to address established deficits in strength, AE education, energy conservation techniques, and stability with functional tasks prior to dc to SNF.     Follow Up Recommendations  SNF    Equipment Recommendations       Recommendations for Other Services       Precautions / Restrictions Precautions Precautions: Fall Restrictions Weight Bearing Restrictions: No      Mobility Bed Mobility Overal bed mobility: Needs Assistance Bed Mobility: Supine to Sit     Supine to sit: Min assist     General bed mobility comments: 1 hand assist to elevate trunk. Pt able to present BLE to EOB.  Transfers                 General transfer comment: deferred due to increased HR.    Balance Overall balance assessment: Needs assistance Sitting-balance support: No upper extremity supported;Feet supported Sitting balance-Leahy Scale:  Fair Sitting balance - Comments: No assist needed to sit EOB.                                    ADL either performed or assessed with clinical judgement   ADL Overall ADL's : Needs assistance/impaired Eating/Feeding: Modified independent;Sitting;Bed level   Grooming: Wash/dry hands;Wash/dry face;Oral care;Set up;Sitting   Upper Body Bathing: Modified independent;Sitting   Lower Body Bathing: Min guard;Sitting/lateral leans   Upper Body Dressing : Modified independent;Sitting   Lower Body Dressing: Min guard;Sitting/lateral leans Lower Body Dressing Details (indicate cue type and reason): increased time required HR elevated once sitting EOB to don/doff sock               General ADL Comments: functional mobility deferred to elevation of HR at 128-134 while seated EOB reported to RN.     Vision         Perception     Praxis      Pertinent Vitals/Pain Pain Assessment: Faces Faces Pain Scale: Hurts little more Pain Location: bladder Pain Descriptors / Indicators: Aching;Discomfort Pain Intervention(s): Limited activity within patient's tolerance;Repositioned;Premedicated before session     Hand Dominance Right   Extremity/Trunk Assessment Upper Extremity Assessment Upper Extremity Assessment: Generalized weakness   Lower Extremity Assessment Lower Extremity Assessment: Defer to PT evaluation   Cervical / Trunk Assessment Cervical / Trunk Assessment: Kyphotic   Communication Communication Communication: No difficulties  Cognition Arousal/Alertness: Awake/alert Behavior During Therapy: WFL for tasks assessed/performed Overall Cognitive Status: Within Functional Limits for tasks assessed                                     General Comments  BPM reported above at ADL comment. Pt on 4L O2 93-97% O2 sats.     Exercises     Shoulder Instructions      Home Living Family/patient expects to be discharged to:: Private  residence Living Arrangements: Alone Available Help at Discharge: Family;Friend(s);Available PRN/intermittently Type of Home: House Home Access: Stairs to enter [employer] of Steps: 3 Entrance Stairs-Rails: Right;Left Home Layout: One level     Bathroom Shower/Tub: Occupational psychologist: Handicapped height (main bathroom)     Home Equipment: Environmental consultant - 4 wheels;Toilet riser;Wheelchair - [employer];Shower seat   Additional Comments: 4LO2 at home PTA, mostly mod I with ADLs, assist with IADLs support from family.       Prior Functioning/Environment Level of Independence: Independent with assistive device(s);Needs assistance        Comments: uses rollator in home, [facility] for long distances; brother available to assist prn        OT Problem List: Decreased strength;Decreased activity tolerance;Impaired balance (sitting and/or standing);Decreased safety awareness;Decreased knowledge of use of DME or AE;Pain      OT Treatment/Interventions: Self-care/ADL training;Therapeutic exercise;Energy conservation;DME and/or AE instruction;Therapeutic activities;Patient/family education;Balance training    OT Goals(Current goals can be found in the care plan section) Acute Rehab OT Goals Patient Stated Goal: to get better and stronger OT Goal Formulation: With patient Time For Goal Achievement: 10/08/19 Potential to Achieve Goals: Fair  OT Frequency: Min 2X/week   Barriers to D/C:            Co-evaluation              AM-PAC OT "6 Clicks" Daily Activity     Outcome Measure Help from another person eating meals?: None Help from another person taking care of personal grooming?: A Little Help from another person toileting, which includes using toliet, bedpan, or urinal?: A Little Help from another person bathing (including washing, rinsing, drying)?: A Little Help from another person to put on and taking off regular upper body clothing?: A  Little Help from another person to put on and taking off regular lower body clothing?: A Little 6 Click Score: 19   End of Session Equipment Utilized During Treatment: Oxygen  Activity Tolerance: Patient limited by fatigue;Patient limited by pain Patient left: in bed;with nursing/sitter in room;with bed alarm set;with call bell/phone within reach  OT Visit Diagnosis: Muscle weakness (generalized) (M62.81);History of falling (Z91.81);Pain;Unsteadiness on feet (R26.81) Pain - Right/Left: Right Pain - part of body: Leg                Time: 9323-5573 OT Time Calculation (min): 23 min Charges:  OT General Charges $OT Visit: 1 Visit OT Evaluation $OT Eval Low Complexity: 1 Low OT Treatments $Self Care/Home Management : 8-22 mins  [name], MSOT, OTR/L  Supplemental Rehabilitation Services  [phone]   Marius Ditch 09/24/2019, 11:55 AM

## 2019-09-24 NOTE — Progress Notes (Signed)
PROGRESS NOTE  [name] RCB:[id number] DOB: November 29, 1945 DOA: 09/22/2019 PCP: Lujean Amel, MD   LOS: 2 days   Brief Narrative / Interim history: 74 year old female with HTN, HLD, chronic diastolic CHF, COPD on 4 L at home, DM 2, tobacco and alcohol abuse, came into the hospital with shortness of breath.  Patient tells me that she has been short of breath over the last month however it is gotten progressively worse, she could not take it and came to the hospital and she was admitted on 9/18.  She reports several falls recently with bruises to the right side of her face, shoulder and legs.  On arrival she was found on room air satting 84%, was placed on a nonrebreather.  She was admitted to the hospital and placed on BiPAP overnight  Subjective / 24h Interval events: Appreciates her breathing has improved.  No chest pain, no abdominal pain, no nausea or vomiting  Assessment & Plan: Principal Problem Acute on chronic hypoxic and hypercapnic respiratory failure due to acute on chronic diastolic CHF with acute pulmonary edema, and COPD exacerbation -Patient has been apparently noncompliant with her home oxygen as she was found on room air.  Chest x-ray showed bilateral pleural effusions with edema, she has 1+ edema and BNP was elevated. -Patient will be treated with IV Lasix, nebulizers, steroids for COPD exacerbation along with ceftriaxone.   -2D echo was done on 9/18 showed an EF of 60-65% with grade 1 diastolic dysfunction -Continue Lasix, renal function is stable  Active Problems New onset A. Fib -developed last night, CHA2DS2-VASc score 6, started on anticoagulation with heparin.  Monitor for now, probably transition to oral agents tomorrow.  2D echo done 2 days ago  Acute hyponatremia -suspect hypervolemic hyponatremia, but also with her history of alcohol abuse may contribute -Sodium gradually improving, 124, continue to monitor, continue Lasix  Labial infection, acute    -Erythema in one of the left labia, exam done with RN buffering, no fluctuance or deep abscess noted.  She was started on ceftriaxone, continue  Right knee hematoma -Following a fall, monitor  Anemia of chronic disease -Monitor hemoglobin  Tobacco and alcohol use -Continue patch, continue CIWA.  She denies prior withdrawal symptoms  Diabetes mellitus type 2, controlled -SSI, CBG controlled  CBG (last 3)  Recent Labs    09/23/19 2112 09/23/19 2312 09/24/19 0609  GLUCAP 264* 179* 168*    Scheduled Meds: . arformoterol  15 mcg Nebulization BID  . atorvastatin  20 mg Oral QPM  . budesonide (PULMICORT) nebulizer solution  0.5 mg Nebulization BID  . folic acid  1 mg Oral Daily  . furosemide  40 mg Intravenous BID  . insulin aspart  0-9 Units Subcutaneous Q6H  . ipratropium-albuterol  3 mL Nebulization QID  . methylPREDNISolone (SOLU-MEDROL) injection  60 mg Intravenous BID  . multivitamin with minerals  1 tablet Oral Daily  . nicotine  21 mg Transdermal Daily  . nystatin   Topical BID  . pantoprazole (PROTONIX) IV  40 mg Intravenous Q24H  . sodium chloride flush  3 mL Intravenous Q12H  . thiamine  100 mg Oral Daily   Or  . thiamine  100 mg Intravenous Daily   Continuous Infusions: . sodium chloride    . cefTRIAXone (ROCEPHIN)  IV 2 g (09/23/19 1851)  . heparin 1,000 Units/hr (09/24/19 0540)   PRN Meds:.sodium chloride, acetaminophen, diazepam, ipratropium-albuterol, LORazepam **OR** LORazepam, metoprolol tartrate, ondansetron (ZOFRAN) IV, sodium chloride flush  Diet Orders (From  admission, onward)    Start     Ordered   09/24/19 0920  Diet heart healthy/carb modified Room service appropriate? Yes with Assist; Fluid consistency: Thin; Fluid restriction: 1500 mL Fluid  Diet effective now       Question Answer Comment  Diet-HS Snack? Nothing   Room service appropriate? Yes with Assist   Fluid consistency: Thin   Fluid restriction: 1500 mL Fluid      09/24/19 0919           DVT prophylaxis:     Code Status: Full Code  Family Communication: Discussed with brother [phone]  Status is: Inpatient  Remains inpatient appropriate because:Inpatient level of care appropriate due to severity of illness   Dispo: The patient is from: Home              Anticipated d/c is to: SNF              Anticipated d/c date is: 3 days              Patient currently is not medically stable to d/c.  Consultants:  None   Procedures:  2D echo  Microbiology  None   Antimicrobials: Ceftriaxone 9/18 >>    Objective: Vitals:   09/24/19 0355 09/24/19 0445 09/24/19 0733 09/24/19 0853  BP: (!) 121/56 123/61  118/67  Pulse:  (!) 107 95 100  Resp: (!) 22 19 19  (!) 21  Temp:  97.8 F (36.6 C)  97.6 F (36.4 C)  TempSrc:  Oral  Axillary  SpO2:  97% 98% 93%  Weight:  78.3 kg    Height:        Intake/Output Summary (Last 24 hours) at 09/24/2019 1127 Last data filed at 09/24/2019 0830 Gross per 24 hour  Intake 604.64 ml  Output 600 ml  Net 4.64 ml   Filed Weights   09/22/19 2330 09/23/19 0410 09/24/19 0445  Weight: 80.5 kg 80.5 kg 78.3 kg    Examination:  Constitutional: No distress, in bed Eyes: No scleral icterus ENMT: Moist mucous membranes Neck: normal, supple Respiratory: Coarse breath sounds bilaterally, faint end expiratory wheezing, diminished at the bases Cardiovascular: Irregularly irregular, no murmurs appreciated.  1+ pitting edema Abdomen: Soft, nontender, nondistended, bowel sounds positive Musculoskeletal: no clubbing / cyanosis.  Skin: Small ulceration surrounding erythema on left labia Neurologic: No focal deficits, equal strength   Data Reviewed: I have independently reviewed following labs and imaging studies   CBC: Recent Labs  Lab 09/22/19 0900 09/22/19 1007 09/23/19 0755 09/24/19 0315  WBC 8.0  --  6.7 5.8  NEUTROABS 7.3  --  6.3  --   HGB 9.0* 11.2* 8.9* 8.9*  HCT 29.4* 33.0* 29.3* 28.6*  MCV 82.8  --  83.0  83.4  PLT 243  --  228 568   Basic Metabolic Panel: Recent Labs  Lab 09/23/19 0138 09/23/19 0755 09/23/19 1806 09/23/19 2146 09/24/19 0315  NA 117* 121* 122* 123* 124*  K 4.4 4.5 4.1 4.2 4.1  CL 72* 75* 75* 77* 77*  CO2 32 34* 39* 34* 33*  GLUCOSE 103* 108* 137* 211* 156*  BUN 11 11 18 20 19   CREATININE 0.69 0.80 0.95 1.09* 0.87  CALCIUM 8.2* 8.4* 8.7* 8.6* 8.7*  MG  --  2.1  --   --   --   PHOS  --  5.0*  --   --   --    Liver Function Tests: Recent Labs  Lab 09/23/19 0755  AST  21  ALT 16  ALKPHOS 56  BILITOT 0.9  PROT 5.8*  ALBUMIN 3.2*   Coagulation Profile: No results for input(s): INR, PROTIME in the last 168 hours. HbA1C: Recent Labs    09/23/19 0138  HGBA1C 6.4*   CBG: Recent Labs  Lab 09/23/19 1231 09/23/19 1756 09/23/19 2112 09/23/19 2312 09/24/19 0609  GLUCAP 224* 132* 264* 179* 168*    Recent Results (from the past 240 hour(s))  SARS Coronavirus 2 by RT PCR (hospital order, performed in [facility] hospital lab) Nasopharyngeal Nasopharyngeal Swab     Status: None   Collection Time: 09/22/19  8:46 AM   Specimen: Nasopharyngeal Swab  Result Value Ref Range Status   SARS Coronavirus 2 NEGATIVE NEGATIVE Final    Comment: (NOTE) SARS-CoV-2 target nucleic acids are NOT DETECTED.  The SARS-CoV-2 RNA is generally detectable in upper and lower respiratory specimens during the acute phase of infection. The lowest concentration of SARS-CoV-2 viral copies this assay can detect is 250 copies / mL. A negative result does not preclude SARS-CoV-2 infection and should not be used as the sole basis for treatment or other patient management decisions.  A negative result may occur with improper specimen collection / handling, submission of specimen other than nasopharyngeal swab, presence of viral mutation(s) within the areas targeted by this assay, and inadequate number of viral copies (<250 copies / mL). A negative result must be combined with  clinical observations, patient history, and epidemiological information.  Fact Sheet for Patients:   StrictlyIdeas.no  Fact Sheet for Healthcare Providers: BankingDealers.co.za  This test is not yet approved or  cleared by the [place] FDA and has been authorized for detection and/or diagnosis of SARS-CoV-2 by FDA under an Emergency Use Authorization (EUA).  This EUA will remain in effect (meaning this test can be used) for the duration of the COVID-19 declaration under Section 564(b)(1) of the Act, 21 U.S.C. section 360bbb-3(b)(1), unless the authorization is terminated or revoked sooner.  Performed at [facility], [place] [address], [place], [place] [address]      Radiology Studies: No results found. Marzetta Board, MD, PhD [facility]  Between 7 am - 7 pm I am available, please contact me via Amion or Securechat  Between 7 pm - 7 am I am not available, please contact night coverage MD/APP via Amion

## 2019-09-24 NOTE — Progress Notes (Signed)
ANTICOAGULATION CONSULT NOTE - Initial Consult  Pharmacy Consult for heparin Indication: atrial fibrillation  No Known Allergies  Patient Measurements: Height: 5\' 8"  (172.7 cm) Weight: 78.3 kg (172 lb 9.9 oz) IBW/kg (Calculated) : 63.9  Vital Signs: Temp: 97.8 F (36.6 C) (09/20 0445) Temp Source: Oral (09/20 0445) BP: 123/61 (09/20 0445) Pulse Rate: 107 (09/20 0445)  Labs: Recent Labs    09/22/19 0900 09/22/19 0900 09/22/19 1007 09/22/19 1521 09/23/19 0755 09/23/19 1806 09/23/19 2146 09/24/19 0315  HGB 9.0*   < > 11.2*  --  8.9*  --   --  8.9*  HCT 29.4*   < > 33.0*  --  29.3*  --   --  28.6*  PLT 243  --   --   --  228  --   --  219  CREATININE 0.55  --   --    < > 0.80 0.95 1.09*  --    < > = values in this interval not displayed.    Estimated Creatinine Clearance: 50.6 mL/min (A) (by C-G formula based on SCr of 1.09 mg/dL (H)).   Medical History: Past Medical History:  Diagnosis Date  . Acute exacerbation of CHF (congestive heart failure) ([place]) 09/22/2019  . COPD (chronic obstructive pulmonary disease) ([place])   . Diabetes mellitus   . Diastolic dysfunction    history  . Hyperlipidemia   . Hypertension     Medications:  Medications Prior to Admission  Medication Sig Dispense Refill Last Dose  . amLODipine-valsartan (EXFORGE) 10-320 MG tablet Take 1 tablet by mouth daily.    UNK  . atorvastatin (LIPITOR) 20 MG tablet Take 20 mg by mouth every evening.   09/21/2019 at Unknown time  . diazepam (VALIUM) 5 MG tablet Take 1 tablet (5 mg total) by mouth every 12 (twelve) hours as needed for anxiety. 12 tablet 0 UNK  . empagliflozin (JARDIANCE) 10 MG TABS tablet Take 10 mg by mouth daily.   UNK  . ipratropium-albuterol (DUONEB) 0.5-2.5 (3) MG/3ML SOLN Take 3 mLs by nebulization every 4 (four) hours as needed. 360 mL  UNK  . metFORMIN (GLUCOPHAGE) 500 MG tablet Take 500 mg by mouth 2 (two) times daily with a meal.   09/21/2019 at Unknown time  . metoprolol succinate  (TOPROL-XL) 25 MG 24 hr tablet Take 25 mg by mouth daily.   UNK  . Multiple Vitamin (MULTIVITAMIN WITH MINERALS) TABS tablet Take 1 tablet by mouth daily.   UNK  . omeprazole (PRILOSEC) 20 MG capsule Take 1 tablet by mouth daily.   UNK  . PARoxetine (PAXIL) 20 MG tablet Take 20 mg by mouth daily.    UNK  . SYMBICORT 160-4.5 MCG/ACT inhaler Inhale 2 puffs into the lungs 2 (two) times daily.  0 UNK  . Amino Acids-Protein Hydrolys (FEEDING SUPPLEMENT, PRO-STAT SUGAR FREE 64,) LIQD Take 30 mLs by mouth 2 (two) times daily. (Patient not taking: Reported on 09/22/2019) 887 mL 0 Not Taking at Unknown time  . aspirin EC 81 MG tablet Take 81 mg by mouth daily.     . feeding supplement, ENSURE ENLIVE, (ENSURE ENLIVE) LIQD Take 237 mLs by mouth 2 (two) times daily between meals. (Patient not taking: Reported on 09/22/2019) 237 mL 12 Not Taking at Unknown time  . ferrous sulfate 325 (65 FE) MG tablet Take 1 tablet (325 mg total) by mouth 2 (two) times daily with a meal. (Patient not taking: Reported on 09/22/2019)  3 Not Taking at Unknown time  .  methocarbamol (ROBAXIN) 500 MG tablet Take 1 tablet (500 mg total) by mouth every 8 (eight) hours as needed for muscle spasms. (Patient not taking: Reported on 09/22/2019) 30 tablet 0 Not Taking at Unknown time  . silver sulfADIAZINE (SILVADENE) 1 % cream Apply 1 application topically daily. (Patient not taking: Reported on 09/22/2019) 50 g 0 Not Taking at Unknown time   Scheduled:  . arformoterol  15 mcg Nebulization BID  . atorvastatin  20 mg Oral QPM  . budesonide (PULMICORT) nebulizer solution  0.5 mg Nebulization BID  . enoxaparin (LOVENOX) injection  40 mg Subcutaneous Daily  . folic acid  1 mg Oral Daily  . furosemide  40 mg Intravenous BID  . insulin aspart  0-9 Units Subcutaneous Q6H  . ipratropium-albuterol  3 mL Nebulization QID  . methylPREDNISolone (SOLU-MEDROL) injection  60 mg Intravenous BID  . multivitamin with minerals  1 tablet Oral Daily  .  nicotine  21 mg Transdermal Daily  . nystatin   Topical BID  . pantoprazole (PROTONIX) IV  40 mg Intravenous Q24H  . sodium chloride flush  3 mL Intravenous Q12H  . thiamine  100 mg Oral Daily   Or  . thiamine  100 mg Intravenous Daily   Infusions:  . sodium chloride    . cefTRIAXone (ROCEPHIN)  IV 2 g (09/23/19 1851)    Assessment: 74yo female admitted w/ acute on chronic CHF, had been tachycardic overnight and now showing Afib on monitor, to begin heparin.  Goal of Therapy:  Heparin level 0.3-0.7 units/ml Monitor platelets by anticoagulation protocol: Yes   Plan:  Rec'd DVT Px LMWH at 10p; will give small heparin bolus of 2000 units and start heparin gtt at 1000 units/hr and monitor heparin levels and CBC.  Wynona Neat, PharmD, BCPS  09/24/2019,5:22 AM

## 2019-09-24 NOTE — Progress Notes (Signed)
Received pt.with irregular HR. -110-120;MD on call made aware ordered EKG & relayed to her the results & new orders received.

## 2019-09-24 NOTE — NC FL2 (Signed)
[place] LEVEL OF CARE SCREENING TOOL     IDENTIFICATION  Patient Name: [name] Birthdate: [birth year] Sex: female Admission Date (Current Location): 09/22/2019  [facility] and [place] Number:  Herbalist and Address:  [facility], [place] [address] [place],  [address]      Provider Number: [id number]  Attending Physician Name and Address:  Caren Griffins, MD  Relative Name and Phone Number:  Gordan Payment [phone]    Current Level of Care: Hospital Recommended Level of Care: [place] Prior Approval Number:    Date Approved/Denied:   PASRR Number: [id number]  Discharge Plan: SNF    Current Diagnoses: Patient Active Problem List   Diagnosis Date Noted  . Acute on chronic respiratory failure with hypoxia and hypercapnia ([place]) 09/22/2019  . Pressure injury of skin 09/22/2019  . Labial infection 09/22/2019  . Acute exacerbation of CHF (congestive heart failure) ([place]) 09/22/2019  . Anemia 01/26/2018  . PVD (peripheral vascular disease) ([place]) 01/26/2018  . Ischemic ulcer diabetic foot ([place]) 01/26/2018  . Malnutrition of moderate degree 01/26/2018  . Critical limb ischemia with history of revascularization of same extremity 12/21/2017  . Hypomagnesemia 08/25/2017  . Bilateral back pain 08/24/2017  . Compression fracture of thoracic vertebra (HCC) 08/24/2017  . Hyponatremia 08/24/2017  . Hypokalemia 08/24/2017  . Fall 08/24/2017  . Microcytic anemia 08/24/2017  . Alcohol use 08/24/2017  . Depression 08/24/2017  . Dyslipidemia associated with type 2 diabetes mellitus ([place]) 04/28/2016  . Diabetes mellitus, type II, insulin dependent ([place]) 04/28/2016  . GERD without esophagitis 04/28/2016  . O2 dependent 04/28/2016  . Community acquired pneumonia   . Sepsis due to pneumonia ([place]) 04/18/2016  . COPD exacerbation ([place]) 12/04/2010  . Smoker 12/04/2010  . Essential hypertension 01/31/2008  .  Chronic respiratory failure ([place]) 01/31/2008    Orientation RESPIRATION BLADDER Height & Weight     Self, Time, Situation, Place  O2 (Nasal Cannula 4 liters) Incontinent, External catheter (External Urinary Catheter) Weight: 172 lb 9.9 oz (78.3 kg) Height:  5\' 8"  (172.7 cm)  BEHAVIORAL SYMPTOMS/MOOD NEUROLOGICAL BOWEL NUTRITION STATUS      Continent (WDL) Diet (See Discharge Summary)  AMBULATORY STATUS COMMUNICATION OF NEEDS Skin   Limited Assist Verbally Surgical wounds, Skin abrasions, Other (Comment) (rash groin PI stage 1 on knee and ankle and heel,wound on pretibial,wound on breast and groin;perineum and labia)                       Personal Care Assistance Level of Assistance  Bathing, Feeding, Dressing Bathing Assistance: Limited assistance Feeding assistance: Limited assistance (Needs Assist;needs help sitting up; Cardiac Carb modified) Dressing Assistance: Limited assistance     Functional Limitations Info  Sight, Hearing, Speech Sight Info: Adequate Hearing Info: Adequate Speech Info: Adequate    SPECIAL CARE FACTORS FREQUENCY  PT (By licensed PT), OT (By licensed OT)     PT Frequency: 5x min weekly OT Frequency: 5x min weekly            Contractures Contractures Info: Not present    Additional Factors Info  Code Status, Allergies, Insulin Sliding Scale Code Status Info: FULL Allergies Info: No known Allergies   Insulin Sliding Scale Info: insulin aspart (novoLOG) injection 0-9 Units every 6 hours       Current Medications (09/24/2019):  This is the current hospital active medication list Current Facility-Administered Medications  Medication Dose Route Frequency Provider Last Rate  Last Admin  . 0.9 %  sodium chloride infusion  250 mL Intravenous PRN Fuller Plan A, MD      . acetaminophen (TYLENOL) tablet 650 mg  650 mg Oral Q4H PRN Smith, Rondell A, MD      . arformoterol (BROVANA) nebulizer solution 15 mcg  15 mcg Nebulization BID Fuller Plan  A, MD   15 mcg at 09/24/19 0733  . atorvastatin (LIPITOR) tablet 20 mg  20 mg Oral QPM Smith, Rondell A, MD   20 mg at 09/23/19 1852  . budesonide (PULMICORT) nebulizer solution 0.5 mg  0.5 mg Nebulization BID Fuller Plan A, MD   0.5 mg at 09/24/19 0733  . cefTRIAXone (ROCEPHIN) 2 g in sodium chloride 0.9 % 100 mL IVPB  2 g Intravenous Q24H Smith, Rondell A, MD 200 mL/hr at 09/23/19 1851 2 g at 09/23/19 1851  . diazepam (VALIUM) tablet 5 mg  5 mg Oral Q12H PRN Fuller Plan A, MD      . folic acid (FOLVITE) tablet 1 mg  1 mg Oral Daily Smith, Rondell A, MD   1 mg at 09/24/19 1116  . furosemide (LASIX) injection 40 mg  40 mg Intravenous BID Fuller Plan A, MD   40 mg at 09/24/19 0804  . heparin ADULT infusion 100 units/mL (25000 units/240mL sodium chloride 0.45%)  1,000 Units/hr Intravenous Continuous Laren Everts, RPH 10 mL/hr at 09/24/19 0540 1,000 Units/hr at 09/24/19 0540  . insulin aspart (novoLOG) injection 0-9 Units  0-9 Units Subcutaneous Q6H Fuller Plan A, MD   2 Units at 09/24/19 1222  . ipratropium-albuterol (DUONEB) 0.5-2.5 (3) MG/3ML nebulizer solution 3 mL  3 mL Nebulization Q4H PRN Smith, Rondell A, MD      . ipratropium-albuterol (DUONEB) 0.5-2.5 (3) MG/3ML nebulizer solution 3 mL  3 mL Nebulization QID Smith, Rondell A, MD   3 mL at 09/24/19 1158  . LORazepam (ATIVAN) tablet 1-4 mg  1-4 mg Oral Q1H PRN Fuller Plan A, MD   1 mg at 09/24/19 1117   Or  . LORazepam (ATIVAN) injection 1-4 mg  1-4 mg Intravenous Q1H PRN Smith, Rondell A, MD      . methylPREDNISolone sodium succinate (SOLU-MEDROL) 125 mg/2 mL injection 60 mg  60 mg Intravenous BID Tamala Julian, Rondell A, MD   60 mg at 09/24/19 1122  . metoprolol tartrate (LOPRESSOR) injection 5 mg  5 mg Intravenous Q6H PRN Shela Leff, MD   5 mg at 09/24/19 0804  . metoprolol tartrate (LOPRESSOR) tablet 50 mg  50 mg Oral BID Caren Griffins, MD      . multivitamin with minerals tablet 1 tablet  1 tablet Oral Daily Tamala Julian,  Rondell A, MD   1 tablet at 09/24/19 1116  . nicotine (NICODERM CQ - dosed in mg/24 hours) patch 21 mg  21 mg Transdermal Daily Fuller Plan A, MD   21 mg at 09/23/19 0952  . nystatin (MYCOSTATIN/NYSTOP) topical powder   Topical BID Norval Morton, MD   Given at 09/24/19 0915  . ondansetron (ZOFRAN) injection 4 mg  4 mg Intravenous Q6H PRN Smith, Rondell A, MD      . pantoprazole (PROTONIX) injection 40 mg  40 mg Intravenous Q24H Smith, Rondell A, MD   40 mg at 09/23/19 1851  . sodium chloride flush (NS) 0.9 % injection 3 mL  3 mL Intravenous Q12H Smith, Rondell A, MD   3 mL at 09/24/19 1132  . sodium chloride flush (NS) 0.9 % injection 3  mL  3 mL Intravenous PRN Fuller Plan A, MD      . thiamine tablet 100 mg  100 mg Oral Daily Tamala Julian, Rondell A, MD   100 mg at 09/24/19 1115   Or  . thiamine (B-1) injection 100 mg  100 mg Intravenous Daily Fuller Plan A, MD   100 mg at 09/22/19 1847     Discharge Medications: Please see discharge summary for a list of discharge medications.  Relevant Imaging Results:  Relevant Lab Results:   Additional Information [id number]  Trula Ore, LCSWA

## 2019-09-25 LAB — GLUCOSE, CAPILLARY
Glucose-Capillary: 163 mg/dL — ABNORMAL HIGH (ref 70–99)
Glucose-Capillary: 183 mg/dL — ABNORMAL HIGH (ref 70–99)
Glucose-Capillary: 193 mg/dL — ABNORMAL HIGH (ref 70–99)
Glucose-Capillary: 226 mg/dL — ABNORMAL HIGH (ref 70–99)

## 2019-09-25 LAB — BASIC METABOLIC PANEL
Anion gap: 9 (ref 5–15)
BUN: 25 mg/dL — ABNORMAL HIGH (ref 8–23)
CO2: 39 mmol/L — ABNORMAL HIGH (ref 22–32)
Calcium: 9 mg/dL (ref 8.9–10.3)
Chloride: 80 mmol/L — ABNORMAL LOW (ref 98–111)
Creatinine, Ser: 0.78 mg/dL (ref 0.44–1.00)
GFR calc Af Amer: >60 mL/min (ref >60)
GFR calc non Af Amer: >60 mL/min (ref >60)
Glucose, Bld: 145 mg/dL — ABNORMAL HIGH (ref 70–99)
Potassium: 4.1 mmol/L (ref 3.5–5.1)
Sodium: 128 mmol/L — ABNORMAL LOW (ref 135–145)

## 2019-09-25 LAB — CBC
HCT: 28.8 % — ABNORMAL LOW (ref 36.0–46.0)
Hemoglobin: 8.8 g/dL — ABNORMAL LOW (ref 12.0–15.0)
MCH: 26.3 pg (ref 26.0–34.0)
MCHC: 30.6 g/dL (ref 30.0–36.0)
MCV: 86 fL (ref 80.0–100.0)
Platelets: 223 10*3/uL (ref 150–400)
RBC: 3.35 MIL/uL — ABNORMAL LOW (ref 3.87–5.11)
RDW: 15.4 % (ref 11.5–15.5)
WBC: 8.3 10*3/uL (ref 4.0–10.5)
nRBC: 0.4 % — ABNORMAL HIGH (ref 0.0–0.2)

## 2019-09-25 LAB — HEPARIN LEVEL (UNFRACTIONATED): Heparin Unfractionated: 0.66 IU/mL (ref 0.30–0.70)

## 2019-09-25 MED ORDER — ALUM & MAG HYDROXIDE-SIMETH 200-200-20 MG/5ML PO SUSP: 30.0000 mL | ORAL | Status: DC | PRN

## 2019-09-25 MED ORDER — APIXABAN 5 MG PO TABS
5.0000 mg | ORAL_TABLET | Freq: Two times a day (BID) | ORAL | Status: DC
  Administered 2019-09-25 – 2019-09-30 (×11): 5 mg via ORAL
  Filled 2019-09-25 (×11): qty 1

## 2019-09-25 NOTE — Progress Notes (Signed)
PROGRESS NOTE    [name]  VOZ:[id number] DOB: 25-May-1945 DOA: 09/22/2019 PCP: Lujean Amel, MD   Chief Complaint  Patient presents with  . Shortness of Breath  . COPD Exacerbation    Brief Narrative: 74 year old female with HTN, HLD, chronic diastolic CHF, COPD on 4 L at home, DM 2, tobacco and alcohol abuse, came into the hospital with shortness of breath.  Patient tells me that she has been short of breath over the last month however it is gotten progressively worse, she could not take it and came to the hospital and she was admitted on 9/18.  She reports several falls recently with bruises to the right side of her face, shoulder and legs.    Patient found to be hypoxic on 80% room air initially on nonrebreather, subsequently she was admitted with IV steroid IV Lasix with BiPAP overnight.    Subjective:  feelign less short of breath, no CP, fever or cough On 2-4 l [place] at home- now at [place]. Used bipap overnight, thinks she has one at home and uses it. Lives alone purewick in place Has not gotten up from bed, may be want to work with pt today she says On heparin gtt for a fib, in NSR now. Sodium up at 128, bun/creat stable Wt 177>>169 lb now  Assessment & Plan:  Acute on chronic respiratory failure with hypoxia and hypercapnia due to COPD exacerbation and acute on chronic diastolic CHF exacerbation: Continue nasal cannula oxygen, BiPAP bedtime, steroids Lasix nebulizers pulmonary.  Monitor intake output and daily weight.  2D echo from 9/18 showed EF 60 to 65% with grade 1 diastolic dysfunction.  Monitor renal function.  Apparently she is noncommittal with her home oxygen as she was found on room air.  Acute COPD exacerbation patient is having significant wheezing.  We will keep her on antibiotic bronchodilators and IV steroids.  Continue pulmonary support.  New onset atrial fibrillation, PAF: ONSET 9/19 night-with CHA2DS2-VASc score of 6 she was placed on IV heparin infusion.  Echo unremarkable.  Check TSH. In NSR now.  Discussed about risk benefits and alternatives of anticoagulation agrees for DOAC, will consult pharmacy to switch to Eliquis.  Continue metoprolol 50 mg twice daily.  Essential hypertension: BP well controlled continue metoprolol.  HLD- Cont lipitor  Hyponatremia: Suspecting hypervolemic hyponatremia and also complicated by alcohol abuse.  Sodium improving with diuresis.  Continue Lasix and repeat BMP daily. Recent Labs  Lab 09/23/19 0755 09/23/19 1806 09/23/19 2146 09/24/19 0315 09/25/19 0239  NA 121* 122* 123* 124* 128*   Labial infection-erythema noted on the left labia no fluctuance or deep abscess noted she was started on ceftriaxone continue the same.  Monitor temperature curve and WBC count. Recent Labs  Lab 09/22/19 0900 09/23/19 0755 09/24/19 0315 09/25/19 0239  WBC 8.0 6.7 5.8 8.3   Right knee hematoma secondary to fall.  Monitor.  She has a skin bump on the area below the patella  Anemia of chronic disease monitor hemoglobin.  Appears to be downtrending but close to 8 to 9 g range. Recent Labs  Lab 09/22/19 0900 09/22/19 1007 09/23/19 0755 09/24/19 0315 09/25/19 0239  HGB 9.0* 11.2* 8.9* 8.9* 8.8*  HCT 29.4* 33.0* 29.3* 28.6* 28.8*   Alcohol use/tobacco use: Continue CIWA scale, continue nicotine patch patient denies prior withdrawal symptoms.  Diabetes mellitus type II, hemoglobin A1c stable 6.4. Recent Labs  Lab 09/24/19 1141 09/24/19 1819 09/25/19 0006 09/25/19 0551 09/25/19 1145  GLUCAP 164* 167* 193*  163* 226*    DVT prophylaxis: heparin gtt Code Status:   Code Status: Full Code  Family Communication: plan of care discussed with patient at bedside.  Status is: Inpatient Remains inpatient appropriate because:IV treatments appropriate due to intensity of illness or inability to take PO, Inpatient level of care appropriate due to severity of illness and For ongoing management of hypoxia, CHF and COPD  exacerbation.  Dispo: The patient is from: Home              Anticipated d/c is to: SNF              Anticipated d/c date is: 2 days              Patient currently is not medically stable to d/c. Nutrition: Diet Order            Diet heart healthy/carb modified Room service appropriate? Yes with Assist; Fluid consistency: Thin; Fluid restriction: 1500 mL Fluid  Diet effective now                 Body mass index is 25.74 kg/m. Pressure Ulcer: Pressure Injury 09/22/19 Knee Anterior;Right Stage 1 -  Intact skin with non-blanchable redness of a localized area usually over a bony prominence. (Active)  09/22/19 1530  Location: Knee  Location Orientation: Anterior;Right  Staging: Stage 1 -  Intact skin with non-blanchable redness of a localized area usually over a bony prominence.  Wound Description (Comments):   Present on Admission: Yes     Pressure Injury 09/22/19 Ankle Right;Lateral Stage 1 -  Intact skin with non-blanchable redness of a localized area usually over a bony prominence. (Active)  09/22/19 1530  Location: Ankle  Location Orientation: Right;Lateral  Staging: Stage 1 -  Intact skin with non-blanchable redness of a localized area usually over a bony prominence.  Wound Description (Comments):   Present on Admission: Yes     Pressure Injury 09/22/19 Heel Left Stage 1 -  Intact skin with non-blanchable redness of a localized area usually over a bony prominence. (Active)  09/22/19 1530  Location: Heel  Location Orientation: Left  Staging: Stage 1 -  Intact skin with non-blanchable redness of a localized area usually over a bony prominence.  Wound Description (Comments):   Present on Admission: Yes    Consultants:see note  Procedures:see note Microbiology:see note Blood Culture    Component Value Date/Time   SDES BLOOD RIGHT ANTECUBITAL 04/18/2016 1640   SPECREQUEST  04/18/2016 1640    BOTTLES DRAWN AEROBIC AND ANAEROBIC Blood Culture adequate volume   CULT NO  GROWTH 6 DAYS 04/18/2016 1640   REPTSTATUS 04/24/2016 FINAL 04/18/2016 1640    Other culture-see note  Medications: Scheduled Meds: . arformoterol  15 mcg Nebulization BID  . atorvastatin  20 mg Oral QPM  . budesonide (PULMICORT) nebulizer solution  0.5 mg Nebulization BID  . folic acid  1 mg Oral Daily  . furosemide  40 mg Intravenous BID  . insulin aspart  0-9 Units Subcutaneous Q6H  . ipratropium-albuterol  3 mL Nebulization QID  . methylPREDNISolone (SOLU-MEDROL) injection  60 mg Intravenous BID  . metoprolol tartrate  50 mg Oral BID  . multivitamin with minerals  1 tablet Oral Daily  . nicotine  21 mg Transdermal Daily  . nystatin   Topical BID  . pantoprazole (PROTONIX) IV  40 mg Intravenous Q24H  . sodium chloride flush  3 mL Intravenous Q12H  . thiamine  100 mg Oral Daily   Continuous  Infusions: . sodium chloride    . cefTRIAXone (ROCEPHIN)  IV 2 g (09/24/19 1745)  . heparin 1,000 Units/hr (09/25/19 0343)    Antimicrobials: Anti-infectives (From admission, onward)   Start     Dose/Rate Route Frequency Ordered Stop   09/22/19 1800  cefTRIAXone (ROCEPHIN) 2 g in sodium chloride 0.9 % 100 mL IVPB        2 g 200 mL/hr over 30 Minutes Intravenous Every 24 hours 09/22/19 1736       Objective: Vitals: Today's Vitals   09/25/19 0643 09/25/19 0739 09/25/19 0740 09/25/19 0741  BP: (!) 132/56     Pulse: 76     Resp: (!) 22     Temp: 97.7 F (36.5 C)     TempSrc: Oral     SpO2: 99% 97% 97%   Weight:      Height:      PainSc:    Asleep    Intake/Output Summary (Last 24 hours) at 09/25/2019 0825 Last data filed at 09/24/2019 2149 Gross per 24 hour  Intake 236 ml  Output 550 ml  Net -314 ml   Filed Weights   09/23/19 0410 09/24/19 0445 09/25/19 0556  Weight: 80.5 kg 78.3 kg 76.8 kg   Weight change: -1.5 kg  Intake/Output from previous day: 09/20 0701 - 09/21 0700 In: 236 [P.O.:236] Out: 550 [Urine:550] Intake/Output this shift: No intake/output data  recorded.  Examination: General exam: AAOx3, on 4L [place] ,NAD, weak appearing. HEENT:Oral mucosa moist, Ear/Nose WNL grossly,dentition normal. Respiratory system: bilaterally wheezing,no use of accessory muscle, non tender. Cardiovascular system: S1 & S2 +, regular, No JVD. Gastrointestinal system: Abdomen soft, NT,ND, BS+. Nervous System:Alert, awake, moving extremities and grossly nonfocal Extremities:  Rt knee skin bump/swellingNo edema in legs, distal peripheral pulses palpable.  Skin: No rashes,no icterus. MSK: Normal muscle bulk,tone, power  Data Reviewed: I have personally reviewed following labs and imaging studies CBC: Recent Labs  Lab 09/22/19 0900 09/22/19 1007 09/23/19 0755 09/24/19 0315 09/25/19 0239  WBC 8.0  --  6.7 5.8 8.3  NEUTROABS 7.3  --  6.3  --   --   HGB 9.0* 11.2* 8.9* 8.9* 8.8*  HCT 29.4* 33.0* 29.3* 28.6* 28.8*  MCV 82.8  --  83.0 83.4 86.0  PLT 243  --  228 219 761   Basic Metabolic Panel: Recent Labs  Lab 09/23/19 0755 09/23/19 1806 09/23/19 2146 09/24/19 0315 09/25/19 0239  NA 121* 122* 123* 124* 128*  K 4.5 4.1 4.2 4.1 4.1  CL 75* 75* 77* 77* 80*  CO2 34* 39* 34* 33* 39*  GLUCOSE 108* 137* 211* 156* 145*  BUN 11 18 20 19  25*  CREATININE 0.80 0.95 1.09* 0.87 0.78  CALCIUM 8.4* 8.7* 8.6* 8.7* 9.0  MG 2.1  --   --   --   --   PHOS 5.0*  --   --   --   --    GFR: Estimated Creatinine Clearance: 68.3 mL/min (by C-G formula based on SCr of 0.78 mg/dL). Liver Function Tests: Recent Labs  Lab 09/23/19 0755  AST 21  ALT 16  ALKPHOS 56  BILITOT 0.9  PROT 5.8*  ALBUMIN 3.2*   No results for input(s): LIPASE, AMYLASE in the last 168 hours. No results for input(s): AMMONIA in the last 168 hours. Coagulation Profile: No results for input(s): INR, PROTIME in the last 168 hours. Cardiac Enzymes: No results for input(s): CKTOTAL, CKMB, CKMBINDEX, TROPONINI in the last 168 hours. BNP (last 3  results) No results for input(s): PROBNP in the  last 8760 hours. HbA1C: Recent Labs    09/23/19 0138  HGBA1C 6.4*   CBG: Recent Labs  Lab 09/24/19 0609 09/24/19 1141 09/24/19 1819 09/25/19 0006 09/25/19 0551  GLUCAP 168* 164* 167* 193* 163*   Lipid Profile: No results for input(s): CHOL, HDL, LDLCALC, TRIG, CHOLHDL, LDLDIRECT in the last 72 hours. Thyroid Function Tests: No results for input(s): TSH, T4TOTAL, FREET4, T3FREE, THYROIDAB in the last 72 hours. Anemia Panel: No results for input(s): VITAMINB12, FOLATE, FERRITIN, TIBC, IRON, RETICCTPCT in the last 72 hours. Sepsis Labs: No results for input(s): PROCALCITON, LATICACIDVEN in the last 168 hours.  Recent Results (from the past 240 hour(s))  SARS Coronavirus 2 by RT PCR (hospital order, performed in [facility] hospital lab) Nasopharyngeal Nasopharyngeal Swab     Status: None   Collection Time: 09/22/19  8:46 AM   Specimen: Nasopharyngeal Swab  Result Value Ref Range Status   SARS Coronavirus 2 NEGATIVE NEGATIVE Final    Comment: (NOTE) SARS-CoV-2 target nucleic acids are NOT DETECTED.  The SARS-CoV-2 RNA is generally detectable in upper and lower respiratory specimens during the acute phase of infection. The lowest concentration of SARS-CoV-2 viral copies this assay can detect is 250 copies / mL. A negative result does not preclude SARS-CoV-2 infection and should not be used as the sole basis for treatment or other patient management decisions.  A negative result may occur with improper specimen collection / handling, submission of specimen other than nasopharyngeal swab, presence of viral mutation(s) within the areas targeted by this assay, and inadequate number of viral copies (<250 copies / mL). A negative result must be combined with clinical observations, patient history, and epidemiological information.  Fact Sheet for Patients:   StrictlyIdeas.no  Fact Sheet for Healthcare  Providers: BankingDealers.co.za  This test is not yet approved or  cleared by the [place] FDA and has been authorized for detection and/or diagnosis of SARS-CoV-2 by FDA under an Emergency Use Authorization (EUA).  This EUA will remain in effect (meaning this test can be used) for the duration of the COVID-19 declaration under Section 564(b)(1) of the Act, 21 U.S.C. section 360bbb-3(b)(1), unless the authorization is terminated or revoked sooner.  Performed at [facility], [place] [address], [place],  [address]      Radiology Studies: No results found.   LOS: 3 days   Antonieta Pert, MD [facility]  09/25/2019, 8:25 AM

## 2019-09-25 NOTE — Progress Notes (Signed)
Pt.refused to put back BIPAP .Placed pt .on 4L [place].

## 2019-09-25 NOTE — TOC Benefit Eligibility Note (Signed)
Transition of Care [facility]) Benefit Eligibility Note    Patient Details  Name: [name] MRN: [id number] Date of Birth: August 10, 1945   Medication/Dose: Arne [place] 5 MG BID  Covered?: Yes  Tier: 3 Drug  Prescription Coverage Preferred Pharmacy: [facility] [place] with Person/Company/Phone Number:: [name]  @ [place] [facility] # [phone]  Co-Pay: $26.36  Prior Approval: No  Deductible: Met       Memory Argue Phone Number: 09/25/2019, 5:03 PM

## 2019-09-25 NOTE — Progress Notes (Signed)
Physical Therapy Treatment Patient Details Name: [name] MRN: [id number] DOB: 1945-01-30 Today's Date: 09/25/2019    History of Present Illness Pt is a 74 y.o. female admitted 09/22/19 with SOB and several falls. Pt found to be hypoxic requiring BiPAP. Workup for acute on chronic hypoxic and hypercapnic respiratory failure due to acute on chronic diastolic CHF with acute pulmonary edema, and COPD exacerbation. Pt also with new onset afib, L labial infection, R knee hematoma. PMH includes HTN, HLD, CHF, COPD (on 4L home O2), DM2, substance abuse.   PT Comments    Pt progressing with mobility. Able to perform multiple transfers from various surface heights and take steps with RW, requiring up to [facility] for mobility and assist for seated/standing ADL tasks. DOE 3/4 on 4L O2 [place] with minimal activity; SpO2 94%. Pt remains limited by generalized weakness, decreased activity tolerance and impaired balance strategies. Continue to recommend SNF-level therapies to maximize functional mobility and independence prior to return home; pt in agreement.    Follow Up Recommendations  SNF;Supervision/Assistance - 24 hour     Equipment Recommendations  None recommended by PT    Recommendations for Other Services       Precautions / Restrictions Precautions Precautions: Fall;Other (comment) Precaution Comments: Urinary urgency; L labial infection (painful with pericare); 4L O2 baseline Restrictions Weight Bearing Restrictions: No    Mobility  Bed Mobility Overal bed mobility: Needs Assistance Bed Mobility: Supine to Sit     Supine to sit: Min assist     General bed mobility comments: MinA for HHA to elevate trunk  Transfers Overall transfer level: Needs assistance Equipment used: Rolling walker (2 wheeled) Transfers: Sit to/from Stand Sit to Stand: Mod assist;Min assist;Min guard         General transfer comment: Initial modA for HHA to elevate trunk to RW from EOB; additional  [facility] and [facility]W, pt requiring minA to min guard for balance; heavy reliance on UE support; cues for hand placement  Ambulation/Gait Ambulation/Gait assistance: Min assist Gait Distance (Feet): 4 Feet Assistive device: Rolling walker (2 wheeled) Gait Pattern/deviations: Step-to pattern;Trunk flexed;Leaning posteriorly Gait velocity: Decreased   General Gait Details: Steps from bed to recliner, then recliner<>BSC, with RW and minA for stability; DOE 3/4 requiring seated rest. Pt declining further distance due to fatigue   Stairs             Wheelchair Mobility    Modified Rankin (Stroke Patients Only)       Balance Overall balance assessment: Needs assistance Sitting-balance support: No upper extremity supported;Feet supported Sitting balance-Leahy Scale: Fair       Standing balance-Leahy Scale: Poor Standing balance comment: Reliant on UE support requiring assist for posterior pericare                            Cognition Arousal/Alertness: Awake/alert Behavior During Therapy: WFL for tasks assessed/performed Overall Cognitive Status: Within Functional Limits for tasks assessed                                        Exercises      General Comments        Pertinent Vitals/Pain Pain Assessment: Faces Faces Pain Scale: Hurts little more Pain Location: Perianal area with pericare Pain Descriptors / Indicators: Grimacing;Guarding Pain Intervention(s): Monitored during session    Home Living  Prior Function            PT Goals (current goals can now be found in the care plan section) Progress towards PT goals: Progressing toward goals    Frequency    Min 2X/week      PT Plan Frequency needs to be updated    Co-evaluation              AM-PAC PT "6 Clicks" Mobility   Outcome Measure  Help needed turning from your back to your side while in a flat bed without using bedrails?:  A Little Help needed moving from lying on your back to sitting on the side of a flat bed without using bedrails?: A Little Help needed moving to and from a bed to a chair (including a wheelchair)?: A Little Help needed standing up from a chair using your arms (e.g., wheelchair or bedside chair)?: A Lot Help needed to walk in hospital room?: A Little Help needed climbing 3-5 steps with a railing? : A Lot 6 Click Score: 16    End of Session Equipment Utilized During Treatment: Oxygen Activity Tolerance: Patient tolerated treatment well;Patient limited by fatigue Patient left: in chair;with call bell/phone within reach;with chair alarm set Nurse Communication: Mobility status PT Visit Diagnosis: Unsteadiness on feet (R26.81);Muscle weakness (generalized) (M62.81)     Time: 6834-1962 PT Time Calculation (min) (ACUTE ONLY): 25 min  Charges:  $Therapeutic Activity: 23-37 mins                     Mabeline Caras, PT, DPT Acute Rehabilitation Services  Pager [phone] Office [phone]  Derry Lory 09/25/2019, 12:59 PM

## 2019-09-25 NOTE — Progress Notes (Addendum)
ANTICOAGULATION CONSULT NOTE  Pharmacy Consult for heparin Indication: atrial fibrillation  No Known Allergies  Patient Measurements: Height: 5\' 8"  (172.7 cm) Weight: 76.8 kg (169 lb 5 oz) IBW/kg (Calculated) : 63.9  Vital Signs: Temp: 97.7 F (36.5 C) (09/21 0643) Temp Source: Oral (09/21 0643) BP: 132/56 (09/21 0643) Pulse Rate: 76 (09/21 0643)  Labs: Recent Labs    09/23/19 0755 09/23/19 0755 09/23/19 1806 09/23/19 2146 09/24/19 0315 09/24/19 1345 09/25/19 0239  HGB 8.9*   < >  --   --  8.9*  --  8.8*  HCT 29.3*  --   --   --  28.6*  --  28.8*  PLT 228  --   --   --  219  --  223  HEPARINUNFRC  --   --   --   --   --  0.66 0.66  CREATININE 0.80  --    < > 1.09* 0.87  --  0.78   < > = values in this interval not displayed.    Estimated Creatinine Clearance: 68.3 mL/min (by C-G formula based on SCr of 0.78 mg/dL).   Medical History: Past Medical History:  Diagnosis Date  . Acute exacerbation of CHF (congestive heart failure) ([place]) 09/22/2019  . COPD (chronic obstructive pulmonary disease) ([place])   . Diabetes mellitus   . Diastolic dysfunction    history  . Hyperlipidemia   . Hypertension     Medications:  Medications Prior to Admission  Medication Sig Dispense Refill Last Dose  . amLODipine-valsartan (EXFORGE) 10-320 MG tablet Take 1 tablet by mouth daily.    UNK  . atorvastatin (LIPITOR) 20 MG tablet Take 20 mg by mouth every evening.   09/21/2019 at Unknown time  . diazepam (VALIUM) 5 MG tablet Take 1 tablet (5 mg total) by mouth every 12 (twelve) hours as needed for anxiety. 12 tablet 0 UNK  . empagliflozin (JARDIANCE) 10 MG TABS tablet Take 10 mg by mouth daily.   UNK  . ipratropium-albuterol (DUONEB) 0.5-2.5 (3) MG/3ML SOLN Take 3 mLs by nebulization every 4 (four) hours as needed. 360 mL  UNK  . metFORMIN (GLUCOPHAGE) 500 MG tablet Take 500 mg by mouth 2 (two) times daily with a meal.   09/21/2019 at Unknown time  . metoprolol succinate (TOPROL-XL) 25  MG 24 hr tablet Take 25 mg by mouth daily.   UNK  . Multiple Vitamin (MULTIVITAMIN WITH MINERALS) TABS tablet Take 1 tablet by mouth daily.   UNK  . omeprazole (PRILOSEC) 20 MG capsule Take 1 tablet by mouth daily.   UNK  . PARoxetine (PAXIL) 20 MG tablet Take 20 mg by mouth daily.    UNK  . SYMBICORT 160-4.5 MCG/ACT inhaler Inhale 2 puffs into the lungs 2 (two) times daily.  0 UNK  . Amino Acids-Protein Hydrolys (FEEDING SUPPLEMENT, PRO-STAT SUGAR FREE 64,) LIQD Take 30 mLs by mouth 2 (two) times daily. (Patient not taking: Reported on 09/22/2019) 887 mL 0 Not Taking at Unknown time  . aspirin EC 81 MG tablet Take 81 mg by mouth daily.     . feeding supplement, ENSURE ENLIVE, (ENSURE ENLIVE) LIQD Take 237 mLs by mouth 2 (two) times daily between meals. (Patient not taking: Reported on 09/22/2019) 237 mL 12 Not Taking at Unknown time  . ferrous sulfate 325 (65 FE) MG tablet Take 1 tablet (325 mg total) by mouth 2 (two) times daily with a meal. (Patient not taking: Reported on 09/22/2019)  3 Not Taking at  Unknown time  . methocarbamol (ROBAXIN) 500 MG tablet Take 1 tablet (500 mg total) by mouth every 8 (eight) hours as needed for muscle spasms. (Patient not taking: Reported on 09/22/2019) 30 tablet 0 Not Taking at Unknown time  . silver sulfADIAZINE (SILVADENE) 1 % cream Apply 1 application topically daily. (Patient not taking: Reported on 09/22/2019) 50 g 0 Not Taking at Unknown time   Scheduled:  . arformoterol  15 mcg Nebulization BID  . atorvastatin  20 mg Oral QPM  . budesonide (PULMICORT) nebulizer solution  0.5 mg Nebulization BID  . folic acid  1 mg Oral Daily  . furosemide  40 mg Intravenous BID  . insulin aspart  0-9 Units Subcutaneous Q6H  . ipratropium-albuterol  3 mL Nebulization QID  . methylPREDNISolone (SOLU-MEDROL) injection  60 mg Intravenous BID  . metoprolol tartrate  50 mg Oral BID  . multivitamin with minerals  1 tablet Oral Daily  . nicotine  21 mg Transdermal Daily  .  nystatin   Topical BID  . pantoprazole (PROTONIX) IV  40 mg Intravenous Q24H  . sodium chloride flush  3 mL Intravenous Q12H  . thiamine  100 mg Oral Daily   Or  . thiamine  100 mg Intravenous Daily   Infusions:  . sodium chloride    . cefTRIAXone (ROCEPHIN)  IV 2 g (09/24/19 1745)  . heparin 1,000 Units/hr (09/25/19 0343)    Assessment: 74yo female admitted w/ acute on chronic CHF, had been tachycardic overnight and now showing Afib on monitor, to begin heparin.  Heparin level remains therapeutic at 0.66, H/H stable.  Goal of Therapy:  Heparin level 0.3-0.7 units/ml Monitor platelets by anticoagulation protocol: Yes   Plan:  -Continue heparin 1000 units/h -Daily heparin level and CBC -F/U longterm OAC plan   ADDENDUM: pharmacy to convert IV heparin to apixaban. Wt >60k, age <73 yo, SCr < 1.  Plan: Stop heparin Begin apixaban 5mg  BID  Arrie Senate, PharmD, BCPS Clinical Pharmacist [phone] Please check AMION for all [place] numbers 09/25/2019

## 2019-09-25 NOTE — Progress Notes (Addendum)
Transitions of Care Pharmacist Note  [name] is a 75 y.o. female that has been diagnosed with PE and A Fib and will be prescribed Eliquis (apixaban) at discharge.   Patient Education: I provided the following education on Apixaban to the patient: How to take the medication Described what the medication is Signs of bleeding Signs/symptoms of VTE and stroke  Answered their questions  Thank you,   Norina Buzzard, PharmD PGY1 Pharmacy Resident 09/25/2019 7:56 PM  September 25, 2019

## 2019-09-25 NOTE — Progress Notes (Signed)
Pt in no distress at this time requiring bipap.  Pt will be trialed off for tonight but machine is at bedside in the event she may need it. Pt currently on 4LPM [place] with sat of 94%, RR 16-20, HR 70-75 BPM. RT will continue to monitor.

## 2019-09-25 NOTE — TOC Progression Note (Signed)
Transition of Care [facility]) - Progression Note    Patient Details  Name: NAESHA BUCKALEW MRN: [id number] Date of Birth: [birth year]  Transition of Care [facility]) CM/SW [place], [place] Phone Number: 09/25/2019, 12:35 PM  Clinical Narrative:     CSW spoke with patient at bedside.CSW provide patient with SNF bed offers.CSW discussed insurance authorization process and provided Medicare SNF ratings list. Patient chose SNF placement at [facility].CSW called Deedra with [facility] who confirmed that they can accept patient for SNF placement. Facility will start insurance authorization. No further questions reported at this time. CSW to continue to follow and assist with discharge planning needs.  Expected Discharge Plan: [place] Barriers to Discharge: Continued Medical Work up  Expected Discharge Plan and Services Expected Discharge Plan: [place] arrangements for the past 2 months: Single Family Home                                       Social Determinants of Health (SDOH) Interventions    Readmission Risk Interventions No flowsheet data found.

## 2019-09-26 LAB — BASIC METABOLIC PANEL
Anion gap: 12 (ref 5–15)
BUN: 26 mg/dL — ABNORMAL HIGH (ref 8–23)
CO2: 39 mmol/L — ABNORMAL HIGH (ref 22–32)
Calcium: 9 mg/dL (ref 8.9–10.3)
Chloride: 77 mmol/L — ABNORMAL LOW (ref 98–111)
Creatinine, Ser: 0.87 mg/dL (ref 0.44–1.00)
GFR calc Af Amer: >60 mL/min (ref >60)
GFR calc non Af Amer: >60 mL/min (ref >60)
Glucose, Bld: 190 mg/dL — ABNORMAL HIGH (ref 70–99)
Potassium: 3.9 mmol/L (ref 3.5–5.1)
Sodium: 128 mmol/L — ABNORMAL LOW (ref 135–145)

## 2019-09-26 LAB — CBC
HCT: 31.8 % — ABNORMAL LOW (ref 36.0–46.0)
Hemoglobin: 9.6 g/dL — ABNORMAL LOW (ref 12.0–15.0)
MCH: 26.1 pg (ref 26.0–34.0)
MCHC: 30.2 g/dL (ref 30.0–36.0)
MCV: 86.4 fL (ref 80.0–100.0)
Platelets: 177 10*3/uL (ref 150–400)
RBC: 3.68 MIL/uL — ABNORMAL LOW (ref 3.87–5.11)
RDW: 15.3 % (ref 11.5–15.5)
WBC: 9.3 10*3/uL (ref 4.0–10.5)
nRBC: 0.8 % — ABNORMAL HIGH (ref 0.0–0.2)

## 2019-09-26 LAB — GLUCOSE, CAPILLARY
Glucose-Capillary: 122 mg/dL — ABNORMAL HIGH (ref 70–99)
Glucose-Capillary: 163 mg/dL — ABNORMAL HIGH (ref 70–99)
Glucose-Capillary: 169 mg/dL — ABNORMAL HIGH (ref 70–99)
Glucose-Capillary: 175 mg/dL — ABNORMAL HIGH (ref 70–99)
Glucose-Capillary: 190 mg/dL — ABNORMAL HIGH (ref 70–99)

## 2019-09-26 LAB — TSH: TSH: 1.089 u[IU]/mL (ref 0.350–4.500)

## 2019-09-26 NOTE — Progress Notes (Signed)
Patient refused the use of BIPAP for the evening RT will continue to monitor.  

## 2019-09-26 NOTE — Progress Notes (Addendum)
Occupational Therapy Treatment Patient Details Name: [name] MRN: [id number] DOB: [birth year] Today's Date: 09/26/2019    History of present illness Pt is a 74 y.o. female admitted 09/22/19 with SOB and several falls. Pt found to be hypoxic requiring BiPAP. Workup for acute on chronic hypoxic and hypercapnic respiratory failure due to acute on chronic diastolic CHF with acute pulmonary edema, and COPD exacerbation. Pt also with new onset afib, L labial infection, R knee hematoma. PMH includes HTN, HLD, CHF, COPD (on 4L home O2), DM2, substance abuse.   OT comments  Pt received in bed, awake and alert, agreeable to session to address safety with ADLand use of EC strategies. Education of EC and breathing techniques given with handout, review with questions and answer to improve safety with ADL engagement in home environment. Pt receptive of information given, however, declines OOB activities at this. OT will continue to follow to incorporate learned techniques with ADL task to improve independence and engagement. DC and Freq remains the same.    Follow Up Recommendations  SNF    Equipment Recommendations       Recommendations for Other Services      Precautions / Restrictions Precautions Precautions: Fall;Other (comment) Precaution Comments: Urinary urgency; L labial infection (painful with pericare); 4L O2 baseline       Mobility Bed Mobility Overal bed mobility: Needs Assistance Bed Mobility: Supine to Sit           General bed mobility comments: declined OOB activity  Transfers Overall transfer level: Needs assistance Equipment used: Rolling walker (2 wheeled) Transfers: Sit to/from Stand                Balance Overall balance assessment: Needs assistance Sitting-balance support: No upper extremity supported;Feet supported Sitting balance-Leahy Scale: Fair Sitting balance - Comments: No assist needed to sit EOB.      Standing balance-Leahy Scale:  Poor Standing balance comment: Reliant on UE support requiring assist for posterior pericare                           ADL either performed or assessed with clinical judgement   ADL Overall ADL's : Needs assistance/impaired Eating/Feeding: Modified independent;Sitting;Bed level                                     General ADL Comments: Pt educated of energy conservation techniques with handout provided, review with questions and answers. Pt decline OOB activity at this time due to awaiting breakfast.      Vision       Perception     Praxis      Cognition Arousal/Alertness: Awake/alert Behavior During Therapy: WFL for tasks assessed/performed Overall Cognitive Status: Within Functional Limits for tasks assessed                                          Exercises     Shoulder Instructions       General Comments 4L 02 89-90% O2 sat    Pertinent Vitals/ Pain       Pain Assessment: No/denies pain Pain Location: Perianal area with pericare Pain Descriptors / Indicators: Grimacing;Guarding Pain Intervention(s): Monitored during session  Home Living  Prior Functioning/Environment              Frequency  Min 2X/week        Progress Toward Goals  OT Goals(current goals can now be found in the care plan section)  Progress towards OT goals: Progressing toward goals  Acute Rehab OT Goals Patient Stated Goal: to get better and stronger OT Goal Formulation: With patient Time For Goal Achievement: 10/08/19 Potential to Achieve Goals: Fair ADL Goals Pt Will Perform Grooming: with modified independence;sitting Pt Will Transfer to Toilet: with modified independence;bedside commode;ambulating  Plan Discharge plan remains appropriate;Frequency remains appropriate    Co-evaluation                 AM-PAC OT "6 Clicks" Daily Activity     Outcome Measure    Help from another person eating meals?: None Help from another person taking care of personal grooming?: A Little Help from another person toileting, which includes using toliet, bedpan, or urinal?: A Little Help from another person bathing (including washing, rinsing, drying)?: A Little Help from another person to put on and taking off regular upper body clothing?: A Little Help from another person to put on and taking off regular lower body clothing?: A Little 6 Click Score: 19    End of Session Equipment Utilized During Treatment: Oxygen  OT Visit Diagnosis: Muscle weakness (generalized) (M62.81);History of falling (Z91.81);Pain;Unsteadiness on feet (R26.81) Pain - Right/Left: Right Pain - part of body: Leg   Activity Tolerance Patient limited by fatigue;Patient limited by pain   Patient Left in bed;with bed alarm set;with call bell/phone within reach   Nurse Communication          Time: 4268-3419 OT Time Calculation (min): 16 min  Charges: OT General Charges $OT Visit: 1 Visit OT Treatments $Self Care/Home Management : 8-22 mins  Minus Breeding, MSOT, OTR/L  Supplemental Rehabilitation Services  [phone]    Marius Ditch 09/26/2019, 10:05 AM

## 2019-09-26 NOTE — Progress Notes (Signed)
PROGRESS NOTE    [name]  PIR:[id number] DOB: Nov 18, 1945 DOA: 09/22/2019 PCP: Lujean Amel, MD   Chief Complaint  Patient presents with  . Shortness of Breath  . COPD Exacerbation    Brief Narrative: 74 year old female with HTN, HLD, chronic diastolic CHF, COPD on 4 L at home, DM 2, tobacco and alcohol abuse, came into the hospital with shortness of breath.  Patient tells me that she has been short of breath over the last month however it is gotten progressively worse, she could not take it and came to the hospital and she was admitted on 9/18.  She reports several falls recently with bruises to the right side of her face, shoulder and legs.    Patient found to be hypoxic on 80% room air initially on nonrebreather, subsequently she was admitted with IV steroid IV Lasix with BiPAP overnight.    Subjective:  Tried off BIPAP overnight by RT ON 4L [place]-home setting. No fever, BP stable Labs pending in am. Feels better. Have not been up yet and does not know how she will feel she starts walking/ exerting.  Rt knee and rt leg bruised from fall Wt 177>>169 > 172  Assessment & Plan:  Acute on chronic respiratory failure with hypoxia and hypercapnia due to COPD exacerbation and acute on chronic diastolic CHF exacerbation: Overall much improved.  Continue on IV steroid given her significant wheezing still present.  Continue gentle cannula oxygen, off BiPAP during the night now.  Continue IV Lasix, nebs. Monitor intake output and daily weight.  2D echo from 9/18 showed EF 60 to 65% with grade 1 diastolic dysfunction.  Monitor renal function.  Apparently she is noncommittal with her home oxygen as she was found on room air.  Acute COPD exacerbation : Still with wheezing we will keep on IV steroid, nebulizer,. cont on ceftriaxone, increase activity with PT OT.    New onset atrial fibrillation, PAF: ONSET 9/19 night-with CHA2DS2-VASc score of 6 she was placed on IV heparin infusion. Echo  unremarkable.  Checked TSH and is normal. In NSR now.  Discussed about risk benefits and alternatives of anticoagulation agrees for DOAC and heparin drip has been switched to Eliquis.  Continue metoprolol 50 mg twice daily.  Essential hypertension: BP fairly controlled continue metoprolol.    HLD- Cont lipitor  Hyponatremia: Suspecting hypervolemic hyponatremia and also complicated by alcohol abuse.  Sodium has improved and pulling at 128.  Tolerating Lasix.  Monitor BMP daily.   Recent Labs  Lab 09/23/19 1806 09/23/19 2146 09/24/19 0315 09/25/19 0239 09/26/19 0951  NA 122* 123* 124* 128* 128*   Labial infection-erythema noted on the left labia no fluctuance or deep abscess noted she was started on ceftriaxone continue the same.  Monitor temperature curve and WBC count.  Recent Labs  Lab 09/22/19 0900 09/23/19 0755 09/24/19 0315 09/25/19 0239 09/26/19 0951  WBC 8.0 6.7 5.8 8.3 9.3   Right knee hematoma secondary to fall. Rt leg is bruised distally as well  She has a skin bump on the area below the patella . monitor.    Anemia of chronic disease monitor hemoglobin.  H&H is stable 9.6 g today. Recent Labs  Lab 09/22/19 1007 09/23/19 0755 09/24/19 0315 09/25/19 0239 09/26/19 0951  HGB 11.2* 8.9* 8.9* 8.8* 9.6*  HCT 33.0* 29.3* 28.6* 28.8* 31.8*   Alcohol use/tobacco use: Continue CIWA scale, continue nicotine patch patient denies prior withdrawal symptoms.  Overall is stable.  Has not been needing any Ativan.  Diabetes mellitus type II, hemoglobin A1c stable 6.4.  Blood sugar well controlled.  Monitor closely while on IV steroid.  Continue sliding scale. Recent Labs  Lab 09/25/19 0551 09/25/19 1145 09/25/19 1804 09/26/19 0008 09/26/19 0642  GLUCAP 163* 226* 183* 122* 169*    DVT prophylaxis: Eliquis Code Status:   Code Status: Full Code  Family Communication: plan of care discussed with patient at bedside.  Status is: Inpatient Remains inpatient appropriate  because:IV treatments appropriate due to intensity of illness or inability to take PO, Inpatient level of care appropriate due to severity of illness and For ongoing management of hypoxia, CHF and COPD exacerbation.  Dispo: The patient is from: Home              Anticipated d/c is to: SNF              Anticipated d/c date is: 2 days              Patient currently is not medically stable to d/c. Nutrition: Diet Order            Diet heart healthy/carb modified Room service appropriate? Yes with Assist; Fluid consistency: Thin; Fluid restriction: 1500 mL Fluid  Diet effective now                 Body mass index is 26.25 kg/m. Pressure Ulcer: Pressure Injury 09/22/19 Knee Anterior;Right Stage 1 -  Intact skin with non-blanchable redness of a localized area usually over a bony prominence. (Active)  09/22/19 1530  Location: Knee  Location Orientation: Anterior;Right  Staging: Stage 1 -  Intact skin with non-blanchable redness of a localized area usually over a bony prominence.  Wound Description (Comments):   Present on Admission: Yes     Pressure Injury 09/22/19 Ankle Right;Lateral Stage 1 -  Intact skin with non-blanchable redness of a localized area usually over a bony prominence. (Active)  09/22/19 1530  Location: Ankle  Location Orientation: Right;Lateral  Staging: Stage 1 -  Intact skin with non-blanchable redness of a localized area usually over a bony prominence.  Wound Description (Comments):   Present on Admission: Yes     Pressure Injury 09/22/19 Heel Left Stage 1 -  Intact skin with non-blanchable redness of a localized area usually over a bony prominence. (Active)  09/22/19 1530  Location: Heel  Location Orientation: Left  Staging: Stage 1 -  Intact skin with non-blanchable redness of a localized area usually over a bony prominence.  Wound Description (Comments):   Present on Admission: Yes    Consultants:see note  Procedures:see note Microbiology:see note Blood  Culture    Component Value Date/Time   SDES BLOOD RIGHT ANTECUBITAL 04/18/2016 1640   SPECREQUEST  04/18/2016 1640    BOTTLES DRAWN AEROBIC AND ANAEROBIC Blood Culture adequate volume   CULT NO GROWTH 6 DAYS 04/18/2016 1640   REPTSTATUS 04/24/2016 FINAL 04/18/2016 1640    Other culture-see note  Medications: Scheduled Meds: . apixaban  5 mg Oral BID  . arformoterol  15 mcg Nebulization BID  . atorvastatin  20 mg Oral QPM  . budesonide (PULMICORT) nebulizer solution  0.5 mg Nebulization BID  . folic acid  1 mg Oral Daily  . furosemide  40 mg Intravenous BID  . insulin aspart  0-9 Units Subcutaneous Q6H  . ipratropium-albuterol  3 mL Nebulization QID  . methylPREDNISolone (SOLU-MEDROL) injection  60 mg Intravenous BID  . metoprolol tartrate  50 mg Oral BID  . multivitamin with minerals  1 tablet Oral Daily  . nicotine  21 mg Transdermal Daily  . nystatin   Topical BID  . pantoprazole (PROTONIX) IV  40 mg Intravenous Q24H  . sodium chloride flush  3 mL Intravenous Q12H  . thiamine  100 mg Oral Daily   Continuous Infusions: . sodium chloride    . cefTRIAXone (ROCEPHIN)  IV 2 g (09/25/19 1533)    Antimicrobials: Anti-infectives (From admission, onward)   Start     Dose/Rate Route Frequency Ordered Stop   09/22/19 1800  cefTRIAXone (ROCEPHIN) 2 g in sodium chloride 0.9 % 100 mL IVPB        2 g 200 mL/hr over 30 Minutes Intravenous Every 24 hours 09/22/19 1736       Objective: Vitals: Today's Vitals   09/26/19 0456 09/26/19 0811 09/26/19 0822 09/26/19 1108  BP: (!) 143/43  (!) 154/59   Pulse: 60  64   Resp: 19  19   Temp: 98.4 F (36.9 C)  97.9 F (36.6 C)   TempSrc: Oral  Oral   SpO2: 94% 92% 90% 96%  Weight: 78.3 kg     Height:      PainSc:        Intake/Output Summary (Last 24 hours) at 09/26/2019 1205 Last data filed at 09/26/2019 1044 Gross per 24 hour  Intake 243 ml  Output 300 ml  Net -57 ml   Filed Weights   09/24/19 0445 09/25/19 0556 09/26/19 0456    Weight: 78.3 kg 76.8 kg 78.3 kg   Weight change: 1.5 kg  Intake/Output from previous day: 09/21 0701 - 09/22 0700 In: 480 [P.O.:480] Out: 300 [Urine:300] Intake/Output this shift: Total I/O In: 3 [I.V.:3] Out: -   Examination: General exam: AAOx3 , NAD, weak appearing. HEENT:Oral mucosa moist, Ear/Nose WNL grossly, dentition normal. Respiratory system: bilaterally significant wheezing present somewhat improved from yesterday, Cardiovascular system: S1 & S2 +, No JVD,. Gastrointestinal system: Abdomen soft, NT,ND, BS+ Nervous System:Alert, awake, moving extremities and grossly nonfocal Extremities: No edema, distal peripheral pulses palpable.  Skin: Bruise on the right leg with soft swelling in the right lower patellar area, MSK: Normal muscle bulk,tone, power  Data Reviewed: I have personally reviewed following labs and imaging studies CBC: Recent Labs  Lab 09/22/19 0900 09/22/19 0900 09/22/19 1007 09/23/19 0755 09/24/19 0315 09/25/19 0239 09/26/19 0951  WBC 8.0  --   --  6.7 5.8 8.3 9.3  NEUTROABS 7.3  --   --  6.3  --   --   --   HGB 9.0*   < > 11.2* 8.9* 8.9* 8.8* 9.6*  HCT 29.4*   < > 33.0* 29.3* 28.6* 28.8* 31.8*  MCV 82.8  --   --  83.0 83.4 86.0 86.4  PLT 243  --   --  228 219 223 177   < > = values in this interval not displayed.   Basic Metabolic Panel: Recent Labs  Lab 09/23/19 0755 09/23/19 0755 09/23/19 1806 09/23/19 2146 09/24/19 0315 09/25/19 0239 09/26/19 0951  NA 121*   < > 122* 123* 124* 128* 128*  K 4.5   < > 4.1 4.2 4.1 4.1 3.9  CL 75*   < > 75* 77* 77* 80* 77*  CO2 34*   < > 39* 34* 33* 39* 39*  GLUCOSE 108*   < > 137* 211* 156* 145* 190*  BUN 11   < > 18 20 19  25* 26*  CREATININE 0.80   < > 0.95 1.09* 0.87  0.78 0.87  CALCIUM 8.4*   < > 8.7* 8.6* 8.7* 9.0 9.0  MG 2.1  --   --   --   --   --   --   PHOS 5.0*  --   --   --   --   --   --    < > = values in this interval not displayed.   GFR: Estimated Creatinine Clearance: 63.4  mL/min (by C-G formula based on SCr of 0.87 mg/dL). Liver Function Tests: Recent Labs  Lab 09/23/19 0755  AST 21  ALT 16  ALKPHOS 56  BILITOT 0.9  PROT 5.8*  ALBUMIN 3.2*   No results for input(s): LIPASE, AMYLASE in the last 168 hours. No results for input(s): AMMONIA in the last 168 hours. Coagulation Profile: No results for input(s): INR, PROTIME in the last 168 hours. Cardiac Enzymes: No results for input(s): CKTOTAL, CKMB, CKMBINDEX, TROPONINI in the last 168 hours. BNP (last 3 results) No results for input(s): PROBNP in the last 8760 hours. HbA1C: No results for input(s): HGBA1C in the last 72 hours. CBG: Recent Labs  Lab 09/25/19 0551 09/25/19 1145 09/25/19 1804 09/26/19 0008 09/26/19 0642  GLUCAP 163* 226* 183* 122* 169*   Lipid Profile: No results for input(s): CHOL, HDL, LDLCALC, TRIG, CHOLHDL, LDLDIRECT in the last 72 hours. Thyroid Function Tests: Recent Labs    09/26/19 0951  TSH 1.089   Anemia Panel: No results for input(s): VITAMINB12, FOLATE, FERRITIN, TIBC, IRON, RETICCTPCT in the last 72 hours. Sepsis Labs: No results for input(s): PROCALCITON, LATICACIDVEN in the last 168 hours.  Recent Results (from the past 240 hour(s))  SARS Coronavirus 2 by RT PCR (hospital order, performed in [facility] hospital lab) Nasopharyngeal Nasopharyngeal Swab     Status: None   Collection Time: 09/22/19  8:46 AM   Specimen: Nasopharyngeal Swab  Result Value Ref Range Status   SARS Coronavirus 2 NEGATIVE NEGATIVE Final    Comment: (NOTE) SARS-CoV-2 target nucleic acids are NOT DETECTED.  The SARS-CoV-2 RNA is generally detectable in upper and lower respiratory specimens during the acute phase of infection. The lowest concentration of SARS-CoV-2 viral copies this assay can detect is 250 copies / mL. A negative result does not preclude SARS-CoV-2 infection and should not be used as the sole basis for treatment or other patient management decisions.  A negative  result may occur with improper specimen collection / handling, submission of specimen other than nasopharyngeal swab, presence of viral mutation(s) within the areas targeted by this assay, and inadequate number of viral copies (<250 copies / mL). A negative result must be combined with clinical observations, patient history, and epidemiological information.  Fact Sheet for Patients:   StrictlyIdeas.no  Fact Sheet for Healthcare Providers: BankingDealers.co.za  This test is not yet approved or  cleared by the [place] FDA and has been authorized for detection and/or diagnosis of SARS-CoV-2 by FDA under an Emergency Use Authorization (EUA).  This EUA will remain in effect (meaning this test can be used) for the duration of the COVID-19 declaration under Section 564(b)(1) of the Act, 21 U.S.C. section 360bbb-3(b)(1), unless the authorization is terminated or revoked sooner.  Performed at [facility], [place] [address] [place], [place] [address]      Radiology Studies: No results found.   LOS: 4 days   Antonieta Pert, MD [facility]  09/26/2019, 12:05 PM

## 2019-09-27 LAB — CBC
HCT: 31 % — ABNORMAL LOW (ref 36.0–46.0)
Hemoglobin: 9.3 g/dL — ABNORMAL LOW (ref 12.0–15.0)
MCH: 26.1 pg (ref 26.0–34.0)
MCHC: 30 g/dL (ref 30.0–36.0)
MCV: 87.1 fL (ref 80.0–100.0)
Platelets: 171 10*3/uL (ref 150–400)
RBC: 3.56 MIL/uL — ABNORMAL LOW (ref 3.87–5.11)
RDW: 15.4 % (ref 11.5–15.5)
WBC: 9.2 10*3/uL (ref 4.0–10.5)
nRBC: 0.2 % (ref 0.0–0.2)

## 2019-09-27 LAB — BASIC METABOLIC PANEL
Anion gap: 12 (ref 5–15)
BUN: 27 mg/dL — ABNORMAL HIGH (ref 8–23)
CO2: 38 mmol/L — ABNORMAL HIGH (ref 22–32)
Calcium: 9 mg/dL (ref 8.9–10.3)
Chloride: 79 mmol/L — ABNORMAL LOW (ref 98–111)
Creatinine, Ser: 0.76 mg/dL (ref 0.44–1.00)
GFR calc Af Amer: >60 mL/min (ref >60)
GFR calc non Af Amer: >60 mL/min (ref >60)
Glucose, Bld: 136 mg/dL — ABNORMAL HIGH (ref 70–99)
Potassium: 4 mmol/L (ref 3.5–5.1)
Sodium: 129 mmol/L — ABNORMAL LOW (ref 135–145)

## 2019-09-27 LAB — GLUCOSE, CAPILLARY
Glucose-Capillary: 176 mg/dL — ABNORMAL HIGH (ref 70–99)
Glucose-Capillary: 167 mg/dL — ABNORMAL HIGH (ref 70–99)
Glucose-Capillary: 137 mg/dL — ABNORMAL HIGH (ref 70–99)

## 2019-09-27 LAB — SARS CORONAVIRUS 2 (TAT 6-24 HRS): SARS Coronavirus 2: NEGATIVE

## 2019-09-27 MED ORDER — AMOXICILLIN-POT CLAVULANATE 875-125 MG PO TABS
1.0000 | ORAL_TABLET | Freq: Two times a day (BID) | ORAL | Status: AC
  Administered 2019-09-27 – 2019-09-29 (×5): 1 via ORAL
  Filled 2019-09-27 (×5): qty 1

## 2019-09-27 MED ORDER — IPRATROPIUM-ALBUTEROL 0.5-2.5 (3) MG/3ML IN SOLN
3.0000 mL | Freq: Three times a day (TID) | RESPIRATORY_TRACT | Status: DC
  Administered 2019-09-27 – 2019-09-30 (×9): 3 mL via RESPIRATORY_TRACT
  Filled 2019-09-27 (×9): qty 3

## 2019-09-27 NOTE — Progress Notes (Signed)
Patient refused use of BIPAP for the evening.  °

## 2019-09-27 NOTE — Care Management Important Message (Signed)
Important Message  Patient Details  Name: [name] MRN: [id number] Date of Birth: December 15, 1945   Medicare Important Message Given:  Yes     Shelda Altes 09/27/2019, 10:50 AM

## 2019-09-27 NOTE — Progress Notes (Signed)
PROGRESS NOTE    [name]  ENI:[id number] DOB: 04-20-1945 DOA: 09/22/2019 PCP: Lujean Amel, MD   Chief Complaint  Patient presents with  . Shortness of Breath  . COPD Exacerbation    Brief Narrative: 74 year old female with HTN, HLD, chronic diastolic CHF, COPD on 4 L at home, DM 2, tobacco and alcohol abuse, came into the hospital with shortness of breath.  Patient tells me that she has been short of breath over the last month however it is gotten progressively worse, she could not take it and came to the hospital and she was admitted on 9/18.  She reports several falls recently with bruises to the right side of her face, shoulder and legs.    Patient found to be hypoxic on 80% room air initially on nonrebreather, subsequently she was admitted with IV steroid IV Lasix with BiPAP overnight.   Patient was admitted and treated with iv lasix steroid nebulizer. Overall respiratory status improving.  Off bedtime BiPAP.  Symptoms became deconditioned anxious for fall seen by PT OT and has recommended skilled nursing facility. She continues to have bilateral extensive wheezing  Subjective:  She continues to have bilateral extensive wheezing But reports her breathing is much better.  On 4 L nasal cannula home setting. Did not use BiPAP last night. Rt knee and rt leg bruised from fall appearing somewhat darker. Wt 177>>169 > 172>176 lb  Assessment & Plan:  Acute on chronic respiratory failure with hypoxia and hypercapnia due to COPD exacerbation and acute on chronic diastolic CHF exacerbation: Respiratory status improving but he still having wheezing.  Continue on Lasix diuretics, steroids bronchodilators along with oxygen.2D echo from 9/18 showed EF 60 to 65% with grade 1 diastolic dysfunction.  Monitor renal function.  Apparently she is noncommittal with her home oxygen as she was found on room air.  Acute COPD exacerbation : Patient reports breathing is better but still having  significant wheezing.  I will keep her on IV steroids for next 24 hours and she is to p.o. we will try to ambulate her today and monitor pulse oximetry and her breathing status,discussed with nursing staff.  change antibiotics to p.o.  New onset atrial fibrillation, PAF: ONSET 9/19 night-with CHA2DS2-VASc score of 6 she was placed on IV heparin infusion. Echo unremarkable.  Checked TSH and is normal.  Normal sinus rhythm and on metoprolol 50 twice daily along with Eliquis for anticoagulation after discussing risk and benefits alternatives.   Essential hypertension: BP fairly controlled continue metoprolol.    HLD- Cont lipitor  Hyponatremia: Suspecting hypervolemic hyponatremia and also complicated by alcohol abuse.  Sodium slowly uptrending and overall stable.  Tolerating Lasix.  Recent Labs  Lab 09/23/19 2146 09/24/19 0315 09/25/19 0239 09/26/19 0951 09/27/19 0718  NA 123* 124* 128* 128* 129*   Labial infection-erythema noted on the left labia no fluctuance or deep abscess noted she was started on ceftriaxone-we will switch to p.o. antibiotics.   Recent Labs  Lab 09/23/19 0755 09/24/19 0315 09/25/19 0239 09/26/19 0951 09/27/19 0718  WBC 6.7 5.8 8.3 9.3 9.2   Right knee hematoma secondary to fall. Rt leg is bruised distally as well  She has a skin bump on the area below the patella . monitor.    Anemia of chronic disease monitor hemoglobin.  Hemoglobin is overall stable. Recent Labs  Lab 09/23/19 0755 09/24/19 0315 09/25/19 0239 09/26/19 0951 09/27/19 0718  HGB 8.9* 8.9* 8.8* 9.6* 9.3*  HCT 29.3* 28.6* 28.8* 31.8* 31.0*  Alcohol use/tobacco use: No signs of withdrawal continue multivitamins.    Diabetes mellitus type II, hemoglobin A1c stable 6.4.  Blood sugar is controlled on sliding scale.  Monitor while on steroid. Recent Labs  Lab 09/26/19 1257 09/26/19 1703 09/26/19 2329 09/27/19 0540 09/27/19 1228  GLUCAP 175* 163* 190* 176* 167*   DVT prophylaxis:  Eliquis Code Status:   Code Status: Full Code  Family Communication: plan of care discussed with patient at bedside.  Status is: Inpatient Remains inpatient appropriate because:IV treatments appropriate due to intensity of illness or inability to take PO, Inpatient level of care appropriate due to severity of illness and For ongoing management of hypoxia, CHF and COPD exacerbation.  Dispo: The patient is from: Home              Anticipated d/c is to: SNF              Anticipated d/c date is:1- 2 days              Patient currently is not medically stable to d/c. Nutrition: Diet Order            Diet heart healthy/carb modified Room service appropriate? Yes with Assist; Fluid consistency: Thin; Fluid restriction: 1500 mL Fluid  Diet effective now                 Body mass index is 26.85 kg/m. Pressure Ulcer: Pressure Injury 09/22/19 Knee Anterior;Right Stage 1 -  Intact skin with non-blanchable redness of a localized area usually over a bony prominence. (Active)  09/22/19 1530  Location: Knee  Location Orientation: Anterior;Right  Staging: Stage 1 -  Intact skin with non-blanchable redness of a localized area usually over a bony prominence.  Wound Description (Comments):   Present on Admission: Yes     Pressure Injury 09/22/19 Ankle Right;Lateral Stage 1 -  Intact skin with non-blanchable redness of a localized area usually over a bony prominence. (Active)  09/22/19 1530  Location: Ankle  Location Orientation: Right;Lateral  Staging: Stage 1 -  Intact skin with non-blanchable redness of a localized area usually over a bony prominence.  Wound Description (Comments):   Present on Admission: Yes     Pressure Injury 09/22/19 Heel Left Stage 1 -  Intact skin with non-blanchable redness of a localized area usually over a bony prominence. (Active)  09/22/19 1530  Location: Heel  Location Orientation: Left  Staging: Stage 1 -  Intact skin with non-blanchable redness of a localized  area usually over a bony prominence.  Wound Description (Comments):   Present on Admission: Yes    Consultants:see note  Procedures:see note Microbiology:see note Blood Culture    Component Value Date/Time   SDES BLOOD RIGHT ANTECUBITAL 04/18/2016 1640   SPECREQUEST  04/18/2016 1640    BOTTLES DRAWN AEROBIC AND ANAEROBIC Blood Culture adequate volume   CULT NO GROWTH 6 DAYS 04/18/2016 1640   REPTSTATUS 04/24/2016 FINAL 04/18/2016 1640    Other culture-see note  Medications: Scheduled Meds: . apixaban  5 mg Oral BID  . arformoterol  15 mcg Nebulization BID  . atorvastatin  20 mg Oral QPM  . budesonide (PULMICORT) nebulizer solution  0.5 mg Nebulization BID  . folic acid  1 mg Oral Daily  . furosemide  40 mg Intravenous BID  . insulin aspart  0-9 Units Subcutaneous Q6H  . ipratropium-albuterol  3 mL Nebulization TID  . methylPREDNISolone (SOLU-MEDROL) injection  60 mg Intravenous BID  . metoprolol tartrate  50 mg  Oral BID  . multivitamin with minerals  1 tablet Oral Daily  . nicotine  21 mg Transdermal Daily  . nystatin   Topical BID  . pantoprazole (PROTONIX) IV  40 mg Intravenous Q24H  . sodium chloride flush  3 mL Intravenous Q12H  . thiamine  100 mg Oral Daily   Continuous Infusions: . sodium chloride    . cefTRIAXone (ROCEPHIN)  IV 2 g (09/26/19 1729)    Antimicrobials: Anti-infectives (From admission, onward)   Start     Dose/Rate Route Frequency Ordered Stop   09/22/19 1800  cefTRIAXone (ROCEPHIN) 2 g in sodium chloride 0.9 % 100 mL IVPB        2 g 200 mL/hr over 30 Minutes Intravenous Every 24 hours 09/22/19 1736       Objective: Vitals: Today's Vitals   09/27/19 0837 09/27/19 0838 09/27/19 0900 09/27/19 1230  BP:   (!) 140/51 (!) 154/57  Pulse:   62 75  Resp:   16 16  Temp:   97.6 F (36.4 C)   TempSrc:   Oral   SpO2: 92% 92% 94% 94%  Weight:      Height:      PainSc:        Intake/Output Summary (Last 24 hours) at 09/27/2019 1256 Last data  filed at 09/27/2019 0836 Gross per 24 hour  Intake 443 ml  Output 1100 ml  Net -657 ml   Filed Weights   09/25/19 0556 09/26/19 0456 09/27/19 0541  Weight: 76.8 kg 78.3 kg 80.1 kg   Weight change: 1.8 kg  Intake/Output from previous day: 09/22 0701 - 09/23 0700 In: 243 [P.O.:240; I.V.:3] Out: 300 [Urine:300] Intake/Output this shift: Total I/O In: 203 [P.O.:200; I.V.:3] Out: 800 [Urine:800]  Examination: General exam: AAOx3, NAD, weak appearing. HEENT:Oral mucosa moist, Ear/Nose WNL grossly, dentition normal. Respiratory system: bilaterally wheezing present air entry noted,no use of accessory muscle Cardiovascular system: S1 & S2 +, No JVD,. Gastrointestinal system: Abdomen soft, NT,ND, BS+ Nervous System:Alert, awake, moving extremities and grossly nonfocal Extremities: No edema, distal peripheral pulses palpable.  Skin: No rashes,no icterus.  Right lower extremity bruise, swelling below rt knee MSK: Normal muscle bulk,tone, power  Data Reviewed: I have personally reviewed following labs and imaging studies CBC: Recent Labs  Lab 09/22/19 0900 09/22/19 1007 09/23/19 0755 09/24/19 0315 09/25/19 0239 09/26/19 0951 09/27/19 0718  WBC 8.0   < > 6.7 5.8 8.3 9.3 9.2  NEUTROABS 7.3  --  6.3  --   --   --   --   HGB 9.0*   < > 8.9* 8.9* 8.8* 9.6* 9.3*  HCT 29.4*   < > 29.3* 28.6* 28.8* 31.8* 31.0*  MCV 82.8   < > 83.0 83.4 86.0 86.4 87.1  PLT 243   < > 228 219 223 177 171   < > = values in this interval not displayed.   Basic Metabolic Panel: Recent Labs  Lab 09/23/19 0755 09/23/19 1806 09/23/19 2146 09/24/19 0315 09/25/19 0239 09/26/19 0951 09/27/19 0718  NA 121*   < > 123* 124* 128* 128* 129*  K 4.5   < > 4.2 4.1 4.1 3.9 4.0  CL 75*   < > 77* 77* 80* 77* 79*  CO2 34*   < > 34* 33* 39* 39* 38*  GLUCOSE 108*   < > 211* 156* 145* 190* 136*  BUN 11   < > 20 19 25* 26* 27*  CREATININE 0.80   < > 1.09* 0.87  0.78 0.87 0.76  CALCIUM 8.4*   < > 8.6* 8.7* 9.0 9.0 9.0   MG 2.1  --   --   --   --   --   --   PHOS 5.0*  --   --   --   --   --   --    < > = values in this interval not displayed.   GFR: Estimated Creatinine Clearance: 69.6 mL/min (by C-G formula based on SCr of 0.76 mg/dL). Liver Function Tests: Recent Labs  Lab 09/23/19 0755  AST 21  ALT 16  ALKPHOS 56  BILITOT 0.9  PROT 5.8*  ALBUMIN 3.2*   No results for input(s): LIPASE, AMYLASE in the last 168 hours. No results for input(s): AMMONIA in the last 168 hours. Coagulation Profile: No results for input(s): INR, PROTIME in the last 168 hours. Cardiac Enzymes: No results for input(s): CKTOTAL, CKMB, CKMBINDEX, TROPONINI in the last 168 hours. BNP (last 3 results) No results for input(s): PROBNP in the last 8760 hours. HbA1C: No results for input(s): HGBA1C in the last 72 hours. CBG: Recent Labs  Lab 09/26/19 1257 09/26/19 1703 09/26/19 2329 09/27/19 0540 09/27/19 1228  GLUCAP 175* 163* 190* 176* 167*   Lipid Profile: No results for input(s): CHOL, HDL, LDLCALC, TRIG, CHOLHDL, LDLDIRECT in the last 72 hours. Thyroid Function Tests: Recent Labs    09/26/19 0951  TSH 1.089   Anemia Panel: No results for input(s): VITAMINB12, FOLATE, FERRITIN, TIBC, IRON, RETICCTPCT in the last 72 hours. Sepsis Labs: No results for input(s): PROCALCITON, LATICACIDVEN in the last 168 hours.  Recent Results (from the past 240 hour(s))  SARS Coronavirus 2 by RT PCR (hospital order, performed in [facility] hospital lab) Nasopharyngeal Nasopharyngeal Swab     Status: None   Collection Time: 09/22/19  8:46 AM   Specimen: Nasopharyngeal Swab  Result Value Ref Range Status   SARS Coronavirus 2 NEGATIVE NEGATIVE Final    Comment: (NOTE) SARS-CoV-2 target nucleic acids are NOT DETECTED.  The SARS-CoV-2 RNA is generally detectable in upper and lower respiratory specimens during the acute phase of infection. The lowest concentration of SARS-CoV-2 viral copies this assay can detect is  250 copies / mL. A negative result does not preclude SARS-CoV-2 infection and should not be used as the sole basis for treatment or other patient management decisions.  A negative result may occur with improper specimen collection / handling, submission of specimen other than nasopharyngeal swab, presence of viral mutation(s) within the areas targeted by this assay, and inadequate number of viral copies (<250 copies / mL). A negative result must be combined with clinical observations, patient history, and epidemiological information.  Fact Sheet for Patients:   StrictlyIdeas.no  Fact Sheet for Healthcare Providers: BankingDealers.co.za  This test is not yet approved or  cleared by the [place] FDA and has been authorized for detection and/or diagnosis of SARS-CoV-2 by FDA under an Emergency Use Authorization (EUA).  This EUA will remain in effect (meaning this test can be used) for the duration of the COVID-19 declaration under Section 564(b)(1) of the Act, 21 U.S.C. section 360bbb-3(b)(1), unless the authorization is terminated or revoked sooner.  Performed at [facility], [place] [address], [place], [place] [address]      Radiology Studies: No results found.   LOS: 5 days   Antonieta Pert, MD [facility]  09/27/2019, 12:56 PM

## 2019-09-28 LAB — CBC
HCT: 31.6 % — ABNORMAL LOW (ref 36.0–46.0)
Hemoglobin: 9.3 g/dL — ABNORMAL LOW (ref 12.0–15.0)
MCH: 25.1 pg — ABNORMAL LOW (ref 26.0–34.0)
MCHC: 29.4 g/dL — ABNORMAL LOW (ref 30.0–36.0)
MCV: 85.4 fL (ref 80.0–100.0)
Platelets: 141 10*3/uL — ABNORMAL LOW (ref 150–400)
RBC: 3.7 MIL/uL — ABNORMAL LOW (ref 3.87–5.11)
RDW: 15.4 % (ref 11.5–15.5)
WBC: 7.8 10*3/uL (ref 4.0–10.5)
nRBC: 0 % (ref 0.0–0.2)

## 2019-09-28 LAB — BASIC METABOLIC PANEL
Anion gap: 10 (ref 5–15)
BUN: 23 mg/dL (ref 8–23)
CO2: 42 mmol/L — ABNORMAL HIGH (ref 22–32)
Calcium: 9 mg/dL (ref 8.9–10.3)
Chloride: 79 mmol/L — ABNORMAL LOW (ref 98–111)
Creatinine, Ser: 0.66 mg/dL (ref 0.44–1.00)
GFR calc Af Amer: >60 mL/min (ref >60)
GFR calc non Af Amer: >60 mL/min (ref >60)
Glucose, Bld: 153 mg/dL — ABNORMAL HIGH (ref 70–99)
Potassium: 4.2 mmol/L (ref 3.5–5.1)
Sodium: 131 mmol/L — ABNORMAL LOW (ref 135–145)

## 2019-09-28 LAB — GLUCOSE, CAPILLARY
Glucose-Capillary: 156 mg/dL — ABNORMAL HIGH (ref 70–99)
Glucose-Capillary: 172 mg/dL — ABNORMAL HIGH (ref 70–99)
Glucose-Capillary: 188 mg/dL — ABNORMAL HIGH (ref 70–99)
Glucose-Capillary: 211 mg/dL — ABNORMAL HIGH (ref 70–99)

## 2019-09-28 MED ORDER — THIAMINE HCL 100 MG PO TABS: 100.0000 mg | ORAL_TABLET | Freq: Every day | ORAL | Status: DC

## 2019-09-28 MED ORDER — FOLIC ACID 1 MG PO TABS: 1.0000 mg | ORAL_TABLET | Freq: Every day | ORAL | Status: DC

## 2019-09-28 MED ORDER — PREDNISONE 20 MG PO TABS: ORAL_TABLET | ORAL | Status: DC

## 2019-09-28 MED ORDER — PREDNISONE 20 MG PO TABS
40.0000 mg | ORAL_TABLET | Freq: Every day | ORAL | Status: DC
  Administered 2019-09-29 – 2019-09-30 (×2): 40 mg via ORAL
  Filled 2019-09-28 (×3): qty 2

## 2019-09-28 MED ORDER — FUROSEMIDE 20 MG PO TABS
20.0000 mg | ORAL_TABLET | Freq: Every day | ORAL | Status: DC
  Administered 2019-09-29 – 2019-09-30 (×2): 20 mg via ORAL
  Filled 2019-09-28 (×2): qty 1

## 2019-09-28 MED ORDER — METOPROLOL TARTRATE 50 MG PO TABS: 50.0000 mg | ORAL_TABLET | Freq: Two times a day (BID) | ORAL | Status: DC

## 2019-09-28 MED ORDER — APIXABAN 5 MG PO TABS
5.0000 mg | ORAL_TABLET | Freq: Two times a day (BID) | ORAL | Status: DC
Start: 2019-09-28 — End: 2020-07-23

## 2019-09-28 MED ORDER — FUROSEMIDE 20 MG PO TABS: 20.0000 mg | ORAL_TABLET | Freq: Every day | ORAL | Status: DC

## 2019-09-28 NOTE — Progress Notes (Signed)
PROGRESS NOTE    [name]  ZOX:[id number] DOB: [birth year] DOA: 09/22/2019 PCP: Lujean Amel, MD   Chief Complaint  Patient presents with  . Shortness of Breath  . COPD Exacerbation    Brief Narrative: 74 year old female with HTN, HLD, chronic diastolic CHF, COPD on 4 L at home, DM 2, tobacco and alcohol abuse, came into the hospital with shortness of breath.  Patient tells me that she has been short of breath over the last month however it is gotten progressively worse, she could not take it and came to the hospital and she was admitted on 9/18.  She reports several falls recently with bruises to the right side of her face, shoulder and legs.    Patient found to be hypoxic on 80% room air initially on nonrebreather, subsequently she was admitted with IV steroid IV Lasix with BiPAP overnight.   Patient was admitted and treated with iv lasix steroid nebulizer. Overall respiratory status improving.  Off bedtime BiPAP.  Symptoms became deconditioned anxious for fall seen by PT OT and has recommended skilled nursing facility. She continues to have bilateral extensive wheezing  Subjective: Overall breathing better.  On 4 L nasal cannula home setting.  Still wheezing but seems to be slightly better right lower extremity with bruising  Assessment & Plan:  Acute on chronic respiratory failure with hypoxia and hypercapnia due to COPD exacerbation and acute on chronic diastolic CHF exacerbation: Respiratory status overall much better.  We will change her to p.o. Lasix and p.o. steroid and monitor next 24 hours monitor CBC tolerating well and respiratory status stays stable. Cont on bronchodilators along with oxygen. she does still have wheezing I suspect some of it is chronic. 2D echo from 9/18 showed EF 60 to 65% with grade 1 diastolic dysfunction.She is noncommittal with her home oxygen as she was found on room air.  Acute COPD exacerbation: Continue to oral steroid continue  bronchodilators and monitor next 24 hours if stable can discharge to SNF tomorrow.  Still having wheezing  New onset atrial fibrillation, PAF: ONSET 9/19 night-with CHA2DS2-VASc score of 6 she was placed on IV heparin infusion. Echo unremarkable.  Checked TSH and is normal.  Normal sinus rhythm and on metoprolol 50 twice daily along with Eliquis for anticoagulation after discussing risk and benefits alternatives.  We again revisited her DOAC topic and she is comfortable with it  Essential hypertension: BP stable on metoprolol  HLD- Cont lipitor  Hyponatremia: Suspecting hypervolemic hyponatremia and also complicated by alcohol abuse.  Sodium has improved.  Recent Labs  Lab 09/24/19 0315 09/25/19 0239 09/26/19 0951 09/27/19 0718 09/28/19 0712  NA 124* 128* 128* 129* 131*   Labial infection-erythema noted on the left labia no fluctuance or deep abscess noted she was started on ceftriaxone-continue Augmentin.  WBC count 7.8.  Right knee hematoma secondary to fall. Rt leg is bruised distally as well  She has a skin bump on the area below the patella . monitor.    Anemia of chronic disease: H&H overall stable.   Recent Labs  Lab 09/24/19 0315 09/25/19 0239 09/26/19 0951 09/27/19 0718 09/28/19 0712  HGB 8.9* 8.8* 9.6* 9.3* 9.3*  HCT 28.6* 28.8* 31.8* 31.0* 31.6*   Alcohol use/tobacco use: No signs of withdrawal continue multivitamins.    Diabetes mellitus type II, hemoglobin A1c stable 6.4.  Blood sugars fairly stable monitor closely on a steroid. Recent Labs  Lab 09/27/19 1228 09/27/19 1725 09/28/19 0043 09/28/19 0608 09/28/19 1128  GLUCAP 167*  137* 188* 156* 211*   DVT prophylaxis: Eliquis Code Status:   Code Status: Full Code  Family Communication: plan of care discussed with patient at bedside.  Status is: Inpatient Remains inpatient appropriate because:IV treatments appropriate due to intensity of illness or inability to take PO, Inpatient level of care appropriate  due to severity of illness and For ongoing management of hypoxia, CHF and COPD exacerbation.  Dispo: The patient is from: Home              Anticipated d/c is to: SNF              Anticipated d/c date is: Tomorrow if medically               Patient currently is not medically stable to d/c. Nutrition: Diet Order            Diet heart healthy/carb modified Room service appropriate? Yes with Assist; Fluid consistency: Thin; Fluid restriction: 1500 mL Fluid  Diet effective now                 Body mass index is 26.75 kg/m. Pressure Ulcer: Pressure Injury 09/22/19 Knee Anterior;Right Stage 1 -  Intact skin with non-blanchable redness of a localized area usually over a bony prominence. (Active)  09/22/19 1530  Location: Knee  Location Orientation: Anterior;Right  Staging: Stage 1 -  Intact skin with non-blanchable redness of a localized area usually over a bony prominence.  Wound Description (Comments):   Present on Admission: Yes     Pressure Injury 09/22/19 Ankle Right;Lateral Stage 1 -  Intact skin with non-blanchable redness of a localized area usually over a bony prominence. (Active)  09/22/19 1530  Location: Ankle  Location Orientation: Right;Lateral  Staging: Stage 1 -  Intact skin with non-blanchable redness of a localized area usually over a bony prominence.  Wound Description (Comments):   Present on Admission: Yes     Pressure Injury 09/22/19 Heel Left Stage 1 -  Intact skin with non-blanchable redness of a localized area usually over a bony prominence. (Active)  09/22/19 1530  Location: Heel  Location Orientation: Left  Staging: Stage 1 -  Intact skin with non-blanchable redness of a localized area usually over a bony prominence.  Wound Description (Comments):   Present on Admission: Yes    Consultants:see note  Procedures:see note Microbiology:see note Blood Culture    Component Value Date/Time   SDES BLOOD RIGHT ANTECUBITAL 04/18/2016 1640   SPECREQUEST   04/18/2016 1640    BOTTLES DRAWN AEROBIC AND ANAEROBIC Blood Culture adequate volume   CULT NO GROWTH 6 DAYS 04/18/2016 1640   REPTSTATUS 04/24/2016 FINAL 04/18/2016 1640    Other culture-see note  Medications: Scheduled Meds: . amoxicillin-clavulanate  1 tablet Oral Q12H  . apixaban  5 mg Oral BID  . arformoterol  15 mcg Nebulization BID  . atorvastatin  20 mg Oral QPM  . budesonide (PULMICORT) nebulizer solution  0.5 mg Nebulization BID  . folic acid  1 mg Oral Daily  . [START ON 09/29/2019] furosemide  20 mg Oral Daily  . insulin aspart  0-9 Units Subcutaneous Q6H  . ipratropium-albuterol  3 mL Nebulization TID  . metoprolol tartrate  50 mg Oral BID  . multivitamin with minerals  1 tablet Oral Daily  . nicotine  21 mg Transdermal Daily  . nystatin   Topical BID  . pantoprazole (PROTONIX) IV  40 mg Intravenous Q24H  . [START ON 09/29/2019] predniSONE  40 mg Oral  Q breakfast  . sodium chloride flush  3 mL Intravenous Q12H  . thiamine  100 mg Oral Daily   Continuous Infusions: . sodium chloride      Antimicrobials: Anti-infectives (From admission, onward)   Start     Dose/Rate Route Frequency Ordered Stop   09/27/19 1800  amoxicillin-clavulanate (AUGMENTIN) 875-125 MG per tablet 1 tablet        1 tablet Oral Every 12 hours 09/27/19 1302 09/30/19 2159   09/22/19 1800  cefTRIAXone (ROCEPHIN) 2 g in sodium chloride 0.9 % 100 mL IVPB  Status:  Discontinued        2 g 200 mL/hr over 30 Minutes Intravenous Every 24 hours 09/22/19 1736 09/27/19 1302     Objective: Vitals: Today's Vitals   09/28/19 0755 09/28/19 0819 09/28/19 0855 09/28/19 1046  BP: (!) 166/57   (!) 159/69  Pulse: 94  73 70  Resp: 20   19  Temp: (!) 97 F (36.1 C)   97.7 F (36.5 C)  TempSrc: Oral   Oral  SpO2: 99%   95%  Weight:      Height:      PainSc:  0-No pain      Intake/Output Summary (Last 24 hours) at 09/28/2019 1206 Last data filed at 09/28/2019 0508 Gross per 24 hour  Intake 222 ml    Output 650 ml  Net -428 ml   Filed Weights   09/26/19 0456 09/27/19 0541 09/28/19 0507  Weight: 78.3 kg 80.1 kg 79.8 kg   Weight change: -0.3 kg  Intake/Output from previous day: 09/23 0701 - 09/24 0700 In: 425 [P.O.:422; I.V.:3] Out: 1450 [Urine:1450] Intake/Output this shift: No intake/output data recorded.  Examination: General exam: AAO x3, NAD, weak appearing. HEENT:Oral mucosa moist, Ear/Nose WNL grossly, dentition normal. Respiratory system: bilaterally air entry present but with wheezing which seems to be slightly better compared to before,no use of accessory muscle Cardiovascular system: S1 & S2 +, No JVD,. Gastrointestinal system: Abdomen soft, NT,ND, BS+ Nervous System:Alert, awake, moving extremities and grossly nonfocal Extremities: No edema, distal peripheral pulses palpable.  Skin: No rashes,no icterus. MSK: Normal muscle bulk,tone, power  Data Reviewed: I have personally reviewed following labs and imaging studies CBC: Recent Labs  Lab 09/22/19 0900 09/22/19 1007 09/23/19 0755 09/23/19 0755 09/24/19 0315 09/25/19 0239 09/26/19 0951 09/27/19 0718 09/28/19 0712  WBC 8.0   < > 6.7   < > 5.8 8.3 9.3 9.2 7.8  NEUTROABS 7.3  --  6.3  --   --   --   --   --   --   HGB 9.0*   < > 8.9*   < > 8.9* 8.8* 9.6* 9.3* 9.3*  HCT 29.4*   < > 29.3*   < > 28.6* 28.8* 31.8* 31.0* 31.6*  MCV 82.8   < > 83.0   < > 83.4 86.0 86.4 87.1 85.4  PLT 243   < > 228   < > 219 223 177 171 141*   < > = values in this interval not displayed.   Basic Metabolic Panel: Recent Labs  Lab 09/23/19 0755 09/23/19 1806 09/24/19 0315 09/25/19 0239 09/26/19 0951 09/27/19 0718 09/28/19 0712  NA 121*   < > 124* 128* 128* 129* 131*  K 4.5   < > 4.1 4.1 3.9 4.0 4.2  CL 75*   < > 77* 80* 77* 79* 79*  CO2 34*   < > 33* 39* 39* 38* 42*  GLUCOSE 108*   < >  156* 145* 190* 136* 153*  BUN 11   < > 19 25* 26* 27* 23  CREATININE 0.80   < > 0.87 0.78 0.87 0.76 0.66  CALCIUM 8.4*   < > 8.7* 9.0  9.0 9.0 9.0  MG 2.1  --   --   --   --   --   --   PHOS 5.0*  --   --   --   --   --   --    < > = values in this interval not displayed.   GFR: Estimated Creatinine Clearance: 69.5 mL/min (by C-G formula based on SCr of 0.66 mg/dL). Liver Function Tests: Recent Labs  Lab 09/23/19 0755  AST 21  ALT 16  ALKPHOS 56  BILITOT 0.9  PROT 5.8*  ALBUMIN 3.2*   No results for input(s): LIPASE, AMYLASE in the last 168 hours. No results for input(s): AMMONIA in the last 168 hours. Coagulation Profile: No results for input(s): INR, PROTIME in the last 168 hours. Cardiac Enzymes: No results for input(s): CKTOTAL, CKMB, CKMBINDEX, TROPONINI in the last 168 hours. BNP (last 3 results) No results for input(s): PROBNP in the last 8760 hours. HbA1C: No results for input(s): HGBA1C in the last 72 hours. CBG: Recent Labs  Lab 09/27/19 1228 09/27/19 1725 09/28/19 0043 09/28/19 0608 09/28/19 1128  GLUCAP 167* 137* 188* 156* 211*   Lipid Profile: No results for input(s): CHOL, HDL, LDLCALC, TRIG, CHOLHDL, LDLDIRECT in the last 72 hours. Thyroid Function Tests: Recent Labs    09/26/19 0951  TSH 1.089   Anemia Panel: No results for input(s): VITAMINB12, FOLATE, FERRITIN, TIBC, IRON, RETICCTPCT in the last 72 hours. Sepsis Labs: No results for input(s): PROCALCITON, LATICACIDVEN in the last 168 hours.  Recent Results (from the past 240 hour(s))  SARS Coronavirus 2 by RT PCR (hospital order, performed in [facility] hospital lab) Nasopharyngeal Nasopharyngeal Swab     Status: None   Collection Time: 09/22/19  8:46 AM   Specimen: Nasopharyngeal Swab  Result Value Ref Range Status   SARS Coronavirus 2 NEGATIVE NEGATIVE Final    Comment: (NOTE) SARS-CoV-2 target nucleic acids are NOT DETECTED.  The SARS-CoV-2 RNA is generally detectable in upper and lower respiratory specimens during the acute phase of infection. The lowest concentration of SARS-CoV-2 viral copies this assay can  detect is 250 copies / mL. A negative result does not preclude SARS-CoV-2 infection and should not be used as the sole basis for treatment or other patient management decisions.  A negative result may occur with improper specimen collection / handling, submission of specimen other than nasopharyngeal swab, presence of viral mutation(s) within the areas targeted by this assay, and inadequate number of viral copies (<250 copies / mL). A negative result must be combined with clinical observations, patient history, and epidemiological information.  Fact Sheet for Patients:   StrictlyIdeas.no  Fact Sheet for Healthcare Providers: BankingDealers.co.za  This test is not yet approved or  cleared by the [place] FDA and has been authorized for detection and/or diagnosis of SARS-CoV-2 by FDA under an Emergency Use Authorization (EUA).  This EUA will remain in effect (meaning this test can be used) for the duration of the COVID-19 declaration under Section 564(b)(1) of the Act, 21 U.S.C. section 360bbb-3(b)(1), unless the authorization is terminated or revoked sooner.  Performed at [facility], [place] [address] [place], [place] [address]   SARS CORONAVIRUS 2 (TAT 6-24 HRS) Nasopharyngeal Nasopharyngeal Swab  Status: None   Collection Time: 09/27/19  5:35 PM   Specimen: Nasopharyngeal Swab  Result Value Ref Range Status   SARS Coronavirus 2 NEGATIVE NEGATIVE Final    Comment: (NOTE) SARS-CoV-2 target nucleic acids are NOT DETECTED.  The SARS-CoV-2 RNA is generally detectable in upper and lower respiratory specimens during the acute phase of infection. Negative results do not preclude SARS-CoV-2 infection, do not rule out co-infections with other pathogens, and should not be used as the sole basis for treatment or other patient management decisions. Negative results must be combined with clinical observations, patient history,  and epidemiological information. The expected result is Negative.  Fact Sheet for Patients: SugarRoll.be  Fact Sheet for Healthcare Providers: https://www.woods-mathews.com/  This test is not yet approved or cleared by the [place] FDA and  has been authorized for detection and/or diagnosis of SARS-CoV-2 by FDA under an Emergency Use Authorization (EUA). This EUA will remain  in effect (meaning this test can be used) for the duration of the COVID-19 declaration under Se ction 564(b)(1) of the Act, 21 U.S.C. section 360bbb-3(b)(1), unless the authorization is terminated or revoked sooner.  Performed at [facility], [place] [address] [place], [place] [address]      Radiology Studies: No results found.   LOS: 6 days   Antonieta Pert, MD [facility]  09/28/2019, 12:06 PM

## 2019-09-28 NOTE — Progress Notes (Signed)
Physical Therapy Treatment Patient Details Name: [name] MRN: [id number] DOB: 06/06/1945 Today's Date: 09/28/2019    History of Present Illness Pt is a 74 y.o. female admitted 09/22/19 with SOB and several falls. Pt found to be hypoxic requiring BiPAP. Workup for acute on chronic hypoxic and hypercapnic respiratory failure due to acute on chronic diastolic CHF with acute pulmonary edema, and COPD exacerbation. Pt also with new onset afib, L labial infection, R knee hematoma. PMH includes HTN, HLD, CHF, COPD (on 4L home O2), DM2, substance abuse.    PT Comments    Pt reports she thinks she is discharging today but is willing to walk with therapy. Pt is limited in safe mobility by 4/4 DoE with short distance ambulation despite SaO2 >89%O2, in presence of generalized weakness and decreased balance. Pt is min A for bed mobility, min A for transfers and min A for short distance ambulation with RW. D/c plans remain appropriate at this time. PT will continue to follow acutely.     Follow Up Recommendations  SNF;Supervision/Assistance - 24 hour     Equipment Recommendations  None recommended by PT    Recommendations for Other Services       Precautions / Restrictions Precautions Precautions: Fall;Other (comment) Precaution Comments: Urinary urgency; L labial infection (painful with pericare); 4L O2 baseline Restrictions Weight Bearing Restrictions: No    Mobility  Bed Mobility Overal bed mobility: Needs Assistance Bed Mobility: Supine to Sit     Supine to sit: Min assist     General bed mobility comments: declined OOB activity  Transfers Overall transfer level: Needs assistance Equipment used: Rolling walker (2 wheeled) Transfers: Sit to/from Stand Sit to Stand: Min assist;Min guard         General transfer comment: minA from bed surface, vc for hand placement, min guard from [facility]   Ambulation/Gait Ambulation/Gait assistance: Min assist Gait Distance (Feet): 16  Feet Assistive device: Rolling walker (2 wheeled) Gait Pattern/deviations: Step-to pattern;Trunk flexed;Leaning posteriorly Gait velocity: Decreased   General Gait Details: light min A for steadying with RW, for ambulation from bed to [facility] and BSC almost to door and back to recliner         Balance Overall balance assessment: Needs assistance Sitting-balance support: No upper extremity supported;Feet supported Sitting balance-Leahy Scale: Fair Sitting balance - Comments: No assist needed to sit EOB.      Standing balance-Leahy Scale: Poor Standing balance comment: Reliant on UE support requiring assist for posterior pericare                            Cognition Arousal/Alertness: Awake/alert Behavior During Therapy: WFL for tasks assessed/performed Overall Cognitive Status: Within Functional Limits for tasks assessed                                           General Comments General comments (skin integrity, edema, etc.): 4L O2 via [place] 89-94%O2 throughout session       Pertinent Vitals/Pain Pain Assessment: No/denies pain           PT Goals (current goals can now be found in the care plan section) Acute Rehab PT Goals Patient Stated Goal: to get better and stronger PT Goal Formulation: With patient Time For Goal Achievement: 10/07/19 Potential to Achieve Goals: Good Progress towards PT goals: Progressing toward goals  Frequency    Min 2X/week      PT Plan Current plan remains appropriate       AM-PAC PT "6 Clicks" Mobility   Outcome Measure  Help needed turning from your back to your side while in a flat bed without using bedrails?: A Little Help needed moving from lying on your back to sitting on the side of a flat bed without using bedrails?: A Little Help needed moving to and from a bed to a chair (including a wheelchair)?: A Little Help needed standing up from a chair using your arms (e.g., wheelchair or bedside chair)?:  A Lot Help needed to walk in hospital room?: A Little Help needed climbing 3-5 steps with a railing? : A Lot 6 Click Score: 16    End of Session Equipment Utilized During Treatment: Oxygen Activity Tolerance: Patient tolerated treatment well;Patient limited by fatigue Patient left: in chair;with call bell/phone within reach;with chair alarm set Nurse Communication: Mobility status PT Visit Diagnosis: Unsteadiness on feet (R26.81);Muscle weakness (generalized) (M62.81)     Time: 1115-5208 PT Time Calculation (min) (ACUTE ONLY): 29 min  Charges:  $Gait Training: 8-22 mins $Therapeutic Activity: 8-22 mins                     Chayse Zatarain B. Migdalia Dk PT, DPT Acute Rehabilitation Services Pager [phone] Office [phone]    [place] 09/28/2019, 12:59 PM

## 2019-09-29 ENCOUNTER — Inpatient Hospital Stay (HOSPITAL_COMMUNITY): Payer: Medicare HMO

## 2019-09-29 LAB — GLUCOSE, CAPILLARY
Glucose-Capillary: 162 mg/dL — ABNORMAL HIGH (ref 70–99)
Glucose-Capillary: 269 mg/dL — ABNORMAL HIGH (ref 70–99)
Glucose-Capillary: 291 mg/dL — ABNORMAL HIGH (ref 70–99)
Glucose-Capillary: 94 mg/dL (ref 70–99)

## 2019-09-29 MED ORDER — METOPROLOL TARTRATE 25 MG PO TABS
25.0000 mg | ORAL_TABLET | Freq: Two times a day (BID) | ORAL | Status: DC
  Administered 2019-09-29 – 2019-09-30 (×2): 25 mg via ORAL
  Filled 2019-09-29 (×2): qty 1

## 2019-09-29 MED ORDER — METHYLPREDNISOLONE SODIUM SUCC 40 MG IJ SOLR
40.0000 mg | Freq: Once | INTRAMUSCULAR | Status: AC
  Administered 2019-09-29: 40 mg via INTRAVENOUS
  Filled 2019-09-29: qty 1

## 2019-09-29 MED ORDER — METOPROLOL TARTRATE 25 MG PO TABS: 25.0000 mg | ORAL_TABLET | Freq: Two times a day (BID) | ORAL | Status: DC

## 2019-09-29 NOTE — Progress Notes (Signed)
PROGRESS NOTE    [name]  BDZ:[id number] DOB: Mar 08, 1945 DOA: 09/22/2019 PCP: Lujean Amel, MD   Chief Complaint  Patient presents with  . Shortness of Breath  . COPD Exacerbation    Brief Narrative: 74 year old female with HTN, HLD, chronic diastolic CHF, COPD on 4 L at home, DM 2, tobacco and alcohol abuse, came into the hospital with shortness of breath.  Patient tells me that she has been short of breath over the last month however it is gotten progressively worse, she could not take it and came to the hospital and she was admitted on 9/18.  She reports several falls recently with bruises to the right side of her face, shoulder and legs.    Patient found to be hypoxic on 80% room air initially on nonrebreather, subsequently she was admitted with IV steroid IV Lasix with BiPAP overnight.   Patient was admitted and treated with iv lasix steroid nebulizer. Overall respiratory status improving.  Off bedtime BiPAP.  Symptoms became deconditioned anxious for fall seen by PT OT and has recommended skilled nursing facility. She continues to have bilateral extensive wheezing  Subjective:  This am hypoxic in mid 80s. o2 increased to 6l whiel eating saw her- done eating, appeared okay. Still on more than baseline o2, baseline o2 is 4l. No other new complaints'  Assessment & Plan:  Acute on chronic respiratory failure with hypoxia and hypercapnia due to COPD exacerbation and acute on chronic diastolic CHF exacerbation: somewhat more hypoxic this am- cont to wean down to 4l [place].  Cont on p.o. Lasix and p.o. steroid, will give her solumedrol 40 mg iv x1. We will monitor next 24 hours.  Cont nebs,  Has wheezing but with good air movement- suspect chronic component as well.  2D echo from 9/18 showed EF 60 to 65% with grade 1 diastolic dysfunction.She is noncommittal with her home oxygen as she was found on room air.  Acute COPD exacerbation: solumedrol iv x1, cont oral steroid  Nebs. See  #1   New onset atrial fibrillation, PAF: ONSET 9/19 night-with CHA2DS2-VASc score of 6 she was placed on IV heparin infusion. Echo unremarkable.  tsh stable, cont metoprolol- cut dwon to 25 mg bid 2/2 wheezing. Cont eliquis for anticoagulation after discussing risk and benefits alternatives.  We again revisited her DOAC topic and she is comfortable with it  Essential hypertension: BP stable on metoprolol  HLD- Cont lipitor  Hyponatremia: improved. suspecting hypervolemic hyponatremia and also complicated by alcohol abuse.  Sodium has improved.  Recent Labs  Lab 09/24/19 0315 09/25/19 0239 09/26/19 0951 09/27/19 0718 09/28/19 0712  NA 124* 128* 128* 129* 131*   Labial infection-erythema noted on the left labia no fluctuance or deep abscess noted she was started on ceftriaxone-continue Augmentin.  Leucocytosis stable  Right knee hematoma secondary to fall. Rt leg burise + . Monitor.   Anemia of chronic disease: H&H stable. Recent Labs  Lab 09/24/19 0315 09/25/19 0239 09/26/19 0951 09/27/19 0718 09/28/19 0712  HGB 8.9* 8.8* 9.6* 9.3* 9.3*  HCT 28.6* 28.8* 31.8* 31.0* 31.6*   Alcohol use/tobacco use: no signs of withdrawal  Diabetes mellitus type II, hemoglobin A1c stable 6.4. sugar controlled borderline. Monitor Keep on ssi. Recent Labs  Lab 09/28/19 1128 09/28/19 1714 09/29/19 0029 09/29/19 0543 09/29/19 1151  GLUCAP 211* 172* 162* 94 291*   DVT prophylaxis: Eliquis Code Status:   Code Status: Full Code  Family Communication: plan of care discussed with patient at bedside.  Status  is: Inpatient Remains inpatient appropriate because:IV treatments appropriate due to intensity of illness or inability to take PO, Inpatient level of care appropriate due to severity of illness and For ongoing management of hypoxia, CHF and COPD exacerbation.  Dispo: The patient is from: Home              Anticipated d/c is to: SNF              Anticipated d/c date is: Tomorrow if resp  status is stable              Patient currently is not medically stable to d/c. Nutrition: Diet Order            Diet heart healthy/carb modified Room service appropriate? Yes with Assist; Fluid consistency: Thin; Fluid restriction: 1500 mL Fluid  Diet effective now                 Body mass index is 27.26 kg/m. Pressure Ulcer: Pressure Injury 09/22/19 Knee Anterior;Right Stage 1 -  Intact skin with non-blanchable redness of a localized area usually over a bony prominence. (Active)  09/22/19 1530  Location: Knee  Location Orientation: Anterior;Right  Staging: Stage 1 -  Intact skin with non-blanchable redness of a localized area usually over a bony prominence.  Wound Description (Comments):   Present on Admission: Yes     Pressure Injury 09/22/19 Ankle Right;Lateral Stage 1 -  Intact skin with non-blanchable redness of a localized area usually over a bony prominence. (Active)  09/22/19 1530  Location: Ankle  Location Orientation: Right;Lateral  Staging: Stage 1 -  Intact skin with non-blanchable redness of a localized area usually over a bony prominence.  Wound Description (Comments):   Present on Admission: Yes     Pressure Injury 09/22/19 Heel Left Stage 1 -  Intact skin with non-blanchable redness of a localized area usually over a bony prominence. (Active)  09/22/19 1530  Location: Heel  Location Orientation: Left  Staging: Stage 1 -  Intact skin with non-blanchable redness of a localized area usually over a bony prominence.  Wound Description (Comments):   Present on Admission: Yes    Consultants:see note  Procedures:see note Microbiology:see note Blood Culture    Component Value Date/Time   SDES BLOOD RIGHT ANTECUBITAL 04/18/2016 1640   SPECREQUEST  04/18/2016 1640    BOTTLES DRAWN AEROBIC AND ANAEROBIC Blood Culture adequate volume   CULT NO GROWTH 6 DAYS 04/18/2016 1640   REPTSTATUS 04/24/2016 FINAL 04/18/2016 1640    Other culture-see  note  Medications: Scheduled Meds: . amoxicillin-clavulanate  1 tablet Oral Q12H  . apixaban  5 mg Oral BID  . arformoterol  15 mcg Nebulization BID  . atorvastatin  20 mg Oral QPM  . budesonide (PULMICORT) nebulizer solution  0.5 mg Nebulization BID  . folic acid  1 mg Oral Daily  . furosemide  20 mg Oral Daily  . insulin aspart  0-9 Units Subcutaneous Q6H  . ipratropium-albuterol  3 mL Nebulization TID  . methylPREDNISolone (SOLU-MEDROL) injection  40 mg Intravenous Once  . metoprolol tartrate  25 mg Oral BID  . multivitamin with minerals  1 tablet Oral Daily  . nicotine  21 mg Transdermal Daily  . nystatin   Topical BID  . pantoprazole (PROTONIX) IV  40 mg Intravenous Q24H  . predniSONE  40 mg Oral Q breakfast  . sodium chloride flush  3 mL Intravenous Q12H  . thiamine  100 mg Oral Daily   Continuous Infusions: .  sodium chloride      Antimicrobials: Anti-infectives (From admission, onward)   Start     Dose/Rate Route Frequency Ordered Stop   09/27/19 1800  amoxicillin-clavulanate (AUGMENTIN) 875-125 MG per tablet 1 tablet        1 tablet Oral Every 12 hours 09/27/19 1302 09/30/19 2159   09/22/19 1800  cefTRIAXone (ROCEPHIN) 2 g in sodium chloride 0.9 % 100 mL IVPB  Status:  Discontinued        2 g 200 mL/hr over 30 Minutes Intravenous Every 24 hours 09/22/19 1736 09/27/19 1302     Objective: Vitals: Today's Vitals   09/29/19 0804 09/29/19 1024 09/29/19 1027 09/29/19 1229  BP: (!) 151/63   140/60  Pulse: 75 78 82 71  Resp: 20 (!) 21 (!) 22 16  Temp: 98.2 F (36.8 C)   98.8 F (37.1 C)  TempSrc: Oral   Oral  SpO2: 92% 93% 92% 91%  Weight:      Height:      PainSc: 0-No pain       Intake/Output Summary (Last 24 hours) at 09/29/2019 1319 Last data filed at 09/29/2019 0809 Gross per 24 hour  Intake 840 ml  Output 150 ml  Net 690 ml   Filed Weights   09/27/19 0541 09/28/19 0507 09/29/19 0544  Weight: 80.1 kg 79.8 kg 81.3 kg   Weight change: 1.53 kg   Intake/Output from previous day: 09/24 0701 - 09/25 0700 In: 600 [P.O.:600] Out: -  Intake/Output this shift: Total I/O In: 240 [P.O.:240] Out: 150 [Urine:150]  Examination: General exam: AAO x3, old for age, on [place] 77, NAD, weak appearing. HEENT:Oral mucosa moist, Ear/Nose WNL grossly, dentition normal. Respiratory system: bilaterally wheezing+ but with air enrty,no use of accessory muscle Cardiovascular system: S1 & S2 +, No JVD,. Gastrointestinal system: Abdomen soft, NT,ND, BS+ Nervous System:Alert, awake, moving extremities and grossly nonfocal Extremities: No edema, distal peripheral pulses palpable.  Skin: No rashes,no icterus. Rt leg bruising and small swelling below rt patella MSK: Normal muscle bulk,tone, power  Data Reviewed: I have personally reviewed following labs and imaging studies CBC: Recent Labs  Lab 09/23/19 0755 09/23/19 0755 09/24/19 0315 09/25/19 0239 09/26/19 0951 09/27/19 0718 09/28/19 0712  WBC 6.7   < > 5.8 8.3 9.3 9.2 7.8  NEUTROABS 6.3  --   --   --   --   --   --   HGB 8.9*   < > 8.9* 8.8* 9.6* 9.3* 9.3*  HCT 29.3*   < > 28.6* 28.8* 31.8* 31.0* 31.6*  MCV 83.0   < > 83.4 86.0 86.4 87.1 85.4  PLT 228   < > 219 223 177 171 141*   < > = values in this interval not displayed.   Basic Metabolic Panel: Recent Labs  Lab 09/23/19 0755 09/23/19 1806 09/24/19 0315 09/25/19 0239 09/26/19 0951 09/27/19 0718 09/28/19 0712  NA 121*   < > 124* 128* 128* 129* 131*  K 4.5   < > 4.1 4.1 3.9 4.0 4.2  CL 75*   < > 77* 80* 77* 79* 79*  CO2 34*   < > 33* 39* 39* 38* 42*  GLUCOSE 108*   < > 156* 145* 190* 136* 153*  BUN 11   < > 19 25* 26* 27* 23  CREATININE 0.80   < > 0.87 0.78 0.87 0.76 0.66  CALCIUM 8.4*   < > 8.7* 9.0 9.0 9.0 9.0  MG 2.1  --   --   --   --   --   --  PHOS 5.0*  --   --   --   --   --   --    < > = values in this interval not displayed.   GFR: Estimated Creatinine Clearance: 70.1 mL/min (by C-G formula based on SCr of 0.66  mg/dL). Liver Function Tests: Recent Labs  Lab 09/23/19 0755  AST 21  ALT 16  ALKPHOS 56  BILITOT 0.9  PROT 5.8*  ALBUMIN 3.2*   No results for input(s): LIPASE, AMYLASE in the last 168 hours. No results for input(s): AMMONIA in the last 168 hours. Coagulation Profile: No results for input(s): INR, PROTIME in the last 168 hours. Cardiac Enzymes: No results for input(s): CKTOTAL, CKMB, CKMBINDEX, TROPONINI in the last 168 hours. BNP (last 3 results) No results for input(s): PROBNP in the last 8760 hours. HbA1C: No results for input(s): HGBA1C in the last 72 hours. CBG: Recent Labs  Lab 09/28/19 1128 09/28/19 1714 09/29/19 0029 09/29/19 0543 09/29/19 1151  GLUCAP 211* 172* 162* 94 291*   Lipid Profile: No results for input(s): CHOL, HDL, LDLCALC, TRIG, CHOLHDL, LDLDIRECT in the last 72 hours. Thyroid Function Tests: No results for input(s): TSH, T4TOTAL, FREET4, T3FREE, THYROIDAB in the last 72 hours. Anemia Panel: No results for input(s): VITAMINB12, FOLATE, FERRITIN, TIBC, IRON, RETICCTPCT in the last 72 hours. Sepsis Labs: No results for input(s): PROCALCITON, LATICACIDVEN in the last 168 hours.  Recent Results (from the past 240 hour(s))  SARS Coronavirus 2 by RT PCR (hospital order, performed in [facility] hospital lab) Nasopharyngeal Nasopharyngeal Swab     Status: None   Collection Time: 09/22/19  8:46 AM   Specimen: Nasopharyngeal Swab  Result Value Ref Range Status   SARS Coronavirus 2 NEGATIVE NEGATIVE Final    Comment: (NOTE) SARS-CoV-2 target nucleic acids are NOT DETECTED.  The SARS-CoV-2 RNA is generally detectable in upper and lower respiratory specimens during the acute phase of infection. The lowest concentration of SARS-CoV-2 viral copies this assay can detect is 250 copies / mL. A negative result does not preclude SARS-CoV-2 infection and should not be used as the sole basis for treatment or other patient management decisions.  A negative  result may occur with improper specimen collection / handling, submission of specimen other than nasopharyngeal swab, presence of viral mutation(s) within the areas targeted by this assay, and inadequate number of viral copies (<250 copies / mL). A negative result must be combined with clinical observations, patient history, and epidemiological information.  Fact Sheet for Patients:   StrictlyIdeas.no  Fact Sheet for Healthcare Providers: BankingDealers.co.za  This test is not yet approved or  cleared by the [place] FDA and has been authorized for detection and/or diagnosis of SARS-CoV-2 by FDA under an Emergency Use Authorization (EUA).  This EUA will remain in effect (meaning this test can be used) for the duration of the COVID-19 declaration under Section 564(b)(1) of the Act, 21 U.S.C. section 360bbb-3(b)(1), unless the authorization is terminated or revoked sooner.  Performed at [facility], [place] [address] [place], [place] [address]   SARS CORONAVIRUS 2 (TAT 6-24 HRS) Nasopharyngeal Nasopharyngeal Swab     Status: None   Collection Time: 09/27/19  5:35 PM   Specimen: Nasopharyngeal Swab  Result Value Ref Range Status   SARS Coronavirus 2 NEGATIVE NEGATIVE Final    Comment: (NOTE) SARS-CoV-2 target nucleic acids are NOT DETECTED.  The SARS-CoV-2 RNA is generally detectable in upper and lower respiratory specimens during the acute phase of infection.  Negative results do not preclude SARS-CoV-2 infection, do not rule out co-infections with other pathogens, and should not be used as the sole basis for treatment or other patient management decisions. Negative results must be combined with clinical observations, patient history, and epidemiological information. The expected result is Negative.  Fact Sheet for Patients: SugarRoll.be  Fact Sheet for Healthcare  Providers: https://www.woods-mathews.com/  This test is not yet approved or cleared by the [place] FDA and  has been authorized for detection and/or diagnosis of SARS-CoV-2 by FDA under an Emergency Use Authorization (EUA). This EUA will remain  in effect (meaning this test can be used) for the duration of the COVID-19 declaration under Se ction 564(b)(1) of the Act, 21 U.S.C. section 360bbb-3(b)(1), unless the authorization is terminated or revoked sooner.  Performed at [facility], [place] [address] [place], [place] [address]      Radiology Studies: DG Chest Port 1 View  Result Date: 09/29/2019 CLINICAL DATA:  Acute on chronic respiratory failure EXAM: PORTABLE CHEST 1 VIEW COMPARISON:  09/22/2019 FINDINGS: Cardiopericardial enlargement and aortic tortuosity. Hazy opacity at both lung bases, increased on the left from before. No pneumothorax or pulmonary edema. IMPRESSION: Atelectasis or infiltrates at the lung bases with mild progression on the left when compared to 2021. Small pleural effusions may be superimposed. Electronically Signed   By: Monte Fantasia M.D.   On: 09/29/2019 10:16     LOS: 7 days   Antonieta Pert, MD [facility]  09/29/2019, 1:19 PM

## 2019-09-29 NOTE — Progress Notes (Signed)
Patient oxygen saturation noted to be 85-86 on 4L [place] oxygen.  Congested cough noted and patient reports bringing up thick sputum.  Oxygen increased to 6L [place] for sats 88-91%.  Dr. Maren Beach notified.

## 2019-09-29 NOTE — Progress Notes (Signed)
Occupational Therapy Treatment Patient Details Name: [name] MRN: [id number] DOB: [birth year] Today's Date: 09/29/2019    History of present illness Pt is a 74 y.o. female admitted 09/22/19 with SOB and several falls. Pt found to be hypoxic requiring BiPAP. Workup for acute on chronic hypoxic and hypercapnic respiratory failure due to acute on chronic diastolic CHF with acute pulmonary edema, and COPD exacerbation. Pt also with new onset afib, L labial infection, R knee hematoma. PMH includes HTN, HLD, CHF, COPD (on 4L home O2), DM2, substance abuse.   OT comments  Pt. Seen for skilled OT treatment session.  Agreeable to oob to b.room.  Bed mobility min a.  Pt. Ambulated from eob to sink and required seated rest break.  o2 fluctuations to low 80s with max encouragement to utilize PLB to stablilize.  Pt. hoovering 87-89% on [facility] 4L.  States PLB makes her tired, reports she can not cont. With ambulation to b.room.  Assisted back to bed.  Cont. To progress functional mobility and ADLs as pt. Able next session.    Follow Up Recommendations  SNF    Equipment Recommendations       Recommendations for Other Services      Precautions / Restrictions Precautions Precautions: Fall;Other (comment) Precaution Comments: Urinary urgency; L labial infection (painful with pericare); 4L O2 baseline       Mobility Bed Mobility Overal bed mobility: Needs Assistance Bed Mobility: Supine to Sit;Sit to Supine     Supine to sit: HOB elevated;Min assist Sit to supine: Min assist;HOB elevated   General bed mobility comments: hob elevated assistance to bring trunk upright  Transfers Overall transfer level: Needs assistance Equipment used: Rolling walker (2 wheeled) Transfers: Sit to/from [facility] Sit to Stand: Min assist Stand pivot transfers: Min assist       General transfer comment: pt. min a to come into standing, good utilization of pushing from arm rests and edge of  bed but required cues to do so    Balance                                           ADL either performed or assessed with clinical judgement   ADL Overall ADL's : Needs assistance/impaired                         Toilet Transfer: Minimal assistance;RW;Ambulation Toilet Transfer Details (indicate cue type and reason): pt. ambulated from eob to sink and required a seated rest break. unable to make it to the b.room secondary to fatigue-max a to place pure wik at end of session         Functional mobility during ADLs: Minimal assistance;Rolling walker General ADL Comments: pt. with notable fatigue during attempted ambulation to b.room.  seated rest break then states after break that she wants to return to bed and can not cont. to b.room.  o2 fluctuating to low 80s with increased time to rebound and unable to sustain with inital breaths. hoovering around 87-89%     Vision       Perception     Praxis      Cognition Arousal/Alertness: Awake/alert Behavior During Therapy: WFL for tasks assessed/performed Overall Cognitive Status: Within Functional Limits for tasks assessed  Exercises     Shoulder Instructions       General Comments      Pertinent Vitals/ Pain       Pain Assessment: No/denies pain  Home Living                                          Prior Functioning/Environment              Frequency  Min 2X/week        Progress Toward Goals  OT Goals(current goals can now be found in the care plan section)  Progress towards OT goals: Progressing toward goals     Plan Discharge plan remains appropriate;Frequency remains appropriate    Co-evaluation                 AM-PAC OT "6 Clicks" Daily Activity     Outcome Measure   Help from another person eating meals?: None Help from another person taking care of personal grooming?: A Little Help  from another person toileting, which includes using toliet, bedpan, or urinal?: A Little Help from another person bathing (including washing, rinsing, drying)?: A Little Help from another person to put on and taking off regular upper body clothing?: A Little Help from another person to put on and taking off regular lower body clothing?: A Little 6 Click Score: 19    End of Session Equipment Utilized During Treatment: Oxygen  OT Visit Diagnosis: Muscle weakness (generalized) (M62.81);History of falling (Z91.81);Pain;Unsteadiness on feet (R26.81) Pain - Right/Left: Right Pain - part of body: Leg   Activity Tolerance Patient limited by fatigue   Patient Left in bed;with bed alarm set;with call bell/phone within reach   Nurse Communication          Time: 6578-4696 OT Time Calculation (min): 21 min  Charges: OT General Charges $OT Visit: 1 Visit OT Treatments $Self Care/Home Management : 8-22 mins  Sonia Baller, COTA/L Acute Rehabilitation [phone]   Janice Coffin 09/29/2019, 12:42 PM

## 2019-09-29 NOTE — Progress Notes (Signed)
Patient is not needing Bipap at this time.  Still at bedside if needed.  On 4L at this time which is home regimen.  Will continue to monitor.

## 2019-09-30 DIAGNOSIS — I5032 Chronic diastolic (congestive) heart failure: Secondary | ICD-10-CM | POA: Diagnosis not present

## 2019-09-30 DIAGNOSIS — J449 Chronic obstructive pulmonary disease, unspecified: Secondary | ICD-10-CM | POA: Diagnosis not present

## 2019-09-30 DIAGNOSIS — I10 Essential (primary) hypertension: Secondary | ICD-10-CM | POA: Diagnosis not present

## 2019-09-30 DIAGNOSIS — E871 Hypo-osmolality and hyponatremia: Secondary | ICD-10-CM | POA: Diagnosis not present

## 2019-09-30 DIAGNOSIS — E785 Hyperlipidemia, unspecified: Secondary | ICD-10-CM | POA: Diagnosis not present

## 2019-09-30 DIAGNOSIS — J441 Chronic obstructive pulmonary disease with (acute) exacerbation: Secondary | ICD-10-CM | POA: Diagnosis not present

## 2019-09-30 DIAGNOSIS — I4891 Unspecified atrial fibrillation: Secondary | ICD-10-CM | POA: Diagnosis not present

## 2019-09-30 DIAGNOSIS — J9622 Acute and chronic respiratory failure with hypercapnia: Secondary | ICD-10-CM | POA: Diagnosis not present

## 2019-09-30 DIAGNOSIS — D638 Anemia in other chronic diseases classified elsewhere: Secondary | ICD-10-CM | POA: Diagnosis not present

## 2019-09-30 DIAGNOSIS — E118 Type 2 diabetes mellitus with unspecified complications: Secondary | ICD-10-CM | POA: Diagnosis not present

## 2019-09-30 DIAGNOSIS — J9621 Acute and chronic respiratory failure with hypoxia: Secondary | ICD-10-CM | POA: Diagnosis not present

## 2019-09-30 LAB — GLUCOSE, CAPILLARY
Glucose-Capillary: 177 mg/dL — ABNORMAL HIGH (ref 70–99)
Glucose-Capillary: 115 mg/dL — ABNORMAL HIGH (ref 70–99)

## 2019-09-30 NOTE — Discharge Summary (Signed)
Physician Discharge Summary  [name] YWV:[id number] DOB: 1945/11/07 DOA: 09/22/2019  PCP: Lujean Amel, MD  Admit date: 09/22/2019 Discharge date: 09/30/2019  Admitted From: home Disposition:  SNF  Recommendations for Outpatient Follow-up:  1. Follow up with PCP in 1-2 weeks 2. Please obtain BMP/CBC in one week 3. Please follow up on the following pending results:  Home Health:No  Equipment/Devices: none  Discharge Condition: Stable Code Status:   Code Status: Full Code Diet recommendation:  Diet Order            Diet - low sodium heart healthy           Diet heart healthy/carb modified Room service appropriate? Yes with Assist; Fluid consistency: Thin; Fluid restriction: 1500 mL Fluid  Diet effective now                  Brief/Interim Summary: 74 year old female with HTN, HLD, chronic diastolic CHF, COPD on 4 L at home, DM 2, tobacco and alcohol abuse, came into the hospital with shortness of breath. Patient tells me that she has been short of breath over the last month however it is gotten progressively worse, she could not take it and came to the hospital and she was admitted on 9/18. She reports several falls recently with bruises to the right side of her face, shoulder and legs.   Patient found to be hypoxic on 80% room air initially on nonrebreather, subsequently she was admitted with IV steroid IV Lasix with BiPAP overnight.   Patient was admitted and treated with iv lasix steroid nebulizer. Overall respiratory status improving.  Off bedtime BiPAP.  Symptoms became deconditioned anxious for fall seen by PT OT and has recommended skilled nursing facility. She continues to have bilateral extensive wheezing to 87 intact.  This is likely chronic also cut down her metoprolol dose. Respiratory status remains a stable saturating well on 4 L nasal cannula and slowly improving physical deconditioning. She has wheezing which seems to be chronic( she endorsed the  same) She will be discharge to skilled nursing facility.  Discharge Diagnoses:  Acute on chronic respiratory failure with hypoxia and hypercapnia due to COPD exacerbation and acute on chronic diastolic CHF exacerbation: somewhat more hypoxic this am- cont to wean down to 4l [place].  Cont on p.o. Lasix and p.o. steroid-we will continue steroid taper diuretics with plan to follow-up BMP by the PCP at SNF.  Cont nebs,  Has wheezing but with good air movement-suspect chronic component of wheezing could be also from beta-blocker so cut down metoprolol to 25.   2D echo from 9/18 showed EF 60 to 65% with grade 1 diastolic dysfunction.She is noncommittal with her home oxygen as she was found on room air.  Acute COPD exacerbation: Continue steroid taper bronchodilators as above.  New onset atrial fibrillation, PAF: ONSET 9/19 night-with CHA2DS2-VASc score of 6 she was placed on IV heparin infusion. Echo unremarkable.  tsh stablecont metoprolol 25 mg, Eliquis for anticoagulation and  follow-up with cardiology. I have discussed risk and benefits alternatives of DOAC and she wants to proceed.  Essential hypertension: BP stable on metoprolol  HLD- Cont lipitor  Hyponatremia: improved. suspecting hypervolemic hyponatremia and also complicated by alcohol abuse.  Sodium has improved.  Check BMP in 1 week. Last Labs          Recent Labs  Lab 09/24/19 0315 09/25/19 0239 09/26/19 0951 09/27/19 0718 09/28/19 0712  NA 124* 128* 128* 129* 131*     Labial infection-erythema  noted on the left labia no fluctuance or deep abscess noted she was started on ceftriaxone-completed Augmentin and no complaints in labial area area now.  Right knee hematoma secondary to fall. Rt leg burise + . Monitor.   Anemia of chronic disease: H&H stable.. check in 1 week Last Labs          Recent Labs  Lab 09/24/19 0315 09/25/19 0239 09/26/19 0951 09/27/19 0718 09/28/19 0712  HGB 8.9* 8.8* 9.6* 9.3* 9.3*  HCT 28.6*  28.8* 31.8* 31.0* 31.6*     Alcohol use/tobacco use: no signs of withdrawal  Diabetes mellitus type II, hemoglobin A1c stable 6.4. sugar controlled borderline. Monitor Keep on ssi.   Pressure Ulcer: Pressure Injury 09/22/19 Ankle Right;Lateral Stage 1 -  Intact skin with non-blanchable redness of a localized area usually over a bony prominence. (Active)  09/22/19 1530  Location: Ankle  Location Orientation: Right;Lateral  Staging: Stage 1 -  Intact skin with non-blanchable redness of a localized area usually over a bony prominence.  Wound Description (Comments):   Present on Admission: Yes     Pressure Injury 09/22/19 Heel Left Stage 1 -  Intact skin with non-blanchable redness of a localized area usually over a bony prominence. (Active)  09/22/19 1530  Location: Heel  Location Orientation: Left  Staging: Stage 1 -  Intact skin with non-blanchable redness of a localized area usually over a bony prominence.  Wound Description (Comments):   Present on Admission: Yes    Consults:  PT  SW  Subjective: Resting aaox3, not in distress, wheezing + but is chronic Afebrile On 4l [place] home setting  Discharge Exam: Vitals:   09/29/19 2100 09/30/19 0735  BP:    Pulse: 78 70  Resp:  17  Temp:    SpO2:  100%   General: Pt is alert, awake, not in acute distress Cardiovascular: RRR, S1/S2 +, no rubs, no gallops Respiratory: CTA bilaterally, no wheezing, no rhonchi Abdominal: Soft, NT, ND, bowel sounds + Extremities: no edema, no cyanosis  Discharge Instructions  Discharge Instructions    Diet - low sodium heart healthy   Complete by: As directed    Discharge instructions   Complete by: As directed    Please call call MD or return to ER for similar or worsening recurring problem that brought you to hospital or if any fever,nausea/vomiting,abdominal pain, uncontrolled pain, chest pain,  shortness of breath or any other alarming symptoms.  Please follow-up your doctor as  instructed in a week time and call the office for appointment.  CHECK CBC/BMP in 1 week.  Caution with fall- as you are on blood thinner.  Please avoid alcohol, smoking, or any other illicit substance and maintain healthy habits including taking your regular medications as prescribed.  You were cared for by a hospitalist during your hospital stay. If you have any questions about your discharge medications or the care you received while you were in the hospital after you are discharged, you can call the unit and ask to speak with the hospitalist on call if the hospitalist that took care of you is not available.  Once you are discharged, your primary care physician will handle any further medical issues. Please note that NO REFILLS for any discharge medications will be authorized once you are discharged, as it is imperative that you return to your primary care physician (or establish a relationship with a primary care physician if you do not have one) for your aftercare needs so that they  can reassess your need for medications and monitor your lab values   Discharge wound care:   Complete by: As directed    Off loading, wound care as needed   Increase activity slowly   Complete by: As directed      Allergies as of 09/30/2019   No Known Allergies     Medication List    STOP taking these medications   amLODipine-valsartan 10-320 MG tablet Commonly known as: EXFORGE   aspirin EC 81 MG tablet   metoprolol succinate 25 MG 24 hr tablet Commonly known as: TOPROL-XL     TAKE these medications   apixaban 5 MG Tabs tablet Commonly known as: ELIQUIS Take 1 tablet (5 mg total) by mouth 2 (two) times daily.   atorvastatin 20 MG tablet Commonly known as: LIPITOR Take 20 mg by mouth every evening.   diazepam 5 MG tablet Commonly known as: VALIUM Take 1 tablet (5 mg total) by mouth every 12 (twelve) hours as needed for anxiety.   feeding supplement (ENSURE ENLIVE) Liqd Take 237 mLs by  mouth 2 (two) times daily between meals.   feeding supplement (PRO-STAT SUGAR FREE 64) Liqd Take 30 mLs by mouth 2 (two) times daily.   ferrous sulfate 325 (65 FE) MG tablet Take 1 tablet (325 mg total) by mouth 2 (two) times daily with a meal.   folic acid 1 MG tablet Commonly known as: FOLVITE Take 1 tablet (1 mg total) by mouth daily.   furosemide 20 MG tablet Commonly known as: LASIX Take 1 tablet (20 mg total) by mouth daily for 14 days. reassess in 2 wk for lasix   ipratropium-albuterol 0.5-2.5 (3) MG/3ML Soln Commonly known as: DUONEB Take 3 mLs by nebulization every 4 (four) hours as needed.   Jardiance 10 MG Tabs tablet Generic drug: empagliflozin Take 10 mg by mouth daily.   metFORMIN 500 MG tablet Commonly known as: GLUCOPHAGE Take 500 mg by mouth 2 (two) times daily with a meal.   methocarbamol 500 MG tablet Commonly known as: ROBAXIN Take 1 tablet (500 mg total) by mouth every 8 (eight) hours as needed for muscle spasms.   metoprolol tartrate 25 MG tablet Commonly known as: LOPRESSOR Take 1 tablet (25 mg total) by mouth 2 (two) times daily.   multivitamin with minerals Tabs tablet Take 1 tablet by mouth daily.   omeprazole 20 MG capsule Commonly known as: PRILOSEC Take 1 tablet by mouth daily.   PARoxetine 20 MG tablet Commonly known as: PAXIL Take 20 mg by mouth daily.   predniSONE 20 MG tablet Commonly known as: DELTASONE Take PO 4 tabs daily x 3 days,3 tabs daily x 3 days,2 tabs daily x 3 days,1 tab daily x 2 days then stop.   silver sulfADIAZINE 1 % cream Commonly known as: Silvadene Apply 1 application topically daily.   Symbicort 160-4.5 MCG/ACT inhaler Generic drug: budesonide-formoterol Inhale 2 puffs into the lungs 2 (two) times daily.   thiamine 100 MG tablet Take 1 tablet (100 mg total) by mouth daily.            Discharge Care Instructions  (From admission, onward)         Start     Ordered   09/30/19 0000  Discharge  wound care:       Comments: Off loading, wound care as needed   09/30/19 6759          Contact information for follow-up providers    Koirala, Dibas, MD Follow  up in 1 week(s).   Specialty: Family Medicine Contact information: [place] [address] [phone]            Contact information for after-discharge care    Destination    [facility] .   Service: Skilled Nursing Contact information: [place] [phone]                 No Known Allergies  The results of significant diagnostics from this hospitalization (including imaging, microbiology, ancillary and laboratory) are listed below for reference.    Microbiology: Recent Results (from the past 240 hour(s))  SARS Coronavirus 2 by RT PCR (hospital order, performed in [facility] hospital lab) Nasopharyngeal Nasopharyngeal Swab     Status: None   Collection Time: 09/22/19  8:46 AM   Specimen: Nasopharyngeal Swab  Result Value Ref Range Status   SARS Coronavirus 2 NEGATIVE NEGATIVE Final    Comment: (NOTE) SARS-CoV-2 target nucleic acids are NOT DETECTED.  The SARS-CoV-2 RNA is generally detectable in upper and lower respiratory specimens during the acute phase of infection. The lowest concentration of SARS-CoV-2 viral copies this assay can detect is 250 copies / mL. A negative result does not preclude SARS-CoV-2 infection and should not be used as the sole basis for treatment or other patient management decisions.  A negative result may occur with improper specimen collection / handling, submission of specimen other than nasopharyngeal swab, presence of viral mutation(s) within the areas targeted by this assay, and inadequate number of viral copies (<250 copies / mL). A negative result must be combined with clinical observations, patient history, and epidemiological information.  Fact Sheet for Patients:    StrictlyIdeas.no  Fact Sheet for Healthcare Providers: BankingDealers.co.za  This test is not yet approved or  cleared by the [place] FDA and has been authorized for detection and/or diagnosis of SARS-CoV-2 by FDA under an Emergency Use Authorization (EUA).  This EUA will remain in effect (meaning this test can be used) for the duration of the COVID-19 declaration under Section 564(b)(1) of the Act, 21 U.S.C. section 360bbb-3(b)(1), unless the authorization is terminated or revoked sooner.  Performed at [facility], [place] [address], [place], [place] [address]   SARS CORONAVIRUS 2 (TAT 6-24 HRS) Nasopharyngeal Nasopharyngeal Swab     Status: None   Collection Time: 09/27/19  5:35 PM   Specimen: Nasopharyngeal Swab  Result Value Ref Range Status   SARS Coronavirus 2 NEGATIVE NEGATIVE Final    Comment: (NOTE) SARS-CoV-2 target nucleic acids are NOT DETECTED.  The SARS-CoV-2 RNA is generally detectable in upper and lower respiratory specimens during the acute phase of infection. Negative results do not preclude SARS-CoV-2 infection, do not rule out co-infections with other pathogens, and should not be used as the sole basis for treatment or other patient management decisions. Negative results must be combined with clinical observations, patient history, and epidemiological information. The expected result is Negative.  Fact Sheet for Patients: SugarRoll.be  Fact Sheet for Healthcare Providers: https://www.woods-mathews.com/  This test is not yet approved or cleared by the [place] FDA and  has been authorized for detection and/or diagnosis of SARS-CoV-2 by FDA under an Emergency Use Authorization (EUA). This EUA will remain  in effect (meaning this test can be used) for the duration of the COVID-19 declaration under Se ction 564(b)(1) of the Act, 21 U.S.C. section  360bbb-3(b)(1), unless the authorization is terminated or revoked  sooner.  Performed at [facility], [place] [address], [place], [place] [address]     Procedures/Studies: DG Ankle Complete Right  Result Date: 09/22/2019 CLINICAL DATA:  Status post fall.  Bruising to right ankle in knee. EXAM: RIGHT ANKLE - COMPLETE 3+ VIEW COMPARISON:  None. FINDINGS: Mild medial soft tissue swelling. No underlying fracture or dislocation identified. No radio-opaque foreign body or soft tissue calcifications. IMPRESSION: Medial soft tissue swelling. Electronically Signed   By: Kerby Moors M.D.   On: 09/22/2019 09:52   CT Head Wo Contrast  Result Date: 09/22/2019 CLINICAL DATA:  Facial trauma. EXAM: CT HEAD WITHOUT CONTRAST CT MAXILLOFACIAL WITHOUT CONTRAST TECHNIQUE: Multidetector CT imaging of the head and maxillofacial structures were performed using the standard protocol without intravenous contrast. Multiplanar CT image reconstructions of the maxillofacial structures were also generated. COMPARISON:  None. FINDINGS: CT HEAD FINDINGS Brain: No evidence of acute infarction, hemorrhage, hydrocephalus, extra-axial collection or mass lesion/mass effect. Large, extra-axial CSF density structure within the right posterior cranial fossa is identified measuring 6.3 x 3.1 cm. Findings compatible with a arachnoid cyst. Vascular: No hyperdense vessel or unexpected calcification. Skull: Normal. Negative for fracture or focal lesion. Other: None. CT MAXILLOFACIAL FINDINGS Osseous: No fracture or mandibular dislocation. No destructive process. Orbits: Negative. No traumatic or inflammatory finding. Sinuses: The paranasal sinuses are clear. Soft tissues: Negative. IMPRESSION: 1. No acute intracranial abnormalities. 2. Large right posterior cranial fossa arachnoid cyst. 3. No evidence for facial bone fracture. Electronically Signed   By: Kerby Moors M.D.   On: 09/22/2019 10:15   DG Chest Port 1 View  Result Date:  09/29/2019 CLINICAL DATA:  Acute on chronic respiratory failure EXAM: PORTABLE CHEST 1 VIEW COMPARISON:  09/22/2019 FINDINGS: Cardiopericardial enlargement and aortic tortuosity. Hazy opacity at both lung bases, increased on the left from before. No pneumothorax or pulmonary edema. IMPRESSION: Atelectasis or infiltrates at the lung bases with mild progression on the left when compared to 2021. Small pleural effusions may be superimposed. Electronically Signed   By: Monte Fantasia M.D.   On: 09/29/2019 10:16   DG Chest Port 1 View  Result Date: 09/22/2019 CLINICAL DATA:  Shortness of breath. EXAM: PORTABLE CHEST 1 VIEW COMPARISON:  01/26/2018 FINDINGS: Stable cardiomediastinal contour. Aortic atherosclerosis. Bilateral pleural effusions identified, right greater than left. Mild interstitial edema. No airspace opacities. IMPRESSION: 1. Bilateral pleural effusions and mild interstitial edema compatible with CHF. 2.  Aortic Atherosclerosis (ICD10-I70.0). Electronically Signed   By: Kerby Moors M.D.   On: 09/22/2019 09:08   DG Knee Complete 4 Views Right  Result Date: 09/22/2019 CLINICAL DATA:  Fall. EXAM: RIGHT KNEE - COMPLETE 4+ VIEW COMPARISON:  None FINDINGS: No joint effusion. There is focal soft tissue thickening of the overlying the mid and distal patellar tendon which measures up to 1.8 cm in thickness. Sharpening the tibial spines noted. No fracture or dislocation. No radio-opaque foreign body or soft tissue calcifications. IMPRESSION: 1. No acute bone abnormality. 2. Focal thickening overlying the mid and distal patellar tendon concerning for underlying hematoma. 3. Mild osteoarthritis. Electronically Signed   By: Kerby Moors M.D.   On: 09/22/2019 09:47   ECHOCARDIOGRAM COMPLETE  Result Date: 09/22/2019    ECHOCARDIOGRAM REPORT   Patient Name:   ANJANAE WOEHRLE Date of Exam: 09/22/2019 Medical Rec #:  [id number]           Height:       68.0 in Accession #:    [id number]  Weight:        161.2 lb Date of Birth:  1945-12-08           BSA:          1.865 m Patient Age:    71 years            BP:           151/74 mmHg Patient Gender: F                   HR:           82 bpm. Exam Location:  Inpatient Procedure: 2D Echo, Cardiac Doppler and Color Doppler Indications:    CHF-Acute Diastolic 546.27 / O35.00  History:        Patient has no prior history of Echocardiogram examinations.                 COPD; Risk Factors:Hypertension, Dyslipidemia and Diabetes.  Sonographer:    Bernadene Person RDCS Referring Phys: [id number] RONDELL A SMITH IMPRESSIONS  1. Left ventricular ejection fraction, by estimation, is 60 to 65%. The left ventricle has normal function. The left ventricle has no regional wall motion abnormalities. Left ventricular diastolic parameters are consistent with Grade I diastolic dysfunction (impaired relaxation).  2. Right ventricular systolic function is normal. The right ventricular size is normal.  3. The mitral valve is normal in structure. No evidence of mitral valve regurgitation. No evidence of mitral stenosis.  4. The aortic valve is normal in structure. Aortic valve regurgitation is not visualized. No aortic stenosis is present.  5. The inferior vena cava is normal in size with greater than 50% respiratory variability, suggesting right atrial pressure of 3 mmHg. FINDINGS  Left Ventricle: Left ventricular ejection fraction, by estimation, is 60 to 65%. The left ventricle has normal function. The left ventricle has no regional wall motion abnormalities. The left ventricular internal cavity size was normal in size. There is  no left ventricular hypertrophy. Left ventricular diastolic parameters are consistent with Grade I diastolic dysfunction (impaired relaxation). Right Ventricle: The right ventricular size is normal. No increase in right ventricular wall thickness. Right ventricular systolic function is normal. Left Atrium: Left atrial size was normal in size. Right Atrium: Right  atrial size was normal in size. Pericardium: There is no evidence of pericardial effusion. Mitral Valve: The mitral valve is normal in structure. No evidence of mitral valve regurgitation. No evidence of mitral valve stenosis. Tricuspid Valve: The tricuspid valve is normal in structure. Tricuspid valve regurgitation is mild . No evidence of tricuspid stenosis. Aortic Valve: The aortic valve is normal in structure. Aortic valve regurgitation is not visualized. No aortic stenosis is present. Pulmonic Valve: The pulmonic valve was normal in structure. Pulmonic valve regurgitation is not visualized. No evidence of pulmonic stenosis. Aorta: The aortic root is normal in size and structure. Venous: The inferior vena cava is normal in size with greater than 50% respiratory variability, suggesting right atrial pressure of 3 mmHg. IAS/Shunts: No atrial level shunt detected by color flow Doppler.  LEFT VENTRICLE PLAX 2D LVIDd:         5.30 cm  Diastology LVIDs:         3.80 cm  LV e' medial:    5.22 cm/s LV PW:         0.90 cm  LV E/e' medial:  20.3 LV IVS:        0.90 cm  LV e' lateral:   7.40 cm/s LVOT  diam:     1.90 cm  LV E/e' lateral: 14.3 LV SV:         69 LV SV Index:   37 LVOT Area:     2.84 cm  RIGHT VENTRICLE RV S prime:     14.40 cm/s TAPSE (M-mode): 2.2 cm LEFT ATRIUM           Index       RIGHT ATRIUM           Index LA diam:      4.30 cm 2.31 cm/m  RA Area:     16.30 cm LA Vol (A2C): 30.6 ml 16.41 ml/m RA Volume:   42.60 ml  22.84 ml/m LA Vol (A4C): 40.0 ml 21.45 ml/m  AORTIC VALVE LVOT Vmax:   114.00 cm/s LVOT Vmean:  80.300 cm/s LVOT VTI:    0.245 m  AORTA Ao Root diam: 2.90 cm Ao Asc diam:  2.50 cm MITRAL VALVE MV Area (PHT): 4.60 cm     SHUNTS MV Decel Time: 165 msec     Systemic VTI:  0.24 m MV E velocity: 106.00 cm/s  Systemic Diam: 1.90 cm MV A velocity: 128.00 cm/s MV E/A ratio:  0.83 Candee Furbish MD Electronically signed by Candee Furbish MD Signature Date/Time: 09/22/2019/2:06:37 PM    Final    CT  Maxillofacial Wo Contrast  Result Date: 09/22/2019 CLINICAL DATA:  Facial trauma. EXAM: CT HEAD WITHOUT CONTRAST CT MAXILLOFACIAL WITHOUT CONTRAST TECHNIQUE: Multidetector CT imaging of the head and maxillofacial structures were performed using the standard protocol without intravenous contrast. Multiplanar CT image reconstructions of the maxillofacial structures were also generated. COMPARISON:  None. FINDINGS: CT HEAD FINDINGS Brain: No evidence of acute infarction, hemorrhage, hydrocephalus, extra-axial collection or mass lesion/mass effect. Large, extra-axial CSF density structure within the right posterior cranial fossa is identified measuring 6.3 x 3.1 cm. Findings compatible with a arachnoid cyst. Vascular: No hyperdense vessel or unexpected calcification. Skull: Normal. Negative for fracture or focal lesion. Other: None. CT MAXILLOFACIAL FINDINGS Osseous: No fracture or mandibular dislocation. No destructive process. Orbits: Negative. No traumatic or inflammatory finding. Sinuses: The paranasal sinuses are clear. Soft tissues: Negative. IMPRESSION: 1. No acute intracranial abnormalities. 2. Large right posterior cranial fossa arachnoid cyst. 3. No evidence for facial bone fracture. Electronically Signed   By: Kerby Moors M.D.   On: 09/22/2019 10:15    Labs: BNP (last 3 results) Recent Labs    09/22/19 0900  BNP 852.7*   Basic Metabolic Panel: Recent Labs  Lab 09/24/19 0315 09/25/19 0239 09/26/19 0951 09/27/19 0718 09/28/19 0712  NA 124* 128* 128* 129* 131*  K 4.1 4.1 3.9 4.0 4.2  CL 77* 80* 77* 79* 79*  CO2 33* 39* 39* 38* 42*  GLUCOSE 156* 145* 190* 136* 153*  BUN 19 25* 26* 27* 23  CREATININE 0.87 0.78 0.87 0.76 0.66  CALCIUM 8.7* 9.0 9.0 9.0 9.0   Liver Function Tests: No results for input(s): AST, ALT, ALKPHOS, BILITOT, PROT, ALBUMIN in the last 168 hours. No results for input(s): LIPASE, AMYLASE in the last 168 hours. No results for input(s): AMMONIA in the last 168  hours. CBC: Recent Labs  Lab 09/24/19 0315 09/25/19 0239 09/26/19 0951 09/27/19 0718 09/28/19 0712  WBC 5.8 8.3 9.3 9.2 7.8  HGB 8.9* 8.8* 9.6* 9.3* 9.3*  HCT 28.6* 28.8* 31.8* 31.0* 31.6*  MCV 83.4 86.0 86.4 87.1 85.4  PLT 219 223 177 171 141*   Cardiac Enzymes: No results for input(s): CKTOTAL,  CKMB, CKMBINDEX, TROPONINI in the last 168 hours. BNP: Invalid input(s): POCBNP CBG: Recent Labs  Lab 09/29/19 0543 09/29/19 1151 09/29/19 1625 09/30/19 0118 09/30/19 0543  GLUCAP 94 291* 269* 177* 115*   D-Dimer No results for input(s): DDIMER in the last 72 hours. Hgb A1c No results for input(s): HGBA1C in the last 72 hours. Lipid Profile No results for input(s): CHOL, HDL, LDLCALC, TRIG, CHOLHDL, LDLDIRECT in the last 72 hours. Thyroid function studies No results for input(s): TSH, T4TOTAL, T3FREE, THYROIDAB in the last 72 hours.  Invalid input(s): FREET3 Anemia work up No results for input(s): VITAMINB12, FOLATE, FERRITIN, TIBC, IRON, RETICCTPCT in the last 72 hours. Urinalysis    Component Value Date/Time   COLORURINE YELLOW 09/22/2019 2130   APPEARANCEUR CLEAR 09/22/2019 2130   LABSPEC 1.007 09/22/2019 2130   PHURINE 5.0 09/22/2019 2130   GLUCOSEU >=500 (A) 09/22/2019 2130   HGBUR NEGATIVE 09/22/2019 2130   [place] NEGATIVE 09/22/2019 2130   [place] NEGATIVE 09/22/2019 2130   PROTEINUR 30 (A) 09/22/2019 2130   NITRITE NEGATIVE 09/22/2019 2130   LEUKOCYTESUR NEGATIVE 09/22/2019 2130   Sepsis Labs Invalid input(s): PROCALCITONIN,  WBC,  LACTICIDVEN Microbiology Recent Results (from the past 240 hour(s))  SARS Coronavirus 2 by RT PCR (hospital order, performed in [facility] hospital lab) Nasopharyngeal Nasopharyngeal Swab     Status: None   Collection Time: 09/22/19  8:46 AM   Specimen: Nasopharyngeal Swab  Result Value Ref Range Status   SARS Coronavirus 2 NEGATIVE NEGATIVE Final    Comment: (NOTE) SARS-CoV-2 target nucleic acids are NOT  DETECTED.  The SARS-CoV-2 RNA is generally detectable in upper and lower respiratory specimens during the acute phase of infection. The lowest concentration of SARS-CoV-2 viral copies this assay can detect is 250 copies / mL. A negative result does not preclude SARS-CoV-2 infection and should not be used as the sole basis for treatment or other patient management decisions.  A negative result may occur with improper specimen collection / handling, submission of specimen other than nasopharyngeal swab, presence of viral mutation(s) within the areas targeted by this assay, and inadequate number of viral copies (<250 copies / mL). A negative result must be combined with clinical observations, patient history, and epidemiological information.  Fact Sheet for Patients:   StrictlyIdeas.no  Fact Sheet for Healthcare Providers: BankingDealers.co.za  This test is not yet approved or  cleared by the [place] FDA and has been authorized for detection and/or diagnosis of SARS-CoV-2 by FDA under an Emergency Use Authorization (EUA).  This EUA will remain in effect (meaning this test can be used) for the duration of the COVID-19 declaration under Section 564(b)(1) of the Act, 21 U.S.C. section 360bbb-3(b)(1), unless the authorization is terminated or revoked sooner.  Performed at [facility], [place] [address] [place], [place] [address]   SARS CORONAVIRUS 2 (TAT 6-24 HRS) Nasopharyngeal Nasopharyngeal Swab     Status: None   Collection Time: 09/27/19  5:35 PM   Specimen: Nasopharyngeal Swab  Result Value Ref Range Status   SARS Coronavirus 2 NEGATIVE NEGATIVE Final    Comment: (NOTE) SARS-CoV-2 target nucleic acids are NOT DETECTED.  The SARS-CoV-2 RNA is generally detectable in upper and lower respiratory specimens during the acute phase of infection. Negative results do not preclude SARS-CoV-2 infection, do not rule  out co-infections with other pathogens, and should not be used as the sole basis for treatment or other patient management decisions. Negative results must be combined with clinical observations,  patient history, and epidemiological information. The expected result is Negative.  Fact Sheet for Patients: SugarRoll.be  Fact Sheet for Healthcare Providers: https://www.woods-mathews.com/  This test is not yet approved or cleared by the [place] FDA and  has been authorized for detection and/or diagnosis of SARS-CoV-2 by FDA under an Emergency Use Authorization (EUA). This EUA will remain  in effect (meaning this test can be used) for the duration of the COVID-19 declaration under Se ction 564(b)(1) of the Act, 21 U.S.C. section 360bbb-3(b)(1), unless the authorization is terminated or revoked sooner.  Performed at [facility], [place] [address] [place], [place] [address]      Time coordinating discharge: 35  minutes  SIGNED: Antonieta Pert, MD  [facility] 09/30/2019, 9:34 AM  If 7PM-7AM, please contact night-coverage [email or web address]

## 2019-09-30 NOTE — TOC Transition Note (Signed)
Transition of Care [facility]) - CM/SW Discharge Note   Patient Details  Name: EMIL KLASSEN MRN: [id number] Date of Birth: 1945/11/14  Transition of Care [facility]) CM/SW Contact:  Bary Castilla, LCSW Phone Number: [phone] 09/30/2019, 10:25 AM   Clinical Narrative:     Patient will DC to:?[facility] Anticipated DC date: 09/30/19 Family notified:?[name] left message Transport by: Corey Harold   Per MD patient ready for DC to [facility]?. RN, patient, patient's family, and facility notified of DC. Discharge Summary sent to facility. RN given number for report [place] room # 107. DC packet on chart. Ambulance transport requested for patient.   CSW signing off.   [place], [place] [phone]   Final next level of care: Skilled Nursing Facility Barriers to Discharge: No Barriers Identified   Patient Goals and CMS Choice Patient states their goals for this hospitalization and ongoing recovery are:: to go to SNF CMS Medicare.gov Compare Post Acute Care list provided to:: Patient Choice offered to / list presented to : Patient  Discharge Placement              Patient chooses bed at:  ([facility]) Patient to be transferred to facility by: [facility] Name of family member notified: [name] Patient and family notified of of transfer: 09/30/19  Discharge Plan and Services                                     Social Determinants of Health (SDOH) Interventions     Readmission Risk Interventions No flowsheet data found.

## 2019-09-30 NOTE — Progress Notes (Signed)
Report called to [place] at [facility].  Awaiting PTAR for d/c.

## 2019-10-01 DIAGNOSIS — J9622 Acute and chronic respiratory failure with hypercapnia: Secondary | ICD-10-CM | POA: Diagnosis not present

## 2019-10-01 DIAGNOSIS — E871 Hypo-osmolality and hyponatremia: Secondary | ICD-10-CM | POA: Diagnosis not present

## 2019-10-01 DIAGNOSIS — I5032 Chronic diastolic (congestive) heart failure: Secondary | ICD-10-CM | POA: Diagnosis not present

## 2019-10-01 DIAGNOSIS — I4891 Unspecified atrial fibrillation: Secondary | ICD-10-CM | POA: Diagnosis not present

## 2019-10-01 DIAGNOSIS — J9621 Acute and chronic respiratory failure with hypoxia: Secondary | ICD-10-CM | POA: Diagnosis not present

## 2019-10-01 DIAGNOSIS — E118 Type 2 diabetes mellitus with unspecified complications: Secondary | ICD-10-CM | POA: Diagnosis not present

## 2019-10-01 DIAGNOSIS — J441 Chronic obstructive pulmonary disease with (acute) exacerbation: Secondary | ICD-10-CM | POA: Diagnosis not present

## 2019-10-01 DIAGNOSIS — D638 Anemia in other chronic diseases classified elsewhere: Secondary | ICD-10-CM | POA: Diagnosis not present

## 2019-10-03 DIAGNOSIS — J449 Chronic obstructive pulmonary disease, unspecified: Secondary | ICD-10-CM | POA: Diagnosis not present

## 2019-10-05 DIAGNOSIS — J449 Chronic obstructive pulmonary disease, unspecified: Secondary | ICD-10-CM | POA: Diagnosis not present

## 2019-10-12 DIAGNOSIS — E785 Hyperlipidemia, unspecified: Secondary | ICD-10-CM | POA: Diagnosis not present

## 2019-10-12 DIAGNOSIS — I4891 Unspecified atrial fibrillation: Secondary | ICD-10-CM | POA: Diagnosis not present

## 2019-10-12 DIAGNOSIS — D638 Anemia in other chronic diseases classified elsewhere: Secondary | ICD-10-CM | POA: Diagnosis not present

## 2019-10-12 DIAGNOSIS — E118 Type 2 diabetes mellitus with unspecified complications: Secondary | ICD-10-CM | POA: Diagnosis not present

## 2019-10-12 DIAGNOSIS — J441 Chronic obstructive pulmonary disease with (acute) exacerbation: Secondary | ICD-10-CM | POA: Diagnosis not present

## 2019-10-12 DIAGNOSIS — I10 Essential (primary) hypertension: Secondary | ICD-10-CM | POA: Diagnosis not present

## 2019-10-12 DIAGNOSIS — J449 Chronic obstructive pulmonary disease, unspecified: Secondary | ICD-10-CM | POA: Diagnosis not present

## 2019-10-22 DIAGNOSIS — E785 Hyperlipidemia, unspecified: Secondary | ICD-10-CM | POA: Diagnosis not present

## 2019-10-22 DIAGNOSIS — I5032 Chronic diastolic (congestive) heart failure: Secondary | ICD-10-CM | POA: Diagnosis not present

## 2019-10-22 DIAGNOSIS — Z7984 Long term (current) use of oral hypoglycemic drugs: Secondary | ICD-10-CM | POA: Diagnosis not present

## 2019-10-22 DIAGNOSIS — J9612 Chronic respiratory failure with hypercapnia: Secondary | ICD-10-CM | POA: Diagnosis not present

## 2019-10-22 DIAGNOSIS — D649 Anemia, unspecified: Secondary | ICD-10-CM | POA: Diagnosis not present

## 2019-10-22 DIAGNOSIS — E119 Type 2 diabetes mellitus without complications: Secondary | ICD-10-CM | POA: Diagnosis not present

## 2019-10-22 DIAGNOSIS — J9611 Chronic respiratory failure with hypoxia: Secondary | ICD-10-CM | POA: Diagnosis not present

## 2019-10-22 DIAGNOSIS — J449 Chronic obstructive pulmonary disease, unspecified: Secondary | ICD-10-CM | POA: Diagnosis not present

## 2019-10-22 DIAGNOSIS — I4891 Unspecified atrial fibrillation: Secondary | ICD-10-CM | POA: Diagnosis not present

## 2019-10-22 DIAGNOSIS — I110 Hypertensive heart disease with heart failure: Secondary | ICD-10-CM | POA: Diagnosis not present

## 2019-10-22 DIAGNOSIS — Z9981 Dependence on supplemental oxygen: Secondary | ICD-10-CM | POA: Diagnosis not present

## 2019-10-31 DIAGNOSIS — J9612 Chronic respiratory failure with hypercapnia: Secondary | ICD-10-CM | POA: Diagnosis not present

## 2019-10-31 DIAGNOSIS — E119 Type 2 diabetes mellitus without complications: Secondary | ICD-10-CM | POA: Diagnosis not present

## 2019-10-31 DIAGNOSIS — E785 Hyperlipidemia, unspecified: Secondary | ICD-10-CM | POA: Diagnosis not present

## 2019-10-31 DIAGNOSIS — I4891 Unspecified atrial fibrillation: Secondary | ICD-10-CM | POA: Diagnosis not present

## 2019-10-31 DIAGNOSIS — I5032 Chronic diastolic (congestive) heart failure: Secondary | ICD-10-CM | POA: Diagnosis not present

## 2019-10-31 DIAGNOSIS — J449 Chronic obstructive pulmonary disease, unspecified: Secondary | ICD-10-CM | POA: Diagnosis not present

## 2019-10-31 DIAGNOSIS — D649 Anemia, unspecified: Secondary | ICD-10-CM | POA: Diagnosis not present

## 2019-10-31 DIAGNOSIS — J9611 Chronic respiratory failure with hypoxia: Secondary | ICD-10-CM | POA: Diagnosis not present

## 2019-10-31 DIAGNOSIS — I110 Hypertensive heart disease with heart failure: Secondary | ICD-10-CM | POA: Diagnosis not present

## 2019-10-31 DIAGNOSIS — Z9981 Dependence on supplemental oxygen: Secondary | ICD-10-CM | POA: Diagnosis not present

## 2019-11-02 DIAGNOSIS — J449 Chronic obstructive pulmonary disease, unspecified: Secondary | ICD-10-CM | POA: Diagnosis not present

## 2019-11-05 DIAGNOSIS — E785 Hyperlipidemia, unspecified: Secondary | ICD-10-CM | POA: Diagnosis not present

## 2019-11-05 DIAGNOSIS — Z9981 Dependence on supplemental oxygen: Secondary | ICD-10-CM | POA: Diagnosis not present

## 2019-11-05 DIAGNOSIS — J9612 Chronic respiratory failure with hypercapnia: Secondary | ICD-10-CM | POA: Diagnosis not present

## 2019-11-05 DIAGNOSIS — I110 Hypertensive heart disease with heart failure: Secondary | ICD-10-CM | POA: Diagnosis not present

## 2019-11-05 DIAGNOSIS — J9611 Chronic respiratory failure with hypoxia: Secondary | ICD-10-CM | POA: Diagnosis not present

## 2019-11-05 DIAGNOSIS — J449 Chronic obstructive pulmonary disease, unspecified: Secondary | ICD-10-CM | POA: Diagnosis not present

## 2019-11-05 DIAGNOSIS — D649 Anemia, unspecified: Secondary | ICD-10-CM | POA: Diagnosis not present

## 2019-11-05 DIAGNOSIS — I5032 Chronic diastolic (congestive) heart failure: Secondary | ICD-10-CM | POA: Diagnosis not present

## 2019-11-05 DIAGNOSIS — E119 Type 2 diabetes mellitus without complications: Secondary | ICD-10-CM | POA: Diagnosis not present

## 2019-11-05 DIAGNOSIS — I4891 Unspecified atrial fibrillation: Secondary | ICD-10-CM | POA: Diagnosis not present

## 2019-11-07 DIAGNOSIS — J439 Emphysema, unspecified: Secondary | ICD-10-CM | POA: Diagnosis not present

## 2019-11-07 DIAGNOSIS — Z79899 Other long term (current) drug therapy: Secondary | ICD-10-CM | POA: Diagnosis not present

## 2019-11-07 DIAGNOSIS — D649 Anemia, unspecified: Secondary | ICD-10-CM | POA: Diagnosis not present

## 2019-11-07 DIAGNOSIS — I4891 Unspecified atrial fibrillation: Secondary | ICD-10-CM | POA: Diagnosis not present

## 2019-11-07 DIAGNOSIS — R69 Illness, unspecified: Secondary | ICD-10-CM | POA: Diagnosis not present

## 2019-11-07 DIAGNOSIS — E1142 Type 2 diabetes mellitus with diabetic polyneuropathy: Secondary | ICD-10-CM | POA: Diagnosis not present

## 2019-11-07 DIAGNOSIS — I10 Essential (primary) hypertension: Secondary | ICD-10-CM | POA: Diagnosis not present

## 2019-11-07 DIAGNOSIS — E7800 Pure hypercholesterolemia, unspecified: Secondary | ICD-10-CM | POA: Diagnosis not present

## 2019-11-08 DIAGNOSIS — D649 Anemia, unspecified: Secondary | ICD-10-CM | POA: Diagnosis not present

## 2019-11-08 DIAGNOSIS — E785 Hyperlipidemia, unspecified: Secondary | ICD-10-CM | POA: Diagnosis not present

## 2019-11-08 DIAGNOSIS — J9611 Chronic respiratory failure with hypoxia: Secondary | ICD-10-CM | POA: Diagnosis not present

## 2019-11-08 DIAGNOSIS — E119 Type 2 diabetes mellitus without complications: Secondary | ICD-10-CM | POA: Diagnosis not present

## 2019-11-08 DIAGNOSIS — I5032 Chronic diastolic (congestive) heart failure: Secondary | ICD-10-CM | POA: Diagnosis not present

## 2019-11-08 DIAGNOSIS — I4891 Unspecified atrial fibrillation: Secondary | ICD-10-CM | POA: Diagnosis not present

## 2019-11-08 DIAGNOSIS — J9612 Chronic respiratory failure with hypercapnia: Secondary | ICD-10-CM | POA: Diagnosis not present

## 2019-11-08 DIAGNOSIS — I110 Hypertensive heart disease with heart failure: Secondary | ICD-10-CM | POA: Diagnosis not present

## 2019-11-08 DIAGNOSIS — J449 Chronic obstructive pulmonary disease, unspecified: Secondary | ICD-10-CM | POA: Diagnosis not present

## 2019-11-08 DIAGNOSIS — Z9981 Dependence on supplemental oxygen: Secondary | ICD-10-CM | POA: Diagnosis not present

## 2019-11-13 DIAGNOSIS — D649 Anemia, unspecified: Secondary | ICD-10-CM | POA: Diagnosis not present

## 2019-11-13 DIAGNOSIS — Z9981 Dependence on supplemental oxygen: Secondary | ICD-10-CM | POA: Diagnosis not present

## 2019-11-13 DIAGNOSIS — E785 Hyperlipidemia, unspecified: Secondary | ICD-10-CM | POA: Diagnosis not present

## 2019-11-13 DIAGNOSIS — I110 Hypertensive heart disease with heart failure: Secondary | ICD-10-CM | POA: Diagnosis not present

## 2019-11-13 DIAGNOSIS — J449 Chronic obstructive pulmonary disease, unspecified: Secondary | ICD-10-CM | POA: Diagnosis not present

## 2019-11-13 DIAGNOSIS — J9612 Chronic respiratory failure with hypercapnia: Secondary | ICD-10-CM | POA: Diagnosis not present

## 2019-11-13 DIAGNOSIS — I5032 Chronic diastolic (congestive) heart failure: Secondary | ICD-10-CM | POA: Diagnosis not present

## 2019-11-13 DIAGNOSIS — E119 Type 2 diabetes mellitus without complications: Secondary | ICD-10-CM | POA: Diagnosis not present

## 2019-11-13 DIAGNOSIS — J9611 Chronic respiratory failure with hypoxia: Secondary | ICD-10-CM | POA: Diagnosis not present

## 2019-11-13 DIAGNOSIS — I4891 Unspecified atrial fibrillation: Secondary | ICD-10-CM | POA: Diagnosis not present

## 2019-11-16 ENCOUNTER — Encounter: Payer: Self-pay | Admitting: Family Medicine

## 2019-11-22 DIAGNOSIS — D649 Anemia, unspecified: Secondary | ICD-10-CM | POA: Diagnosis not present

## 2019-11-22 DIAGNOSIS — I110 Hypertensive heart disease with heart failure: Secondary | ICD-10-CM | POA: Diagnosis not present

## 2019-11-22 DIAGNOSIS — Z9981 Dependence on supplemental oxygen: Secondary | ICD-10-CM | POA: Diagnosis not present

## 2019-11-22 DIAGNOSIS — E119 Type 2 diabetes mellitus without complications: Secondary | ICD-10-CM | POA: Diagnosis not present

## 2019-11-22 DIAGNOSIS — J449 Chronic obstructive pulmonary disease, unspecified: Secondary | ICD-10-CM | POA: Diagnosis not present

## 2019-11-22 DIAGNOSIS — E785 Hyperlipidemia, unspecified: Secondary | ICD-10-CM | POA: Diagnosis not present

## 2019-11-22 DIAGNOSIS — J9611 Chronic respiratory failure with hypoxia: Secondary | ICD-10-CM | POA: Diagnosis not present

## 2019-11-22 DIAGNOSIS — J9612 Chronic respiratory failure with hypercapnia: Secondary | ICD-10-CM | POA: Diagnosis not present

## 2019-11-22 DIAGNOSIS — I4891 Unspecified atrial fibrillation: Secondary | ICD-10-CM | POA: Diagnosis not present

## 2019-11-22 DIAGNOSIS — I5032 Chronic diastolic (congestive) heart failure: Secondary | ICD-10-CM | POA: Diagnosis not present

## 2019-12-03 DIAGNOSIS — J449 Chronic obstructive pulmonary disease, unspecified: Secondary | ICD-10-CM | POA: Diagnosis not present

## 2019-12-12 DIAGNOSIS — E611 Iron deficiency: Secondary | ICD-10-CM | POA: Diagnosis not present

## 2019-12-21 DIAGNOSIS — Z01818 Encounter for other preprocedural examination: Secondary | ICD-10-CM | POA: Diagnosis not present

## 2019-12-21 DIAGNOSIS — H25812 Combined forms of age-related cataract, left eye: Secondary | ICD-10-CM | POA: Diagnosis not present

## 2019-12-21 DIAGNOSIS — R69 Illness, unspecified: Secondary | ICD-10-CM | POA: Diagnosis not present

## 2019-12-21 DIAGNOSIS — H43822 Vitreomacular adhesion, left eye: Secondary | ICD-10-CM | POA: Diagnosis not present

## 2019-12-21 DIAGNOSIS — H25811 Combined forms of age-related cataract, right eye: Secondary | ICD-10-CM | POA: Diagnosis not present

## 2020-01-02 DIAGNOSIS — J449 Chronic obstructive pulmonary disease, unspecified: Secondary | ICD-10-CM | POA: Diagnosis not present

## 2020-01-14 DIAGNOSIS — I2510 Atherosclerotic heart disease of native coronary artery without angina pectoris: Secondary | ICD-10-CM | POA: Diagnosis not present

## 2020-01-14 DIAGNOSIS — E1142 Type 2 diabetes mellitus with diabetic polyneuropathy: Secondary | ICD-10-CM | POA: Diagnosis not present

## 2020-01-14 DIAGNOSIS — I4891 Unspecified atrial fibrillation: Secondary | ICD-10-CM | POA: Diagnosis not present

## 2020-01-14 DIAGNOSIS — I10 Essential (primary) hypertension: Secondary | ICD-10-CM | POA: Diagnosis not present

## 2020-01-14 DIAGNOSIS — J439 Emphysema, unspecified: Secondary | ICD-10-CM | POA: Diagnosis not present

## 2020-02-02 DIAGNOSIS — J449 Chronic obstructive pulmonary disease, unspecified: Secondary | ICD-10-CM | POA: Diagnosis not present

## 2020-02-28 DIAGNOSIS — H25812 Combined forms of age-related cataract, left eye: Secondary | ICD-10-CM | POA: Diagnosis not present

## 2020-02-28 DIAGNOSIS — H2512 Age-related nuclear cataract, left eye: Secondary | ICD-10-CM | POA: Diagnosis not present

## 2020-03-03 DIAGNOSIS — J449 Chronic obstructive pulmonary disease, unspecified: Secondary | ICD-10-CM | POA: Diagnosis not present

## 2020-03-20 DIAGNOSIS — H25811 Combined forms of age-related cataract, right eye: Secondary | ICD-10-CM | POA: Diagnosis not present

## 2020-03-20 DIAGNOSIS — H2511 Age-related nuclear cataract, right eye: Secondary | ICD-10-CM | POA: Diagnosis not present

## 2020-04-01 DIAGNOSIS — J449 Chronic obstructive pulmonary disease, unspecified: Secondary | ICD-10-CM | POA: Diagnosis not present

## 2020-05-02 DIAGNOSIS — J449 Chronic obstructive pulmonary disease, unspecified: Secondary | ICD-10-CM | POA: Diagnosis not present

## 2020-05-22 DIAGNOSIS — I10 Essential (primary) hypertension: Secondary | ICD-10-CM | POA: Diagnosis not present

## 2020-05-22 DIAGNOSIS — J439 Emphysema, unspecified: Secondary | ICD-10-CM | POA: Diagnosis not present

## 2020-05-22 DIAGNOSIS — I5032 Chronic diastolic (congestive) heart failure: Secondary | ICD-10-CM | POA: Diagnosis not present

## 2020-05-22 DIAGNOSIS — Z0001 Encounter for general adult medical examination with abnormal findings: Secondary | ICD-10-CM | POA: Diagnosis not present

## 2020-05-22 DIAGNOSIS — K219 Gastro-esophageal reflux disease without esophagitis: Secondary | ICD-10-CM | POA: Diagnosis not present

## 2020-05-22 DIAGNOSIS — R69 Illness, unspecified: Secondary | ICD-10-CM | POA: Diagnosis not present

## 2020-05-22 DIAGNOSIS — J9611 Chronic respiratory failure with hypoxia: Secondary | ICD-10-CM | POA: Diagnosis not present

## 2020-05-22 DIAGNOSIS — Z79899 Other long term (current) drug therapy: Secondary | ICD-10-CM | POA: Diagnosis not present

## 2020-05-22 DIAGNOSIS — E611 Iron deficiency: Secondary | ICD-10-CM | POA: Diagnosis not present

## 2020-05-22 DIAGNOSIS — I4891 Unspecified atrial fibrillation: Secondary | ICD-10-CM | POA: Diagnosis not present

## 2020-05-22 DIAGNOSIS — I2510 Atherosclerotic heart disease of native coronary artery without angina pectoris: Secondary | ICD-10-CM | POA: Diagnosis not present

## 2020-05-22 DIAGNOSIS — E1142 Type 2 diabetes mellitus with diabetic polyneuropathy: Secondary | ICD-10-CM | POA: Diagnosis not present

## 2020-05-27 ENCOUNTER — Encounter: Payer: Self-pay | Admitting: Acute Care

## 2020-06-01 DIAGNOSIS — J449 Chronic obstructive pulmonary disease, unspecified: Secondary | ICD-10-CM | POA: Diagnosis not present

## 2020-06-30 ENCOUNTER — Other Ambulatory Visit: Payer: Self-pay | Admitting: *Deleted

## 2020-06-30 NOTE — Patient Outreach (Signed)
[facility]) Care Management  06/30/2020  LORE POLKA [birth year] [id number]  Referral Received 6/16 Initial Outreach: Telephone Screen   RN attempted outreach call today however unsuccessful. RN able to leave a HIPAA approved voice message requesting a call back.  Will follow up over the next week with possible needs for [facility] services.  Raina Mina, RN Care Management Coordinator [place] Office [phone]

## 2020-07-02 DIAGNOSIS — J449 Chronic obstructive pulmonary disease, unspecified: Secondary | ICD-10-CM | POA: Diagnosis not present

## 2020-07-18 ENCOUNTER — Other Ambulatory Visit: Payer: Self-pay | Admitting: *Deleted

## 2020-07-18 NOTE — Patient Outreach (Signed)
[facility]) Care Management  07/18/2020  [name] 02/03/1945 [id number]  [facility] unsuccessful outreach  Coverage for [name] RN CCM  Referral Received [phone]/[id number] Initial Outreach: Telephone Screen     RN attempted outreach call today however unsuccessful. RN able to leave a HIPAA approved voice message requesting a call back.   Plan Pt will be followed up over the next by assigned RN CM for with possible needs for [facility] services.  Unsuccessful outreaches on 06/30/20 and 07/18/20 Unsuccessful letter had been mailed on 06/30/20  [name] L. Lavina Hamman, RN, BSN, [place] Coordinator Office number [phone] Main [facility] number [phone] Fax number [phone]

## 2020-07-22 ENCOUNTER — Encounter (HOSPITAL_COMMUNITY): Payer: Self-pay | Admitting: Emergency Medicine

## 2020-07-22 ENCOUNTER — Inpatient Hospital Stay (HOSPITAL_COMMUNITY)
Admission: EM | Admit: 2020-07-22 | Discharge: 2020-07-28 | DRG: 193 | Disposition: A | Discharge status: Home Health | Payer: Medicare HMO | Attending: Internal Medicine | Admitting: Internal Medicine

## 2020-07-22 ENCOUNTER — Emergency Department (HOSPITAL_COMMUNITY): Payer: Medicare HMO

## 2020-07-22 ENCOUNTER — Other Ambulatory Visit: Payer: Self-pay

## 2020-07-22 DIAGNOSIS — J189 Pneumonia, unspecified organism: Secondary | ICD-10-CM | POA: Diagnosis not present

## 2020-07-22 DIAGNOSIS — D7589 Other specified diseases of blood and blood-forming organs: Secondary | ICD-10-CM | POA: Diagnosis present

## 2020-07-22 DIAGNOSIS — E162 Hypoglycemia, unspecified: Secondary | ICD-10-CM | POA: Diagnosis not present

## 2020-07-22 DIAGNOSIS — E1165 Type 2 diabetes mellitus with hyperglycemia: Secondary | ICD-10-CM | POA: Diagnosis present

## 2020-07-22 DIAGNOSIS — Z9981 Dependence on supplemental oxygen: Secondary | ICD-10-CM | POA: Diagnosis not present

## 2020-07-22 DIAGNOSIS — Z8249 Family history of ischemic heart disease and other diseases of the circulatory system: Secondary | ICD-10-CM

## 2020-07-22 DIAGNOSIS — J9602 Acute respiratory failure with hypercapnia: Secondary | ICD-10-CM | POA: Diagnosis not present

## 2020-07-22 DIAGNOSIS — F1010 Alcohol abuse, uncomplicated: Secondary | ICD-10-CM | POA: Diagnosis present

## 2020-07-22 DIAGNOSIS — E871 Hypo-osmolality and hyponatremia: Secondary | ICD-10-CM | POA: Diagnosis present

## 2020-07-22 DIAGNOSIS — J9601 Acute respiratory failure with hypoxia: Secondary | ICD-10-CM | POA: Diagnosis not present

## 2020-07-22 DIAGNOSIS — E875 Hyperkalemia: Secondary | ICD-10-CM | POA: Diagnosis present

## 2020-07-22 DIAGNOSIS — J9621 Acute and chronic respiratory failure with hypoxia: Secondary | ICD-10-CM | POA: Diagnosis present

## 2020-07-22 DIAGNOSIS — Z7982 Long term (current) use of aspirin: Secondary | ICD-10-CM

## 2020-07-22 DIAGNOSIS — E119 Type 2 diabetes mellitus without complications: Secondary | ICD-10-CM | POA: Diagnosis not present

## 2020-07-22 DIAGNOSIS — Z720 Tobacco use: Secondary | ICD-10-CM

## 2020-07-22 DIAGNOSIS — I5032 Chronic diastolic (congestive) heart failure: Secondary | ICD-10-CM | POA: Diagnosis present

## 2020-07-22 DIAGNOSIS — J9622 Acute and chronic respiratory failure with hypercapnia: Secondary | ICD-10-CM | POA: Diagnosis present

## 2020-07-22 DIAGNOSIS — E785 Hyperlipidemia, unspecified: Secondary | ICD-10-CM | POA: Diagnosis present

## 2020-07-22 DIAGNOSIS — Z7951 Long term (current) use of inhaled steroids: Secondary | ICD-10-CM

## 2020-07-22 DIAGNOSIS — Z794 Long term (current) use of insulin: Secondary | ICD-10-CM | POA: Diagnosis not present

## 2020-07-22 DIAGNOSIS — F17210 Nicotine dependence, cigarettes, uncomplicated: Secondary | ICD-10-CM | POA: Diagnosis present

## 2020-07-22 DIAGNOSIS — J441 Chronic obstructive pulmonary disease with (acute) exacerbation: Secondary | ICD-10-CM | POA: Diagnosis present

## 2020-07-22 DIAGNOSIS — E161 Other hypoglycemia: Secondary | ICD-10-CM | POA: Diagnosis not present

## 2020-07-22 DIAGNOSIS — Z801 Family history of malignant neoplasm of trachea, bronchus and lung: Secondary | ICD-10-CM

## 2020-07-22 DIAGNOSIS — J440 Chronic obstructive pulmonary disease with acute lower respiratory infection: Secondary | ICD-10-CM | POA: Diagnosis not present

## 2020-07-22 DIAGNOSIS — Z20822 Contact with and (suspected) exposure to covid-19: Secondary | ICD-10-CM | POA: Diagnosis not present

## 2020-07-22 DIAGNOSIS — Z743 Need for continuous supervision: Secondary | ICD-10-CM | POA: Diagnosis not present

## 2020-07-22 DIAGNOSIS — Z7984 Long term (current) use of oral hypoglycemic drugs: Secondary | ICD-10-CM

## 2020-07-22 DIAGNOSIS — I110 Hypertensive heart disease with heart failure: Secondary | ICD-10-CM | POA: Diagnosis present

## 2020-07-22 DIAGNOSIS — D6959 Other secondary thrombocytopenia: Secondary | ICD-10-CM | POA: Diagnosis present

## 2020-07-22 DIAGNOSIS — J80 Acute respiratory distress syndrome: Secondary | ICD-10-CM | POA: Diagnosis not present

## 2020-07-22 DIAGNOSIS — I480 Paroxysmal atrial fibrillation: Secondary | ICD-10-CM | POA: Diagnosis present

## 2020-07-22 DIAGNOSIS — J90 Pleural effusion, not elsewhere classified: Secondary | ICD-10-CM

## 2020-07-22 DIAGNOSIS — I10 Essential (primary) hypertension: Secondary | ICD-10-CM | POA: Diagnosis not present

## 2020-07-22 DIAGNOSIS — R0602 Shortness of breath: Secondary | ICD-10-CM | POA: Diagnosis not present

## 2020-07-22 LAB — BASIC METABOLIC PANEL
Anion gap: 13 (ref 5–15)
Anion gap: 15 (ref 5–15)
Anion gap: 12 (ref 5–15)
BUN: 17 mg/dL (ref 8–23)
BUN: 18 mg/dL (ref 8–23)
BUN: 23 mg/dL (ref 8–23)
CO2: 38 mmol/L — ABNORMAL HIGH (ref 22–32)
CO2: 37 mmol/L — ABNORMAL HIGH (ref 22–32)
CO2: 36 mmol/L — ABNORMAL HIGH (ref 22–32)
Calcium: 9.2 mg/dL (ref 8.9–10.3)
Calcium: 9.3 mg/dL (ref 8.9–10.3)
Calcium: 9.1 mg/dL (ref 8.9–10.3)
Chloride: 76 mmol/L — ABNORMAL LOW (ref 98–111)
Chloride: 75 mmol/L — ABNORMAL LOW (ref 98–111)
Chloride: 77 mmol/L — ABNORMAL LOW (ref 98–111)
Creatinine, Ser: 0.58 mg/dL (ref 0.44–1.00)
Creatinine, Ser: 0.73 mg/dL (ref 0.44–1.00)
Creatinine, Ser: 0.68 mg/dL (ref 0.44–1.00)
GFR, Estimated: >60 mL/min (ref >60)
GFR, Estimated: >60 mL/min (ref >60)
GFR, Estimated: >60 mL/min (ref >60)
Glucose, Bld: 84 mg/dL (ref 70–99)
Glucose, Bld: 141 mg/dL — ABNORMAL HIGH (ref 70–99)
Glucose, Bld: 153 mg/dL — ABNORMAL HIGH (ref 70–99)
Potassium: 4.7 mmol/L (ref 3.5–5.1)
Potassium: 4.9 mmol/L (ref 3.5–5.1)
Potassium: 4.8 mmol/L (ref 3.5–5.1)
Sodium: 127 mmol/L — ABNORMAL LOW (ref 135–145)
Sodium: 127 mmol/L — ABNORMAL LOW (ref 135–145)
Sodium: 125 mmol/L — ABNORMAL LOW (ref 135–145)

## 2020-07-22 LAB — CBC WITH DIFFERENTIAL/PLATELET
Abs Immature Granulocytes: 0.01 10*3/uL (ref 0.00–0.07)
Basophils Absolute: 0 10*3/uL (ref 0.0–0.1)
Basophils Relative: 0 %
Eosinophils Absolute: 0 10*3/uL (ref 0.0–0.5)
Eosinophils Relative: 0 %
HCT: 43 % (ref 36.0–46.0)
Hemoglobin: 14.1 g/dL (ref 12.0–15.0)
Immature Granulocytes: 0 %
Lymphocytes Relative: 18 %
Lymphs Abs: 1 10*3/uL (ref 0.7–4.0)
MCH: 33.4 pg (ref 26.0–34.0)
MCHC: 32.8 g/dL (ref 30.0–36.0)
MCV: 101.9 fL — ABNORMAL HIGH (ref 80.0–100.0)
Monocytes Absolute: 0.4 10*3/uL (ref 0.1–1.0)
Monocytes Relative: 8 %
Neutro Abs: 3.9 10*3/uL (ref 1.7–7.7)
Neutrophils Relative %: 74 %
Platelets: 130 10*3/uL — ABNORMAL LOW (ref 150–400)
RBC: 4.22 MIL/uL (ref 3.87–5.11)
RDW: 12.9 % (ref 11.5–15.5)
WBC: 5.3 10*3/uL (ref 4.0–10.5)
nRBC: 0 % (ref 0.0–0.2)

## 2020-07-22 LAB — BRAIN NATRIURETIC PEPTIDE: B Natriuretic Peptide: 237.6 pg/mL — ABNORMAL HIGH (ref 0.0–100.0)

## 2020-07-22 LAB — CBC
HCT: 41.4 % (ref 36.0–46.0)
Hemoglobin: 13.6 g/dL (ref 12.0–15.0)
MCH: 33.4 pg (ref 26.0–34.0)
MCHC: 32.9 g/dL (ref 30.0–36.0)
MCV: 101.7 fL — ABNORMAL HIGH (ref 80.0–100.0)
Platelets: 129 10*3/uL — ABNORMAL LOW (ref 150–400)
RBC: 4.07 MIL/uL (ref 3.87–5.11)
RDW: 12.9 % (ref 11.5–15.5)
WBC: 4.8 10*3/uL (ref 4.0–10.5)
nRBC: 0 % (ref 0.0–0.2)

## 2020-07-22 LAB — LIPASE, BLOOD: Lipase: 37 U/L (ref 11–51)

## 2020-07-22 LAB — GLUCOSE, CAPILLARY
Glucose-Capillary: 161 mg/dL — ABNORMAL HIGH (ref 70–99)
Glucose-Capillary: 268 mg/dL — ABNORMAL HIGH (ref 70–99)

## 2020-07-22 LAB — TSH: TSH: 1.145 u[IU]/mL (ref 0.350–4.500)

## 2020-07-22 LAB — BLOOD GAS, ARTERIAL
Acid-Base Excess: 9 mmol/L — ABNORMAL HIGH (ref 0.0–2.0)
Bicarbonate: 39.6 mmol/L — ABNORMAL HIGH (ref 20.0–28.0)
O2 Saturation: 90.4 %
Patient temperature: 98.6
pCO2 arterial: 89.4 mmHg (ref 32.0–48.0)
pH, Arterial: 7.269 — ABNORMAL LOW (ref 7.350–7.450)
pO2, Arterial: 68.2 mmHg — ABNORMAL LOW (ref 83.0–108.0)

## 2020-07-22 LAB — RESP PANEL BY RT-PCR (FLU A&B, COVID) ARPGX2
Influenza A by PCR: NEGATIVE
Influenza B by PCR: NEGATIVE
SARS Coronavirus 2 by RT PCR: NEGATIVE

## 2020-07-22 LAB — TROPONIN I (HIGH SENSITIVITY): Troponin I (High Sensitivity): 6 ng/L (ref <18)

## 2020-07-22 LAB — LACTIC ACID, PLASMA: Lactic Acid, Venous: 1 mmol/L (ref 0.5–1.9)

## 2020-07-22 MED ORDER — PANTOPRAZOLE SODIUM 40 MG PO TBEC
40.0000 mg | DELAYED_RELEASE_TABLET | Freq: Every day | ORAL | Status: DC
  Administered 2020-07-22 – 2020-07-28 (×7): 40 mg via ORAL
  Filled 2020-07-22 (×7): qty 1

## 2020-07-22 MED ORDER — METHYLPREDNISOLONE SODIUM SUCC 40 MG IJ SOLR
40.0000 mg | Freq: Two times a day (BID) | INTRAMUSCULAR | Status: DC
  Administered 2020-07-22 – 2020-07-24 (×5): 40 mg via INTRAVENOUS
  Filled 2020-07-22 (×5): qty 1

## 2020-07-22 MED ORDER — BUDESONIDE 0.25 MG/2ML IN SUSP
0.2500 mg | Freq: Two times a day (BID) | RESPIRATORY_TRACT | Status: DC
Start: 2020-07-22 — End: 2020-07-28
  Administered 2020-07-22 – 2020-07-28 (×11): 0.25 mg via RESPIRATORY_TRACT
  Filled 2020-07-22 (×12): qty 2

## 2020-07-22 MED ORDER — ALBUTEROL SULFATE (2.5 MG/3ML) 0.083% IN NEBU: 2.5000 mg | INHALATION_SOLUTION | Freq: Four times a day (QID) | RESPIRATORY_TRACT | Status: DC

## 2020-07-22 MED ORDER — LORAZEPAM 2 MG/ML IJ SOLN
0.5000 mg | Freq: Once | INTRAMUSCULAR | Status: AC
  Administered 2020-07-22: 0.5 mg via INTRAVENOUS
  Filled 2020-07-22: qty 1

## 2020-07-22 MED ORDER — IPRATROPIUM BROMIDE 0.02 % IN SOLN: 0.5000 mg | Freq: Four times a day (QID) | RESPIRATORY_TRACT | Status: DC

## 2020-07-22 MED ORDER — SODIUM CHLORIDE 0.9 % IV SOLN
2.0000 g | Freq: Every day | INTRAVENOUS | Status: DC
  Administered 2020-07-23 – 2020-07-27 (×5): 2 g via INTRAVENOUS
  Filled 2020-07-22 (×5): qty 20

## 2020-07-22 MED ORDER — ACETAMINOPHEN 325 MG PO TABS
650.0000 mg | ORAL_TABLET | Freq: Four times a day (QID) | ORAL | Status: DC | PRN
Start: 2020-07-22 — End: 2020-07-23

## 2020-07-22 MED ORDER — IRBESARTAN 300 MG PO TABS
300.0000 mg | ORAL_TABLET | Freq: Every day | ORAL | Status: DC
  Administered 2020-07-22 – 2020-07-23 (×2): 300 mg via ORAL
  Filled 2020-07-22 (×2): qty 1

## 2020-07-22 MED ORDER — INSULIN ASPART 100 UNIT/ML IJ SOLN
0.0000 [IU] | Freq: Three times a day (TID) | INTRAMUSCULAR | Status: DC
  Administered 2020-07-22: 5 [IU] via SUBCUTANEOUS
  Administered 2020-07-23 – 2020-07-24 (×2): 2 [IU] via SUBCUTANEOUS
  Administered 2020-07-24: 1 [IU] via SUBCUTANEOUS
  Administered 2020-07-24: 5 [IU] via SUBCUTANEOUS
  Administered 2020-07-25: 3 [IU] via SUBCUTANEOUS
  Administered 2020-07-25 (×2): 2 [IU] via SUBCUTANEOUS
  Administered 2020-07-26: 1 [IU] via SUBCUTANEOUS
  Administered 2020-07-26: 2 [IU] via SUBCUTANEOUS
  Administered 2020-07-26: 3 [IU] via SUBCUTANEOUS
  Administered 2020-07-27: 2 [IU] via SUBCUTANEOUS
  Administered 2020-07-27 (×2): 3 [IU] via SUBCUTANEOUS
  Administered 2020-07-28: 1 [IU] via SUBCUTANEOUS

## 2020-07-22 MED ORDER — ACETAMINOPHEN 650 MG RE SUPP: 650.0000 mg | Freq: Four times a day (QID) | RECTAL | Status: DC | PRN

## 2020-07-22 MED ORDER — ASPIRIN EC 81 MG PO TBEC
81.0000 mg | DELAYED_RELEASE_TABLET | Freq: Every day | ORAL | Status: DC
  Administered 2020-07-22 – 2020-07-28 (×7): 81 mg via ORAL
  Filled 2020-07-22 (×7): qty 1

## 2020-07-22 MED ORDER — IPRATROPIUM-ALBUTEROL 0.5-2.5 (3) MG/3ML IN SOLN
3.0000 mL | Freq: Four times a day (QID) | RESPIRATORY_TRACT | Status: DC
  Administered 2020-07-22 – 2020-07-28 (×23): 3 mL via RESPIRATORY_TRACT
  Filled 2020-07-22 (×25): qty 3

## 2020-07-22 MED ORDER — ATORVASTATIN CALCIUM 20 MG PO TABS
20.0000 mg | ORAL_TABLET | Freq: Every evening | ORAL | Status: DC
  Administered 2020-07-22 – 2020-07-27 (×6): 20 mg via ORAL
  Filled 2020-07-22 (×2): qty 2
  Filled 2020-07-22: qty 1
  Filled 2020-07-22 (×2): qty 2

## 2020-07-22 MED ORDER — HYDRALAZINE HCL 20 MG/ML IJ SOLN
10.0000 mg | INTRAMUSCULAR | Status: DC | PRN
  Administered 2020-07-22 – 2020-07-24 (×5): 10 mg via INTRAVENOUS
  Filled 2020-07-22 (×5): qty 1

## 2020-07-22 MED ORDER — METOPROLOL SUCCINATE ER 25 MG PO TB24
25.0000 mg | ORAL_TABLET | Freq: Every day | ORAL | Status: DC
  Administered 2020-07-22 – 2020-07-23 (×2): 25 mg via ORAL
  Filled 2020-07-22 (×2): qty 1

## 2020-07-22 MED ORDER — CHLORHEXIDINE GLUCONATE 0.12 % MT SOLN
15.0000 mL | Freq: Two times a day (BID) | OROMUCOSAL | Status: DC
  Administered 2020-07-22 – 2020-07-28 (×13): 15 mL via OROMUCOSAL
  Filled 2020-07-22 (×12): qty 15

## 2020-07-22 MED ORDER — CHLORHEXIDINE GLUCONATE CLOTH 2 % EX PADS
6.0000 | MEDICATED_PAD | Freq: Every day | CUTANEOUS | Status: DC
  Administered 2020-07-22 – 2020-07-27 (×6): 6 via TOPICAL

## 2020-07-22 MED ORDER — SODIUM CHLORIDE 0.9 % IV SOLN
1.0000 g | Freq: Once | INTRAVENOUS | Status: AC
  Administered 2020-07-22: 1 g via INTRAVENOUS
  Filled 2020-07-22: qty 10

## 2020-07-22 MED ORDER — ALBUTEROL SULFATE (2.5 MG/3ML) 0.083% IN NEBU
2.5000 mg | INHALATION_SOLUTION | RESPIRATORY_TRACT | Status: DC | PRN
  Administered 2020-07-23: 2.5 mg via RESPIRATORY_TRACT
  Filled 2020-07-22: qty 3

## 2020-07-22 MED ORDER — AEROCHAMBER Z-STAT PLUS/MEDIUM MISC
1.0000 | Freq: Once | Status: AC
  Administered 2020-07-22: 1
  Filled 2020-07-22: qty 1

## 2020-07-22 MED ORDER — SODIUM CHLORIDE 0.9 % IV SOLN
500.0000 mg | INTRAVENOUS | Status: DC
  Administered 2020-07-22 – 2020-07-24 (×3): 500 mg via INTRAVENOUS
  Filled 2020-07-22 (×3): qty 500

## 2020-07-22 MED ORDER — ALBUTEROL SULFATE HFA 108 (90 BASE) MCG/ACT IN AERS
4.0000 | INHALATION_SPRAY | Freq: Once | RESPIRATORY_TRACT | Status: AC
  Administered 2020-07-22: 4 via RESPIRATORY_TRACT
  Filled 2020-07-22: qty 6.7

## 2020-07-22 MED ORDER — FUROSEMIDE 10 MG/ML IJ SOLN
40.0000 mg | Freq: Once | INTRAMUSCULAR | Status: AC
  Administered 2020-07-22: 40 mg via INTRAVENOUS
  Filled 2020-07-22: qty 4

## 2020-07-22 MED ORDER — AMLODIPINE BESYLATE-VALSARTAN 10-320 MG PO TABS: 1.0000 | ORAL_TABLET | Freq: Every day | ORAL | Status: DC

## 2020-07-22 MED ORDER — ENOXAPARIN SODIUM 40 MG/0.4ML IJ SOSY
40.0000 mg | PREFILLED_SYRINGE | INTRAMUSCULAR | Status: DC
  Administered 2020-07-22 – 2020-07-27 (×6): 40 mg via SUBCUTANEOUS
  Filled 2020-07-22 (×6): qty 0.4

## 2020-07-22 MED ORDER — PAROXETINE HCL 20 MG PO TABS
20.0000 mg | ORAL_TABLET | Freq: Every day | ORAL | Status: DC
  Administered 2020-07-22 – 2020-07-28 (×7): 20 mg via ORAL
  Filled 2020-07-22 (×7): qty 1

## 2020-07-22 MED ORDER — AMLODIPINE BESYLATE 10 MG PO TABS
10.0000 mg | ORAL_TABLET | Freq: Every day | ORAL | Status: DC
  Administered 2020-07-22 – 2020-07-28 (×7): 10 mg via ORAL
  Filled 2020-07-22 (×7): qty 1

## 2020-07-22 MED ORDER — ORAL CARE MOUTH RINSE
15.0000 mL | Freq: Two times a day (BID) | OROMUCOSAL | Status: DC
  Administered 2020-07-22 – 2020-07-27 (×9): 15 mL via OROMUCOSAL

## 2020-07-22 NOTE — ED Notes (Signed)
Called 2W for nurse assignment for patient. Charge is working on this at this time.

## 2020-07-22 NOTE — ED Notes (Signed)
Called respiratory to make aware of bipap order.

## 2020-07-22 NOTE — ED Notes (Signed)
CO2 89.4 via ABG, received from lab. ED provider notified and aware.

## 2020-07-22 NOTE — H&P (Signed)
History and Physical    [name] BTD:[id number] DOB: January 15, 1945 DOA: 07/22/2020  PCP: Lujean Amel, MD  Patient coming from: Home.  Chief Complaint: Shortness of breath.  HPI: [name] is a 75 y.o. female with history of COPD with ongoing tobacco abuse, diabetes mellitus type 2, diastolic dysfunction per 2D echo done in September 2021, history of paroxysmal atrial fibrillation not taking anticoagulation presented to the ER because of shortness of breath.  Patient states she became suddenly short of breath since yesterday morning with no associated chest pain or productive cough.  Shortness of breath increased on walking and lying down.  ED Course: In the ER patient was hypoxic and ABG showed hypercapnia with PCO2 of 89.4 with a pH of 7.2 patient was placed on BiPAP.  Chest x-ray shows consolidation versus pleural effusion on the right side.  Sodium 127 which appears to be chronic.  Patient was placed on antibiotics nebulizers steroids admitted for COPD exacerbation and on my exam patient has mild swelling of the legs and also has features concerning for CHF for which I ordered Lasix.  BNP is 237.  COVID test is negative.  Review of Systems: As per HPI, rest all negative.   Past Medical History:  Diagnosis Date   Acute exacerbation of CHF (congestive heart failure) ([place]) 09/22/2019   COPD (chronic obstructive pulmonary disease) (HCC)    Diabetes mellitus    Diastolic dysfunction    history   Hyperlipidemia    Hypertension     Past Surgical History:  Procedure Laterality Date   TUBAL LIGATION       reports that she has been smoking. She has a 52.00 pack-year smoking history. She has never used smokeless tobacco. No history on file for alcohol use and drug use.  No Known Allergies  Family History  Problem Relation Age of Onset   Heart disease Father    Lung cancer Father     Prior to Admission medications   Medication Sig Start Date End Date Taking?  Authorizing Provider  albuterol (VENTOLIN HFA) 108 (90 Base) MCG/ACT inhaler Inhale 2 puffs into the lungs every 4 (four) hours as needed for shortness of breath. 06/02/20  Yes [provider]  amLODipine-valsartan (EXFORGE) 10-320 MG tablet Take 1 tablet by mouth daily. 07/09/20  Yes [provider]  aspirin EC 81 MG tablet Take 81 mg by mouth daily. Swallow whole.   Yes [provider]  atorvastatin (LIPITOR) 20 MG tablet Take 20 mg by mouth every evening.   Yes [provider]  diazepam (VALIUM) 5 MG tablet Take 1 tablet (5 mg total) by mouth every 12 (twelve) hours as needed for anxiety. 02/01/18  Yes Ghimire, Henreitta Leber, MD  empagliflozin (JARDIANCE) 10 MG TABS tablet Take 10 mg by mouth daily.   Yes [provider]  feeding supplement, ENSURE ENLIVE, (ENSURE ENLIVE) LIQD Take 237 mLs by mouth 2 (two) times daily between meals. 02/02/18  Yes Ghimire, Henreitta Leber, MD  ferrous sulfate 325 (65 FE) MG tablet Take 1 tablet (325 mg total) by mouth 2 (two) times daily with a meal. Patient taking differently: Take 325 mg by mouth daily with breakfast. 02/01/18  Yes Ghimire, Henreitta Leber, MD  ipratropium-albuterol (DUONEB) 0.5-2.5 (3) MG/3ML SOLN Take 3 mLs by nebulization every 4 (four) hours as needed. Patient taking differently: Take 3 mLs by nebulization every 4 (four) hours as needed (shortness of breath). 02/01/18  Yes Ghimire, Henreitta Leber, MD  metFORMIN (GLUCOPHAGE)  500 MG tablet Take 500 mg by mouth 2 (two) times daily with a meal.   Yes [provider]  metoprolol succinate (TOPROL-XL) 25 MG 24 hr tablet Take 25 mg by mouth daily. 06/22/20  Yes [provider]  Multiple Vitamin (MULTIVITAMIN WITH MINERALS) TABS tablet Take 1 tablet by mouth daily.   Yes [provider]  omeprazole (PRILOSEC) 20 MG capsule Take 20 mg by mouth daily. 11/18/10  Yes [provider]  PARoxetine (PAXIL) 20 MG tablet Take 20 mg by mouth daily.    Yes  [provider]  TRELEGY ELLIPTA 200-62.5-25 MCG/INH AEPB Inhale 1 puff into the lungs daily. 06/22/20  Yes [provider]  Amino Acids-Protein Hydrolys (FEEDING SUPPLEMENT, PRO-STAT SUGAR FREE 64,) LIQD Take 30 mLs by mouth 2 (two) times daily. Patient not taking: No sig reported 02/01/18   Jonetta Osgood, MD  apixaban (ELIQUIS) 5 MG TABS tablet Take 1 tablet (5 mg total) by mouth 2 (two) times daily. Patient not taking: Reported on 07/22/2020 09/28/19   Antonieta Pert, MD  folic acid (FOLVITE) 1 MG tablet Take 1 tablet (1 mg total) by mouth daily. Patient not taking: Reported on 07/22/2020 09/29/19   Antonieta Pert, MD  furosemide (LASIX) 20 MG tablet Take 1 tablet (20 mg total) by mouth daily for 14 days. reassess in 2 wk for lasix 09/29/19 10/13/19  Antonieta Pert, MD  methocarbamol (ROBAXIN) 500 MG tablet Take 1 tablet (500 mg total) by mouth every 8 (eight) hours as needed for muscle spasms. Patient not taking: No sig reported 08/27/17   Purohit, Konrad Dolores, MD  metoprolol tartrate (LOPRESSOR) 25 MG tablet Take 1 tablet (25 mg total) by mouth 2 (two) times daily. Patient not taking: Reported on 07/22/2020 09/29/19   Antonieta Pert, MD  predniSONE (DELTASONE) 20 MG tablet Take PO 4 tabs daily x 3 days,3 tabs daily x 3 days,2 tabs daily x 3 days,1 tab daily x 2 days then stop. Patient not taking: Reported on 07/22/2020 09/28/19   Antonieta Pert, MD  silver sulfADIAZINE (SILVADENE) 1 % cream Apply 1 application topically daily. Patient not taking: No sig reported 01/16/18   Trula Slade, DPM  thiamine 100 MG tablet Take 1 tablet (100 mg total) by mouth daily. Patient not taking: Reported on 07/22/2020 09/29/19   Antonieta Pert, MD    Physical Exam: Constitutional: Moderately built and nourished. Vitals:   07/22/20 1130 07/22/20 1200 07/22/20 1347 07/22/20 1351  BP: (!) 158/68 (!) 158/72 (!) 183/49   Pulse: 87 95 86   Resp: 20 20 (!) 25   Temp:      SpO2: 96% 92% 94% 95%   Eyes: Anicteric no  pallor. ENMT: No discharge from the ears eyes nose mouth: Neck: No mass felt.  No JVD appreciated. Respiratory: Mild expiratory wheeze no crepitations. Cardiovascular: S1-S2 heard. Abdomen: Soft nontender bowel sound present. Musculoskeletal: Mild edema of the lower extremities. Skin: No rash. Neurologic: Alert awake oriented to time place and person.  Moves all extremities. Psychiatric: Appears normal.  Normal affect.   Labs on Admission: I have personally reviewed following labs and imaging studies  CBC: Recent Labs  Lab 07/22/20 0925  WBC 5.3  NEUTROABS 3.9  HGB 14.1  HCT 43.0  MCV 101.9*  PLT 829*   Basic Metabolic Panel: Recent Labs  Lab 07/22/20 0925  NA 127*  K 4.7  CL 76*  CO2 38*  GLUCOSE 84  BUN 17  CREATININE 0.58  CALCIUM 9.2  GFR: CrCl cannot be calculated (Unknown ideal weight.). Liver Function Tests: No results for input(s): AST, ALT, ALKPHOS, BILITOT, PROT, ALBUMIN in the last 168 hours. Recent Labs  Lab 07/22/20 0925  LIPASE 37   No results for input(s): AMMONIA in the last 168 hours. Coagulation Profile: No results for input(s): INR, PROTIME in the last 168 hours. Cardiac Enzymes: No results for input(s): CKTOTAL, CKMB, CKMBINDEX, TROPONINI in the last 168 hours. BNP (last 3 results) No results for input(s): PROBNP in the last 8760 hours. HbA1C: No results for input(s): HGBA1C in the last 72 hours. CBG: No results for input(s): GLUCAP in the last 168 hours. Lipid Profile: No results for input(s): CHOL, HDL, LDLCALC, TRIG, CHOLHDL, LDLDIRECT in the last 72 hours. Thyroid Function Tests: No results for input(s): TSH, T4TOTAL, FREET4, T3FREE, THYROIDAB in the last 72 hours. Anemia Panel: No results for input(s): VITAMINB12, FOLATE, FERRITIN, TIBC, IRON, RETICCTPCT in the last 72 hours. Urine analysis:    Component Value Date/Time   COLORURINE YELLOW 09/22/2019 2130   APPEARANCEUR CLEAR 09/22/2019 2130   LABSPEC 1.007 09/22/2019 2130    PHURINE 5.0 09/22/2019 2130   GLUCOSEU >=500 (A) 09/22/2019 2130   HGBUR NEGATIVE 09/22/2019 2130   [place] NEGATIVE 09/22/2019 2130   [place] NEGATIVE 09/22/2019 2130   PROTEINUR 30 (A) 09/22/2019 2130   NITRITE NEGATIVE 09/22/2019 2130   LEUKOCYTESUR NEGATIVE 09/22/2019 2130   Sepsis Labs: @LABRCNTIP (procalcitonin:4,lacticidven:4) ) Recent Results (from the past 240 hour(s))  Resp Panel by RT-PCR (Flu A&B, Covid) Nasopharyngeal Swab     Status: None   Collection Time: 07/22/20  9:25 AM   Specimen: Nasopharyngeal Swab; Nasopharyngeal(NP) swabs in vial transport medium  Result Value Ref Range Status   SARS Coronavirus 2 by RT PCR NEGATIVE NEGATIVE Final    Comment: (NOTE) SARS-CoV-2 target nucleic acids are NOT DETECTED.  The SARS-CoV-2 RNA is generally detectable in upper respiratory specimens during the acute phase of infection. The lowest concentration of SARS-CoV-2 viral copies this assay can detect is 138 copies/mL. A negative result does not preclude SARS-Cov-2 infection and should not be used as the sole basis for treatment or other patient management decisions. A negative result may occur with  improper specimen collection/handling, submission of specimen other than nasopharyngeal swab, presence of viral mutation(s) within the areas targeted by this assay, and inadequate number of viral copies(<138 copies/mL). A negative result must be combined with clinical observations, patient history, and epidemiological information. The expected result is Negative.  Fact Sheet for Patients:  EntrepreneurPulse.com.au  Fact Sheet for Healthcare Providers:  IncredibleEmployment.be  This test is no t yet approved or cleared by the [place] FDA and  has been authorized for detection and/or diagnosis of SARS-CoV-2 by FDA under an Emergency Use Authorization (EUA). This EUA will remain  in effect (meaning this test can be used) for the  duration of the COVID-19 declaration under Section 564(b)(1) of the Act, 21 U.S.C.section 360bbb-3(b)(1), unless the authorization is terminated  or revoked sooner.       Influenza A by PCR NEGATIVE NEGATIVE Final   Influenza B by PCR NEGATIVE NEGATIVE Final    Comment: (NOTE) The Xpert Xpress SARS-CoV-2/FLU/RSV plus assay is intended as an aid in the diagnosis of influenza from Nasopharyngeal swab specimens and should not be used as a sole basis for treatment. Nasal washings and aspirates are unacceptable for Xpert Xpress SARS-CoV-2/FLU/RSV testing.  Fact Sheet for Patients: EntrepreneurPulse.com.au  Fact Sheet for Healthcare Providers: IncredibleEmployment.be  This test is not  yet approved or cleared by the [place] and has been authorized for detection and/or diagnosis of SARS-CoV-2 by FDA under an Emergency Use Authorization (EUA). This EUA will remain in effect (meaning this test can be used) for the duration of the COVID-19 declaration under Section 564(b)(1) of the Act, 21 U.S.C. section 360bbb-3(b)(1), unless the authorization is terminated or revoked.  Performed at [facility], [place] [address] [place], [place] [address]      Radiological Exams on Admission: DG Chest Port 1 View  Result Date: 07/22/2020 CLINICAL DATA:  Shortness of breath EXAM: PORTABLE CHEST 1 VIEW COMPARISON:  09/29/2019 FINDINGS: Dense opacity at the right base which is considered a combination of pulmonary opacity and pleural fluid. Borderline heart size. Aortic tortuosity. No pneumothorax. Vascular congestion IMPRESSION: 1. Dense opacity at the right base, likely both moderate pleural effusion and pulmonary opacification. 2. Vascular congestion. Electronically Signed   By: Monte Fantasia M.D.   On: 07/22/2020 10:04    EKG: Independently reviewed.  Normal sinus rhythm with nonspecific changes.  Assessment/Plan Principal Problem:    Acute respiratory failure with hypoxia and hypercapnia (HCC) Active Problems:   COPD exacerbation (HCC)   Community acquired pneumonia of right lower lobe of lung   Diabetes mellitus, type II, insulin dependent (HCC)    Acute respiratory failure with hypoxia and hypercapnia likely combination of COPD and possible CHF with is also concern for pneumonia.  We will continue antibiotics for community-acquired pneumonia keep patient on nebulizer Pulmicort and also order Lasix.  Closely follow intake output Daily weights and metabolic panel.  Last 2D echo done in September 2021 showed EF of 60 to 65% with grade 1 diastolic dysfunction.  Repeat ABG.  Will order CT chest. Hypertension uncontrolled we will continue home medications including Toprol and Exforge.  Patient is also on Lasix. History of paroxysmal atrial fibrillation diagnosed during last admission in September 2021.  Patient has not been taking anticoagulation.  At this time continue aspirin.  We will place patient on antibiotics if patient changes mind. Tobacco abuse -we will request cessation counseling. Per report patient drinks alcohol every day we will keep patient on thiamine trying to avoid Ativan at this time since patient and hypercarbia.  We will closely monitor for any withdrawal. Thrombocytopenia appears to be new could be from alcoholism.  Follow CBC. Hyponatremia appears to be chronic.  Follow metabolic panel.  Since patient has respiratory failure with hypercapnia and hypoxia will need close monitoring for any further worsening and inpatient status.   DVT prophylaxis: Lovenox  Code Status: Full code. Family Communication: Patient requested me to talk to her brother. Disposition Plan: Home when stable. Consults called: None. Admission status: Inpatient.   Rise Patience MD [facility] Pager [phone].  If 7PM-7AM, please contact night-coverage [email or web address] Password [email or web address]  07/22/2020, 2:10 PM

## 2020-07-22 NOTE — ED Provider Notes (Signed)
[facility] DEPT Provider Note   CSN: [id number] Arrival date & time: 07/22/20  7711     History Chief Complaint  Patient presents with   Shortness of Breath    [name] is a 76 y.o. female.  Pt presents to the ED today with sob.  The pt has a hx of COPD and continues to smoke.  She does wear 4L oxygen at baseline.  The pt was given solumedrol 125 mg by EMS and 1 neb.  The pt's O2 sat was 77% on 4L.      Past Medical History:  Diagnosis Date   Acute exacerbation of CHF (congestive heart failure) ([place]) 09/22/2019   COPD (chronic obstructive pulmonary disease) ([place])    Diabetes mellitus    Diastolic dysfunction    history   Hyperlipidemia    Hypertension     Patient Active Problem List   Diagnosis Date Noted   Acute respiratory failure with hypoxia and hypercapnia ([place]) 07/22/2020   Acute on chronic respiratory failure with hypoxia and hypercapnia ([place]) 09/22/2019   Pressure injury of skin 09/22/2019   Labial infection 09/22/2019   Acute exacerbation of CHF (congestive heart failure) ([place]) 09/22/2019   Anemia 01/26/2018   PVD (peripheral vascular disease) ([place]) 01/26/2018   Ischemic ulcer diabetic foot ([place]) 01/26/2018   Malnutrition of moderate degree 01/26/2018   Critical limb ischemia with history of revascularization of same extremity ([place]) 12/21/2017   Hypomagnesemia 08/25/2017   Bilateral back pain 08/24/2017   Compression fracture of thoracic vertebra ([place]) 08/24/2017   Hyponatremia 08/24/2017   Hypokalemia 08/24/2017   Fall 08/24/2017   Microcytic anemia 08/24/2017   Alcohol use 08/24/2017   Depression 08/24/2017   Dyslipidemia associated with type 2 diabetes mellitus ([place]) 04/28/2016   Diabetes mellitus, type II, insulin dependent ([place]) 04/28/2016   GERD without esophagitis 04/28/2016   O2 dependent 04/28/2016   Community acquired pneumonia    Sepsis due to pneumonia ([place]) 04/18/2016   COPD exacerbation ([place])  12/04/2010   Smoker 12/04/2010   Essential hypertension 01/31/2008   Chronic respiratory failure ([place]) 01/31/2008    Past Surgical History:  Procedure Laterality Date   TUBAL LIGATION       OB History   No obstetric history on file.     Family History  Problem Relation Age of Onset   Heart disease Father    Lung cancer Father     Social History   Tobacco Use   Smoking status: Every Day    Packs/day: 1.00    Years: 52.00    Pack years: 52.00    Types: Cigarettes   Smokeless tobacco: Never    Home Medications Prior to Admission medications   Medication Sig Start Date End Date Taking? Authorizing Provider  albuterol (VENTOLIN HFA) 108 (90 Base) MCG/ACT inhaler Inhale 2 puffs into the lungs every 4 (four) hours as needed for shortness of breath. 06/02/20  Yes [provider]  amLODipine-valsartan (EXFORGE) 10-320 MG tablet Take 1 tablet by mouth daily. 07/09/20  Yes [provider]  aspirin EC 81 MG tablet Take 81 mg by mouth daily. Swallow whole.   Yes [provider]  atorvastatin (LIPITOR) 20 MG tablet Take 20 mg by mouth every evening.   Yes [provider]  diazepam (VALIUM) 5 MG tablet Take 1 tablet (5 mg total) by mouth every 12 (twelve) hours as needed for anxiety. 02/01/18  Yes Ghimire, Henreitta Leber, MD  empagliflozin (JARDIANCE) 10 MG TABS tablet  Take 10 mg by mouth daily.   Yes [provider]  feeding supplement, ENSURE ENLIVE, (ENSURE ENLIVE) LIQD Take 237 mLs by mouth 2 (two) times daily between meals. 02/02/18  Yes Ghimire, Henreitta Leber, MD  ferrous sulfate 325 (65 FE) MG tablet Take 1 tablet (325 mg total) by mouth 2 (two) times daily with a meal. Patient taking differently: Take 325 mg by mouth daily with breakfast. 02/01/18  Yes Ghimire, Henreitta Leber, MD  ipratropium-albuterol (DUONEB) 0.5-2.5 (3) MG/3ML SOLN Take 3 mLs by nebulization every 4 (four) hours as needed. Patient taking differently: Take 3 mLs by nebulization every  4 (four) hours as needed (shortness of breath). 02/01/18  Yes Ghimire, Henreitta Leber, MD  metFORMIN (GLUCOPHAGE) 500 MG tablet Take 500 mg by mouth 2 (two) times daily with a meal.   Yes [provider]  metoprolol succinate (TOPROL-XL) 25 MG 24 hr tablet Take 25 mg by mouth daily. 06/22/20  Yes [provider]  Multiple Vitamin (MULTIVITAMIN WITH MINERALS) TABS tablet Take 1 tablet by mouth daily.   Yes [provider]  omeprazole (PRILOSEC) 20 MG capsule Take 20 mg by mouth daily. 11/18/10  Yes [provider]  PARoxetine (PAXIL) 20 MG tablet Take 20 mg by mouth daily.    Yes [provider]  TRELEGY ELLIPTA 200-62.5-25 MCG/INH AEPB Inhale 1 puff into the lungs daily. 06/22/20  Yes [provider]  Amino Acids-Protein Hydrolys (FEEDING SUPPLEMENT, PRO-STAT SUGAR FREE 64,) LIQD Take 30 mLs by mouth 2 (two) times daily. Patient not taking: No sig reported 02/01/18   Jonetta Osgood, MD  apixaban (ELIQUIS) 5 MG TABS tablet Take 1 tablet (5 mg total) by mouth 2 (two) times daily. Patient not taking: Reported on 07/22/2020 09/28/19   Antonieta Pert, MD  folic acid (FOLVITE) 1 MG tablet Take 1 tablet (1 mg total) by mouth daily. Patient not taking: Reported on 07/22/2020 09/29/19   Antonieta Pert, MD  furosemide (LASIX) 20 MG tablet Take 1 tablet (20 mg total) by mouth daily for 14 days. reassess in 2 wk for lasix 09/29/19 10/13/19  Antonieta Pert, MD  methocarbamol (ROBAXIN) 500 MG tablet Take 1 tablet (500 mg total) by mouth every 8 (eight) hours as needed for muscle spasms. Patient not taking: No sig reported 08/27/17   Purohit, Konrad Dolores, MD  metoprolol tartrate (LOPRESSOR) 25 MG tablet Take 1 tablet (25 mg total) by mouth 2 (two) times daily. Patient not taking: Reported on 07/22/2020 09/29/19   Antonieta Pert, MD  predniSONE (DELTASONE) 20 MG tablet Take PO 4 tabs daily x 3 days,3 tabs daily x 3 days,2 tabs daily x 3 days,1 tab daily x 2 days then stop. Patient not taking:  Reported on 07/22/2020 09/28/19   Antonieta Pert, MD  silver sulfADIAZINE (SILVADENE) 1 % cream Apply 1 application topically daily. Patient not taking: No sig reported 01/16/18   Trula Slade, DPM  thiamine 100 MG tablet Take 1 tablet (100 mg total) by mouth daily. Patient not taking: Reported on 07/22/2020 09/29/19   Antonieta Pert, MD    Allergies    Patient has no known allergies.  Review of Systems   Review of Systems  Respiratory:  Positive for cough, shortness of breath and wheezing.   All other systems reviewed and are negative.  Physical Exam Updated Vital Signs BP (!) 158/72   Pulse 95   Temp 97.8 F (36.6 C)   Resp 20   SpO2 92%   Physical Exam  Vitals and nursing note reviewed.  Constitutional:      Appearance: She is well-developed.  HENT:     Head: Normocephalic and atraumatic.     Mouth/Throat:     Mouth: Mucous membranes are moist.  Eyes:     Pupils: Pupils are equal, round, and reactive to light.  Cardiovascular:     Rate and Rhythm: Normal rate and regular rhythm.  Pulmonary:     Effort: Tachypnea and respiratory distress present.     Breath sounds: Wheezing present.  Abdominal:     General: Bowel sounds are normal.     Palpations: Abdomen is soft.  Musculoskeletal:        General: Normal range of motion.     Cervical back: Normal range of motion and neck supple.  Skin:    General: Skin is warm.     Capillary Refill: Capillary refill takes less than 2 seconds.  Neurological:     General: No focal deficit present.     Mental Status: She is alert and oriented to person, place, and time.  Psychiatric:        Mood and Affect: Mood normal.        Behavior: Behavior normal.    ED Results / Procedures / Treatments   Labs (all labs ordered are listed, but only abnormal results are displayed) Labs Reviewed  BASIC METABOLIC PANEL - Abnormal; Notable for the following components:      Result Value   Sodium 127 (*)    Chloride 76 (*)    CO2 38 (*)    All  other components within normal limits  BRAIN NATRIURETIC PEPTIDE - Abnormal; Notable for the following components:   B Natriuretic Peptide 237.6 (*)    All other components within normal limits  CBC WITH DIFFERENTIAL/PLATELET - Abnormal; Notable for the following components:   MCV 101.9 (*)    Platelets 130 (*)    All other components within normal limits  BLOOD GAS, ARTERIAL - Abnormal; Notable for the following components:   pH, Arterial 7.269 (*)    pCO2 arterial 89.4 (*)    pO2, Arterial 68.2 (*)    Bicarbonate 39.6 (*)    Acid-Base Excess 9.0 (*)    All other components within normal limits  RESP PANEL BY RT-PCR (FLU A&B, COVID) ARPGX2  CULTURE, BLOOD (ROUTINE X 2)  CULTURE, BLOOD (ROUTINE X 2)  LACTIC ACID, PLASMA  LIPASE, BLOOD    EKG EKG Interpretation  Date/Time:  Tuesday July 22 2020 09:27:16 EDT Ventricular Rate:  76 PR Interval:  151 QRS Duration: 75 QT Interval:  395 QTC Calculation: 447 R Axis:   95 Text Interpretation: Sinus rhythm Probable anterior infarct, old No significant change since last tracing Confirmed by Isla Pence [phone]) on 07/22/2020 9:37:24 AM  Radiology DG Chest Port 1 View  Result Date: 07/22/2020 CLINICAL DATA:  Shortness of breath EXAM: PORTABLE CHEST 1 VIEW COMPARISON:  09/29/2019 FINDINGS: Dense opacity at the right base which is considered a combination of pulmonary opacity and pleural fluid. Borderline heart size. Aortic tortuosity. No pneumothorax. Vascular congestion IMPRESSION: 1. Dense opacity at the right base, likely both moderate pleural effusion and pulmonary opacification. 2. Vascular congestion. Electronically Signed   By: Monte Fantasia M.D.   On: 07/22/2020 10:04    Procedures Procedures   Medications Ordered in ED Medications  azithromycin (ZITHROMAX) 500 mg in sodium chloride 0.9 % 250 mL IVPB (500 mg Intravenous New Bag/Given 07/22/20 1156)  albuterol (VENTOLIN HFA) 108 (  90 Base) MCG/ACT inhaler 4 puff (4 puffs  Inhalation Given 07/22/20 0953)  aerochamber Z-Stat Plus/medium 1 each (1 each Other Given 07/22/20 0953)  cefTRIAXone (ROCEPHIN) 1 g in sodium chloride 0.9 % 100 mL IVPB (0 g Intravenous Stopped 07/22/20 1139)  LORazepam (ATIVAN) injection 0.5 mg (0.5 mg Intravenous Given 07/22/20 1108)    ED Course  I have reviewed the triage vital signs and the nursing notes.  Pertinent labs & imaging results that were available during my care of the patient were reviewed by me and considered in my medical decision making (see chart for details).    MDM Rules/Calculators/A&P                         Pt has a pleural/effusion + consolidation.  Pt started on zithromax/rocephin.  Pt given solumedrol by EMS and inhalers here.  She is also started on bipap due to the hypercarbia.  She is doing well on bipap.  Covid is negative.  Pt is d/w Dr. Hal Hope ([facility]ad) who will admit.  CRITICAL CARE Performed by: Isla Pence   Total critical care time: 30 minutes  Critical care time was exclusive of separately billable procedures and treating other patients.  Critical care was necessary to treat or prevent imminent or life-threatening deterioration.  Critical care was time spent personally by me on the following activities: development of treatment plan with patient and/or surrogate as well as nursing, discussions with consultants, evaluation of patient's response to treatment, examination of patient, obtaining history from patient or surrogate, ordering and performing treatments and interventions, ordering and review of laboratory studies, ordering and review of radiographic studies, pulse oximetry and re-evaluation of patient's condition.    [name] was evaluated in Emergency Department on 07/22/2020 for the symptoms described in the history of present illness. She was evaluated in the context of the global COVID-19 pandemic, which necessitated consideration that the patient might be at risk for  infection with the SARS-CoV-2 virus that causes COVID-19. Institutional protocols and algorithms that pertain to the evaluation of patients at risk for COVID-19 are in a state of rapid change based on information released by regulatory bodies including the CDC and federal and state organizations. These policies and algorithms were followed during the patient's care in the ED.  Final Clinical Impression(s) / ED Diagnoses Final diagnoses:  COPD exacerbation ([place])  Acute respiratory failure with hypoxia and hypercapnia ([place])  Community acquired pneumonia of right lower lobe of lung  Pleural effusion on right  Tobacco abuse  Hyponatremia    Rx / DC Orders ED Discharge Orders     None        Isla Pence, MD 07/22/20 1228

## 2020-07-22 NOTE — ED Triage Notes (Signed)
Per EMS, patient from home, SOB since 0400. 77% on 4L [place] at home. Hx COPD. Breathing treatment with EMS. [place] noted after treatment. Ambulatory with walker at baseline.  18g L AC Solumedrol with EMS

## 2020-07-23 LAB — BLOOD CULTURE ID PANEL (REFLEXED) - BCID2

## 2020-07-23 LAB — CBC
HCT: 37.8 % (ref 36.0–46.0)
Hemoglobin: 12.5 g/dL (ref 12.0–15.0)
MCH: 33.4 pg (ref 26.0–34.0)
MCHC: 33.1 g/dL (ref 30.0–36.0)
MCV: 101.1 fL — ABNORMAL HIGH (ref 80.0–100.0)
Platelets: 129 10*3/uL — ABNORMAL LOW (ref 150–400)
RBC: 3.74 MIL/uL — ABNORMAL LOW (ref 3.87–5.11)
RDW: 12.9 % (ref 11.5–15.5)
WBC: 3.3 10*3/uL — ABNORMAL LOW (ref 4.0–10.5)
nRBC: 0 % (ref 0.0–0.2)

## 2020-07-23 LAB — BLOOD GAS, ARTERIAL
Acid-Base Excess: 13.2 mmol/L — ABNORMAL HIGH (ref 0.0–2.0)
Bicarbonate: 41.4 mmol/L — ABNORMAL HIGH (ref 20.0–28.0)
O2 Saturation: 97.1 %
Patient temperature: 98.2
pCO2 arterial: 72.9 mmHg (ref 32.0–48.0)
pH, Arterial: 7.371 (ref 7.350–7.450)
pO2, Arterial: 95.1 mmHg (ref 83.0–108.0)

## 2020-07-23 LAB — BASIC METABOLIC PANEL
Anion gap: 11 (ref 5–15)
BUN: 23 mg/dL (ref 8–23)
CO2: 36 mmol/L — ABNORMAL HIGH (ref 22–32)
Calcium: 9 mg/dL (ref 8.9–10.3)
Chloride: 76 mmol/L — ABNORMAL LOW (ref 98–111)
Creatinine, Ser: 0.62 mg/dL (ref 0.44–1.00)
GFR, Estimated: >60 mL/min (ref >60)
Glucose, Bld: 156 mg/dL — ABNORMAL HIGH (ref 70–99)
Potassium: 4.8 mmol/L (ref 3.5–5.1)
Sodium: 123 mmol/L — ABNORMAL LOW (ref 135–145)

## 2020-07-23 LAB — GLUCOSE, CAPILLARY
Glucose-Capillary: 212 mg/dL — ABNORMAL HIGH (ref 70–99)
Glucose-Capillary: 130 mg/dL — ABNORMAL HIGH (ref 70–99)
Glucose-Capillary: 142 mg/dL — ABNORMAL HIGH (ref 70–99)
Glucose-Capillary: 174 mg/dL — ABNORMAL HIGH (ref 70–99)
Glucose-Capillary: 164 mg/dL — ABNORMAL HIGH (ref 70–99)
Glucose-Capillary: 165 mg/dL — ABNORMAL HIGH (ref 70–99)

## 2020-07-23 LAB — MRSA NEXT GEN BY PCR, NASAL: MRSA by PCR Next Gen: DETECTED — AB

## 2020-07-23 MED ORDER — ACETAMINOPHEN 500 MG PO TABS: 500.0000 mg | ORAL_TABLET | Freq: Four times a day (QID) | ORAL | Status: DC | PRN

## 2020-07-23 MED ORDER — PROSOURCE PLUS PO LIQD
30.0000 mL | Freq: Two times a day (BID) | ORAL | Status: DC
  Administered 2020-07-23 – 2020-07-28 (×10): 30 mL via ORAL
  Filled 2020-07-23 (×10): qty 30

## 2020-07-23 MED ORDER — METOPROLOL TARTRATE 50 MG PO TABS
50.0000 mg | ORAL_TABLET | Freq: Two times a day (BID) | ORAL | Status: DC
  Administered 2020-07-23 – 2020-07-28 (×8): 50 mg via ORAL
  Filled 2020-07-23 (×3): qty 2
  Filled 2020-07-23: qty 1
  Filled 2020-07-23 (×2): qty 2
  Filled 2020-07-23: qty 1
  Filled 2020-07-23: qty 2
  Filled 2020-07-23: qty 1
  Filled 2020-07-23: qty 2
  Filled 2020-07-23: qty 1

## 2020-07-23 MED ORDER — ENSURE ENLIVE PO LIQD
237.0000 mL | ORAL | Status: DC
  Administered 2020-07-23 – 2020-07-27 (×5): 237 mL via ORAL

## 2020-07-23 MED ORDER — CLONIDINE HCL 0.1 MG PO TABS
0.1000 mg | ORAL_TABLET | Freq: Two times a day (BID) | ORAL | Status: DC
  Administered 2020-07-23 – 2020-07-24 (×2): 0.1 mg via ORAL
  Filled 2020-07-23 (×2): qty 1

## 2020-07-23 MED ORDER — DIAZEPAM 5 MG PO TABS
5.0000 mg | ORAL_TABLET | Freq: Two times a day (BID) | ORAL | Status: DC | PRN
  Administered 2020-07-23 – 2020-07-27 (×4): 5 mg via ORAL
  Filled 2020-07-23 (×4): qty 1

## 2020-07-23 MED ORDER — ADULT MULTIVITAMIN W/MINERALS CH
1.0000 | ORAL_TABLET | Freq: Every day | ORAL | Status: DC
  Administered 2020-07-23 – 2020-07-28 (×6): 1 via ORAL
  Filled 2020-07-23 (×6): qty 1

## 2020-07-23 NOTE — Progress Notes (Signed)
PT PCO2 was 72.9 upon recheck. On Call provider notified. He instructed me to ask the patient to go back on her BiPAP. Pt is refusing BiPAP at this time. Provider aware. Pt stated she will go back on the bipap later, but does not wish to do so now.  Pt educated.  Will continue to monitor

## 2020-07-23 NOTE — Progress Notes (Signed)
PHARMACY - PHYSICIAN COMMUNICATION CRITICAL VALUE ALERT - BLOOD CULTURE IDENTIFICATION (BCID)  [name] is an 75 y.o. female who presented to [facility] on 07/22/2020 with a chief complaint of SOB  Assessment: Acute respiratory failure with hypoxia and hypercapnia, CAP, COPD, CHF.   7/19 BCx: 1/4 bottles GPC, methicillin resistant Staph Epi WBC low, Afebrile  Name of physician (or Provider) Contacted: Dr Thereasa Solo  Current antibiotics: Ceftriaxone, Azithromycin  Changes to prescribed antibiotics recommended:  Possible contaminant.  Monitor clinically and NO changes to current antibiotics.  Results for orders placed or performed during the hospital encounter of 07/22/20  Blood Culture ID Panel (Reflexed) (Collected: 07/22/2020  9:50 AM)  Result Value Ref Range   Enterococcus faecalis NOT DETECTED NOT DETECTED   Enterococcus Faecium NOT DETECTED NOT DETECTED   Listeria monocytogenes NOT DETECTED NOT DETECTED   Staphylococcus species DETECTED (A) NOT DETECTED   Staphylococcus aureus (BCID) NOT DETECTED NOT DETECTED   Staphylococcus epidermidis DETECTED (A) NOT DETECTED   Staphylococcus lugdunensis NOT DETECTED NOT DETECTED   Streptococcus species NOT DETECTED NOT DETECTED   Streptococcus agalactiae NOT DETECTED NOT DETECTED   Streptococcus pneumoniae NOT DETECTED NOT DETECTED   Streptococcus pyogenes NOT DETECTED NOT DETECTED   A.calcoaceticus-baumannii NOT DETECTED NOT DETECTED   Bacteroides fragilis NOT DETECTED NOT DETECTED   Enterobacterales NOT DETECTED NOT DETECTED   Enterobacter cloacae complex NOT DETECTED NOT DETECTED   Escherichia coli NOT DETECTED NOT DETECTED   Klebsiella aerogenes NOT DETECTED NOT DETECTED   Klebsiella oxytoca NOT DETECTED NOT DETECTED   Klebsiella pneumoniae NOT DETECTED NOT DETECTED   Proteus species NOT DETECTED NOT DETECTED   Salmonella species NOT DETECTED NOT DETECTED   Serratia marcescens NOT DETECTED NOT DETECTED   Haemophilus  influenzae NOT DETECTED NOT DETECTED   Neisseria meningitidis NOT DETECTED NOT DETECTED   Pseudomonas aeruginosa NOT DETECTED NOT DETECTED   Stenotrophomonas maltophilia NOT DETECTED NOT DETECTED   Candida albicans NOT DETECTED NOT DETECTED   Candida auris NOT DETECTED NOT DETECTED   Candida glabrata NOT DETECTED NOT DETECTED   Candida krusei NOT DETECTED NOT DETECTED   Candida parapsilosis NOT DETECTED NOT DETECTED   Candida tropicalis NOT DETECTED NOT DETECTED   Cryptococcus neoformans/gattii NOT DETECTED NOT DETECTED   Methicillin resistance mecA/C DETECTED (A) NOT DETECTED    Gretta Arab PharmD, BCPS Clinical Pharmacist [facility] main pharmacy [phone] 07/23/2020 7:42 AM

## 2020-07-23 NOTE — Progress Notes (Signed)
Initial Nutrition Assessment  DOCUMENTATION CODES:   Not applicable  INTERVENTION:  - will order Ensure Enlive once/day, each supplement provides 350 kcal and 20 grams of protein. - will order 30 ml Prosource Plus BID, each supplement provides 100 kcal and 15 grams protein.  - will order 1 tablet multivitamin with minerals/day.   NUTRITION DIAGNOSIS:   Increased nutrient needs related to acute illness as evidenced by estimated needs.  GOAL:   Patient will meet greater than or equal to 90% of their needs  MONITOR:   PO intake, Supplement acceptance, Labs, Weight trends  REASON FOR ASSESSMENT:   Malnutrition Screening Tool  ASSESSMENT:   75 y.o. female with medical history of COPD, ongoing tobacco abuse, type 2 DM, diastolic dysfunction, and afib not on anticoagulation. He presented to the ED because of sudden shortness of breath which worsened with walking and laying down.  She ate 50% of breakfast which consisted of grits, pancakes, and sausage. Lunch meal (cheese burger and milk) came at the end of visit and RD helped patient with setting up meal.   She denies any chewing or swallowing difficulties now or PTA. She denies any increase in SOB when eating breakfast. She did experience some SOB with eating PTA but SOB more so caused by movement, specifically ambulating from one room to another.   She lives alone and does not cook. Breakfast is typically a bowl of cereal, lunch is a sandwich or similar, and dinner is a frozen meal. Her eating pattern and volume consumed/meal has not changed recently.   She does not weigh herself at home. Her last doctor's appt was ~6 months ago and at that time she weighed her UBW of 165 lb.   Weight yesterday was documented to be 184 lb. This indicates 20 lb weight gain in the past 6 months. MST report indicated that patient stated she has lost 40 lb recently. This is not substantiated.    Labs reviewed; Na: 123 mmol/l, Cl: 76  mmol/l. Medications reviewed; 40 mg IV lasix x1 dose 7/19, sliding scale novolog, 40 mg solu-medrol BID, 40 mg oral protonix/day.      NUTRITION - FOCUSED PHYSICAL EXAM:  Flowsheet Row Most Recent Value  Orbital Region No depletion  Upper Arm Region No depletion  Thoracic and Lumbar Region Unable to assess  Buccal Region No depletion  Temple Region No depletion  Clavicle Bone Region Mild depletion  Clavicle and Acromion Bone Region No depletion  Scapular Bone Region Unable to assess  Dorsal Hand No depletion  Patellar Region Mild depletion  Anterior Thigh Region Unable to assess  Posterior Calf Region Moderate depletion  Edema (RD Assessment) None  Hair Reviewed  Eyes Reviewed  Mouth Reviewed  Skin Reviewed  Nails Reviewed       Diet Order:   Diet Order             Diet heart healthy/carb modified Room service appropriate? Yes; Fluid consistency: Thin; Fluid restriction: 1200 mL Fluid  Diet effective now                   EDUCATION NEEDS:   No education needs have been identified at this time  Skin:  Skin Assessment: Reviewed RN Assessment  Last BM:  7/18 (per pt, PTA)  Height:   Ht Readings from Last 1 Encounters:  07/22/20 5\' 8"  (1.727 m)    Weight:   Wt Readings from Last 1 Encounters:  07/22/20 83.6 kg     Estimated  Nutritional Needs:  Kcal:  1800-2000 kcal Protein:  90-105 grams Fluid:  >/= 1.8 L/day      Jarome Matin, MS, RD, LDN, CNSC Inpatient Clinical Dietitian RD pager # available in [facility]  After hours/weekend pager # available in [facility]

## 2020-07-23 NOTE — TOC Initial Note (Signed)
Transition of Care [facility]) - Initial/Assessment Note    Patient Details  Name: [name] MRN: [id number] Date of Birth: [birth year]  Transition of Care [facility]) CM/SW Contact:    Leeroy Cha, RN Phone Number: 07/23/2020, 8:08 AM  Clinical Narrative:                 75 y.o. female with history of COPD with ongoing tobacco abuse, diabetes mellitus type 2, diastolic dysfunction per 2D echo done in September 2021, history of paroxysmal atrial fibrillation not taking anticoagulation presented to the ER because of shortness of breath.  Patient states she became suddenly short of breath since yesterday morning with no associated chest pain or productive cough.  Shortness of breath increased on walking and lying down.   ED Course: In the ER patient was hypoxic and ABG showed hypercapnia with PCO2 of 89.4 with a pH of 7.2 patient was placed on BiPAP.  Chest x-ray shows consolidation versus pleural effusion on the right side.  Sodium 127 which appears to be chronic.  Patient was placed on antibiotics nebulizers steroids admitted for COPD exacerbation and on my exam patient has mild swelling of the legs and also has features concerning for CHF for which I ordered Lasix.  BNP is 237.  COVID test is negative.  toc plan: To return to home may need hhc due to copd condition will follow for this.  Expected Discharge Plan: [place] Barriers to Discharge: Continued Medical Work up   Patient Goals and CMS Choice Patient states their goals for this hospitalization and ongoing recovery are:: to go home CMS Medicare.gov Compare Post Acute Care list provided to:: Patient    Expected Discharge Plan and Services Expected Discharge Plan: [place]   Discharge Planning Services: CM Consult   Living arrangements for the past 2 months: Single Family Home                                      Prior Living Arrangements/Services Living arrangements for the  past 2 months: Single Family Home Lives with:: Self Patient language and need for interpreter reviewed:: Yes Do you feel safe going back to the place where you live?: Yes            Criminal Activity/Legal Involvement Pertinent to Current Situation/Hospitalization: No - Comment as needed  Activities of Daily Living Home Assistive Devices/Equipment: Oxygen, Nebulizer, Walker (specify type) ADL Screening (condition at time of admission) Patient's cognitive ability adequate to safely complete daily activities?: Yes Is the patient deaf or have difficulty hearing?: No Does the patient have difficulty seeing, even when wearing glasses/contacts?: No Does the patient have difficulty concentrating, remembering, or making decisions?: No Patient able to express need for assistance with ADLs?: Yes Does the patient have difficulty dressing or bathing?: No Independently performs ADLs?: Yes (appropriate for developmental age) Does the patient have difficulty walking or climbing stairs?: Yes (sob) Weakness of Legs: Both Weakness of Arms/Hands: Both  Permission Sought/Granted                  Emotional Assessment Appearance:: Appears stated age     Orientation: : Oriented to Self, Oriented to Place, Oriented to  Time, Oriented to Situation Alcohol / Substance Use: Not Applicable Psych Involvement: No (comment)  Admission diagnosis:  Hyponatremia [E87.1] Tobacco abuse [Z72.0] COPD exacerbation (HCC) [J44.1] Pleural effusion on  right [J90] Acute respiratory failure with hypoxia and hypercapnia (HCC) [J96.01, J96.02] Community acquired pneumonia of right lower lobe of lung [J18.9] Patient Active Problem List   Diagnosis Date Noted   Acute respiratory failure with hypoxia and hypercapnia ([place]) 07/22/2020   Acute on chronic respiratory failure with hypoxia and hypercapnia (HCC) 09/22/2019   Pressure injury of skin 09/22/2019   Labial infection 09/22/2019   Acute exacerbation of CHF  (congestive heart failure) ([place]) 09/22/2019   Anemia 01/26/2018   PVD (peripheral vascular disease) ([place]) 01/26/2018   Ischemic ulcer diabetic foot ([place]) 01/26/2018   Malnutrition of moderate degree 01/26/2018   Critical limb ischemia with history of revascularization of same extremity ([place]) 12/21/2017   Hypomagnesemia 08/25/2017   Bilateral back pain 08/24/2017   Compression fracture of thoracic vertebra ([place]) 08/24/2017   Hyponatremia 08/24/2017   Hypokalemia 08/24/2017   Fall 08/24/2017   Microcytic anemia 08/24/2017   Alcohol use 08/24/2017   Depression 08/24/2017   Dyslipidemia associated with type 2 diabetes mellitus ([place]) 04/28/2016   Diabetes mellitus, type II, insulin dependent ([place]) 04/28/2016   GERD without esophagitis 04/28/2016   O2 dependent 04/28/2016   Community acquired pneumonia of right lower lobe of lung    Sepsis due to pneumonia ([place]) 04/18/2016   COPD exacerbation ([place]) 12/04/2010   Smoker 12/04/2010   Essential hypertension 01/31/2008   Chronic respiratory failure ([place]) 01/31/2008   PCP:  Lujean Amel, MD Pharmacy:   [facility] [address] [place] - [address] [place] [place] [address] Phone: [phone] Fax: [place] [address] [place] [address] Phone: [phone] Fax: [phone]     Social Determinants of Health (SDOH) Interventions    Readmission Risk Interventions No flowsheet data found.

## 2020-07-23 NOTE — Progress Notes (Addendum)
[name]  DQQ:[id number] DOB: 04-02-1945 DOA: 07/22/2020 PCP: Lujean Amel, MD    Brief Narrative:  75 year old with a history of COPD, DM2, ongoing tobacco abuse, chronic diastolic CHF, and PAF not on anticoagulation who presented to the ER with shortness of breath which and rapidly progressed over a 24-hour period.  In the ER she was found to be hypoxic and hypercapnic.  CXR noted a right-sided infiltrate.  COVID test was negative.  Significant Events:  7/19 admit via ER  Consultants:  None  Code Status: FULL CODE  Antimicrobials:  Azithromycin 7/19 > Ceftriaxone 7/19 >  DVT prophylaxis: Lovenox  Subjective: The patient is off BiPAP and resting comfortably on high flow nasal cannula support at the time of my visit.  She is alert and oriented and conversant.  She tells me her breathing feels much better though she does continue to wheeze, and has not yet back to her baseline.  She denies chest pain nausea or vomiting or abdominal pain.  Assessment & Plan:  Acute COPD exacerbation -R LL pneumonia w/ effusion - acute hypoxic and hypercapnic respiratory failure Required BiPAP for hypercapnia initially -improving the point of being liberated from BiPAP -continues to wheeze extensively -continue current therapies -monitor in stepdown bed as she has a high risk of requiring BiPAP again  Chronic diastolic CHF No gross volume overload on exam -continue usual maintenance therapies and follow I's and O's and weights  Filed Weights   07/22/20 1510  Weight: 83.6 kg    Uncontrolled HTN Blood pressure remains poorly controlled -adjust medical therapy and follow  Staph epi in blood culure Noted in 1 of 2 cultures collected - suspect this is a contaminant - follow clinically   DM2 - uncontrolled w/ hyperglycemia  CBG now trending down -monitor trend without change in treatment for now  PAF Not on chronic anticoagulation -rate controlled  Ongoing tobacco abuse Continue  to counsel on absolute need to discontinue tobacco abuse  Excessive alcohol intake Patient admits to drinking alcohol every day -counseled patient on advisability of abstaining - at risk for withdrawal   Thrombocytopenia Likely due to alcohol abuse  Macrocytosis  Check B12 and folate- likely due to EtOH abuse/toxic effects on bone marrow  Chronic hyponatremia Likely related to alcohol abuse +/- CHF +/- chronic pulm disease - appears stable for now - follow    Family Communication: No family present at time of exam Status is: Inpatient  Remains inpatient appropriate because:Inpatient level of care appropriate due to severity of illness  Dispo: The patient is from: Home              Anticipated d/c is to:  unclear              Patient currently is not medically stable to d/c.   Difficult to place patient No   Objective: Blood pressure (!) 183/58, pulse 82, temperature 98.8 F (37.1 C), temperature source Oral, resp. rate (!) 27, height 5\' 8"  (1.727 m), weight 83.6 kg, SpO2 90 %.  Intake/Output Summary (Last 24 hours) at 07/23/2020 1941 Last data filed at 07/23/2020 0300 Gross per 24 hour  Intake 350 ml  Output 1950 ml  Net -1600 ml   Filed Weights   07/22/20 1510  Weight: 83.6 kg    Examination: General: No acute respiratory distress on high flow oxygen Lungs: Diffuse prominent wheezing with mildly prolonged expiratory phase but with good air movement otherwise -mild bibasilar crackles Cardiovascular: Heart sounds distant, regular, no  gallop or rub Abdomen: Nontender, nondistended, soft, bowel sounds positive, no rebound, no ascites, no appreciable mass Extremities: Trace bilateral lower extremity edema  CBC: Recent Labs  Lab 07/22/20 0925 07/22/20 1509 07/23/20 0245  WBC 5.3 4.8 3.3*  NEUTROABS 3.9  --   --   HGB 14.1 13.6 12.5  HCT 43.0 41.4 37.8  MCV 101.9* 101.7* 101.1*  PLT 130* 129* 440*   Basic Metabolic Panel: Recent Labs  Lab 07/22/20 1509  07/22/20 1953 07/23/20 0245  NA 127* 125* 123*  K 4.9 4.8 4.8  CL 75* 77* 76*  CO2 37* 36* 36*  GLUCOSE 141* 153* 156*  BUN 18 23 23   CREATININE 0.73 0.68 0.62  CALCIUM 9.3 9.1 9.0   GFR: Estimated Creatinine Clearance: 69.9 mL/min (by C-G formula based on SCr of 0.62 mg/dL).  Liver Function Tests: No results for input(s): AST, ALT, ALKPHOS, BILITOT, PROT, ALBUMIN in the last 168 hours. Recent Labs  Lab 07/22/20 0925  LIPASE 37    HbA1C: Hgb A1c MFr Bld  Date/Time Value Ref Range Status  09/23/2019 01:38 AM 6.4 (H) 4.8 - 5.6 % Final    Comment:    (NOTE) Pre diabetes:          5.7%-6.4%  Diabetes:              >6.4%  Glycemic control for   <7.0% adults with diabetes   04/21/2016 02:53 AM 6.0 (H) 4.8 - 5.6 % Final    Comment:    (NOTE)         Pre-diabetes: 5.7 - 6.4         Diabetes: >6.4         Glycemic control for adults with diabetes: <7.0     CBG: Recent Labs  Lab 07/22/20 1513 07/22/20 1639 07/22/20 2113  GLUCAP 161* 268* 212*    Recent Results (from the past 240 hour(s))  Resp Panel by RT-PCR (Flu A&B, Covid) Nasopharyngeal Swab     Status: None   Collection Time: 07/22/20  9:25 AM   Specimen: Nasopharyngeal Swab; Nasopharyngeal(NP) swabs in vial transport medium  Result Value Ref Range Status   SARS Coronavirus 2 by RT PCR NEGATIVE NEGATIVE Final    Comment: (NOTE) SARS-CoV-2 target nucleic acids are NOT DETECTED.  The SARS-CoV-2 RNA is generally detectable in upper respiratory specimens during the acute phase of infection. The lowest concentration of SARS-CoV-2 viral copies this assay can detect is 138 copies/mL. A negative result does not preclude SARS-Cov-2 infection and should not be used as the sole basis for treatment or other patient management decisions. A negative result may occur with  improper specimen collection/handling, submission of specimen other than nasopharyngeal swab, presence of viral mutation(s) within the areas  targeted by this assay, and inadequate number of viral copies(<138 copies/mL). A negative result must be combined with clinical observations, patient history, and epidemiological information. The expected result is Negative.  Fact Sheet for Patients:  EntrepreneurPulse.com.au  Fact Sheet for Healthcare Providers:  IncredibleEmployment.be  This test is no t yet approved or cleared by the [place] FDA and  has been authorized for detection and/or diagnosis of SARS-CoV-2 by FDA under an Emergency Use Authorization (EUA). This EUA will remain  in effect (meaning this test can be used) for the duration of the COVID-19 declaration under Section 564(b)(1) of the Act, 21 U.S.C.section 360bbb-3(b)(1), unless the authorization is terminated  or revoked sooner.       Influenza A by PCR NEGATIVE  NEGATIVE Final   Influenza B by PCR NEGATIVE NEGATIVE Final    Comment: (NOTE) The Xpert Xpress SARS-CoV-2/FLU/RSV plus assay is intended as an aid in the diagnosis of influenza from Nasopharyngeal swab specimens and should not be used as a sole basis for treatment. Nasal washings and aspirates are unacceptable for Xpert Xpress SARS-CoV-2/FLU/RSV testing.  Fact Sheet for Patients: EntrepreneurPulse.com.au  Fact Sheet for Healthcare Providers: IncredibleEmployment.be  This test is not yet approved or cleared by the [place] FDA and has been authorized for detection and/or diagnosis of SARS-CoV-2 by FDA under an Emergency Use Authorization (EUA). This EUA will remain in effect (meaning this test can be used) for the duration of the COVID-19 declaration under Section 564(b)(1) of the Act, 21 U.S.C. section 360bbb-3(b)(1), unless the authorization is terminated or revoked.  Performed at [facility], [place] [address], [place], [place] [address]   Culture, blood (routine x 2)     Status: None  (Preliminary result)   Collection Time: 07/22/20  9:50 AM   Specimen: BLOOD  Result Value Ref Range Status   Specimen Description   Final    BLOOD LEFT ANTECUBITAL Performed at [facility] [address] [place], [place] [address]    Special Requests   Final    BOTTLES DRAWN AEROBIC AND ANAEROBIC Blood Culture results may not be optimal due to an inadequate volume of blood received in culture bottles Performed at [facility] [address], [place], [place] [address]    Culture  Setup Time   Final    GRAM POSITIVE COCCI AEROBIC BOTTLE ONLY CRITICAL RESULT CALLED TO, READ BACK BY AND VERIFIED WITH: PHARMD T GREEN [id number] AT 721 AM BY CM Performed at [facility], [place] [address], [place], [place] [address]    Culture GRAM POSITIVE COCCI  Final   Report Status PENDING  Incomplete  Culture, blood (routine x 2)     Status: None (Preliminary result)   Collection Time: 07/22/20 11:05 AM   Specimen: BLOOD  Result Value Ref Range Status   Specimen Description   Final    BLOOD RIGHT ANTECUBITAL Performed at [facility] [address] [place], [place] [address]    Special Requests   Final    BOTTLES DRAWN AEROBIC AND ANAEROBIC Blood Culture adequate volume Performed at [facility] [address], [place], [place] [address]    Culture   Final    NO GROWTH < 24 HOURS Performed at [facility] [address] [place], [place] [address]    Report Status PENDING  Incomplete  MRSA Next Gen by PCR, Nasal     Status: Abnormal   Collection Time: 07/22/20  9:00 PM   Specimen: Nasal Mucosa; Nasal Swab  Result Value Ref Range Status   MRSA by PCR Next Gen DETECTED (A) NOT DETECTED Final    Comment: RESULT CALLED TO, READ BACK BY AND VERIFIED WITH: DANIELLE, RN @ 0025 ON 07/23/20 C VARNER (NOTE) The GeneXpert MRSA Assay (FDA approved for NASAL specimens only), is one component of a comprehensive  MRSA colonization surveillance program. It is not intended to diagnose MRSA infection nor to guide or monitor treatment for MRSA infections. Test performance is not FDA approved in patients less than 39 years old. Performed at [facility], [place] [address], [place], [place] [address]      Scheduled Meds:  amLODipine  10 mg Oral Daily  And   irbesartan  300 mg Oral Daily   aspirin EC  81 mg Oral Daily   atorvastatin  20 mg Oral QPM   budesonide (PULMICORT) nebulizer solution  0.25 mg Nebulization BID   chlorhexidine  15 mL Mouth Rinse BID   Chlorhexidine Gluconate Cloth  6 each Topical Daily   enoxaparin (LOVENOX) injection  40 mg Subcutaneous Q24H   insulin aspart  0-9 Units Subcutaneous TID WC   ipratropium-albuterol  3 mL Nebulization Q6H   mouth rinse  15 mL Mouth Rinse q12n4p   methylPREDNISolone (SOLU-MEDROL) injection  40 mg Intravenous Q12H   metoprolol succinate  25 mg Oral Daily   pantoprazole  40 mg Oral Daily   PARoxetine  20 mg Oral Daily   Continuous Infusions:  azithromycin 500 mg (07/22/20 1156)   cefTRIAXone (ROCEPHIN)  IV       LOS: 1 day   Cherene Altes, MD [facility] Office  [phone] Pager - Text Page per Amion  If 7PM-7AM, please contact night-coverage per Amion 07/23/2020, 7:22 AM

## 2020-07-24 DIAGNOSIS — E871 Hypo-osmolality and hyponatremia: Secondary | ICD-10-CM

## 2020-07-24 DIAGNOSIS — J189 Pneumonia, unspecified organism: Principal | ICD-10-CM

## 2020-07-24 LAB — GLUCOSE, CAPILLARY
Glucose-Capillary: 151 mg/dL — ABNORMAL HIGH (ref 70–99)
Glucose-Capillary: 127 mg/dL — ABNORMAL HIGH (ref 70–99)
Glucose-Capillary: 257 mg/dL — ABNORMAL HIGH (ref 70–99)
Glucose-Capillary: 119 mg/dL — ABNORMAL HIGH (ref 70–99)

## 2020-07-24 LAB — RETICULOCYTES
Immature Retic Fract: 13 % (ref 2.3–15.9)
RBC.: 3.59 MIL/uL — ABNORMAL LOW (ref 3.87–5.11)
Retic Count, Absolute: 110.6 10*3/uL (ref 19.0–186.0)
Retic Ct Pct: 3.1 % (ref 0.4–3.1)

## 2020-07-24 LAB — CBC
HCT: 36.8 % (ref 36.0–46.0)
Hemoglobin: 12 g/dL (ref 12.0–15.0)
MCH: 33.4 pg (ref 26.0–34.0)
MCHC: 32.6 g/dL (ref 30.0–36.0)
MCV: 102.5 fL — ABNORMAL HIGH (ref 80.0–100.0)
Platelets: 137 10*3/uL — ABNORMAL LOW (ref 150–400)
RBC: 3.59 MIL/uL — ABNORMAL LOW (ref 3.87–5.11)
RDW: 13.1 % (ref 11.5–15.5)
WBC: 6.9 10*3/uL (ref 4.0–10.5)
nRBC: 0 % (ref 0.0–0.2)

## 2020-07-24 LAB — IRON AND TIBC
Iron: 94 ug/dL (ref 28–170)
Saturation Ratios: 29 % (ref 10.4–31.8)
TIBC: 323 ug/dL (ref 250–450)
UIBC: 229 ug/dL

## 2020-07-24 LAB — COMPREHENSIVE METABOLIC PANEL
ALT: 18 U/L (ref 0–44)
AST: 23 U/L (ref 15–41)
Albumin: 3.6 g/dL (ref 3.5–5.0)
Alkaline Phosphatase: 67 U/L (ref 38–126)
Anion gap: 9 (ref 5–15)
BUN: 33 mg/dL — ABNORMAL HIGH (ref 8–23)
CO2: 39 mmol/L — ABNORMAL HIGH (ref 22–32)
Calcium: 8.9 mg/dL (ref 8.9–10.3)
Chloride: 75 mmol/L — ABNORMAL LOW (ref 98–111)
Creatinine, Ser: 0.63 mg/dL (ref 0.44–1.00)
GFR, Estimated: >60 mL/min (ref >60)
Glucose, Bld: 166 mg/dL — ABNORMAL HIGH (ref 70–99)
Potassium: 5.5 mmol/L — ABNORMAL HIGH (ref 3.5–5.1)
Sodium: 123 mmol/L — ABNORMAL LOW (ref 135–145)
Total Bilirubin: 0.4 mg/dL (ref 0.3–1.2)
Total Protein: 6.3 g/dL — ABNORMAL LOW (ref 6.5–8.1)

## 2020-07-24 LAB — FOLATE: Folate: 38.8 ng/mL (ref >5.9)

## 2020-07-24 LAB — OSMOLALITY, URINE: Osmolality, Ur: 591 mOsm/kg (ref 300–900)

## 2020-07-24 LAB — MAGNESIUM: Magnesium: 2 mg/dL (ref 1.7–2.4)

## 2020-07-24 LAB — FERRITIN: Ferritin: 47 ng/mL (ref 11–307)

## 2020-07-24 LAB — POTASSIUM: Potassium: 5 mmol/L (ref 3.5–5.1)

## 2020-07-24 LAB — PHOSPHORUS: Phosphorus: 4.2 mg/dL (ref 2.5–4.6)

## 2020-07-24 LAB — VITAMIN B12: Vitamin B-12: 441 pg/mL (ref 180–914)

## 2020-07-24 MED ORDER — FUROSEMIDE 10 MG/ML IJ SOLN
40.0000 mg | Freq: Once | INTRAMUSCULAR | Status: AC
  Administered 2020-07-24: 40 mg via INTRAVENOUS
  Filled 2020-07-24: qty 4

## 2020-07-24 MED ORDER — AZITHROMYCIN 250 MG PO TABS
500.0000 mg | ORAL_TABLET | Freq: Every day | ORAL | Status: DC
  Administered 2020-07-25 – 2020-07-27 (×3): 500 mg via ORAL
  Filled 2020-07-24 (×3): qty 2

## 2020-07-24 MED ORDER — CHLORHEXIDINE GLUCONATE CLOTH 2 % EX PADS
6.0000 | MEDICATED_PAD | Freq: Every day | CUTANEOUS | Status: DC
  Administered 2020-07-27: 6 via TOPICAL

## 2020-07-24 MED ORDER — IRBESARTAN 300 MG PO TABS: 300.0000 mg | ORAL_TABLET | Freq: Every day | ORAL | Status: DC

## 2020-07-24 MED ORDER — MUPIROCIN 2 % EX OINT
1.0000 "application " | TOPICAL_OINTMENT | Freq: Two times a day (BID) | CUTANEOUS | Status: AC
  Administered 2020-07-24 – 2020-07-28 (×10): 1 via NASAL
  Filled 2020-07-24 (×2): qty 22

## 2020-07-24 MED ORDER — SODIUM ZIRCONIUM CYCLOSILICATE 5 G PO PACK
5.0000 g | PACK | Freq: Once | ORAL | Status: AC
  Administered 2020-07-24: 5 g via ORAL
  Filled 2020-07-24: qty 1

## 2020-07-24 NOTE — Evaluation (Signed)
Occupational Therapy Evaluation Patient Details Name: [name] MRN: [id number] DOB: 05/22/1945 Today's Date: 07/24/2020    History of Present Illness 75 year old with a history of COPD, DM2, ongoing tobacco abuse, chronic diastolic CHF, and PAF not on anticoagulation who presented to the ER with shortness of breath. CXR noted a right-sided infiltrate. Patient Admitted for respiratory failure and required Bipap.   Clinical Impression   [name] is a 75 year old woman who presents with decreased activity tolerance and impaired cardiopulmonary status on 4 L [place]. On evaluation patient min assist for standing from low surface and min guard for ambulation with RW. Overall min guard to min assist for ADLs which is a decline in her normal functional abilities which is modified independence. Patient's o2 sats above 90% on 4 L [place] with activity. Patient hopeful to return home at discharge. Patient will benefit from skilled OT services while in hospital to improve deficits and learn compensatory strategies as needed in order to return to PLOF.      Follow Up Recommendations  Home health OT    Equipment Recommendations  None recommended by OT    Recommendations for Other Services       Precautions / Restrictions Precautions Precautions: Fall Restrictions Weight Bearing Restrictions: No      Mobility Bed Mobility Overal bed mobility: Needs Assistance Bed Mobility: Supine to Sit     Supine to sit: Min guard     General bed mobility comments: increased time and use of bed rail to transfer into sitting    Transfers Overall transfer level: Needs assistance Equipment used: Rolling walker (2 wheeled) Transfers: Sit to/from Stand           General transfer comment: Min guard to stand from ICU bed height and hand hold to pivot to recliner. Patient min assist to rise from recliner and toilet height. Min guard to ambulate to bathroom on 4 L. O2 sat maintained above  90% during functional activity.    Balance Overall balance assessment: Needs assistance Sitting-balance support: No upper extremity supported;Feet supported Sitting balance-Leahy Scale: Fair     Standing balance support: During functional activity Standing balance-Leahy Scale: Fair Standing balance comment: able to take hands off of walker to wash hands at sink                           ADL either performed or assessed with clinical judgement   ADL Overall ADL's : Needs assistance/impaired Eating/Feeding: Independent   Grooming: Standing;Wash/dry hands   Upper Body Bathing: Set up;Sitting   Lower Body Bathing: Minimal assistance;Sit to/from stand   Upper Body Dressing : Set up;Sitting   Lower Body Dressing: Sit to/from stand;Minimal assistance   Toilet Transfer: Minimal assistance;Regular Toilet;Grab bars;RW Toilet Transfer Details (indicate cue type and reason): min assist to power up Toileting- Clothing Manipulation and Hygiene: Min guard;Sit to/from stand       Functional mobility during ADLs: Min guard;Rolling walker       Vision Patient Visual Report: No change from baseline       Perception     Praxis      Pertinent Vitals/Pain Pain Assessment: No/denies pain     Hand Dominance Right   Extremity/Trunk Assessment Upper Extremity Assessment Upper Extremity Assessment: Overall WFL for tasks assessed   Lower Extremity Assessment Lower Extremity Assessment: Defer to PT evaluation   Cervical / Trunk Assessment Cervical / Trunk Assessment: Kyphotic   Communication Communication  Communication: No difficulties   Cognition Arousal/Alertness: Awake/alert Behavior During Therapy: WFL for tasks assessed/performed Overall Cognitive Status: Within Functional Limits for tasks assessed                                     General Comments       Exercises     Shoulder Instructions      Home Living Family/patient expects to be  discharged to:: Private residence Living Arrangements: Alone Available Help at Discharge: Family;Available PRN/intermittently Type of Home: House Home Access: Stairs to enter [employer] of Steps: 3 Entrance Stairs-Rails: Right;Left Home Layout: One level     Bathroom Shower/Tub: Occupational psychologist: Handicapped height Bathroom Accessibility: No   Home Equipment: Environmental consultant - 4 wheels;Toilet riser;Wheelchair - [employer];Shower seat   Additional Comments: 4LO2 at home PTA, mostly mod I with ADLs, assist with IADLs support from family.       Prior Functioning/Environment Level of Independence: Independent with assistive device(s);Needs assistance  Gait / Transfers Assistance Needed: rollator ADL's / Homemaking Assistance Needed: assistance for grocery shopping. Predominantly eats premade meals or microwaved food            OT Problem List: Decreased activity tolerance;Cardiopulmonary status limiting activity;Impaired balance (sitting and/or standing)      OT Treatment/Interventions: Self-care/ADL training;DME and/or AE instruction;Therapeutic activities;Balance training;Patient/family education;Therapeutic exercise    OT Goals(Current goals can be found in the care plan section) Acute Rehab OT Goals Patient Stated Goal: to go home at discharge OT Goal Formulation: With patient Time For Goal Achievement: 08/07/20 Potential to Achieve Goals: Good  OT Frequency: Min 2X/week   Barriers to D/C:            Co-evaluation              AM-PAC OT "6 Clicks" Daily Activity     Outcome Measure Help from another person eating meals?: None Help from another person taking care of personal grooming?: A Little Help from another person toileting, which includes using toliet, bedpan, or urinal?: A Little Help from another person bathing (including washing, rinsing, drying)?: A Little Help from another person to put on and taking off regular  upper body clothing?: A Little Help from another person to put on and taking off regular lower body clothing?: A Little 6 Click Score: 19   End of Session Equipment Utilized During Treatment: Rolling walker Nurse Communication: Mobility status (okay to see)  Activity Tolerance: Patient tolerated treatment well Patient left: in chair;with call bell/phone within reach  OT Visit Diagnosis: Muscle weakness (generalized) (M62.81)                Time: 1320-1350 OT Time Calculation (min): 30 min Charges:  OT General Charges $OT Visit: 1 Visit OT Evaluation $OT Eval Low Complexity: 1 Low OT Treatments $Self Care/Home Management : 8-22 mins  Shawnna Pancake, OTR/L [place]  Office [phone] Pager: [phone]   Lenward Chancellor 07/24/2020, 2:12 PM

## 2020-07-24 NOTE — Progress Notes (Signed)
[name]  XKG:[id number] DOB: June 07, 1945 DOA: 07/22/2020 PCP: Tammy Amel, MD    Brief Narrative:  75 year old with a history of COPD, DM2, ongoing tobacco abuse, chronic diastolic CHF, and PAF not on anticoagulation who presented to the ER with shortness of breath which and rapidly progressed over a 24-hour period.  In the ER she was found to be hypoxic and hypercapnic.  CXR noted a right-sided infiltrate.  COVID test was negative.  Significant Events:  7/19 admit via ER  Consultants:  None  Code Status: FULL CODE  Antimicrobials:  Azithromycin 7/19 > Ceftriaxone 7/19 >  DVT prophylaxis: Lovenox  Subjective: Patient has not required BiPAP in more than 24 hours.  Seems to be a little sleepy this morning but easily arousable.  States that she is feeling slightly better.  She does use 4 L of oxygen at home.  She is now on the same here in the hospital.  Continues to have some wheezing.  Occasional dry cough. Denies any chest pain.   Assessment & Plan:  Acute COPD exacerbation/community-acquired pneumonia/acute on chronic respiratory failure with hypoxia and hypercapnia Noted to have right lower lobe pneumonia with effusion.  Patient initially required BiPAP.  Has been off of BiPAP for more than 24 hours. Wheezing appears to have improved based on previous notes.  Still has some wheezing.  Continue current treatment.  She is now down to 4 L of oxygen which is what she uses at home.  Hyponatremia Sodium level noted to be 123.  She has had previous history of hyponatremia as well some most of this appears to be chronic.  No mental status changes noted.  We will recheck tomorrow.  Check urine osmolality.  Hyperkalemia Potassium noted to be 5.5.  Not noted to be on any supplements.  We will recheck this afternoon.    Chronic diastolic CHF No evidence for fluid overload currently.  Continue to monitor   Uncontrolled HTN At home she is on amlodipine valsartan  combination as well as metoprolol XL.  In the hospital she is noted to be on amlodipine clonidine, metoprolol.  Blood pressure remains poorly controlled.  We will put her back on her ARB.  Will avoid using scheduled clonidine as much as possible.    Staph epi in blood culure Noted to be in 1 of 2 cultures.  Most likely a contaminant.  She remains afebrile.    DM2 - uncontrolled w/ hyperglycemia  Continue to monitor CBGs.  Noted to be on metformin at home.  HbA1c was 6.4 in 2021.    PAF Not on chronic anticoagulation -rate controlled  Ongoing tobacco abuse Continue to counsel on absolute need to discontinue tobacco abuse  Excessive alcohol intake Patient admits to drinking alcohol every day.  She has had 3 drinks of whiskey on a daily basis.  She was counseled.  Monitor for signs of withdrawal though she seems to be stable currently.  Thrombocytopenia Likely due to alcohol abuse  Macrocytosis  Likely due to alcoholism.  B12 and folate levels noted to be acceptable.    DVT prophylaxis: Continue Lovenox CODE STATUS: Full code Family communication: No family at bedside Disposition: PT and OT evaluation.  Will likely return home when improved.   Status is: Inpatient  Remains inpatient appropriate because:Inpatient level of care appropriate due to severity of illness  Dispo: The patient is from: Home              Anticipated d/c is to:  unclear  Patient currently is not medically stable to d/c.   Difficult to place patient No   Objective: Blood pressure (!) 158/42, pulse 65, temperature 97.6 F (36.4 C), temperature source Oral, resp. rate 19, height 5\' 8"  (1.727 m), weight 83.6 kg, SpO2 95 %.  Intake/Output Summary (Last 24 hours) at 07/24/2020 1034 Last data filed at 07/24/2020 0600 Gross per 24 hour  Intake 430 ml  Output --  Net 430 ml    Filed Weights   07/22/20 1510  Weight: 83.6 kg    Examination:  General appearance: Somnolent but easily  arousable.  In no distress. Resp: Improved air entry bilaterally with less wheezing.  Normal effort at rest. Cardio: S1-S2 is normal regular.  No S3-S4.  No rubs murmurs or bruit GI: Abdomen is soft.  Nontender nondistended.  Bowel sounds are present normal.  No masses organomegaly Extremities: No edema.   Neurologic:   No focal neurological deficits.     CBC: Recent Labs  Lab 07/22/20 0925 07/22/20 1509 07/23/20 0245 07/24/20 0233  WBC 5.3 4.8 3.3* 6.9  NEUTROABS 3.9  --   --   --   HGB 14.1 13.6 12.5 12.0  HCT 43.0 41.4 37.8 36.8  MCV 101.9* 101.7* 101.1* 102.5*  PLT 130* 129* 129* 137*    Basic Metabolic Panel: Recent Labs  Lab 07/22/20 1953 07/23/20 0245 07/24/20 0233  NA 125* 123* 123*  K 4.8 4.8 5.5*  CL 77* 76* 75*  CO2 36* 36* 39*  GLUCOSE 153* 156* 166*  BUN 23 23 33*  CREATININE 0.68 0.62 0.63  CALCIUM 9.1 9.0 8.9  MG  --   --  2.0  PHOS  --   --  4.2    GFR: Estimated Creatinine Clearance: 69.9 mL/min (by C-G formula based on SCr of 0.63 mg/dL).  Liver Function Tests: Recent Labs  Lab 07/24/20 0233  AST 23  ALT 18  ALKPHOS 67  BILITOT 0.4  PROT 6.3*  ALBUMIN 3.6   Recent Labs  Lab 07/22/20 0925  LIPASE 37     HbA1C: Hgb A1c MFr Bld  Date/Time Value Ref Range Status  09/23/2019 01:38 AM 6.4 (H) 4.8 - 5.6 % Final    Comment:    (NOTE) Pre diabetes:          5.7%-6.4%  Diabetes:              >6.4%  Glycemic control for   <7.0% adults with diabetes   04/21/2016 02:53 AM 6.0 (H) 4.8 - 5.6 % Final    Comment:    (NOTE)         Pre-diabetes: 5.7 - 6.4         Diabetes: >6.4         Glycemic control for adults with diabetes: <7.0     CBG: Recent Labs  Lab 07/23/20 1122 07/23/20 1720 07/23/20 1738 07/23/20 2129 07/24/20 0755  GLUCAP 142* 174* 164* 165* 151*     Recent Results (from the past 240 hour(s))  Resp Panel by RT-PCR (Flu A&B, Covid) Nasopharyngeal Swab     Status: None   Collection Time: 07/22/20  9:25 AM    Specimen: Nasopharyngeal Swab; Nasopharyngeal(NP) swabs in vial transport medium  Result Value Ref Range Status   SARS Coronavirus 2 by RT PCR NEGATIVE NEGATIVE Final    Comment: (NOTE) SARS-CoV-2 target nucleic acids are NOT DETECTED.  The SARS-CoV-2 RNA is generally detectable in upper respiratory specimens during the acute phase of infection. The  lowest concentration of SARS-CoV-2 viral copies this assay can detect is 138 copies/mL. A negative result does not preclude SARS-Cov-2 infection and should not be used as the sole basis for treatment or other patient management decisions. A negative result may occur with  improper specimen collection/handling, submission of specimen other than nasopharyngeal swab, presence of viral mutation(s) within the areas targeted by this assay, and inadequate number of viral copies(<138 copies/mL). A negative result must be combined with clinical observations, patient history, and epidemiological information. The expected result is Negative.  Fact Sheet for Patients:  EntrepreneurPulse.com.au  Fact Sheet for Healthcare Providers:  IncredibleEmployment.be  This test is no t yet approved or cleared by the [place] FDA and  has been authorized for detection and/or diagnosis of SARS-CoV-2 by FDA under an Emergency Use Authorization (EUA). This EUA will remain  in effect (meaning this test can be used) for the duration of the COVID-19 declaration under Section 564(b)(1) of the Act, 21 U.S.C.section 360bbb-3(b)(1), unless the authorization is terminated  or revoked sooner.       Influenza A by PCR NEGATIVE NEGATIVE Final   Influenza B by PCR NEGATIVE NEGATIVE Final    Comment: (NOTE) The Xpert Xpress SARS-CoV-2/FLU/RSV plus assay is intended as an aid in the diagnosis of influenza from Nasopharyngeal swab specimens and should not be used as a sole basis for treatment. Nasal washings and aspirates are  unacceptable for Xpert Xpress SARS-CoV-2/FLU/RSV testing.  Fact Sheet for Patients: EntrepreneurPulse.com.au  Fact Sheet for Healthcare Providers: IncredibleEmployment.be  This test is not yet approved or cleared by the [place] FDA and has been authorized for detection and/or diagnosis of SARS-CoV-2 by FDA under an Emergency Use Authorization (EUA). This EUA will remain in effect (meaning this test can be used) for the duration of the COVID-19 declaration under Section 564(b)(1) of the Act, 21 U.S.C. section 360bbb-3(b)(1), unless the authorization is terminated or revoked.  Performed at [facility], [place] [address], [place], [place] [address]   Culture, blood (routine x 2)     Status: None (Preliminary result)   Collection Time: 07/22/20  9:50 AM   Specimen: BLOOD  Result Value Ref Range Status   Specimen Description   Final    BLOOD LEFT ANTECUBITAL Performed at [facility] [address] [place], [place] [address]    Special Requests   Final    BOTTLES DRAWN AEROBIC AND ANAEROBIC Blood Culture results may not be optimal due to an inadequate volume of blood received in culture bottles Performed at [facility] [address] [place], [place] [address]    Culture  Setup Time   Final    GRAM POSITIVE COCCI IN BOTH AEROBIC AND ANAEROBIC BOTTLES CRITICAL RESULT CALLED TO, READ BACK BY AND VERIFIED WITH: PHARMD T GREEN [id number] AT 721 AM BY CM Performed at [facility], [place] [address] [place], [place] [address]    Culture GRAM POSITIVE COCCI  Final   Report Status PENDING  Incomplete  Blood Culture ID Panel (Reflexed)     Status: Abnormal   Collection Time: 07/22/20  9:50 AM  Result Value Ref Range Status   Enterococcus faecalis NOT DETECTED NOT DETECTED Final   Enterococcus Faecium NOT DETECTED NOT DETECTED Final   Listeria monocytogenes NOT DETECTED NOT DETECTED  Final   Staphylococcus species DETECTED (A) NOT DETECTED Final    Comment: CRITICAL RESULT CALLED TO, READ BACK BY AND VERIFIED WITH: Red[place] [id number]  AT 721 AM BY CM    Staphylococcus aureus (BCID) NOT DETECTED NOT DETECTED Final   Staphylococcus epidermidis DETECTED (A) NOT DETECTED Final    Comment: Methicillin (oxacillin) resistant coagulase negative staphylococcus. Possible blood culture contaminant (unless isolated from more than one blood culture draw or clinical case suggests pathogenicity). No antibiotic treatment is indicated for blood  culture contaminants. CRITICAL RESULT CALLED TO, READ BACK BY AND VERIFIED WITH: PHARMD T GREEN [id number] AT 721 AM BY CM    Staphylococcus lugdunensis NOT DETECTED NOT DETECTED Final   Streptococcus species NOT DETECTED NOT DETECTED Final   Streptococcus agalactiae NOT DETECTED NOT DETECTED Final   Streptococcus pneumoniae NOT DETECTED NOT DETECTED Final   Streptococcus pyogenes NOT DETECTED NOT DETECTED Final   A.calcoaceticus-baumannii NOT DETECTED NOT DETECTED Final   Bacteroides fragilis NOT DETECTED NOT DETECTED Final   Enterobacterales NOT DETECTED NOT DETECTED Final   Enterobacter cloacae complex NOT DETECTED NOT DETECTED Final   Escherichia coli NOT DETECTED NOT DETECTED Final   Klebsiella aerogenes NOT DETECTED NOT DETECTED Final   Klebsiella oxytoca NOT DETECTED NOT DETECTED Final   Klebsiella pneumoniae NOT DETECTED NOT DETECTED Final   Proteus species NOT DETECTED NOT DETECTED Final   Salmonella species NOT DETECTED NOT DETECTED Final   Serratia marcescens NOT DETECTED NOT DETECTED Final   Haemophilus influenzae NOT DETECTED NOT DETECTED Final   Neisseria meningitidis NOT DETECTED NOT DETECTED Final   Pseudomonas aeruginosa NOT DETECTED NOT DETECTED Final   Stenotrophomonas maltophilia NOT DETECTED NOT DETECTED Final   Candida albicans NOT DETECTED NOT DETECTED Final   Candida auris NOT DETECTED NOT DETECTED Final   Candida  glabrata NOT DETECTED NOT DETECTED Final   Candida krusei NOT DETECTED NOT DETECTED Final   Candida parapsilosis NOT DETECTED NOT DETECTED Final   Candida tropicalis NOT DETECTED NOT DETECTED Final   Cryptococcus neoformans/gattii NOT DETECTED NOT DETECTED Final   Methicillin resistance mecA/C DETECTED (A) NOT DETECTED Final    Comment: CRITICAL RESULT CALLED TO, READ BACK BY AND VERIFIED WITH: T GREEN [id number] AT 721 AM BY CM Performed at [facility], [address], [place], [place] [address]   Culture, blood (routine x 2)     Status: None (Preliminary result)   Collection Time: 07/22/20 11:05 AM   Specimen: BLOOD  Result Value Ref Range Status   Specimen Description   Final    BLOOD RIGHT ANTECUBITAL Performed at [facility] [address], [place], [place] [address]    Special Requests   Final    BOTTLES DRAWN AEROBIC AND ANAEROBIC Blood Culture adequate volume Performed at [facility] [address], [place], [place] [address]    Culture   Final    NO GROWTH < 24 HOURS Performed at [facility] [address], [place], [place] [address]    Report Status PENDING  Incomplete  MRSA Next Gen by PCR, Nasal     Status: Abnormal   Collection Time: 07/22/20  9:00 PM   Specimen: Nasal Mucosa; Nasal Swab  Result Value Ref Range Status   MRSA by PCR Next Gen DETECTED (A) NOT DETECTED Final    Comment: RESULT CALLED TO, READ BACK BY AND VERIFIED WITH: DANIELLE, RN @ 0025 ON 07/23/20 C VARNER (NOTE) The GeneXpert MRSA Assay (FDA approved for NASAL specimens only), is one component of a comprehensive MRSA colonization surveillance program. It is not intended to diagnose MRSA infection nor to guide or monitor treatment  for MRSA infections. Test performance is not FDA approved in patients less than 57 years old. Performed at [facility], [place] [address] [place], [place] [address]       Scheduled Meds:   (feeding supplement) PROSource Plus  30 mL Oral BID BM   amLODipine  10 mg Oral Daily   aspirin EC  81 mg Oral Daily   atorvastatin  20 mg Oral QPM   budesonide (PULMICORT) nebulizer solution  0.25 mg Nebulization BID   chlorhexidine  15 mL Mouth Rinse BID   Chlorhexidine Gluconate Cloth  6 each Topical Daily   Chlorhexidine Gluconate Cloth  6 each Topical Q0600   cloNIDine  0.1 mg Oral BID   enoxaparin (LOVENOX) injection  40 mg Subcutaneous Q24H   feeding supplement  237 mL Oral Q24H   insulin aspart  0-9 Units Subcutaneous TID WC   ipratropium-albuterol  3 mL Nebulization Q6H   mouth rinse  15 mL Mouth Rinse q12n4p   methylPREDNISolone (SOLU-MEDROL) injection  40 mg Intravenous Q12H   metoprolol tartrate  50 mg Oral BID   multivitamin with minerals  1 tablet Oral Daily   mupirocin ointment  1 application Nasal BID   pantoprazole  40 mg Oral Daily   PARoxetine  20 mg Oral Daily   Continuous Infusions:  azithromycin Stopped (07/23/20 1301)   cefTRIAXone (ROCEPHIN)  IV Stopped (07/23/20 1030)     LOS: 2 days    [facility] Office  [phone] Pager - Text Page per Shea Evans  If 7PM-7AM, please contact night-coverage per Amion 07/24/2020, 10:34 AM

## 2020-07-24 NOTE — Progress Notes (Signed)
PHARMACIST - PHYSICIAN COMMUNICATION DR:   [place] Pink CONCERNING: Antibiotic IV to Oral Route Change Policy  RECOMMENDATION: This patient is receiving Azithromycin by the intravenous route.  Based on criteria approved by the Pharmacy and Therapeutics Committee, the antibiotic(s) is/are being converted to the equivalent oral dose form(s).   DESCRIPTION: These criteria include: Patient being treated for a respiratory tract infection, urinary tract infection, cellulitis or clostridium difficile associated diarrhea if on metronidazole The patient is not neutropenic and does not exhibit a GI malabsorption state The patient is eating (either orally or via tube) and/or has been taking other orally administered medications for a least 24 hours The patient is improving clinically and has a Tmax < 100.5  If you have questions about this conversion, please contact the Pharmacy Department  []   [phone] )  [facility] []   [phone] )  [facility] []   [phone] )  [facility] []   [phone] )  [facility] []   [phone] )  [place], [place] [facility] [phone] 07/24/2020, 11:51 AM

## 2020-07-25 LAB — GLUCOSE, CAPILLARY
Glucose-Capillary: 156 mg/dL — ABNORMAL HIGH (ref 70–99)
Glucose-Capillary: 130 mg/dL — ABNORMAL HIGH (ref 70–99)
Glucose-Capillary: 207 mg/dL — ABNORMAL HIGH (ref 70–99)
Glucose-Capillary: 116 mg/dL — ABNORMAL HIGH (ref 70–99)

## 2020-07-25 LAB — BASIC METABOLIC PANEL
Anion gap: 8 (ref 5–15)
Anion gap: 11 (ref 5–15)
BUN: 53 mg/dL — ABNORMAL HIGH (ref 8–23)
BUN: 58 mg/dL — ABNORMAL HIGH (ref 8–23)
CO2: 36 mmol/L — ABNORMAL HIGH (ref 22–32)
CO2: 39 mmol/L — ABNORMAL HIGH (ref 22–32)
Calcium: 8.7 mg/dL — ABNORMAL LOW (ref 8.9–10.3)
Calcium: 8.9 mg/dL (ref 8.9–10.3)
Chloride: 78 mmol/L — ABNORMAL LOW (ref 98–111)
Chloride: 75 mmol/L — ABNORMAL LOW (ref 98–111)
Creatinine, Ser: 0.91 mg/dL (ref 0.44–1.00)
Creatinine, Ser: 0.94 mg/dL (ref 0.44–1.00)
GFR, Estimated: >60 mL/min (ref >60)
GFR, Estimated: >60 mL/min (ref >60)
Glucose, Bld: 161 mg/dL — ABNORMAL HIGH (ref 70–99)
Glucose, Bld: 130 mg/dL — ABNORMAL HIGH (ref 70–99)
Potassium: 5.5 mmol/L — ABNORMAL HIGH (ref 3.5–5.1)
Potassium: 4.6 mmol/L (ref 3.5–5.1)
Sodium: 122 mmol/L — ABNORMAL LOW (ref 135–145)
Sodium: 125 mmol/L — ABNORMAL LOW (ref 135–145)

## 2020-07-25 MED ORDER — FUROSEMIDE 10 MG/ML IJ SOLN
40.0000 mg | Freq: Once | INTRAMUSCULAR | Status: AC
  Administered 2020-07-25: 40 mg via INTRAVENOUS
  Filled 2020-07-25: qty 4

## 2020-07-25 MED ORDER — SODIUM CHLORIDE 1 G PO TABS
1.0000 g | ORAL_TABLET | Freq: Two times a day (BID) | ORAL | Status: DC
  Administered 2020-07-25 – 2020-07-28 (×7): 1 g via ORAL
  Filled 2020-07-25 (×8): qty 1

## 2020-07-25 MED ORDER — METHYLPREDNISOLONE SODIUM SUCC 40 MG IJ SOLR
40.0000 mg | INTRAMUSCULAR | Status: DC
  Administered 2020-07-25 – 2020-07-26 (×2): 40 mg via INTRAVENOUS
  Filled 2020-07-25 (×2): qty 1

## 2020-07-25 NOTE — Progress Notes (Signed)
[name]  [id number] DOB: October 13, 1945 DOA: 07/22/2020 PCP: Lujean Amel, MD    Brief Narrative:  75 year old with a history of COPD, DM2, ongoing tobacco abuse, chronic diastolic CHF, and PAF not on anticoagulation who presented to the ER with shortness of breath which and rapidly progressed over a 24-hour period.  In the ER she was found to be hypoxic and hypercapnic.  CXR noted a right-sided infiltrate.  COVID test was negative.  Significant Events:  7/19 admit via ER  Consultants:  None  Code Status: FULL CODE  Antimicrobials:  Azithromycin 7/19 > Ceftriaxone 7/19 >  DVT prophylaxis: Lovenox  Subjective: Patient not very communicative.  States that she is feeling better.  Denies any other complaints.  No chest pain.  No nausea or vomiting.     Assessment & Plan:  Acute COPD exacerbation/community-acquired pneumonia/acute on chronic respiratory failure with hypoxia and hypercapnia Noted to have right lower lobe pneumonia with effusion.  Patient initially required BiPAP.  Has not required BiPAP in more than 48 hours.  Wheezing appears to be improving.  Cut down on dose of steroids.  Continue nebulizer treatments.  She is currently on 4 L of oxygen via nasal cannula which is her usual home regimen.  Hyponatremia Sodium level remains low.  There is element of chronicity.  Etiology is unclear.  Urine osmolality was checked and was noted to be 591.  Could be suggestive of SIADH from her respiratory infection.  Will initiate fluid restriction.  Will give additional dose of Lasix.  We will place her on salt tablets.  Recheck labs later today and tomorrow.  If sodium level continues to worsen may need to involve nephrology.  Hyperkalemia Potassium level had improved yesterday but again noted to be elevated.  She is being given Lasix today.  We will recheck potassium this afternoon.    Chronic diastolic CHF Seems to be stable.  Uncontrolled HTN At home she is on  amlodipine valsartan combination as well as metoprolol XL.  In the hospital she is noted to be on amlodipine clonidine, metoprolol.  Blood pressure noted to be better in the last 12 to 24 hours.  Initially she was placed on ARB however due to elevated potassium level this was again discontinued.  Currently remains on amlodipine and metoprolol.    Staph epi in blood culure Noted to be in 1 of 2 cultures.  Most likely a contaminant.  She remains afebrile.    DM2 - uncontrolled w/ hyperglycemia  Reasonably well-controlled.  HbA1c 6.4 in 2021.  On metformin at home.  Continue SSI here in the hospital.  PAF Not on chronic anticoagulation -rate controlled  Ongoing tobacco abuse Continue to counsel on absolute need to discontinue tobacco abuse  Excessive alcohol intake Patient admits to drinking alcohol every day.  She has had 3 drinks of whiskey on a daily basis.  She was counseled.  No signs or symptoms of withdrawal currently.  Thrombocytopenia Likely due to alcohol abuse.  Counts are stable  Macrocytosis  Likely due to alcoholism.  B12 and folate levels noted to be acceptable.    DVT prophylaxis: Continue Lovenox CODE STATUS: Full code Family communication: No family at bedside Disposition: PT and OT evaluation.  Will likely return home when improved.   Status is: Inpatient  Remains inpatient appropriate because:Inpatient level of care appropriate due to severity of illness  Dispo: The patient is from: Home  Anticipated d/c is to:  unclear              Patient currently is not medically stable to d/c.   Difficult to place patient No   Objective: Blood pressure (!) 138/35, pulse (!) 57, temperature 97.9 F (36.6 C), temperature source Oral, resp. rate 18, height '5\' 8"'$  (1.727 m), weight 83.6 kg, SpO2 94 %.  Intake/Output Summary (Last 24 hours) at 07/25/2020 0937 Last data filed at 07/24/2020 2130 Gross per 24 hour  Intake 250 ml  Output 725 ml  Net -475 ml     Filed Weights   07/22/20 1510  Weight: 83.6 kg    Examination:   General appearance: Somnolent but easily arousable.  In no distress Resp: Normal effort at rest.  Few wheezing heard bilateral lungs.  Few crackles right base  Cardio: S1-S2 is normal regular.  No S3-S4.  No rubs murmurs or bruit GI: Abdomen is soft.  Nontender nondistended.  Bowel sounds are present normal.  No masses organomegaly Extremities: No edema.  Moving all of her extremities Neurologic: No focal neurological deficits.      CBC: Recent Labs  Lab 07/22/20 0925 07/22/20 1509 07/23/20 0245 07/24/20 0233  WBC 5.3 4.8 3.3* 6.9  NEUTROABS 3.9  --   --   --   HGB 14.1 13.6 12.5 12.0  HCT 43.0 41.4 37.8 36.8  MCV 101.9* 101.7* 101.1* 102.5*  PLT 130* 129* 129* 137*    Basic Metabolic Panel: Recent Labs  Lab 07/23/20 0245 07/24/20 0233 07/24/20 1134 07/25/20 0247  NA 123* 123*  --  122*  K 4.8 5.5* 5.0 5.5*  CL 76* 75*  --  78*  CO2 36* 39*  --  36*  GLUCOSE 156* 166*  --  161*  BUN 23 33*  --  53*  CREATININE 0.62 0.63  --  0.91  CALCIUM 9.0 8.9  --  8.7*  MG  --  2.0  --   --   PHOS  --  4.2  --   --     GFR: Estimated Creatinine Clearance: 61.5 mL/min (by C-G formula based on SCr of 0.91 mg/dL).  Liver Function Tests: Recent Labs  Lab 07/24/20 0233  AST 23  ALT 18  ALKPHOS 67  BILITOT 0.4  PROT 6.3*  ALBUMIN 3.6    Recent Labs  Lab 07/22/20 0925  LIPASE 37     HbA1C: Hgb A1c MFr Bld  Date/Time Value Ref Range Status  09/23/2019 01:38 AM 6.4 (H) 4.8 - 5.6 % Final    Comment:    (NOTE) Pre diabetes:          5.7%-6.4%  Diabetes:              >6.4%  Glycemic control for   <7.0% adults with diabetes   04/21/2016 02:53 AM 6.0 (H) 4.8 - 5.6 % Final    Comment:    (NOTE)         Pre-diabetes: 5.7 - 6.4         Diabetes: >6.4         Glycemic control for adults with diabetes: <7.0     CBG: Recent Labs  Lab 07/24/20 0755 07/24/20 1122 07/24/20 1623  07/24/20 2215 07/25/20 0723  GLUCAP 151* 127* 257* 119* 156*     Recent Results (from the past 240 hour(s))  Resp Panel by RT-PCR (Flu A&B, Covid) Nasopharyngeal Swab     Status: None   Collection Time: 07/22/20  9:25 AM   Specimen: Nasopharyngeal Swab; Nasopharyngeal(NP) swabs in vial transport medium  Result Value Ref Range Status   SARS Coronavirus 2 by RT PCR NEGATIVE NEGATIVE Final    Comment: (NOTE) SARS-CoV-2 target nucleic acids are NOT DETECTED.  The SARS-CoV-2 RNA is generally detectable in upper respiratory specimens during the acute phase of infection. The lowest concentration of SARS-CoV-2 viral copies this assay can detect is 138 copies/mL. A negative result does not preclude SARS-Cov-2 infection and should not be used as the sole basis for treatment or other patient management decisions. A negative result may occur with  improper specimen collection/handling, submission of specimen other than nasopharyngeal swab, presence of viral mutation(s) within the areas targeted by this assay, and inadequate number of viral copies(<138 copies/mL). A negative result must be combined with clinical observations, patient history, and epidemiological information. The expected result is Negative.  Fact Sheet for Patients:  EntrepreneurPulse.com.au  Fact Sheet for Healthcare Providers:  IncredibleEmployment.be  This test is no t yet approved or cleared by the [place] FDA and  has been authorized for detection and/or diagnosis of SARS-CoV-2 by FDA under an Emergency Use Authorization (EUA). This EUA will remain  in effect (meaning this test can be used) for the duration of the COVID-19 declaration under Section 564(b)(1) of the Act, 21 U.S.C.section 360bbb-3(b)(1), unless the authorization is terminated  or revoked sooner.       Influenza A by PCR NEGATIVE NEGATIVE Final   Influenza B by PCR NEGATIVE NEGATIVE Final    Comment:  (NOTE) The Xpert Xpress SARS-CoV-2/FLU/RSV plus assay is intended as an aid in the diagnosis of influenza from Nasopharyngeal swab specimens and should not be used as a sole basis for treatment. Nasal washings and aspirates are unacceptable for Xpert Xpress SARS-CoV-2/FLU/RSV testing.  Fact Sheet for Patients: EntrepreneurPulse.com.au  Fact Sheet for Healthcare Providers: IncredibleEmployment.be  This test is not yet approved or cleared by the [place] FDA and has been authorized for detection and/or diagnosis of SARS-CoV-2 by FDA under an Emergency Use Authorization (EUA). This EUA will remain in effect (meaning this test can be used) for the duration of the COVID-19 declaration under Section 564(b)(1) of the Act, 21 U.S.C. section 360bbb-3(b)(1), unless the authorization is terminated or revoked.  Performed at [facility], [place] [address] [place], [place] [address]   Culture, blood (routine x 2)     Status: Abnormal (Preliminary result)   Collection Time: 07/22/20  9:50 AM   Specimen: BLOOD  Result Value Ref Range Status   Specimen Description   Final    BLOOD LEFT ANTECUBITAL Performed at [place] [address], [place], [place] [address]    Special Requests   Final    BOTTLES DRAWN AEROBIC AND ANAEROBIC Blood Culture results may not be optimal due to an inadequate volume of blood received in culture bottles Performed at [facility] [address], [place], [place] [address]    Culture  Setup Time   Final    GRAM POSITIVE COCCI IN BOTH AEROBIC AND ANAEROBIC BOTTLES CRITICAL RESULT CALLED TO, READ BACK BY AND VERIFIED WITH: PHARMD T GREEN [id number] AT 56 AM BY CM    Culture (A)  Final    STAPHYLOCOCCUS EPIDERMIDIS STAPHYLOCOCCUS HOMINIS THE SIGNIFICANCE OF ISOLATING THIS ORGANISM FROM A SINGLE SET OF BLOOD CULTURES WHEN MULTIPLE SETS ARE DRAWN IS UNCERTAIN. PLEASE  NOTIFY THE MICROBIOLOGY DEPARTMENT WITHIN ONE WEEK IF SPECIATION AND SENSITIVITIES ARE REQUIRED. Performed  at [facility], [place] [address], [place], [place] [address]    Report Status PENDING  Incomplete  Blood Culture ID Panel (Reflexed)     Status: Abnormal   Collection Time: 07/22/20  9:50 AM  Result Value Ref Range Status   Enterococcus faecalis NOT DETECTED NOT DETECTED Final   Enterococcus Faecium NOT DETECTED NOT DETECTED Final   Listeria monocytogenes NOT DETECTED NOT DETECTED Final   Staphylococcus species DETECTED (A) NOT DETECTED Final    Comment: CRITICAL RESULT CALLED TO, READ BACK BY AND VERIFIED WITH: PHARMD T GREEN [id number] AT 721 AM BY CM    Staphylococcus aureus (BCID) NOT DETECTED NOT DETECTED Final   Staphylococcus epidermidis DETECTED (A) NOT DETECTED Final    Comment: Methicillin (oxacillin) resistant coagulase negative staphylococcus. Possible blood culture contaminant (unless isolated from more than one blood culture draw or clinical case suggests pathogenicity). No antibiotic treatment is indicated for blood  culture contaminants. CRITICAL RESULT CALLED TO, READ BACK BY AND VERIFIED WITH: PHARMD T GREEN [id number] AT 721 AM BY CM    Staphylococcus lugdunensis NOT DETECTED NOT DETECTED Final   Streptococcus species NOT DETECTED NOT DETECTED Final   Streptococcus agalactiae NOT DETECTED NOT DETECTED Final   Streptococcus pneumoniae NOT DETECTED NOT DETECTED Final   Streptococcus pyogenes NOT DETECTED NOT DETECTED Final   A.calcoaceticus-baumannii NOT DETECTED NOT DETECTED Final   Bacteroides fragilis NOT DETECTED NOT DETECTED Final   Enterobacterales NOT DETECTED NOT DETECTED Final   Enterobacter cloacae complex NOT DETECTED NOT DETECTED Final   Escherichia coli NOT DETECTED NOT DETECTED Final   Klebsiella aerogenes NOT DETECTED NOT DETECTED Final   Klebsiella oxytoca NOT DETECTED NOT DETECTED Final   Klebsiella pneumoniae NOT DETECTED NOT DETECTED Final    Proteus species NOT DETECTED NOT DETECTED Final   Salmonella species NOT DETECTED NOT DETECTED Final   Serratia marcescens NOT DETECTED NOT DETECTED Final   Haemophilus influenzae NOT DETECTED NOT DETECTED Final   Neisseria meningitidis NOT DETECTED NOT DETECTED Final   Pseudomonas aeruginosa NOT DETECTED NOT DETECTED Final   Stenotrophomonas maltophilia NOT DETECTED NOT DETECTED Final   Candida albicans NOT DETECTED NOT DETECTED Final   Candida auris NOT DETECTED NOT DETECTED Final   Candida glabrata NOT DETECTED NOT DETECTED Final   Candida krusei NOT DETECTED NOT DETECTED Final   Candida parapsilosis NOT DETECTED NOT DETECTED Final   Candida tropicalis NOT DETECTED NOT DETECTED Final   Cryptococcus neoformans/gattii NOT DETECTED NOT DETECTED Final   Methicillin resistance mecA/C DETECTED (A) NOT DETECTED Final    Comment: CRITICAL RESULT CALLED TO, READ BACK BY AND VERIFIED WITH: T GREEN [id number] AT 721 AM BY CM Performed at [facility], [address], [place], [place] [address]   Culture, blood (routine x 2)     Status: None (Preliminary result)   Collection Time: 07/22/20 11:05 AM   Specimen: BLOOD  Result Value Ref Range Status   Specimen Description   Final    BLOOD RIGHT ANTECUBITAL Performed at [facility] [address], [place], [place] [address]    Special Requests   Final    BOTTLES DRAWN AEROBIC AND ANAEROBIC Blood Culture adequate volume Performed at [facility] [address], [place], [place] [address]    Culture   Final    NO GROWTH 3 DAYS Performed at [facility], [place] [address] [place], [place] [address]    Report Status PENDING  Incomplete  MRSA Next  Gen by PCR, Nasal     Status: Abnormal   Collection Time: 07/22/20  9:00 PM   Specimen: Nasal Mucosa; Nasal Swab  Result Value Ref Range Status   MRSA by PCR Next Gen DETECTED (A) NOT DETECTED Final    Comment: RESULT CALLED TO, READ BACK BY AND VERIFIED  WITH: DANIELLE, RN @ 0025 ON 07/23/20 C VARNER (NOTE) The GeneXpert MRSA Assay (FDA approved for NASAL specimens only), is one component of a comprehensive MRSA colonization surveillance program. It is not intended to diagnose MRSA infection nor to guide or monitor treatment for MRSA infections. Test performance is not FDA approved in patients less than 73 years old. Performed at [facility], [place] [address] [place], [place] [address]       Scheduled Meds:  (feeding supplement) PROSource Plus  30 mL Oral BID BM   amLODipine  10 mg Oral Daily   aspirin EC  81 mg Oral Daily   atorvastatin  20 mg Oral QPM   azithromycin  500 mg Oral Daily   budesonide (PULMICORT) nebulizer solution  0.25 mg Nebulization BID   chlorhexidine  15 mL Mouth Rinse BID   Chlorhexidine Gluconate Cloth  6 each Topical Daily   Chlorhexidine Gluconate Cloth  6 each Topical Q0600   enoxaparin (LOVENOX) injection  40 mg Subcutaneous Q24H   feeding supplement  237 mL Oral Q24H   insulin aspart  0-9 Units Subcutaneous TID WC   ipratropium-albuterol  3 mL Nebulization Q6H   mouth rinse  15 mL Mouth Rinse q12n4p   methylPREDNISolone (SOLU-MEDROL) injection  40 mg Intravenous Q24H   metoprolol tartrate  50 mg Oral BID   multivitamin with minerals  1 tablet Oral Daily   mupirocin ointment  1 application Nasal BID   pantoprazole  40 mg Oral Daily   PARoxetine  20 mg Oral Daily   sodium chloride  1 g Oral BID WC   Continuous Infusions:  cefTRIAXone (ROCEPHIN)  IV 2 g (07/25/20 0924)     LOS: 3 days    [facility] Office  [phone] Pager - Text Page per Shea Evans  If 7PM-7AM, please contact night-coverage per Amion 07/25/2020, 9:37 AM

## 2020-07-25 NOTE — Evaluation (Signed)
Physical Therapy Evaluation Patient Details Name: [name] MRN: [id number] DOB: February 06, 1945 Today's Date: 07/25/2020   History of Present Illness  75 year old female admitted 07/24/20  for respiratory failure and required Bipap. Past medical history of COPD, DM2, ongoing tobacco abuse, chronic diastolic CHF, and PAF not on anticoagulation  Clinical Impression  Pt admitted with above diagnosis. Pt currently with functional limitations due to the deficits listed below (see PT Problem List). Pt will benefit from skilled PT to increase their independence and safety with mobility to allow discharge to the venue listed below.   Pt assisted to [facility] and then ambulated around room.  Pt declined ambulating in hallway.  Pt feels she will be able to return home upon d/c.     Follow Up Recommendations Home health PT    Equipment Recommendations  None recommended by PT    Recommendations for Other Services       Precautions / Restrictions Precautions Precautions: Fall Precaution Comments: chronic 4L O2      Mobility  Bed Mobility Overal bed mobility: Needs Assistance Bed Mobility: Supine to Sit     Supine to sit: Min guard     General bed mobility comments: increased time and effort    Transfers Overall transfer level: Needs assistance Equipment used: Rolling walker (2 wheeled) Transfers: Sit to/from Stand Sit to Stand: Min assist         General transfer comment: assist to rise and steady, pt transferred to [facility] and then recliner for rest breaks; pt remained in 4L O2 [place] and SPO2 also reading 70% however quickly improved back into 90s with pt not gripping RW  Ambulation/Gait Ambulation/Gait assistance: Min guard Gait Distance (Feet): 13 Feet Assistive device: Rolling walker (2 wheeled) Gait Pattern/deviations: Step-through pattern;Decreased stride length     General Gait Details: steady with RW, ambulated on 4L O2 [place], Spo2 94% upon sitting in recliner; pt declined  ambulating in hallway  Stairs            Wheelchair Mobility    Modified Rankin (Stroke Patients Only)       Balance                                             Pertinent Vitals/Pain Pain Assessment: No/denies pain    Home Living Family/patient expects to be discharged to:: Private residence Living Arrangements: Alone Available Help at Discharge: Family;Available PRN/intermittently Type of Home: House Home Access: Stairs to enter Entrance Stairs-Rails: Psychiatric nurse of Steps: 3 Home Layout: One level Home Equipment: Walker - 4 wheels;Toilet riser;Wheelchair - [employer];Shower seat Additional Comments: 4LO2 at home PTA, mostly mod I with ADLs, assist with IADLs support from family.     Prior Function Level of Independence: Independent with assistive device(s);Needs assistance   Gait / Transfers Assistance Needed: rollator  ADL's / Homemaking Assistance Needed: assistance for grocery shopping. Predominantly eats premade meals or microwaved food        Hand Dominance   Dominant Hand: Right    Extremity/Trunk Assessment        Lower Extremity Assessment Lower Extremity Assessment: Generalized weakness    Cervical / Trunk Assessment Cervical / Trunk Assessment: Kyphotic  Communication   Communication: No difficulties  Cognition Arousal/Alertness: Awake/alert Behavior During Therapy: Flat affect Overall Cognitive Status: Within Functional Limits for tasks assessed  General Comments      Exercises     Assessment/Plan    PT Assessment Patient needs continued PT services  PT Problem List Decreased strength;Decreased mobility;Decreased activity tolerance;Decreased balance;Decreased knowledge of use of DME       PT Treatment Interventions Gait training;DME instruction;Therapeutic exercise;Functional mobility training;Therapeutic  activities;Patient/family education    PT Goals (Current goals can be found in the Care Plan section)  Acute Rehab PT Goals PT Goal Formulation: With patient Time For Goal Achievement: 08/08/20 Potential to Achieve Goals: Good    Frequency Min 3X/week   Barriers to discharge        Co-evaluation               AM-PAC PT "6 Clicks" Mobility  Outcome Measure Help needed turning from your back to your side while in a flat bed without using bedrails?: A Little Help needed moving from lying on your back to sitting on the side of a flat bed without using bedrails?: A Little Help needed moving to and from a bed to a chair (including a wheelchair)?: A Little Help needed standing up from a chair using your arms (e.g., wheelchair or bedside chair)?: A Little Help needed to walk in hospital room?: A Little Help needed climbing 3-5 steps with a railing? : A Lot 6 Click Score: 17    End of Session Equipment Utilized During Treatment: Gait belt;Oxygen Activity Tolerance: Patient tolerated treatment well Patient left: in chair;with call bell/phone within reach;with chair alarm set Nurse Communication: Mobility status PT Visit Diagnosis: Other abnormalities of gait and mobility (R26.89)    Time: [id number] PT Time Calculation (min) (ACUTE ONLY): 23 min   Charges:   PT Evaluation $PT Eval Low Complexity: 1 Low     Kati PT, DPT Acute Rehabilitation Services Pager: [phone] Office: [phone]  York Ram E 07/25/2020, 12:26 PM

## 2020-07-26 LAB — BASIC METABOLIC PANEL
Anion gap: 9 (ref 5–15)
Anion gap: 8 (ref 5–15)
BUN: 59 mg/dL — ABNORMAL HIGH (ref 8–23)
BUN: 57 mg/dL — ABNORMAL HIGH (ref 8–23)
CO2: 38 mmol/L — ABNORMAL HIGH (ref 22–32)
CO2: 41 mmol/L — ABNORMAL HIGH (ref 22–32)
Calcium: 8.9 mg/dL (ref 8.9–10.3)
Calcium: 8.8 mg/dL — ABNORMAL LOW (ref 8.9–10.3)
Chloride: 76 mmol/L — ABNORMAL LOW (ref 98–111)
Chloride: 77 mmol/L — ABNORMAL LOW (ref 98–111)
Creatinine, Ser: 0.87 mg/dL (ref 0.44–1.00)
Creatinine, Ser: 0.76 mg/dL (ref 0.44–1.00)
GFR, Estimated: >60 mL/min (ref >60)
GFR, Estimated: >60 mL/min (ref >60)
Glucose, Bld: 158 mg/dL — ABNORMAL HIGH (ref 70–99)
Glucose, Bld: 115 mg/dL — ABNORMAL HIGH (ref 70–99)
Potassium: 5.2 mmol/L — ABNORMAL HIGH (ref 3.5–5.1)
Potassium: 4.9 mmol/L (ref 3.5–5.1)
Sodium: 123 mmol/L — ABNORMAL LOW (ref 135–145)
Sodium: 126 mmol/L — ABNORMAL LOW (ref 135–145)

## 2020-07-26 LAB — GLUCOSE, CAPILLARY
Glucose-Capillary: 181 mg/dL — ABNORMAL HIGH (ref 70–99)
Glucose-Capillary: 227 mg/dL — ABNORMAL HIGH (ref 70–99)
Glucose-Capillary: 147 mg/dL — ABNORMAL HIGH (ref 70–99)

## 2020-07-26 LAB — CULTURE, BLOOD (ROUTINE X 2)

## 2020-07-26 LAB — CORTISOL-AM, BLOOD: Cortisol - AM: 8.4 ug/dL (ref 6.7–22.6)

## 2020-07-26 MED ORDER — FUROSEMIDE 10 MG/ML IJ SOLN: 40.0000 mg | Freq: Once | INTRAMUSCULAR | Status: DC

## 2020-07-26 MED ORDER — METOPROLOL TARTRATE 5 MG/5ML IV SOLN: 2.5000 mg | Freq: Four times a day (QID) | INTRAVENOUS | Status: DC | PRN

## 2020-07-26 MED ORDER — SODIUM ZIRCONIUM CYCLOSILICATE 10 G PO PACK
10.0000 g | PACK | Freq: Once | ORAL | Status: AC
  Administered 2020-07-26: 10 g via ORAL
  Filled 2020-07-26: qty 1

## 2020-07-26 MED ORDER — COSYNTROPIN 0.25 MG IJ SOLR
0.2500 mg | Freq: Once | INTRAMUSCULAR | Status: AC
  Administered 2020-07-27: 0.25 mg via INTRAVENOUS
  Filled 2020-07-26: qty 0.25

## 2020-07-26 MED ORDER — SODIUM CHLORIDE 0.9 % IV SOLN: INTRAVENOUS | Status: AC

## 2020-07-26 NOTE — Progress Notes (Signed)
Patient appeared to flip into afib RVR on the monitor for several minutes, HR in 140s. RN was unable to obtain EKG to confirm rhythm before pt switched back to NSR. VS stable at this time and pt is asymptomatic. Dr. [place] Pink paged by this RN; parameters added by MD for nurses to not hold BB unless HR <50. (AM dose was given by this RN). MD confirmed pt may still transfer to tele. Will continue to monitor pt.

## 2020-07-26 NOTE — Progress Notes (Signed)
Report called to 4E RN. All questions answered at this time. All pt belongings and paper chart transported with pt. Pt was transferred upstairs in the bed on tele by RN and NT with 4L [place]. 4E will continue to carefully care for pt.

## 2020-07-26 NOTE — Progress Notes (Signed)
RN attempted to call report to 4E . Receiving RN was unavailable per Network engineer, callback number provided.

## 2020-07-26 NOTE — Progress Notes (Addendum)
[name]  [id number] DOB: 01-03-1946 DOA: 07/22/2020 PCP: Lujean Amel, MD    Brief Narrative:  75 year old with a history of COPD, DM2, ongoing tobacco abuse, chronic diastolic CHF, and PAF not on anticoagulation who presented to the ER with shortness of breath which and rapidly progressed over a 24-hour period.  In the ER she was found to be hypoxic and hypercapnic.  CXR noted a right-sided infiltrate.  COVID test was negative.  Significant Events:  7/19 admit via ER  Consultants:  None  Code Status: FULL CODE  Antimicrobials:  Azithromycin 7/19 > Ceftriaxone 7/19 >  DVT prophylaxis: Lovenox  Subjective: Patient mentions that she is feeling better in terms of her respiratory status.  Feels like she is back to her baseline.  Denies any nausea vomiting diarrhea.     Assessment & Plan:  Acute COPD exacerbation/community-acquired pneumonia/acute on chronic respiratory failure with hypoxia and hypercapnia Noted to have right lower lobe pneumonia with effusion.  Patient initially required BiPAP which has been taken off more than 3 days ago. Wheezing appears to have improved.  Steroid dose were decreased yesterday.  She is back on her usual home regimen of oxygen at 4 L/min.  Continue nebulizer treatments.  Since she was seen by PT and OT.  Home health is recommended.  Patient does live by herself.    Hyponatremia There is an element of chronicity.   Somewhat of a puzzling picture.  On hospital today was noted to be 591.  Urine sodium level was ordered but is still pending.  There was concern for SIADH due to her respiratory infection.  She was placed on fluid restriction and started on salt tablets.  Given dose of Lasix.  However her BUN is noted to be significantly higher today.  Sodium level stable for the most part.  It improved to 125.  I wonder if there is an element of hypovolemia.  We will gently hydrate her and recheck her sodium levels this afternoon.   TSH  was normal when recently checked.  Cortisol level was noted to be borderline low.  We will do a cosyntropin stimulation test tomorrow morning.  Hyperkalemia Better than yesterday.  We will give her a dose of Lokelma.  Recheck labs later today.  Chronic diastolic CHF Seems to be stable.  Uncontrolled HTN At home she is on amlodipine valsartan combination as well as metoprolol XL.  In the hospital she is noted to be on amlodipine clonidine, metoprolol.   Initially she was to be started on her ARB however due to elevated potassium level this was discontinued.  Currently remains on amlodipine and metoprolol.  Occasional high readings noted.  Continue to monitor for now    Staph epi in blood culure Noted to be in 1 of 2 cultures.  Most likely a contaminant.  She remains afebrile.    DM2 - uncontrolled w/ hyperglycemia  Reasonably well-controlled.  HbA1c 6.4 in 2021.  On metformin at home.  Continue SSI here in the hospital.  PAF Not on chronic anticoagulation -rate controlled  Ongoing tobacco abuse Patient counseled multiple times.  Excessive alcohol intake Patient admits to drinking alcohol every day.  She has had 3 drinks of whiskey on a daily basis.  She was counseled.  Does not have any signs or symptoms of withdrawal currently.  Thrombocytopenia Likely due to alcohol abuse.  Counts have been stable.  Macrocytosis  Likely due to alcoholism.  B12 and folate levels noted to be acceptable.  DVT prophylaxis: Lovenox CODE STATUS: Full code Family communication: No family at bedside Disposition: PT and OT evaluation.  Will likely return home when improved.  Will need home health at discharge.   Status is: Inpatient  Remains inpatient appropriate because:Inpatient level of care appropriate due to severity of illness  Dispo: The patient is from: Home              Anticipated d/c is to:  unclear              Patient currently is not medically stable to d/c.   Difficult to place  patient No   Objective: Blood pressure (!) 143/110, pulse 84, temperature (!) 97.5 F (36.4 C), temperature source Oral, resp. rate 19, height '5\' 8"'$  (1.727 m), weight 83.6 kg, SpO2 90 %.  Intake/Output Summary (Last 24 hours) at 07/26/2020 0920 Last data filed at 07/25/2020 2026 Gross per 24 hour  Intake 803.11 ml  Output 850 ml  Net -46.89 ml    Filed Weights   07/22/20 1510  Weight: 83.6 kg    Examination:  General appearance: Awake alert.  In no distress Resp: Normal effort at rest.  Improved aeration bilaterally.  Less wheezing compared to before.  No definite rhonchi or crackles. Cardio: S1-S2 is normal regular.  No S3-S4.  No rubs murmurs or bruit GI: Abdomen is soft.  Nontender nondistended.  Bowel sounds are present normal.  No masses organomegaly Extremities: No edema.  Full range of motion of lower extremities. Neurologic: Alert and oriented x3.  No focal neurological deficits.       CBC: Recent Labs  Lab 07/22/20 0925 07/22/20 1509 07/23/20 0245 07/24/20 0233  WBC 5.3 4.8 3.3* 6.9  NEUTROABS 3.9  --   --   --   HGB 14.1 13.6 12.5 12.0  HCT 43.0 41.4 37.8 36.8  MCV 101.9* 101.7* 101.1* 102.5*  PLT 130* 129* 129* 137*    Basic Metabolic Panel: Recent Labs  Lab 07/24/20 0233 07/24/20 1134 07/25/20 0247 07/25/20 1504 07/26/20 0140  NA 123*  --  122* 125* 123*  K 5.5*   < > 5.5* 4.6 5.2*  CL 75*  --  78* 75* 76*  CO2 39*  --  36* 39* 38*  GLUCOSE 166*  --  161* 130* 158*  BUN 33*  --  53* 58* 59*  CREATININE 0.63  --  0.91 0.94 0.87  CALCIUM 8.9  --  8.7* 8.9 8.9  MG 2.0  --   --   --   --   PHOS 4.2  --   --   --   --    < > = values in this interval not displayed.    GFR: Estimated Creatinine Clearance: 64.3 mL/min (by C-G formula based on SCr of 0.87 mg/dL).  Liver Function Tests: Recent Labs  Lab 07/24/20 0233  AST 23  ALT 18  ALKPHOS 67  BILITOT 0.4  PROT 6.3*  ALBUMIN 3.6    Recent Labs  Lab 07/22/20 0925  LIPASE 37      HbA1C: Hgb A1c MFr Bld  Date/Time Value Ref Range Status  09/23/2019 01:38 AM 6.4 (H) 4.8 - 5.6 % Final    Comment:    (NOTE) Pre diabetes:          5.7%-6.4%  Diabetes:              >6.4%  Glycemic control for   <7.0% adults with diabetes   04/21/2016 02:53 AM 6.0 (  H) 4.8 - 5.6 % Final    Comment:    (NOTE)         Pre-diabetes: 5.7 - 6.4         Diabetes: >6.4         Glycemic control for adults with diabetes: <7.0     CBG: Recent Labs  Lab 07/25/20 0723 07/25/20 1213 07/25/20 1553 07/25/20 2200 07/26/20 0808  GLUCAP 156* 130* 207* 116* 181*     Recent Results (from the past 240 hour(s))  Resp Panel by RT-PCR (Flu A&B, Covid) Nasopharyngeal Swab     Status: None   Collection Time: 07/22/20  9:25 AM   Specimen: Nasopharyngeal Swab; Nasopharyngeal(NP) swabs in vial transport medium  Result Value Ref Range Status   SARS Coronavirus 2 by RT PCR NEGATIVE NEGATIVE Final    Comment: (NOTE) SARS-CoV-2 target nucleic acids are NOT DETECTED.  The SARS-CoV-2 RNA is generally detectable in upper respiratory specimens during the acute phase of infection. The lowest concentration of SARS-CoV-2 viral copies this assay can detect is 138 copies/mL. A negative result does not preclude SARS-Cov-2 infection and should not be used as the sole basis for treatment or other patient management decisions. A negative result may occur with  improper specimen collection/handling, submission of specimen other than nasopharyngeal swab, presence of viral mutation(s) within the areas targeted by this assay, and inadequate number of viral copies(<138 copies/mL). A negative result must be combined with clinical observations, patient history, and epidemiological information. The expected result is Negative.  Fact Sheet for Patients:  EntrepreneurPulse.com.au  Fact Sheet for Healthcare Providers:  IncredibleEmployment.be  This test is no t yet  approved or cleared by the [place] FDA and  has been authorized for detection and/or diagnosis of SARS-CoV-2 by FDA under an Emergency Use Authorization (EUA). This EUA will remain  in effect (meaning this test can be used) for the duration of the COVID-19 declaration under Section 564(b)(1) of the Act, 21 U.S.C.section 360bbb-3(b)(1), unless the authorization is terminated  or revoked sooner.       Influenza A by PCR NEGATIVE NEGATIVE Final   Influenza B by PCR NEGATIVE NEGATIVE Final    Comment: (NOTE) The Xpert Xpress SARS-CoV-2/FLU/RSV plus assay is intended as an aid in the diagnosis of influenza from Nasopharyngeal swab specimens and should not be used as a sole basis for treatment. Nasal washings and aspirates are unacceptable for Xpert Xpress SARS-CoV-2/FLU/RSV testing.  Fact Sheet for Patients: EntrepreneurPulse.com.au  Fact Sheet for Healthcare Providers: IncredibleEmployment.be  This test is not yet approved or cleared by the [place] FDA and has been authorized for detection and/or diagnosis of SARS-CoV-2 by FDA under an Emergency Use Authorization (EUA). This EUA will remain in effect (meaning this test can be used) for the duration of the COVID-19 declaration under Section 564(b)(1) of the Act, 21 U.S.C. section 360bbb-3(b)(1), unless the authorization is terminated or revoked.  Performed at [facility], [place] [address], [place], [place] [address]   Culture, blood (routine x 2)     Status: Abnormal   Collection Time: 07/22/20  9:50 AM   Specimen: BLOOD  Result Value Ref Range Status   Specimen Description   Final    BLOOD LEFT ANTECUBITAL Performed at [facility] [address], [place], [place] [address]    Special Requests   Final    BOTTLES DRAWN AEROBIC AND ANAEROBIC Blood Culture results may not be optimal due to an inadequate volume of blood  received in culture  bottles Performed at [facility], [place] [address], [place], [place] [address]    Culture  Setup Time   Final    GRAM POSITIVE COCCI IN BOTH AEROBIC AND ANAEROBIC BOTTLES CRITICAL RESULT CALLED TO, READ BACK BY AND VERIFIED WITH: PHARMD T GREEN [id number] AT 5 AM BY CM    Culture (A)  Final    STAPHYLOCOCCUS EPIDERMIDIS STAPHYLOCOCCUS HOMINIS THE SIGNIFICANCE OF ISOLATING THIS ORGANISM FROM A SINGLE SET OF BLOOD CULTURES WHEN MULTIPLE SETS ARE DRAWN IS UNCERTAIN. PLEASE NOTIFY THE MICROBIOLOGY DEPARTMENT WITHIN ONE WEEK IF SPECIATION AND SENSITIVITIES ARE REQUIRED. Performed at [facility], [place] [address], [place], [place] [address]    Report Status 07/26/2020 FINAL  Final  Blood Culture ID Panel (Reflexed)     Status: Abnormal   Collection Time: 07/22/20  9:50 AM  Result Value Ref Range Status   Enterococcus faecalis NOT DETECTED NOT DETECTED Final   Enterococcus Faecium NOT DETECTED NOT DETECTED Final   Listeria monocytogenes NOT DETECTED NOT DETECTED Final   Staphylococcus species DETECTED (A) NOT DETECTED Final    Comment: CRITICAL RESULT CALLED TO, READ BACK BY AND VERIFIED WITH: PHARMD T GREEN [id number] AT 721 AM BY CM    Staphylococcus aureus (BCID) NOT DETECTED NOT DETECTED Final   Staphylococcus epidermidis DETECTED (A) NOT DETECTED Final    Comment: Methicillin (oxacillin) resistant coagulase negative staphylococcus. Possible blood culture contaminant (unless isolated from more than one blood culture draw or clinical case suggests pathogenicity). No antibiotic treatment is indicated for blood  culture contaminants. CRITICAL RESULT CALLED TO, READ BACK BY AND VERIFIED WITH: PHARMD T GREEN [id number] AT 721 AM BY CM    Staphylococcus lugdunensis NOT DETECTED NOT DETECTED Final   Streptococcus species NOT DETECTED NOT DETECTED Final   Streptococcus agalactiae NOT DETECTED NOT DETECTED Final   Streptococcus pneumoniae NOT DETECTED NOT DETECTED Final    Streptococcus pyogenes NOT DETECTED NOT DETECTED Final   A.calcoaceticus-baumannii NOT DETECTED NOT DETECTED Final   Bacteroides fragilis NOT DETECTED NOT DETECTED Final   Enterobacterales NOT DETECTED NOT DETECTED Final   Enterobacter cloacae complex NOT DETECTED NOT DETECTED Final   Escherichia coli NOT DETECTED NOT DETECTED Final   Klebsiella aerogenes NOT DETECTED NOT DETECTED Final   Klebsiella oxytoca NOT DETECTED NOT DETECTED Final   Klebsiella pneumoniae NOT DETECTED NOT DETECTED Final   Proteus species NOT DETECTED NOT DETECTED Final   Salmonella species NOT DETECTED NOT DETECTED Final   Serratia marcescens NOT DETECTED NOT DETECTED Final   Haemophilus influenzae NOT DETECTED NOT DETECTED Final   Neisseria meningitidis NOT DETECTED NOT DETECTED Final   Pseudomonas aeruginosa NOT DETECTED NOT DETECTED Final   Stenotrophomonas maltophilia NOT DETECTED NOT DETECTED Final   Candida albicans NOT DETECTED NOT DETECTED Final   Candida auris NOT DETECTED NOT DETECTED Final   Candida glabrata NOT DETECTED NOT DETECTED Final   Candida krusei NOT DETECTED NOT DETECTED Final   Candida parapsilosis NOT DETECTED NOT DETECTED Final   Candida tropicalis NOT DETECTED NOT DETECTED Final   Cryptococcus neoformans/gattii NOT DETECTED NOT DETECTED Final   Methicillin resistance mecA/C DETECTED (A) NOT DETECTED Final    Comment: CRITICAL RESULT CALLED TO, READ BACK BY AND VERIFIED WITH: T GREEN [id number] AT 721 AM BY CM Performed at [facility], [address], [place], [place] [address]   Culture, blood (routine x 2)     Status: None (Preliminary result)   Collection Time: 07/22/20  11:05 AM   Specimen: BLOOD  Result Value Ref Range Status   Specimen Description   Final    BLOOD RIGHT ANTECUBITAL Performed at [facility] [address] [place], [place] [address]    Special Requests   Final    BOTTLES DRAWN AEROBIC AND ANAEROBIC Blood Culture adequate  volume Performed at [facility] [address] [place], [place] [address]    Culture   Final    NO GROWTH 4 DAYS Performed at [facility], [place] [address], [place], [place] [address]    Report Status PENDING  Incomplete  MRSA Next Gen by PCR, Nasal     Status: Abnormal   Collection Time: 07/22/20  9:00 PM   Specimen: Nasal Mucosa; Nasal Swab  Result Value Ref Range Status   MRSA by PCR Next Gen DETECTED (A) NOT DETECTED Final    Comment: RESULT CALLED TO, READ BACK BY AND VERIFIED WITH: DANIELLE, RN @ 0025 ON 07/23/20 C VARNER (NOTE) The GeneXpert MRSA Assay (FDA approved for NASAL specimens only), is one component of a comprehensive MRSA colonization surveillance program. It is not intended to diagnose MRSA infection nor to guide or monitor treatment for MRSA infections. Test performance is not FDA approved in patients less than 1 years old. Performed at [facility], [place] [address] [place], [place] [address]       Scheduled Meds:  (feeding supplement) PROSource Plus  30 mL Oral BID BM   amLODipine  10 mg Oral Daily   aspirin EC  81 mg Oral Daily   atorvastatin  20 mg Oral QPM   azithromycin  500 mg Oral Daily   budesonide (PULMICORT) nebulizer solution  0.25 mg Nebulization BID   chlorhexidine  15 mL Mouth Rinse BID   Chlorhexidine Gluconate Cloth  6 each Topical Daily   Chlorhexidine Gluconate Cloth  6 each Topical Q0600   [START ON 07/27/2020] cosyntropin  0.25 mg Intravenous Once   enoxaparin (LOVENOX) injection  40 mg Subcutaneous Q24H   feeding supplement  237 mL Oral Q24H   insulin aspart  0-9 Units Subcutaneous TID WC   ipratropium-albuterol  3 mL Nebulization Q6H   mouth rinse  15 mL Mouth Rinse q12n4p   methylPREDNISolone (SOLU-MEDROL) injection  40 mg Intravenous Q24H   metoprolol tartrate  50 mg Oral BID   multivitamin with minerals  1 tablet Oral Daily   mupirocin ointment  1 application Nasal BID    pantoprazole  40 mg Oral Daily   PARoxetine  20 mg Oral Daily   sodium chloride  1 g Oral BID WC   sodium zirconium cyclosilicate  10 g Oral Once   Continuous Infusions:  sodium chloride     cefTRIAXone (ROCEPHIN)  IV Stopped (07/25/20 0954)     LOS: 4 days    [facility] Office  [phone] Pager - Text Page per Shea Evans  If 7PM-7AM, please contact night-coverage per Amion 07/26/2020, 9:20 AM

## 2020-07-27 LAB — BASIC METABOLIC PANEL
Anion gap: 8 (ref 5–15)
Anion gap: 7 (ref 5–15)
BUN: 44 mg/dL — ABNORMAL HIGH (ref 8–23)
BUN: 44 mg/dL — ABNORMAL HIGH (ref 8–23)
CO2: 39 mmol/L — ABNORMAL HIGH (ref 22–32)
CO2: 39 mmol/L — ABNORMAL HIGH (ref 22–32)
Calcium: 8.7 mg/dL — ABNORMAL LOW (ref 8.9–10.3)
Calcium: 8.8 mg/dL — ABNORMAL LOW (ref 8.9–10.3)
Chloride: 79 mmol/L — ABNORMAL LOW (ref 98–111)
Chloride: 84 mmol/L — ABNORMAL LOW (ref 98–111)
Creatinine, Ser: 0.69 mg/dL (ref 0.44–1.00)
Creatinine, Ser: 0.78 mg/dL (ref 0.44–1.00)
GFR, Estimated: >60 mL/min (ref >60)
GFR, Estimated: >60 mL/min (ref >60)
Glucose, Bld: 170 mg/dL — ABNORMAL HIGH (ref 70–99)
Glucose, Bld: 235 mg/dL — ABNORMAL HIGH (ref 70–99)
Potassium: 5.3 mmol/L — ABNORMAL HIGH (ref 3.5–5.1)
Potassium: 5.4 mmol/L — ABNORMAL HIGH (ref 3.5–5.1)
Sodium: 126 mmol/L — ABNORMAL LOW (ref 135–145)
Sodium: 130 mmol/L — ABNORMAL LOW (ref 135–145)

## 2020-07-27 LAB — GLUCOSE, CAPILLARY
Glucose-Capillary: 210 mg/dL — ABNORMAL HIGH (ref 70–99)
Glucose-Capillary: 183 mg/dL — ABNORMAL HIGH (ref 70–99)
Glucose-Capillary: 236 mg/dL — ABNORMAL HIGH (ref 70–99)
Glucose-Capillary: 182 mg/dL — ABNORMAL HIGH (ref 70–99)

## 2020-07-27 LAB — CULTURE, BLOOD (ROUTINE X 2)
Culture: NO GROWTH
Special Requests: ADEQUATE

## 2020-07-27 LAB — ACTH STIMULATION, 3 TIME POINTS
Cortisol, 30 Min: 16.1 ug/dL
Cortisol, 60 Min: 19.7 ug/dL
Cortisol, Base: 3.8 ug/dL

## 2020-07-27 MED ORDER — SODIUM CHLORIDE 0.9 % IV SOLN: INTRAVENOUS | Status: AC

## 2020-07-27 MED ORDER — SODIUM CHLORIDE 0.9 % IV BOLUS
500.0000 mL | Freq: Once | INTRAVENOUS | Status: AC
  Administered 2020-07-27: 500 mL via INTRAVENOUS

## 2020-07-27 MED ORDER — SODIUM ZIRCONIUM CYCLOSILICATE 10 G PO PACK
10.0000 g | PACK | Freq: Three times a day (TID) | ORAL | Status: AC
  Administered 2020-07-27 (×2): 10 g via ORAL
  Filled 2020-07-27 (×2): qty 1

## 2020-07-27 MED ORDER — SODIUM ZIRCONIUM CYCLOSILICATE 10 G PO PACK
10.0000 g | PACK | Freq: Once | ORAL | Status: AC
  Administered 2020-07-27: 10 g via ORAL
  Filled 2020-07-27: qty 1

## 2020-07-27 MED ORDER — PREDNISONE 20 MG PO TABS
40.0000 mg | ORAL_TABLET | Freq: Every day | ORAL | Status: DC
  Administered 2020-07-27 – 2020-07-28 (×2): 40 mg via ORAL
  Filled 2020-07-27 (×2): qty 2

## 2020-07-27 NOTE — Progress Notes (Signed)
Shift report:  Patient had uneventful night. Pt remains on 3-4 liters nasal cannula with humidification and 02 sat is stable in the 90's. Vital signs within normal parameters. Up to [facility] twice. Pt had intake of around 1100 Ml's overnight.

## 2020-07-27 NOTE — Progress Notes (Signed)
[name]  [id number] DOB: 1945/05/18 DOA: 07/22/2020 PCP: Lujean Amel, MD    Brief Narrative:  75 year old with a history of COPD, DM2, ongoing tobacco abuse, chronic diastolic CHF, and PAF not on anticoagulation who presented to the ER with shortness of breath which and rapidly progressed over a 24-hour period.  In the ER she was found to be hypoxic and hypercapnic.  CXR noted a right-sided infiltrate.  COVID test was negative.  Significant Events:  7/19 admit via ER  Consultants:  None  Code Status: FULL CODE  Antimicrobials:  Azithromycin 7/19 > Ceftriaxone 7/19 >  DVT prophylaxis: Lovenox  Subjective: Patient mentions that she is feeling better from a respiratory standpoint.  Feels stronger.  Denies any headaches.  No nausea vomiting.      Assessment & Plan:  Acute COPD exacerbation/community-acquired pneumonia/acute on chronic respiratory failure with hypoxia and hypercapnia Noted to have right lower lobe pneumonia with effusion.  Patient initially required BiPAP which has been taken off more than 3 days ago. Respiratory status is improving.  Wheezing has improved.  Steroid dose can be changed to oral.  She will complete course of antibiotics today.  She is saturating in the 90s on nasal cannula oxygen at 4 L/min which is what she uses at home. Seen by PT and OT.  Home health was recommended.  Patient does live by herself.   Hyponatremia There is an element of chronicity.   Somewhat of a puzzling picture.  Urine osmolality was noted to be 591.  Urine sodium level was ordered but is still pending.  There was initially concern for SIADH due to her respiratory infection.  She was placed on fluid restriction and started on salt tablets.  Given dose of Lasix.  However her BUN subsequently went.  She was noted to be somewhat on the dry side.  She was gently hydrated with normal saline with improvement in sodium to 126.  Remains 126 today.  Continue IV fluids for  some more time.  Recheck labs later today and tomorrow.    TSH was normal when recently checked.  Cortisol level was noted to be borderline low.  Cosyntropin stimulation test was ordered and is pending.    Hyperkalemia Potassium level noted to be slightly elevated today.  Give additional dose of Lokelma and recheck later today.    Chronic diastolic CHF Seems to be stable.  Uncontrolled HTN At home she is on amlodipine valsartan combination as well as metoprolol XL.  In the hospital she is noted to be on amlodipine clonidine, metoprolol.   Cannot give ARB due to hyperkalemia.  Blood pressure reasonably well controlled with occasional high readings.  Remains on amlodipine and metoprolol.  Staph epi in blood culure Noted to be in 1 of 2 cultures.  Most likely a contaminant.  She remains afebrile.    DM2 - uncontrolled w/ hyperglycemia  Reasonably well-controlled.  HbA1c 6.4 in 2021.  On metformin at home.  Continue SSI here in the hospital.  Paroxysmal atrial fibrillation On metoprolol.  Occasional episodes of tachycardia noted.  Not on anticoagulation.  Presumably due to her excessive alcohol intake.  Can be addressed in the outpatient setting.    Ongoing tobacco abuse Patient counseled multiple times.  Excessive alcohol intake Patient admits to drinking alcohol every day.  She has had 3 drinks of whiskey on a daily basis.  She was counseled.  Does not have any signs or symptoms of withdrawal currently.  Thrombocytopenia Likely due to  alcohol abuse.  Counts have been stable.  Macrocytosis  Likely due to alcoholism.  B12 and folate levels noted to be acceptable.    DVT prophylaxis: Lovenox CODE STATUS: Full code Family communication: No family at bedside Disposition: PT and OT evaluation.  Will likely return home when improved.  Will need home health at discharge.   Status is: Inpatient  Remains inpatient appropriate because:Inpatient level of care appropriate due to severity  of illness  Dispo: The patient is from: Home              Anticipated d/c is to:  unclear              Patient currently is not medically stable to d/c.   Difficult to place patient No   Objective: Blood pressure (!) 151/50, pulse 68, temperature 98.2 F (36.8 C), temperature source Oral, resp. rate 16, height '5\' 8"'$  (1.727 m), weight 87 kg, SpO2 92 %.  Intake/Output Summary (Last 24 hours) at 07/27/2020 1016 Last data filed at 07/27/2020 0600 Gross per 24 hour  Intake 2198.76 ml  Output 800 ml  Net 1398.76 ml    Filed Weights   07/22/20 1510 07/26/20 1804  Weight: 83.6 kg 87 kg    Examination:  General appearance: Awake alert.  In no distress Resp: Normal effort at rest.  Few scattered wheezing.  No rhonchi or crackles appreciated. Cardio: S1-S2 is normal regular.  No S3-S4.  No rubs murmurs or bruit GI: Abdomen is soft.  Nontender nondistended.  Bowel sounds are present normal.  No masses organomegaly Extremities: No edema.  Full range of motion of lower extremities. Neurologic: Alert and oriented x3.  No focal neurological deficits.       CBC: Recent Labs  Lab 07/22/20 0925 07/22/20 1509 07/23/20 0245 07/24/20 0233  WBC 5.3 4.8 3.3* 6.9  NEUTROABS 3.9  --   --   --   HGB 14.1 13.6 12.5 12.0  HCT 43.0 41.4 37.8 36.8  MCV 101.9* 101.7* 101.1* 102.5*  PLT 130* 129* 129* 137*    Basic Metabolic Panel: Recent Labs  Lab 07/24/20 0233 07/24/20 1134 07/26/20 0140 07/26/20 1315 07/27/20 0806  NA 123*   < > 123* 126* 126*  K 5.5*   < > 5.2* 4.9 5.3*  CL 75*   < > 76* 77* 79*  CO2 39*   < > 38* 41* 39*  GLUCOSE 166*   < > 158* 115* 170*  BUN 33*   < > 59* 57* 44*  CREATININE 0.63   < > 0.87 0.76 0.69  CALCIUM 8.9   < > 8.9 8.8* 8.7*  MG 2.0  --   --   --   --   PHOS 4.2  --   --   --   --    < > = values in this interval not displayed.    GFR: Estimated Creatinine Clearance: 71.2 mL/min (by C-G formula based on SCr of 0.69 mg/dL).  Liver Function  Tests: Recent Labs  Lab 07/24/20 0233  AST 23  ALT 18  ALKPHOS 67  BILITOT 0.4  PROT 6.3*  ALBUMIN 3.6    Recent Labs  Lab 07/22/20 0925  LIPASE 37     HbA1C: Hgb A1c MFr Bld  Date/Time Value Ref Range Status  09/23/2019 01:38 AM 6.4 (H) 4.8 - 5.6 % Final    Comment:    (NOTE) Pre diabetes:          5.7%-6.4%  Diabetes:              >  6.4%  Glycemic control for   <7.0% adults with diabetes   04/21/2016 02:53 AM 6.0 (H) 4.8 - 5.6 % Final    Comment:    (NOTE)         Pre-diabetes: 5.7 - 6.4         Diabetes: >6.4         Glycemic control for adults with diabetes: <7.0     CBG: Recent Labs  Lab 07/25/20 2200 07/26/20 0808 07/26/20 1136 07/26/20 1650 07/27/20 0741  GLUCAP 116* 181* 227* 147* 210*     Recent Results (from the past 240 hour(s))  Resp Panel by RT-PCR (Flu A&B, Covid) Nasopharyngeal Swab     Status: None   Collection Time: 07/22/20  9:25 AM   Specimen: Nasopharyngeal Swab; Nasopharyngeal(NP) swabs in vial transport medium  Result Value Ref Range Status   SARS Coronavirus 2 by RT PCR NEGATIVE NEGATIVE Final    Comment: (NOTE) SARS-CoV-2 target nucleic acids are NOT DETECTED.  The SARS-CoV-2 RNA is generally detectable in upper respiratory specimens during the acute phase of infection. The lowest concentration of SARS-CoV-2 viral copies this assay can detect is 138 copies/mL. A negative result does not preclude SARS-Cov-2 infection and should not be used as the sole basis for treatment or other patient management decisions. A negative result may occur with  improper specimen collection/handling, submission of specimen other than nasopharyngeal swab, presence of viral mutation(s) within the areas targeted by this assay, and inadequate number of viral copies(<138 copies/mL). A negative result must be combined with clinical observations, patient history, and epidemiological information. The expected result is Negative.  Fact Sheet for  Patients:  EntrepreneurPulse.com.au  Fact Sheet for Healthcare Providers:  IncredibleEmployment.be  This test is no t yet approved or cleared by the [place] FDA and  has been authorized for detection and/or diagnosis of SARS-CoV-2 by FDA under an Emergency Use Authorization (EUA). This EUA will remain  in effect (meaning this test can be used) for the duration of the COVID-19 declaration under Section 564(b)(1) of the Act, 21 U.S.C.section 360bbb-3(b)(1), unless the authorization is terminated  or revoked sooner.       Influenza A by PCR NEGATIVE NEGATIVE Final   Influenza B by PCR NEGATIVE NEGATIVE Final    Comment: (NOTE) The Xpert Xpress SARS-CoV-2/FLU/RSV plus assay is intended as an aid in the diagnosis of influenza from Nasopharyngeal swab specimens and should not be used as a sole basis for treatment. Nasal washings and aspirates are unacceptable for Xpert Xpress SARS-CoV-2/FLU/RSV testing.  Fact Sheet for Patients: EntrepreneurPulse.com.au  Fact Sheet for Healthcare Providers: IncredibleEmployment.be  This test is not yet approved or cleared by the [place] FDA and has been authorized for detection and/or diagnosis of SARS-CoV-2 by FDA under an Emergency Use Authorization (EUA). This EUA will remain in effect (meaning this test can be used) for the duration of the COVID-19 declaration under Section 564(b)(1) of the Act, 21 U.S.C. section 360bbb-3(b)(1), unless the authorization is terminated or revoked.  Performed at [facility], [place] [address], [place], [place] [address]   Culture, blood (routine x 2)     Status: Abnormal   Collection Time: 07/22/20  9:50 AM   Specimen: BLOOD  Result Value Ref Range Status   Specimen Description   Final    BLOOD LEFT ANTECUBITAL Performed at [facility] [address], [place], [place] [address]     Special Requests   Final    BOTTLES  DRAWN AEROBIC AND ANAEROBIC Blood Culture results may not be optimal due to an inadequate volume of blood received in culture bottles Performed at [facility], [place] [address] [place], [place] [address]    Culture  Setup Time   Final    GRAM POSITIVE COCCI IN BOTH AEROBIC AND ANAEROBIC BOTTLES CRITICAL RESULT CALLED TO, READ BACK BY AND VERIFIED WITH: PHARMD T GREEN [id number] AT 51 AM BY CM    Culture (A)  Final    STAPHYLOCOCCUS EPIDERMIDIS STAPHYLOCOCCUS HOMINIS THE SIGNIFICANCE OF ISOLATING THIS ORGANISM FROM A SINGLE SET OF BLOOD CULTURES WHEN MULTIPLE SETS ARE DRAWN IS UNCERTAIN. PLEASE NOTIFY THE MICROBIOLOGY DEPARTMENT WITHIN ONE WEEK IF SPECIATION AND SENSITIVITIES ARE REQUIRED. Performed at [facility], [place] [address] [place], [place] [address]    Report Status 07/26/2020 FINAL  Final  Blood Culture ID Panel (Reflexed)     Status: Abnormal   Collection Time: 07/22/20  9:50 AM  Result Value Ref Range Status   Enterococcus faecalis NOT DETECTED NOT DETECTED Final   Enterococcus Faecium NOT DETECTED NOT DETECTED Final   Listeria monocytogenes NOT DETECTED NOT DETECTED Final   Staphylococcus species DETECTED (A) NOT DETECTED Final    Comment: CRITICAL RESULT CALLED TO, READ BACK BY AND VERIFIED WITH: PHARMD T GREEN [id number] AT 721 AM BY CM    Staphylococcus aureus (BCID) NOT DETECTED NOT DETECTED Final   Staphylococcus epidermidis DETECTED (A) NOT DETECTED Final    Comment: Methicillin (oxacillin) resistant coagulase negative staphylococcus. Possible blood culture contaminant (unless isolated from more than one blood culture draw or clinical case suggests pathogenicity). No antibiotic treatment is indicated for blood  culture contaminants. CRITICAL RESULT CALLED TO, READ BACK BY AND VERIFIED WITH: PHARMD T GREEN [id number] AT 721 AM BY CM    Staphylococcus lugdunensis NOT DETECTED NOT DETECTED Final   Streptococcus species  NOT DETECTED NOT DETECTED Final   Streptococcus agalactiae NOT DETECTED NOT DETECTED Final   Streptococcus pneumoniae NOT DETECTED NOT DETECTED Final   Streptococcus pyogenes NOT DETECTED NOT DETECTED Final   A.calcoaceticus-baumannii NOT DETECTED NOT DETECTED Final   Bacteroides fragilis NOT DETECTED NOT DETECTED Final   Enterobacterales NOT DETECTED NOT DETECTED Final   Enterobacter cloacae complex NOT DETECTED NOT DETECTED Final   Escherichia coli NOT DETECTED NOT DETECTED Final   Klebsiella aerogenes NOT DETECTED NOT DETECTED Final   Klebsiella oxytoca NOT DETECTED NOT DETECTED Final   Klebsiella pneumoniae NOT DETECTED NOT DETECTED Final   Proteus species NOT DETECTED NOT DETECTED Final   Salmonella species NOT DETECTED NOT DETECTED Final   Serratia marcescens NOT DETECTED NOT DETECTED Final   Haemophilus influenzae NOT DETECTED NOT DETECTED Final   Neisseria meningitidis NOT DETECTED NOT DETECTED Final   Pseudomonas aeruginosa NOT DETECTED NOT DETECTED Final   Stenotrophomonas maltophilia NOT DETECTED NOT DETECTED Final   Candida albicans NOT DETECTED NOT DETECTED Final   Candida auris NOT DETECTED NOT DETECTED Final   Candida glabrata NOT DETECTED NOT DETECTED Final   Candida krusei NOT DETECTED NOT DETECTED Final   Candida parapsilosis NOT DETECTED NOT DETECTED Final   Candida tropicalis NOT DETECTED NOT DETECTED Final   Cryptococcus neoformans/gattii NOT DETECTED NOT DETECTED Final   Methicillin resistance mecA/C DETECTED (A) NOT DETECTED Final    Comment: CRITICAL RESULT CALLED TO, READ BACK BY AND VERIFIED WITH: T GREEN [id number] AT 721 AM BY CM Performed at [facility], [address] [place], [place] [address]   Culture,  blood (routine x 2)     Status: None   Collection Time: 07/22/20 11:05 AM   Specimen: BLOOD  Result Value Ref Range Status   Specimen Description   Final    BLOOD RIGHT ANTECUBITAL Performed at [facility] [address] [place], [place] [address]    Special Requests   Final    BOTTLES DRAWN AEROBIC AND ANAEROBIC Blood Culture adequate volume Performed at [facility] [address], [place], [place] [address]    Culture   Final    NO GROWTH 5 DAYS Performed at [facility], [place] [address], [place], [place] [address]    Report Status 07/27/2020 FINAL  Final  MRSA Next Gen by PCR, Nasal     Status: Abnormal   Collection Time: 07/22/20  9:00 PM   Specimen: Nasal Mucosa; Nasal Swab  Result Value Ref Range Status   MRSA by PCR Next Gen DETECTED (A) NOT DETECTED Final    Comment: RESULT CALLED TO, READ BACK BY AND VERIFIED WITH: DANIELLE, RN @ 0025 ON 07/23/20 C VARNER (NOTE) The GeneXpert MRSA Assay (FDA approved for NASAL specimens only), is one component of a comprehensive MRSA colonization surveillance program. It is not intended to diagnose MRSA infection nor to guide or monitor treatment for MRSA infections. Test performance is not FDA approved in patients less than 56 years old. Performed at [facility], [place] [address] [place], [place] [address]       Scheduled Meds:  (feeding supplement) PROSource Plus  30 mL Oral BID BM   amLODipine  10 mg Oral Daily   aspirin EC  81 mg Oral Daily   atorvastatin  20 mg Oral QPM   budesonide (PULMICORT) nebulizer solution  0.25 mg Nebulization BID   chlorhexidine  15 mL Mouth Rinse BID   Chlorhexidine Gluconate Cloth  6 each Topical Daily   Chlorhexidine Gluconate Cloth  6 each Topical Q0600   enoxaparin (LOVENOX) injection  40 mg Subcutaneous Q24H   feeding supplement  237 mL Oral Q24H   insulin aspart  0-9 Units Subcutaneous TID WC   ipratropium-albuterol  3 mL Nebulization Q6H   mouth rinse  15 mL Mouth Rinse q12n4p   metoprolol tartrate  50 mg Oral BID   multivitamin with minerals  1 tablet Oral Daily   mupirocin ointment  1 application Nasal BID   pantoprazole  40 mg Oral Daily   PARoxetine  20 mg  Oral Daily   predniSONE  40 mg Oral Q breakfast   sodium chloride  1 g Oral BID WC   sodium zirconium cyclosilicate  10 g Oral Once   Continuous Infusions:  sodium chloride       LOS: 5 days    [facility] Office  [phone] Pager - Text Page per Shea Evans  If 7PM-7AM, please contact night-coverage per [place] 07/27/2020, 10:16 AM

## 2020-07-28 LAB — BASIC METABOLIC PANEL
Anion gap: 7 (ref 5–15)
BUN: 36 mg/dL — ABNORMAL HIGH (ref 8–23)
CO2: 38 mmol/L — ABNORMAL HIGH (ref 22–32)
Calcium: 8.6 mg/dL — ABNORMAL LOW (ref 8.9–10.3)
Chloride: 84 mmol/L — ABNORMAL LOW (ref 98–111)
Creatinine, Ser: 0.64 mg/dL (ref 0.44–1.00)
GFR, Estimated: >60 mL/min (ref >60)
Glucose, Bld: 117 mg/dL — ABNORMAL HIGH (ref 70–99)
Potassium: 4.5 mmol/L (ref 3.5–5.1)
Sodium: 129 mmol/L — ABNORMAL LOW (ref 135–145)

## 2020-07-28 LAB — GLUCOSE, CAPILLARY
Glucose-Capillary: 102 mg/dL — ABNORMAL HIGH (ref 70–99)
Glucose-Capillary: 146 mg/dL — ABNORMAL HIGH (ref 70–99)

## 2020-07-28 MED ORDER — PREDNISONE 20 MG PO TABS: ORAL_TABLET | ORAL | 0 refills | Status: DC

## 2020-07-28 MED ORDER — AMLODIPINE BESYLATE 10 MG PO TABS: 10.0000 mg | ORAL_TABLET | Freq: Every day | ORAL | 2 refills | Status: DC

## 2020-07-28 NOTE — Progress Notes (Signed)
Occupational Therapy Treatment Patient Details Name: [name] MRN: [id number] DOB: 1945-01-23 Today's Date: 07/28/2020    History of present illness 75 year old female admitted 07/24/20  for respiratory failure and required Bipap. Past medical history of COPD, DM2, ongoing tobacco abuse, chronic diastolic CHF, and PAF not on anticoagulation   OT comments  Patient was seated in bed at start of session. Patient reported plan to d/c home today. Patient was educated on ECT and having caregiver support at home. Patient reported brother would assist if she needed it. Patient required min guard for sitting on edge of bed for bathing UB and LB with rolling walker as needed. Patient required min guard to transfer from edge of bed to recliner in room to increase independence in ADLs. Patient d/c plans remain appropriate.   Follow Up Recommendations  Home health OT    Equipment Recommendations  None recommended by OT    Recommendations for Other Services      Precautions / Restrictions Precautions Precautions: Fall Precaution Comments: chronic 4L O2       Mobility Bed Mobility   Bed Mobility: Supine to Sit     Supine to sit: Min guard     General bed mobility comments: increased time and effort    Transfers Overall transfer level: Needs assistance Equipment used: Rolling walker (2 wheeled) Transfers: Sit to/from Stand Sit to Stand: Min guard         General transfer comment: patient required increased time for transfer with RW with patient maintaining on 4L/O2 per min.    Balance                                           ADL either performed or assessed with clinical judgement   ADL       Grooming: Sitting;Wash/dry face;Wash/dry hands   Upper Body Bathing: Set up;Sitting   Lower Body Bathing: Sitting/lateral leans;Min guard   Upper Body Dressing : Set up;Sitting       Toilet Transfer: Min guard;RW           Functional mobility  during ADLs: Min guard;Rolling walker General ADL Comments: with assistance for O2 cord management     Vision       Perception     Praxis      Cognition Arousal/Alertness: Awake/alert Behavior During Therapy: Flat affect Overall Cognitive Status: Within Functional Limits for tasks assessed                                          Exercises     Shoulder Instructions       General Comments      Pertinent Vitals/ Pain       Pain Assessment: No/denies pain  Home Living                                          Prior Functioning/Environment              Frequency  Min 2X/week        Progress Toward Goals  OT Goals(current goals can now be found in the care plan section)  Progress towards OT goals: Progressing toward goals  Acute Rehab OT Goals Patient Stated Goal: to go home today OT Goal Formulation: With patient Time For Goal Achievement: 08/07/20 Potential to Achieve Goals: Good  Plan Discharge plan remains appropriate    Co-evaluation                 AM-PAC OT "6 Clicks" Daily Activity     Outcome Measure   Help from another person eating meals?: None Help from another person taking care of personal grooming?: None Help from another person toileting, which includes using toliet, bedpan, or urinal?: A Little Help from another person bathing (including washing, rinsing, drying)?: A Little Help from another person to put on and taking off regular upper body clothing?: A Little Help from another person to put on and taking off regular lower body clothing?: A Little 6 Click Score: 20    End of Session Equipment Utilized During Treatment: Rolling walker  OT Visit Diagnosis: Muscle weakness (generalized) (M62.81)   Activity Tolerance Patient tolerated treatment well   Patient Left with call bell/phone within reach;in bed   Nurse Communication Other (comment) (nurse cleared patient to participate)         Time: [id number] OT Time Calculation (min): 21 min  Charges: OT Treatments $Self Care/Home Management : 8-22 mins  Jackelyn Poling OTR/L, MS Acute Rehabilitation Department Office# [phone] Pager# [phone]    [place] 07/28/2020, 9:50 AM

## 2020-07-28 NOTE — Consult Note (Signed)
[facility] Inpatient Consult   07/28/2020  [name] [birth year] [id number]  [place] Organization [ACO] Patient: [name]   Patient pending for [facility] CM services after referral received in June for chronic case management services. Spoke with patient at bedside. HIPAA verified. Explained services to patient and provided [facility] CM brochure.   Patient denies need for food or transportation. States that she lives alone but her brother provides transportation to medical appointments and picks up her medications. States that she is able to afford medications. Notified patient that case manager would continue to follow up when she returns home to assess for community needs. Patient verbalizes understanding and verbally consents to follow up.  Of note, [facility] Care Management services does not replace or interfere with any services that are arranged by inpatient case management or social work.   Netta Cedars, MSN, [facility] Liaison Nurse Mobile Phone [phone]  Toll free office [phone]

## 2020-07-28 NOTE — TOC Transition Note (Signed)
Transition of Care [facility]) - CM/SW Discharge Note   Patient Details  Name: [name] MRN: [id number] Date of Birth: 07-30-1945  Transition of Care [facility]) CM/SW Contact:  Dessa Phi, RN Phone Number: 07/28/2020, 12:22 PM   Clinical Narrative: d/c home w/[facility] rep Levada Dy able to accept HHRN/PT/OT. Patient agrees to d/c plan.Patient has own transport home.No further  CM needs.    Final next level of care: [place] Barriers to Discharge: No Barriers Identified   Patient Goals and CMS Choice Patient states their goals for this hospitalization and ongoing recovery are:: to go home CMS Medicare.gov Compare Post Acute Care list provided to:: Patient    Discharge Placement                       Discharge Plan and Services   Discharge Planning Services: CM Consult                      HH Arranged: RN, PT, OT [facility] Agency: [place] Date [place]: 07/28/20 Time [place]: 1222 Representative spoke with at [place]: [place]) Interventions     Readmission Risk Interventions No flowsheet data found.

## 2020-07-28 NOTE — Discharge Summary (Signed)
[facility]  Physician Discharge Summary   Patient ID: [name] MRN: [id number] DOB/AGE: 01-16-1945 75 y.o.  Admit date: 07/22/2020 Discharge date: 07/28/2020    PCP: Tammy Amel, MD  DISCHARGE DIAGNOSES:  COPD with acute exacerbation Community-acquired pneumonia Acute on chronic respiratory failure with hypoxia and hypercapnia Hyponatremia Hyperkalemia possibly due to ARB Chronic diastolic CHF Essential hypertension Diabetes mellitus type 2 Paroxysmal atrial fibrillation not on anticoagulation  History of alcohol abuse   RECOMMENDATIONS FOR OUTPATIENT FOLLOW UP: Home health has been ordered Please note her ARB has been discontinued due to hyperkalemia.   Home Health: PT and OT Equipment/Devices: None  CODE STATUS: Full code  DISCHARGE CONDITION: fair  Diet recommendation: Modified carbohydrate  INITIAL HISTORY: 75 year old with a history of COPD, DM2, ongoing tobacco abuse, chronic diastolic CHF, and PAF not on anticoagulation who presented to the ER with shortness of breath which and rapidly progressed over a 24-hour period.  In the ER she was found to be hypoxic and hypercapnic.  CXR noted a right-sided infiltrate.  COVID test was negative.  HOSPITAL COURSE:   Acute COPD exacerbation/community-acquired pneumonia/acute on chronic respiratory failure with hypoxia and hypercapnia Noted to have right lower lobe pneumonia with effusion.  Patient initially required BiPAP. Patient respiratory status started improving.  Steroids were weaned down.  Completed course of antibiotics.  She is back to her baseline oxygen requirements of 4 L/min.  Seen by PT and OT.  Home health will be ordered at discharge.   Hyponatremia There is an element of chronicity.   Somewhat of a puzzling picture.  Urine osmolality was noted to be 591.  Urine sodium level was ordered but is still pending.  There was initially concern for SIADH due to her respiratory infection.  She  was placed on fluid restriction and started on salt tablets.  Given dose of Lasix.  However her BUN subsequently went up.  She was noted to be somewhat on the dry side.  She was gently hydrated with normal saline with improvement in sodium to 129. TSH was normal when recently checked.  Cortisol level was noted to be borderline low.  Cosyntropin stimulation test showed normal response.  Hyperkalemia Could be due to ARB.  Improved with Lokelma.  ARB will be discontinued.     Chronic diastolic CHF Seems to be stable.   Uncontrolled HTN At home she is on amlodipine valsartan combination as well as metoprolol XL.  In the hospital she is noted to be on amlodipine clonidine, metoprolol.   Cannot give ARB due to hyperkalemia.  B   Staph epi in blood culure Noted to be in 1 of 2 cultures.  Most likely a contaminant.     DM2 - uncontrolled w/ hyperglycemia Reasonably well-controlled.  HbA1c 6.4 in 2021.  On metformin at home.  Continue SSI here in the hospital.   Paroxysmal atrial fibrillation On metoprolol.  Occasional episodes of tachycardia were noted.  Not on anticoagulation.  Presumably due to her excessive alcohol intake.  Can be addressed in the outpatient setting.     Ongoing tobacco abuse Patient counseled multiple times.   Excessive alcohol intake Patient admits to drinking alcohol every day.  She has had 3 drinks of whiskey on a daily basis.  She was counseled.  Did not have any signs or symptoms of withdrawal in the hospital.   Thrombocytopenia Likely due to alcohol abuse.  Counts have been stable.   Macrocytosis  Likely due to alcoholism.  B12 and folate  levels noted to be acceptable.     Patient is stable.  Wants to go home today.  Okay for discharge.  PERTINENT LABS:  The results of significant diagnostics from this hospitalization (including imaging, microbiology, ancillary and laboratory) are listed below for reference.    Microbiology: Recent Results (from the past  240 hour(s))  Resp Panel by RT-PCR (Flu A&B, Covid) Nasopharyngeal Swab     Status: None   Collection Time: 07/22/20  9:25 AM   Specimen: Nasopharyngeal Swab; Nasopharyngeal(NP) swabs in vial transport medium  Result Value Ref Range Status   SARS Coronavirus 2 by RT PCR NEGATIVE NEGATIVE Final    Comment: (NOTE) SARS-CoV-2 target nucleic acids are NOT DETECTED.  The SARS-CoV-2 RNA is generally detectable in upper respiratory specimens during the acute phase of infection. The lowest concentration of SARS-CoV-2 viral copies this assay can detect is 138 copies/mL. A negative result does not preclude SARS-Cov-2 infection and should not be used as the sole basis for treatment or other patient management decisions. A negative result may occur with  improper specimen collection/handling, submission of specimen other than nasopharyngeal swab, presence of viral mutation(s) within the areas targeted by this assay, and inadequate number of viral copies(<138 copies/mL). A negative result must be combined with clinical observations, patient history, and epidemiological information. The expected result is Negative.  Fact Sheet for Patients:  EntrepreneurPulse.com.au  Fact Sheet for Healthcare Providers:  IncredibleEmployment.be  This test is no t yet approved or cleared by the [place] FDA and  has been authorized for detection and/or diagnosis of SARS-CoV-2 by FDA under an Emergency Use Authorization (EUA). This EUA will remain  in effect (meaning this test can be used) for the duration of the COVID-19 declaration under Section 564(b)(1) of the Act, 21 U.S.C.section 360bbb-3(b)(1), unless the authorization is terminated  or revoked sooner.       Influenza A by PCR NEGATIVE NEGATIVE Final   Influenza B by PCR NEGATIVE NEGATIVE Final    Comment: (NOTE) The Xpert Xpress SARS-CoV-2/FLU/RSV plus assay is intended as an aid in the diagnosis of influenza  from Nasopharyngeal swab specimens and should not be used as a sole basis for treatment. Nasal washings and aspirates are unacceptable for Xpert Xpress SARS-CoV-2/FLU/RSV testing.  Fact Sheet for Patients: EntrepreneurPulse.com.au  Fact Sheet for Healthcare Providers: IncredibleEmployment.be  This test is not yet approved or cleared by the [place] FDA and has been authorized for detection and/or diagnosis of SARS-CoV-2 by FDA under an Emergency Use Authorization (EUA). This EUA will remain in effect (meaning this test can be used) for the duration of the COVID-19 declaration under Section 564(b)(1) of the Act, 21 U.S.C. section 360bbb-3(b)(1), unless the authorization is terminated or revoked.  Performed at [facility], [place] [address] [place], [place] [address]   Culture, blood (routine x 2)     Status: Abnormal   Collection Time: 07/22/20  9:50 AM   Specimen: BLOOD  Result Value Ref Range Status   Specimen Description   Final    BLOOD LEFT ANTECUBITAL Performed at [facility] [address] [place], [place] [address]    Special Requests   Final    BOTTLES DRAWN AEROBIC AND ANAEROBIC Blood Culture results may not be optimal due to an inadequate volume of blood received in culture bottles Performed at [facility] [address] [place], [place] [address]    Culture  Setup Time   Final    Lonell Grandchild  POSITIVE COCCI IN BOTH AEROBIC AND ANAEROBIC BOTTLES CRITICAL RESULT CALLED TO, READ BACK BY AND VERIFIED WITH: PHARMD T GREEN [id number] AT 46 AM BY CM    Culture (A)  Final    STAPHYLOCOCCUS EPIDERMIDIS STAPHYLOCOCCUS HOMINIS THE SIGNIFICANCE OF ISOLATING THIS ORGANISM FROM A SINGLE SET OF BLOOD CULTURES WHEN MULTIPLE SETS ARE DRAWN IS UNCERTAIN. PLEASE NOTIFY THE MICROBIOLOGY DEPARTMENT WITHIN ONE WEEK IF SPECIATION AND SENSITIVITIES ARE REQUIRED. Performed at [facility], [place] [address], [place], [place] [address]    Report Status 07/26/2020 FINAL  Final  Blood Culture ID Panel (Reflexed)     Status: Abnormal   Collection Time: 07/22/20  9:50 AM  Result Value Ref Range Status   Enterococcus faecalis NOT DETECTED NOT DETECTED Final   Enterococcus Faecium NOT DETECTED NOT DETECTED Final   Listeria monocytogenes NOT DETECTED NOT DETECTED Final   Staphylococcus species DETECTED (A) NOT DETECTED Final    Comment: CRITICAL RESULT CALLED TO, READ BACK BY AND VERIFIED WITH: PHARMD T GREEN [id number] AT 721 AM BY CM    Staphylococcus aureus (BCID) NOT DETECTED NOT DETECTED Final   Staphylococcus epidermidis DETECTED (A) NOT DETECTED Final    Comment: Methicillin (oxacillin) resistant coagulase negative staphylococcus. Possible blood culture contaminant (unless isolated from more than one blood culture draw or clinical case suggests pathogenicity). No antibiotic treatment is indicated for blood  culture contaminants. CRITICAL RESULT CALLED TO, READ BACK BY AND VERIFIED WITH: PHARMD T GREEN [id number] AT 721 AM BY CM    Staphylococcus lugdunensis NOT DETECTED NOT DETECTED Final   Streptococcus species NOT DETECTED NOT DETECTED Final   Streptococcus agalactiae NOT DETECTED NOT DETECTED Final   Streptococcus pneumoniae NOT DETECTED NOT DETECTED Final   Streptococcus pyogenes NOT DETECTED NOT DETECTED Final   A.calcoaceticus-baumannii NOT DETECTED NOT DETECTED Final   Bacteroides fragilis NOT DETECTED NOT DETECTED Final   Enterobacterales NOT DETECTED NOT DETECTED Final   Enterobacter cloacae complex NOT DETECTED NOT DETECTED Final   Escherichia coli NOT DETECTED NOT DETECTED Final   Klebsiella aerogenes NOT DETECTED NOT DETECTED Final   Klebsiella oxytoca NOT DETECTED NOT DETECTED Final   Klebsiella pneumoniae NOT DETECTED NOT DETECTED Final   Proteus species NOT DETECTED NOT DETECTED Final   Salmonella species NOT DETECTED NOT DETECTED Final   Serratia marcescens NOT  DETECTED NOT DETECTED Final   Haemophilus influenzae NOT DETECTED NOT DETECTED Final   Neisseria meningitidis NOT DETECTED NOT DETECTED Final   Pseudomonas aeruginosa NOT DETECTED NOT DETECTED Final   Stenotrophomonas maltophilia NOT DETECTED NOT DETECTED Final   Candida albicans NOT DETECTED NOT DETECTED Final   Candida auris NOT DETECTED NOT DETECTED Final   Candida glabrata NOT DETECTED NOT DETECTED Final   Candida krusei NOT DETECTED NOT DETECTED Final   Candida parapsilosis NOT DETECTED NOT DETECTED Final   Candida tropicalis NOT DETECTED NOT DETECTED Final   Cryptococcus neoformans/gattii NOT DETECTED NOT DETECTED Final   Methicillin resistance mecA/C DETECTED (A) NOT DETECTED Final    Comment: CRITICAL RESULT CALLED TO, READ BACK BY AND VERIFIED WITH: T GREEN [id number] AT 721 AM BY CM Performed at [facility], [address] [place],  [address]   Culture, blood (routine x 2)     Status: None   Collection Time: 07/22/20 11:05 AM   Specimen: BLOOD  Result Value Ref Range Status   Specimen Description   Final    BLOOD RIGHT ANTECUBITAL Performed at [facility]  [address], [place], [place] [address]    Special Requests   Final    BOTTLES DRAWN AEROBIC AND ANAEROBIC Blood Culture adequate volume Performed at [facility] [address], [place], [place] [address]    Culture   Final    NO GROWTH 5 DAYS Performed at [facility], [place] [address] [place], [place] [address]    Report Status 07/27/2020 FINAL  Final  MRSA Next Gen by PCR, Nasal     Status: Abnormal   Collection Time: 07/22/20  9:00 PM   Specimen: Nasal Mucosa; Nasal Swab  Result Value Ref Range Status   MRSA by PCR Next Gen DETECTED (A) NOT DETECTED Final    Comment: RESULT CALLED TO, READ BACK BY AND VERIFIED WITH: DANIELLE, RN @ 0025 ON 07/23/20 C VARNER (NOTE) The GeneXpert MRSA Assay (FDA approved for NASAL specimens only), is one component of a  comprehensive MRSA colonization surveillance program. It is not intended to diagnose MRSA infection nor to guide or monitor treatment for MRSA infections. Test performance is not FDA approved in patients less than 79 years old. Performed at [facility], [place] [address] [place], [place] [address]      Labs:  COVID-19 Labs   Lab Results  Component Value Date   [place] 07/22/2020   [place] NEGATIVE 09/27/2019   [place] NEGATIVE 09/22/2019      Basic Metabolic Panel: Recent Labs  Lab 07/24/20 0233 07/24/20 1134 07/26/20 0140 07/26/20 1315 07/27/20 0806 07/27/20 1521 07/28/20 0429  NA 123*   < > 123* 126* 126* 130* 129*  K 5.5*   < > 5.2* 4.9 5.3* 5.4* 4.5  CL 75*   < > 76* 77* 79* 84* 84*  CO2 39*   < > 38* 41* 39* 39* 38*  GLUCOSE 166*   < > 158* 115* 170* 235* 117*  BUN 33*   < > 59* 57* 44* 44* 36*  CREATININE 0.63   < > 0.87 0.76 0.69 0.78 0.64  CALCIUM 8.9   < > 8.9 8.8* 8.7* 8.8* 8.6*  MG 2.0  --   --   --   --   --   --   PHOS 4.2  --   --   --   --   --   --    < > = values in this interval not displayed.   Liver Function Tests: Recent Labs  Lab 07/24/20 0233  AST 23  ALT 18  ALKPHOS 67  BILITOT 0.4  PROT 6.3*  ALBUMIN 3.6    CBC: Recent Labs  Lab 07/22/20 1509 07/23/20 0245 07/24/20 0233  WBC 4.8 3.3* 6.9  HGB 13.6 12.5 12.0  HCT 41.4 37.8 36.8  MCV 101.7* 101.1* 102.5*  PLT 129* 129* 137*    BNP: BNP (last 3 results) Recent Labs    09/22/19 0900 07/22/20 0925  BNP 269.6* 237.6*     CBG: Recent Labs  Lab 07/27/20 1114 07/27/20 1601 07/27/20 2121 07/28/20 0744 07/28/20 1116  GLUCAP 183* 236* 182* 102* 146*     IMAGING STUDIES DG Chest Port 1 View  Result Date: 07/22/2020 CLINICAL DATA:  Shortness of breath EXAM: PORTABLE CHEST 1 VIEW COMPARISON:  09/29/2019 FINDINGS: Dense opacity at the right base which is considered a combination of pulmonary opacity and pleural fluid.  Borderline heart size. Aortic tortuosity. No pneumothorax. Vascular congestion IMPRESSION: 1. Dense opacity at the right base, likely both moderate pleural effusion and pulmonary  opacification. 2. Vascular congestion. Electronically Signed   By: Monte Fantasia M.D.   On: 07/22/2020 10:04    DISCHARGE EXAMINATION: Vitals:   07/28/20 0433 07/28/20 0445 07/28/20 0820 07/28/20 0932  BP:  (!) 162/56    Pulse:  67  65  Resp:  18    Temp:  97.8 F (36.6 C)    TempSrc:  Oral    SpO2: 98% 94% 95%   Weight:      Height:       General appearance: Awake alert.  In no distress Resp: Improved aeration bilaterally with few scattered wheezing.  No crackles. Cardio: S1-S2 is normal regular.  No S3-S4.  No rubs murmurs or bruit GI: Abdomen is soft.  Nontender nondistended.  Bowel sounds are present normal.  No masses organomegaly    DISPOSITION: Home  Discharge Instructions     Call MD for:  difficulty breathing, headache or visual disturbances   Complete by: As directed    Call MD for:  extreme fatigue   Complete by: As directed    Call MD for:  persistant dizziness or light-headedness   Complete by: As directed    Call MD for:  persistant nausea and vomiting   Complete by: As directed    Call MD for:  severe uncontrolled pain   Complete by: As directed    Call MD for:  temperature >100.4   Complete by: As directed    Diet - low sodium heart healthy   Complete by: As directed    Diet Carb Modified   Complete by: As directed    Discharge instructions   Complete by: As directed    Please take your medications as prescribed.  Follow-up with your primary care provider next week to check your sodium levels at that time.  Seek attention if your symptoms were to get worse.  You were cared for by a hospitalist during your hospital stay. If you have any questions about your discharge medications or the care you received while you were in the hospital after you are discharged, you can call the  unit and asked to speak with the hospitalist on call if the hospitalist that took care of you is not available. Once you are discharged, your primary care physician will handle any further medical issues. Please note that NO REFILLS for any discharge medications will be authorized once you are discharged, as it is imperative that you return to your primary care physician (or establish a relationship with a primary care physician if you do not have one) for your aftercare needs so that they can reassess your need for medications and monitor your lab values. If you do not have a primary care physician, you can call [phone] for a physician referral.   Increase activity slowly   Complete by: As directed          Allergies as of 07/28/2020   No Known Allergies      Medication List     STOP taking these medications    amLODipine-valsartan 10-320 MG tablet Commonly known as: EXFORGE   furosemide 20 MG tablet Commonly known as: LASIX       TAKE these medications    albuterol 108 (90 Base) MCG/ACT inhaler Commonly known as: VENTOLIN HFA Inhale 2 puffs into the lungs every 4 (four) hours as needed for shortness of breath.   amLODipine 10 MG tablet Commonly known as: NORVASC Take 1 tablet (10 mg total) by mouth daily.   aspirin  EC 81 MG tablet Take 81 mg by mouth daily. Swallow whole.   atorvastatin 20 MG tablet Commonly known as: LIPITOR Take 20 mg by mouth every evening.   diazepam 5 MG tablet Commonly known as: VALIUM Take 1 tablet (5 mg total) by mouth every 12 (twelve) hours as needed for anxiety.   empagliflozin 10 MG Tabs tablet Commonly known as: JARDIANCE Take 10 mg by mouth daily.   feeding supplement Liqd Take 237 mLs by mouth 2 (two) times daily between meals.   ferrous sulfate 325 (65 FE) MG tablet Take 1 tablet (325 mg total) by mouth 2 (two) times daily with a meal. What changed: when to take this   ipratropium-albuterol 0.5-2.5 (3) MG/3ML  Soln Commonly known as: DUONEB Take 3 mLs by nebulization every 4 (four) hours as needed. What changed: reasons to take this   metFORMIN 500 MG tablet Commonly known as: GLUCOPHAGE Take 500 mg by mouth 2 (two) times daily with a meal.   metoprolol succinate 25 MG 24 hr tablet Commonly known as: TOPROL-XL Take 25 mg by mouth daily.   multivitamin with minerals Tabs tablet Take 1 tablet by mouth daily.   omeprazole 20 MG capsule Commonly known as: PRILOSEC Take 20 mg by mouth daily.   PARoxetine 20 MG tablet Commonly known as: PAXIL Take 20 mg by mouth daily.   predniSONE 20 MG tablet Commonly known as: DELTASONE Take 3 tablets once daily for 3 days followed by 2 tablets once daily for 3 days followed by 1 tablet once daily for 3 days and then stop   Trelegy Ellipta 200-62.5-25 MCG/INH Aepb Generic drug: Fluticasone-Umeclidin-Vilant Inhale 1 puff into the lungs daily.          Follow-up Information     Koirala, Dibas, MD Follow up in 1 week(s).   Specialty: Family Medicine Contact information: [place] [address] [phone]         Haze Boyden Barnhart Follow up.   Specialty: [place] Why: [facility] nursing/physical/occupational therapy Contact information: [place] [address] [phone]                 TOTAL DISCHARGE TIME: 42 minutes  [facility] Pager on [email or web address]  07/29/2020, 11:55 AM

## 2020-07-29 ENCOUNTER — Other Ambulatory Visit: Payer: Self-pay

## 2020-07-29 ENCOUNTER — Inpatient Hospital Stay (HOSPITAL_COMMUNITY)
Admission: EM | Admit: 2020-07-29 | Discharge: 2020-08-06 | DRG: 190 | Disposition: A | Discharge status: Home Health | Payer: Medicare HMO | Attending: Student | Admitting: Student

## 2020-07-29 ENCOUNTER — Encounter (HOSPITAL_COMMUNITY): Payer: Self-pay

## 2020-07-29 ENCOUNTER — Emergency Department (HOSPITAL_COMMUNITY): Payer: Medicare HMO

## 2020-07-29 DIAGNOSIS — J9602 Acute respiratory failure with hypercapnia: Secondary | ICD-10-CM | POA: Diagnosis not present

## 2020-07-29 DIAGNOSIS — E669 Obesity, unspecified: Secondary | ICD-10-CM | POA: Diagnosis present

## 2020-07-29 DIAGNOSIS — E1165 Type 2 diabetes mellitus with hyperglycemia: Secondary | ICD-10-CM | POA: Diagnosis present

## 2020-07-29 DIAGNOSIS — J90 Pleural effusion, not elsewhere classified: Secondary | ICD-10-CM | POA: Diagnosis not present

## 2020-07-29 DIAGNOSIS — J9601 Acute respiratory failure with hypoxia: Secondary | ICD-10-CM

## 2020-07-29 DIAGNOSIS — J439 Emphysema, unspecified: Principal | ICD-10-CM | POA: Diagnosis present

## 2020-07-29 DIAGNOSIS — F1020 Alcohol dependence, uncomplicated: Secondary | ICD-10-CM | POA: Diagnosis present

## 2020-07-29 DIAGNOSIS — I5033 Acute on chronic diastolic (congestive) heart failure: Secondary | ICD-10-CM | POA: Diagnosis present

## 2020-07-29 DIAGNOSIS — I10 Essential (primary) hypertension: Secondary | ICD-10-CM | POA: Diagnosis not present

## 2020-07-29 DIAGNOSIS — Z9851 Tubal ligation status: Secondary | ICD-10-CM

## 2020-07-29 DIAGNOSIS — Z8249 Family history of ischemic heart disease and other diseases of the circulatory system: Secondary | ICD-10-CM

## 2020-07-29 DIAGNOSIS — Z7289 Other problems related to lifestyle: Secondary | ICD-10-CM

## 2020-07-29 DIAGNOSIS — R0603 Acute respiratory distress: Secondary | ICD-10-CM | POA: Diagnosis not present

## 2020-07-29 DIAGNOSIS — Z9981 Dependence on supplemental oxygen: Secondary | ICD-10-CM

## 2020-07-29 DIAGNOSIS — R4182 Altered mental status, unspecified: Secondary | ICD-10-CM | POA: Diagnosis not present

## 2020-07-29 DIAGNOSIS — I272 Pulmonary hypertension, unspecified: Secondary | ICD-10-CM | POA: Diagnosis present

## 2020-07-29 DIAGNOSIS — I480 Paroxysmal atrial fibrillation: Secondary | ICD-10-CM | POA: Diagnosis present

## 2020-07-29 DIAGNOSIS — J918 Pleural effusion in other conditions classified elsewhere: Secondary | ICD-10-CM | POA: Diagnosis present

## 2020-07-29 DIAGNOSIS — F32A Depression, unspecified: Secondary | ICD-10-CM | POA: Diagnosis present

## 2020-07-29 DIAGNOSIS — Z6830 Body mass index (BMI) 30.0-30.9, adult: Secondary | ICD-10-CM

## 2020-07-29 DIAGNOSIS — E785 Hyperlipidemia, unspecified: Secondary | ICD-10-CM | POA: Diagnosis present

## 2020-07-29 DIAGNOSIS — E872 Acidosis: Secondary | ICD-10-CM | POA: Diagnosis present

## 2020-07-29 DIAGNOSIS — R531 Weakness: Secondary | ICD-10-CM | POA: Diagnosis not present

## 2020-07-29 DIAGNOSIS — I5032 Chronic diastolic (congestive) heart failure: Secondary | ICD-10-CM | POA: Diagnosis present

## 2020-07-29 DIAGNOSIS — Z794 Long term (current) use of insulin: Secondary | ICD-10-CM

## 2020-07-29 DIAGNOSIS — F17210 Nicotine dependence, cigarettes, uncomplicated: Secondary | ICD-10-CM | POA: Diagnosis present

## 2020-07-29 DIAGNOSIS — J9622 Acute and chronic respiratory failure with hypercapnia: Secondary | ICD-10-CM | POA: Diagnosis present

## 2020-07-29 DIAGNOSIS — F1090 Alcohol use, unspecified, uncomplicated: Secondary | ICD-10-CM

## 2020-07-29 DIAGNOSIS — L304 Erythema intertrigo: Secondary | ICD-10-CM | POA: Diagnosis present

## 2020-07-29 DIAGNOSIS — T380X5A Adverse effect of glucocorticoids and synthetic analogues, initial encounter: Secondary | ICD-10-CM | POA: Diagnosis present

## 2020-07-29 DIAGNOSIS — E119 Type 2 diabetes mellitus without complications: Secondary | ICD-10-CM

## 2020-07-29 DIAGNOSIS — J9811 Atelectasis: Secondary | ICD-10-CM | POA: Diagnosis present

## 2020-07-29 DIAGNOSIS — E876 Hypokalemia: Secondary | ICD-10-CM | POA: Diagnosis present

## 2020-07-29 DIAGNOSIS — J9621 Acute and chronic respiratory failure with hypoxia: Secondary | ICD-10-CM | POA: Diagnosis present

## 2020-07-29 DIAGNOSIS — Z743 Need for continuous supervision: Secondary | ICD-10-CM | POA: Diagnosis not present

## 2020-07-29 DIAGNOSIS — R059 Cough, unspecified: Secondary | ICD-10-CM | POA: Diagnosis not present

## 2020-07-29 DIAGNOSIS — Z9114 Patient's other noncompliance with medication regimen: Secondary | ICD-10-CM

## 2020-07-29 DIAGNOSIS — Z20822 Contact with and (suspected) exposure to covid-19: Secondary | ICD-10-CM | POA: Diagnosis present

## 2020-07-29 DIAGNOSIS — E871 Hypo-osmolality and hyponatremia: Secondary | ICD-10-CM | POA: Diagnosis present

## 2020-07-29 DIAGNOSIS — Z7982 Long term (current) use of aspirin: Secondary | ICD-10-CM

## 2020-07-29 DIAGNOSIS — Z8701 Personal history of pneumonia (recurrent): Secondary | ICD-10-CM

## 2020-07-29 DIAGNOSIS — J441 Chronic obstructive pulmonary disease with (acute) exacerbation: Secondary | ICD-10-CM | POA: Diagnosis present

## 2020-07-29 DIAGNOSIS — Z789 Other specified health status: Secondary | ICD-10-CM

## 2020-07-29 DIAGNOSIS — Z7984 Long term (current) use of oral hypoglycemic drugs: Secondary | ICD-10-CM

## 2020-07-29 DIAGNOSIS — I110 Hypertensive heart disease with heart failure: Secondary | ICD-10-CM | POA: Diagnosis present

## 2020-07-29 DIAGNOSIS — Z7951 Long term (current) use of inhaled steroids: Secondary | ICD-10-CM

## 2020-07-29 DIAGNOSIS — R58 Hemorrhage, not elsewhere classified: Secondary | ICD-10-CM | POA: Diagnosis not present

## 2020-07-29 DIAGNOSIS — Z79899 Other long term (current) drug therapy: Secondary | ICD-10-CM

## 2020-07-29 DIAGNOSIS — Z9119 Patient's noncompliance with other medical treatment and regimen: Secondary | ICD-10-CM

## 2020-07-29 LAB — BLOOD GAS, VENOUS
Acid-Base Excess: 14.7 mmol/L — ABNORMAL HIGH (ref 0.0–2.0)
Acid-Base Excess: 13.2 mmol/L — ABNORMAL HIGH (ref 0.0–2.0)
Bicarbonate: 46.9 mmol/L — ABNORMAL HIGH (ref 20.0–28.0)
Bicarbonate: 41.6 mmol/L — ABNORMAL HIGH (ref 20.0–28.0)
O2 Saturation: 94.9 %
O2 Saturation: 88.3 %
Patient temperature: 98.6
Patient temperature: 37
pCO2, Ven: 119 mmHg (ref 44.0–60.0)
pCO2, Ven: 78.7 mmHg (ref 44.0–60.0)
pH, Ven: 7.222 — ABNORMAL LOW (ref 7.250–7.430)
pH, Ven: 7.343 (ref 7.250–7.430)
pO2, Ven: 82.2 mmHg — ABNORMAL HIGH (ref 32.0–45.0)
pO2, Ven: 57 mmHg — ABNORMAL HIGH (ref 32.0–45.0)

## 2020-07-29 LAB — COMPREHENSIVE METABOLIC PANEL
ALT: 24 U/L (ref 0–44)
AST: 21 U/L (ref 15–41)
Albumin: 3.4 g/dL — ABNORMAL LOW (ref 3.5–5.0)
Alkaline Phosphatase: 58 U/L (ref 38–126)
Anion gap: 8 (ref 5–15)
BUN: 22 mg/dL (ref 8–23)
CO2: 46 mmol/L — ABNORMAL HIGH (ref 22–32)
Calcium: 9.2 mg/dL (ref 8.9–10.3)
Chloride: 81 mmol/L — ABNORMAL LOW (ref 98–111)
Creatinine, Ser: 0.43 mg/dL — ABNORMAL LOW (ref 0.44–1.00)
GFR, Estimated: >60 mL/min (ref >60)
Glucose, Bld: 126 mg/dL — ABNORMAL HIGH (ref 70–99)
Potassium: 4.7 mmol/L (ref 3.5–5.1)
Sodium: 135 mmol/L (ref 135–145)
Total Bilirubin: 0.6 mg/dL (ref 0.3–1.2)
Total Protein: 6 g/dL — ABNORMAL LOW (ref 6.5–8.1)

## 2020-07-29 LAB — CBC WITH DIFFERENTIAL/PLATELET
Abs Immature Granulocytes: 0.09 10*3/uL — ABNORMAL HIGH (ref 0.00–0.07)
Basophils Absolute: 0 10*3/uL (ref 0.0–0.1)
Basophils Relative: 0 %
Eosinophils Absolute: 0 10*3/uL (ref 0.0–0.5)
Eosinophils Relative: 0 %
HCT: 39.7 % (ref 36.0–46.0)
Hemoglobin: 12.3 g/dL (ref 12.0–15.0)
Immature Granulocytes: 1 %
Lymphocytes Relative: 10 %
Lymphs Abs: 0.7 10*3/uL (ref 0.7–4.0)
MCH: 33.2 pg (ref 26.0–34.0)
MCHC: 31 g/dL (ref 30.0–36.0)
MCV: 107.3 fL — ABNORMAL HIGH (ref 80.0–100.0)
Monocytes Absolute: 0.6 10*3/uL (ref 0.1–1.0)
Monocytes Relative: 9 %
Neutro Abs: 5.6 10*3/uL (ref 1.7–7.7)
Neutrophils Relative %: 80 %
Platelets: 94 10*3/uL — ABNORMAL LOW (ref 150–400)
RBC: 3.7 MIL/uL — ABNORMAL LOW (ref 3.87–5.11)
RDW: 12.6 % (ref 11.5–15.5)
WBC: 7 10*3/uL (ref 4.0–10.5)
nRBC: 0 % (ref 0.0–0.2)

## 2020-07-29 LAB — BRAIN NATRIURETIC PEPTIDE: B Natriuretic Peptide: 360.8 pg/mL — ABNORMAL HIGH (ref 0.0–100.0)

## 2020-07-29 MED ORDER — IPRATROPIUM-ALBUTEROL 0.5-2.5 (3) MG/3ML IN SOLN
3.0000 mL | RESPIRATORY_TRACT | Status: AC
Start: 2020-07-29 — End: 2020-07-29
  Administered 2020-07-29 (×2): 3 mL via RESPIRATORY_TRACT
  Filled 2020-07-29: qty 6

## 2020-07-29 NOTE — ED Triage Notes (Addendum)
Patient BIB [facility] for lethargy. EMS reports that patient's brother came to check on patient today since she was from hospital yesterday. Brother felt patient was weaker and more sleepy than yesterday. Patient wears 4L/[place] at all times. Patient was 96% on 4L/[facility] states that patient is at her basline

## 2020-07-29 NOTE — ED Provider Notes (Signed)
[facility] DEPT Provider Note   CSN: [id number] Arrival date & time: 07/29/20  1624     History Chief Complaint  Patient presents with  . Fatigue    [name] is a 75 y.o. female.  75 year old female with extensive past medical history including COPD on 4 L home oxygen, CHF, type 2 diabetes mellitus, hypertension, hyperlipidemia who presents with altered mental status.  Patient was discharged from the hospital yesterday and brother called EMS today reporting that the patient seemed weaker and more sleepy than she was yesterday.  EMS noted saturations in the 90s on home oxygen level.  Patient herself denies any complaints of pain.  LEVEL 5 CAVEAT DUE TO AMS  The history is provided by the EMS personnel and medical records.      Past Medical History:  Diagnosis Date  . Acute exacerbation of CHF (congestive heart failure) ([place]) 09/22/2019  . COPD (chronic obstructive pulmonary disease) ([place])   . Diabetes mellitus   . Diastolic dysfunction    history  . Hyperlipidemia   . Hypertension     Patient Active Problem List   Diagnosis Date Noted  . Acute respiratory failure with hypoxia and hypercapnia ([place]) 07/22/2020  . Acute on chronic respiratory failure with hypoxia and hypercapnia ([place]) 09/22/2019  . Pressure injury of skin 09/22/2019  . Labial infection 09/22/2019  . Acute exacerbation of CHF (congestive heart failure) ([place]) 09/22/2019  . Anemia 01/26/2018  . PVD (peripheral vascular disease) ([place]) 01/26/2018  . Ischemic ulcer diabetic foot ([place]) 01/26/2018  . Malnutrition of moderate degree 01/26/2018  . Critical limb ischemia with history of revascularization of same extremity ([place]) 12/21/2017  . Hypomagnesemia 08/25/2017  . Bilateral back pain 08/24/2017  . Compression fracture of thoracic vertebra ([facility]) 08/24/2017  . Hyponatremia 08/24/2017  . Hypokalemia 08/24/2017  . Fall 08/24/2017  . Microcytic anemia 08/24/2017  .  Alcohol use 08/24/2017  . Depression 08/24/2017  . Dyslipidemia associated with type 2 diabetes mellitus ([place]) 04/28/2016  . Diabetes mellitus, type II, insulin dependent ([place]) 04/28/2016  . GERD without esophagitis 04/28/2016  . O2 dependent 04/28/2016  . Community acquired pneumonia of right lower lobe of lung   . Sepsis due to pneumonia ([place]) 04/18/2016  . COPD exacerbation ([place]) 12/04/2010  . Smoker 12/04/2010  . Essential hypertension 01/31/2008  . Chronic respiratory failure ([place]) 01/31/2008    Past Surgical History:  Procedure Laterality Date  . TUBAL LIGATION       OB History   No obstetric history on file.     Family History  Problem Relation Age of Onset  . Heart disease Father   . Lung cancer Father     Social History   Tobacco Use  . Smoking status: Every Day    Packs/day: 1.00    Years: 52.00    Pack years: 52.00    Types: Cigarettes  . Smokeless tobacco: Never  Vaping Use  . Vaping Use: Never used  Substance Use Topics  . Alcohol use: Yes    Alcohol/week: 2.0 standard drinks    Types: 2 Shots of liquor per week  . Drug use: Never    Home Medications Prior to Admission medications   Medication Sig Start Date End Date Taking? Authorizing Provider  albuterol (VENTOLIN HFA) 108 (90 Base) MCG/ACT inhaler Inhale 2 puffs into the lungs every 4 (four) hours as needed for shortness of breath. 06/02/20   [provider]  amLODipine (NORVASC) 10 MG tablet Take 1  tablet (10 mg total) by mouth daily. 07/28/20 10/26/20  Bonnielee Haff, MD  aspirin EC 81 MG tablet Take 81 mg by mouth daily. Swallow whole.    [provider]  atorvastatin (LIPITOR) 20 MG tablet Take 20 mg by mouth every evening.    [provider]  diazepam (VALIUM) 5 MG tablet Take 1 tablet (5 mg total) by mouth every 12 (twelve) hours as needed for anxiety. 02/01/18   Ghimire, Henreitta Leber, MD  empagliflozin (JARDIANCE) 10 MG TABS tablet Take 10 mg by mouth daily.     [provider]  feeding supplement, ENSURE ENLIVE, (ENSURE ENLIVE) LIQD Take 237 mLs by mouth 2 (two) times daily between meals. 02/02/18   Ghimire, Henreitta Leber, MD  ferrous sulfate 325 (65 FE) MG tablet Take 1 tablet (325 mg total) by mouth 2 (two) times daily with a meal. 02/01/18   Ghimire, Henreitta Leber, MD  ipratropium-albuterol (DUONEB) 0.5-2.5 (3) MG/3ML SOLN Take 3 mLs by nebulization every 4 (four) hours as needed. 02/01/18   Ghimire, Henreitta Leber, MD  metFORMIN (GLUCOPHAGE) 500 MG tablet Take 500 mg by mouth 2 (two) times daily with a meal.    [provider]  metoprolol succinate (TOPROL-XL) 25 MG 24 hr tablet Take 25 mg by mouth daily. 06/22/20   [provider]  Multiple Vitamin (MULTIVITAMIN WITH MINERALS) TABS tablet Take 1 tablet by mouth daily.    [provider]  omeprazole (PRILOSEC) 20 MG capsule Take 20 mg by mouth daily. 11/18/10   [provider]  PARoxetine (PAXIL) 20 MG tablet Take 20 mg by mouth daily.     [provider]  predniSONE (DELTASONE) 20 MG tablet Take 3 tablets once daily for 3 days followed by 2 tablets once daily for 3 days followed by 1 tablet once daily for 3 days and then stop 07/28/20   Bonnielee Haff, MD  TRELEGY ELLIPTA 200-62.5-25 MCG/INH AEPB Inhale 1 puff into the lungs daily. 06/22/20   [provider]  apixaban (ELIQUIS) 5 MG TABS tablet Take 1 tablet (5 mg total) by mouth 2 (two) times daily. Patient not taking: Reported on 07/22/2020 09/28/19 07/23/20  Antonieta Pert, MD  metoprolol tartrate (LOPRESSOR) 25 MG tablet Take 1 tablet (25 mg total) by mouth 2 (two) times daily. Patient not taking: Reported on 07/22/2020 09/29/19 07/23/20  Antonieta Pert, MD    Allergies    Patient has no known allergies.  Review of Systems   Review of Systems  Unable to perform ROS: Mental status change    Physical Exam Updated Vital Signs BP (!) 141/73   Pulse 89   Temp 97.9 F (36.6 C) (Oral)   Resp (!) 24   Ht '5\' 8"'$   (1.727 m)   Wt 87 kg   SpO2 (!) 89%   BMI 29.16 kg/m   Physical Exam Constitutional:      General: She is not in acute distress.    Comments: Sleepy, arouses to voice and will nod yes or no but quickly falls back asleep  HENT:     Head: Normocephalic and atraumatic.  Eyes:     Conjunctiva/sclera: Conjunctivae normal.  Cardiovascular:     Rate and Rhythm: Normal rate and regular rhythm.     Heart sounds: Normal heart sounds. No murmur heard. Pulmonary:     Comments: Mildly increased work of breathing with prolonged expiratory phase, diminished breath sounds bilaterally with occasional end expiratory wheezes Abdominal:     General: Abdomen is flat.  Bowel sounds are normal. There is no distension.     Palpations: Abdomen is soft.     Tenderness: There is no abdominal tenderness.  Musculoskeletal:     Right lower leg: No edema.     Left lower leg: No edema.  Skin:    General: Skin is warm and dry.     Comments: Small skin tear R forearm; scattered bruises b/l forearms and across lower abdomen  Neurological:     Mental Status: She is disoriented.     Comments: fluent  Psychiatric:     Comments: Sleepy, altered    ED Results / Procedures / Treatments   Labs (all labs ordered are listed, but only abnormal results are displayed) Labs Reviewed  COMPREHENSIVE METABOLIC PANEL - Abnormal; Notable for the following components:      Result Value   Chloride 81 (*)    CO2 46 (*)    Glucose, Bld 126 (*)    Creatinine, Ser 0.43 (*)    Total Protein 6.0 (*)    Albumin 3.4 (*)    All other components within normal limits  CBC WITH DIFFERENTIAL/PLATELET - Abnormal; Notable for the following components:   RBC 3.70 (*)    MCV 107.3 (*)    Platelets 94 (*)    Abs Immature Granulocytes 0.09 (*)    All other components within normal limits  BLOOD GAS, VENOUS - Abnormal; Notable for the following components:   pH, Ven 7.222 (*)    pCO2, Ven 119 (*)    pO2, Ven 82.2 (*)    Bicarbonate  46.9 (*)    Acid-Base Excess 14.7 (*)    All other components within normal limits  BRAIN NATRIURETIC PEPTIDE - Abnormal; Notable for the following components:   B Natriuretic Peptide 360.8 (*)    All other components within normal limits  BLOOD GAS, VENOUS - Abnormal; Notable for the following components:   pCO2, Ven 78.7 (*)    pO2, Ven 57.0 (*)    Bicarbonate 41.6 (*)    Acid-Base Excess 13.2 (*)    All other components within normal limits  RESP PANEL BY RT-PCR (FLU A&B, COVID) ARPGX2  URINALYSIS, ROUTINE W REFLEX MICROSCOPIC    EKG None  Radiology CT Head Wo Contrast  Result Date: 07/29/2020 CLINICAL DATA:  Mental status changes of unknown cause, lethargy. 75 year old female. EXAM: CT HEAD WITHOUT CONTRAST TECHNIQUE: Contiguous axial images were obtained from the base of the skull through the vertex without intravenous contrast. COMPARISON:  September 22, 2019. FINDINGS: Brain: No evidence of acute infarction, hemorrhage, hydrocephalus, extra-axial collection or mass lesion/mass effect. Mild atrophy and stable cystic lesion in the posterior fossa biased towards the RIGHT behind the cerebellum. Vascular: No hyperdense vessel or unexpected calcification. Skull: Normal. Negative for fracture or focal lesion. Sinuses/Orbits: Visualized paranasal sinuses and orbits with minimal LEFT maxillary and RIGHT sphenoid sinus disease. No air-fluid level. Orbits are unremarkable. Other: None IMPRESSION: 1. No acute intracranial pathology. 2. Stable posterior fossa cystic lesion, likely a arachnoid cyst. 3. Minimal sinus disease. Electronically Signed   By: Zetta Bills M.D.   On: 07/29/2020 19:41   DG Chest Port 1 View  Result Date: 07/29/2020 CLINICAL DATA:  Altered mental status. EXAM: PORTABLE CHEST 1 VIEW COMPARISON:  July 22, 2020 FINDINGS: Mild, diffusely increased lung markings are seen. Moderate to marked severity right basilar atelectasis and/or infiltrate is seen. This is very mildly  decreased in severity when compared to the prior study. Mild left basilar  atelectasis and/or infiltrate is also noted. There is a small to moderate size right pleural effusion. Mild to moderate severity enlargement of the cardiac silhouette is seen. There is diffuse calcification of the thoracic aorta. The visualized skeletal structures are unremarkable. IMPRESSION: 1. Stable cardiomegaly with bibasilar atelectasis and/or infiltrate, right greater than left. 2. Small to moderate sized right pleural effusion. Electronically Signed   By: Virgina Norfolk M.D.   On: 07/29/2020 19:53    Procedures Procedures  CRITICAL CARE Performed by: Wenda Overland Garyn Waguespack   Total critical care time: 30 minutes  Critical care time was exclusive of separately billable procedures and treating other patients.  Critical care was necessary to treat or prevent imminent or life-threatening deterioration.  Critical care was time spent personally by me on the following activities: development of treatment plan with patient and/or surrogate as well as nursing, discussions with consultants, evaluation of patient's response to treatment, examination of patient, obtaining history from patient or surrogate, ordering and performing treatments and interventions, ordering and review of laboratory studies, ordering and review of radiographic studies, pulse oximetry and re-evaluation of patient's condition.  Medications Ordered in ED Medications  ipratropium-albuterol (DUONEB) 0.5-2.5 (3) MG/3ML nebulizer solution 3 mL (3 mLs Nebulization Given 07/29/20 1850)    ED Course  I have reviewed the triage vital signs and the nursing notes.  Pertinent labs & imaging results that were available during my care of the patient were reviewed by me and considered in my medical decision making (see chart for details).    MDM Rules/Calculators/A&P                           Patient was somnolent, arousable by voice but quickly fell back  asleep on initial exam.  Lab work notable for initial venous pH 7.22 with PCO2 119, BNP 360, normal WBC count, chloride 81, creatinine 0.43.  Head CT negative acute and chest x-ray showing right-sided pleural effusion.  After receiving the patient's initial blood gas, placed on BiPAP for hypercarbic respiratory failure.  After a few hours on BiPAP, her blood gas has improved to normal pH and PCO2 in the 70s.  She is more easily arousable on exam and clinically appears improved.  Discussed admission with [facility] hospitalist, Dr. Alcario Drought.  Updated the patient's brother over the phone. Final Clinical Impression(s) / ED Diagnoses Final diagnoses:  Acute respiratory failure with hypoxia and hypercapnia [facility])    Rx / DC Orders ED Discharge Orders     None        Alyssamae Klinck, Wenda Overland, MD 07/30/20 0023

## 2020-07-29 NOTE — ED Notes (Signed)
Critical result of PCO2 of 119 reported to Dr. Rex Kras, MD. Orders placed for BiPap

## 2020-07-29 NOTE — ED Notes (Signed)
Per Dr. Rex Kras and Respiratory, if pt is at 85% oxygen on bipap that is acceptable in regards to her baseline.

## 2020-07-30 ENCOUNTER — Inpatient Hospital Stay (HOSPITAL_COMMUNITY): Payer: Medicare HMO

## 2020-07-30 ENCOUNTER — Encounter (HOSPITAL_COMMUNITY): Payer: Self-pay | Admitting: Internal Medicine

## 2020-07-30 DIAGNOSIS — F17210 Nicotine dependence, cigarettes, uncomplicated: Secondary | ICD-10-CM | POA: Diagnosis present

## 2020-07-30 DIAGNOSIS — E119 Type 2 diabetes mellitus without complications: Secondary | ICD-10-CM

## 2020-07-30 DIAGNOSIS — L304 Erythema intertrigo: Secondary | ICD-10-CM | POA: Diagnosis not present

## 2020-07-30 DIAGNOSIS — I517 Cardiomegaly: Secondary | ICD-10-CM | POA: Diagnosis not present

## 2020-07-30 DIAGNOSIS — I5033 Acute on chronic diastolic (congestive) heart failure: Secondary | ICD-10-CM | POA: Diagnosis not present

## 2020-07-30 DIAGNOSIS — Z794 Long term (current) use of insulin: Secondary | ICD-10-CM

## 2020-07-30 DIAGNOSIS — J9601 Acute respiratory failure with hypoxia: Secondary | ICD-10-CM

## 2020-07-30 DIAGNOSIS — J9602 Acute respiratory failure with hypercapnia: Secondary | ICD-10-CM

## 2020-07-30 DIAGNOSIS — J9811 Atelectasis: Secondary | ICD-10-CM | POA: Diagnosis present

## 2020-07-30 DIAGNOSIS — E669 Obesity, unspecified: Secondary | ICD-10-CM | POA: Diagnosis present

## 2020-07-30 DIAGNOSIS — I5032 Chronic diastolic (congestive) heart failure: Secondary | ICD-10-CM | POA: Diagnosis not present

## 2020-07-30 DIAGNOSIS — Z515 Encounter for palliative care: Secondary | ICD-10-CM | POA: Diagnosis not present

## 2020-07-30 DIAGNOSIS — E872 Acidosis: Secondary | ICD-10-CM | POA: Diagnosis not present

## 2020-07-30 DIAGNOSIS — I10 Essential (primary) hypertension: Secondary | ICD-10-CM | POA: Diagnosis not present

## 2020-07-30 DIAGNOSIS — E785 Hyperlipidemia, unspecified: Secondary | ICD-10-CM | POA: Diagnosis present

## 2020-07-30 DIAGNOSIS — R69 Illness, unspecified: Secondary | ICD-10-CM | POA: Diagnosis not present

## 2020-07-30 DIAGNOSIS — I272 Pulmonary hypertension, unspecified: Secondary | ICD-10-CM | POA: Diagnosis not present

## 2020-07-30 DIAGNOSIS — E876 Hypokalemia: Secondary | ICD-10-CM | POA: Diagnosis present

## 2020-07-30 DIAGNOSIS — J449 Chronic obstructive pulmonary disease, unspecified: Secondary | ICD-10-CM | POA: Diagnosis not present

## 2020-07-30 DIAGNOSIS — J441 Chronic obstructive pulmonary disease with (acute) exacerbation: Secondary | ICD-10-CM

## 2020-07-30 DIAGNOSIS — J439 Emphysema, unspecified: Secondary | ICD-10-CM | POA: Diagnosis not present

## 2020-07-30 DIAGNOSIS — J9690 Respiratory failure, unspecified, unspecified whether with hypoxia or hypercapnia: Secondary | ICD-10-CM | POA: Diagnosis not present

## 2020-07-30 DIAGNOSIS — Z7289 Other problems related to lifestyle: Secondary | ICD-10-CM | POA: Diagnosis not present

## 2020-07-30 DIAGNOSIS — R531 Weakness: Secondary | ICD-10-CM | POA: Diagnosis not present

## 2020-07-30 DIAGNOSIS — I480 Paroxysmal atrial fibrillation: Secondary | ICD-10-CM | POA: Diagnosis not present

## 2020-07-30 DIAGNOSIS — J189 Pneumonia, unspecified organism: Secondary | ICD-10-CM | POA: Diagnosis not present

## 2020-07-30 DIAGNOSIS — J948 Other specified pleural conditions: Secondary | ICD-10-CM | POA: Diagnosis not present

## 2020-07-30 DIAGNOSIS — K760 Fatty (change of) liver, not elsewhere classified: Secondary | ICD-10-CM | POA: Diagnosis not present

## 2020-07-30 DIAGNOSIS — J9622 Acute and chronic respiratory failure with hypercapnia: Secondary | ICD-10-CM | POA: Diagnosis not present

## 2020-07-30 DIAGNOSIS — J918 Pleural effusion in other conditions classified elsewhere: Secondary | ICD-10-CM | POA: Diagnosis present

## 2020-07-30 DIAGNOSIS — Z7189 Other specified counseling: Secondary | ICD-10-CM | POA: Diagnosis not present

## 2020-07-30 DIAGNOSIS — J9610 Chronic respiratory failure, unspecified whether with hypoxia or hypercapnia: Secondary | ICD-10-CM | POA: Diagnosis not present

## 2020-07-30 DIAGNOSIS — Z8701 Personal history of pneumonia (recurrent): Secondary | ICD-10-CM | POA: Diagnosis not present

## 2020-07-30 DIAGNOSIS — F1020 Alcohol dependence, uncomplicated: Secondary | ICD-10-CM | POA: Diagnosis present

## 2020-07-30 DIAGNOSIS — J9621 Acute and chronic respiratory failure with hypoxia: Secondary | ICD-10-CM | POA: Diagnosis not present

## 2020-07-30 DIAGNOSIS — E1165 Type 2 diabetes mellitus with hyperglycemia: Secondary | ICD-10-CM | POA: Diagnosis present

## 2020-07-30 DIAGNOSIS — F32A Depression, unspecified: Secondary | ICD-10-CM | POA: Diagnosis present

## 2020-07-30 DIAGNOSIS — E871 Hypo-osmolality and hyponatremia: Secondary | ICD-10-CM | POA: Diagnosis not present

## 2020-07-30 DIAGNOSIS — Z20822 Contact with and (suspected) exposure to covid-19: Secondary | ICD-10-CM | POA: Diagnosis not present

## 2020-07-30 DIAGNOSIS — Z6830 Body mass index (BMI) 30.0-30.9, adult: Secondary | ICD-10-CM | POA: Diagnosis not present

## 2020-07-30 DIAGNOSIS — R0603 Acute respiratory distress: Secondary | ICD-10-CM | POA: Diagnosis not present

## 2020-07-30 DIAGNOSIS — I110 Hypertensive heart disease with heart failure: Secondary | ICD-10-CM | POA: Diagnosis present

## 2020-07-30 DIAGNOSIS — J90 Pleural effusion, not elsewhere classified: Secondary | ICD-10-CM | POA: Diagnosis not present

## 2020-07-30 LAB — CBC
HCT: 38.2 % (ref 36.0–46.0)
Hemoglobin: 12.1 g/dL (ref 12.0–15.0)
MCH: 33.3 pg (ref 26.0–34.0)
MCHC: 31.7 g/dL (ref 30.0–36.0)
MCV: 105.2 fL — ABNORMAL HIGH (ref 80.0–100.0)
Platelets: 92 10*3/uL — ABNORMAL LOW (ref 150–400)
RBC: 3.63 MIL/uL — ABNORMAL LOW (ref 3.87–5.11)
RDW: 12.5 % (ref 11.5–15.5)
WBC: 7.1 10*3/uL (ref 4.0–10.5)
nRBC: 0 % (ref 0.0–0.2)

## 2020-07-30 LAB — URINALYSIS, ROUTINE W REFLEX MICROSCOPIC
Bilirubin Urine: NEGATIVE
Glucose, UA: 50 mg/dL — AB
Hgb urine dipstick: NEGATIVE
Ketones, ur: NEGATIVE mg/dL
Leukocytes,Ua: NEGATIVE
Nitrite: NEGATIVE
Protein, ur: 30 mg/dL — AB
Specific Gravity, Urine: 1.016 (ref 1.005–1.030)
pH: 5 (ref 5.0–8.0)

## 2020-07-30 LAB — RAPID URINE DRUG SCREEN, HOSP PERFORMED
Amphetamines: NOT DETECTED
Barbiturates: NOT DETECTED
Benzodiazepines: POSITIVE — AB
Cocaine: NOT DETECTED
Opiates: NOT DETECTED
Tetrahydrocannabinol: NOT DETECTED

## 2020-07-30 LAB — BASIC METABOLIC PANEL
Anion gap: 9 (ref 5–15)
BUN: 20 mg/dL (ref 8–23)
CO2: 44 mmol/L — ABNORMAL HIGH (ref 22–32)
Calcium: 9.1 mg/dL (ref 8.9–10.3)
Chloride: 80 mmol/L — ABNORMAL LOW (ref 98–111)
Creatinine, Ser: 0.39 mg/dL — ABNORMAL LOW (ref 0.44–1.00)
GFR, Estimated: >60 mL/min (ref >60)
Glucose, Bld: 102 mg/dL — ABNORMAL HIGH (ref 70–99)
Potassium: 4.7 mmol/L (ref 3.5–5.1)
Sodium: 133 mmol/L — ABNORMAL LOW (ref 135–145)

## 2020-07-30 LAB — GLUCOSE, CAPILLARY
Glucose-Capillary: 114 mg/dL — ABNORMAL HIGH (ref 70–99)
Glucose-Capillary: 121 mg/dL — ABNORMAL HIGH (ref 70–99)
Glucose-Capillary: 159 mg/dL — ABNORMAL HIGH (ref 70–99)
Glucose-Capillary: 194 mg/dL — ABNORMAL HIGH (ref 70–99)
Glucose-Capillary: 261 mg/dL — ABNORMAL HIGH (ref 70–99)
Glucose-Capillary: 307 mg/dL — ABNORMAL HIGH (ref 70–99)

## 2020-07-30 LAB — STREP PNEUMONIAE URINARY ANTIGEN: Strep Pneumo Urinary Antigen: NEGATIVE

## 2020-07-30 LAB — TROPONIN I (HIGH SENSITIVITY): Troponin I (High Sensitivity): 6 ng/L (ref <18)

## 2020-07-30 LAB — RESP PANEL BY RT-PCR (FLU A&B, COVID) ARPGX2
Influenza A by PCR: NEGATIVE
Influenza B by PCR: NEGATIVE
SARS Coronavirus 2 by RT PCR: NEGATIVE

## 2020-07-30 LAB — PHOSPHORUS: Phosphorus: 2.4 mg/dL — ABNORMAL LOW (ref 2.5–4.6)

## 2020-07-30 LAB — MAGNESIUM: Magnesium: 1.8 mg/dL (ref 1.7–2.4)

## 2020-07-30 LAB — PROCALCITONIN: Procalcitonin: <0.1 ng/mL

## 2020-07-30 LAB — LACTIC ACID, PLASMA: Lactic Acid, Venous: 2.9 mmol/L (ref 0.5–1.9)

## 2020-07-30 MED ORDER — MELATONIN 3 MG PO TABS
3.0000 mg | ORAL_TABLET | Freq: Once | ORAL | Status: AC
  Administered 2020-07-31: 3 mg via ORAL
  Filled 2020-07-30: qty 1

## 2020-07-30 MED ORDER — CHLORHEXIDINE GLUCONATE CLOTH 2 % EX PADS
6.0000 | MEDICATED_PAD | Freq: Every day | CUTANEOUS | Status: DC
  Administered 2020-07-31 – 2020-08-03 (×4): 6 via TOPICAL

## 2020-07-30 MED ORDER — MOMETASONE FURO-FORMOTEROL FUM 200-5 MCG/ACT IN AERO
2.0000 | INHALATION_SPRAY | Freq: Two times a day (BID) | RESPIRATORY_TRACT | Status: DC
  Administered 2020-07-30: 2 via RESPIRATORY_TRACT
  Filled 2020-07-30: qty 8.8

## 2020-07-30 MED ORDER — METOPROLOL SUCCINATE ER 25 MG PO TB24
25.0000 mg | ORAL_TABLET | Freq: Every day | ORAL | Status: DC
  Administered 2020-07-30 – 2020-07-31 (×2): 25 mg via ORAL
  Filled 2020-07-30: qty 1

## 2020-07-30 MED ORDER — SODIUM CHLORIDE 0.9 % IV SOLN
2.0000 g | Freq: Three times a day (TID) | INTRAVENOUS | Status: DC
  Administered 2020-07-30 – 2020-07-31 (×4): 2 g via INTRAVENOUS
  Filled 2020-07-30 (×4): qty 2

## 2020-07-30 MED ORDER — THIAMINE HCL 100 MG PO TABS
100.0000 mg | ORAL_TABLET | Freq: Every day | ORAL | Status: DC
  Administered 2020-07-30 – 2020-08-06 (×8): 100 mg via ORAL
  Filled 2020-07-30 (×8): qty 1

## 2020-07-30 MED ORDER — HYDRALAZINE HCL 20 MG/ML IJ SOLN
10.0000 mg | Freq: Once | INTRAMUSCULAR | Status: AC
  Administered 2020-07-30: 10 mg via INTRAVENOUS
  Filled 2020-07-30: qty 1

## 2020-07-30 MED ORDER — REVEFENACIN 175 MCG/3ML IN SOLN
175.0000 ug | Freq: Every day | RESPIRATORY_TRACT | Status: DC
  Administered 2020-07-30 – 2020-08-04 (×5): 175 ug via RESPIRATORY_TRACT
  Filled 2020-07-30 (×6): qty 3

## 2020-07-30 MED ORDER — NYSTATIN 100000 UNIT/GM EX POWD
Freq: Three times a day (TID) | CUTANEOUS | Status: DC
  Filled 2020-07-30: qty 15

## 2020-07-30 MED ORDER — FOLIC ACID 1 MG PO TABS
1.0000 mg | ORAL_TABLET | Freq: Every day | ORAL | Status: DC
  Administered 2020-07-30 – 2020-08-06 (×8): 1 mg via ORAL
  Filled 2020-07-30 (×8): qty 1

## 2020-07-30 MED ORDER — INSULIN ASPART 100 UNIT/ML IJ SOLN
0.0000 [IU] | INTRAMUSCULAR | Status: DC
  Administered 2020-07-30: 1 [IU] via SUBCUTANEOUS
  Administered 2020-07-30: 2 [IU] via SUBCUTANEOUS
  Administered 2020-07-30: 7 [IU] via SUBCUTANEOUS
  Administered 2020-07-30 – 2020-07-31 (×2): 5 [IU] via SUBCUTANEOUS
  Administered 2020-07-31 (×3): 2 [IU] via SUBCUTANEOUS
  Administered 2020-07-31 (×2): 3 [IU] via SUBCUTANEOUS
  Administered 2020-07-31: 2 [IU] via SUBCUTANEOUS
  Administered 2020-08-01: 5 [IU] via SUBCUTANEOUS
  Administered 2020-08-01: 2 [IU] via SUBCUTANEOUS
  Administered 2020-08-01: 5 [IU] via SUBCUTANEOUS
  Administered 2020-08-01: 2 [IU] via SUBCUTANEOUS
  Administered 2020-08-01: 1 [IU] via SUBCUTANEOUS
  Administered 2020-08-01: 2 [IU] via SUBCUTANEOUS
  Administered 2020-08-02: 9 [IU] via SUBCUTANEOUS
  Administered 2020-08-02: 2 [IU] via SUBCUTANEOUS

## 2020-07-30 MED ORDER — ENOXAPARIN SODIUM 40 MG/0.4ML IJ SOSY
40.0000 mg | PREFILLED_SYRINGE | INTRAMUSCULAR | Status: DC
  Administered 2020-07-30 – 2020-08-06 (×8): 40 mg via SUBCUTANEOUS
  Filled 2020-07-30 (×8): qty 0.4

## 2020-07-30 MED ORDER — UMECLIDINIUM BROMIDE 62.5 MCG/INH IN AEPB
1.0000 | INHALATION_SPRAY | Freq: Every day | RESPIRATORY_TRACT | Status: DC
  Administered 2020-07-30: 1 via RESPIRATORY_TRACT
  Filled 2020-07-30: qty 7

## 2020-07-30 MED ORDER — PROSOURCE PLUS PO LIQD
30.0000 mL | Freq: Two times a day (BID) | ORAL | Status: DC
  Administered 2020-07-30 – 2020-08-06 (×14): 30 mL via ORAL
  Filled 2020-07-30 (×14): qty 30

## 2020-07-30 MED ORDER — METHYLPREDNISOLONE SODIUM SUCC 125 MG IJ SOLR
60.0000 mg | Freq: Two times a day (BID) | INTRAMUSCULAR | Status: DC
  Administered 2020-07-30 (×2): 60 mg via INTRAVENOUS
  Filled 2020-07-30 (×2): qty 2

## 2020-07-30 MED ORDER — PAROXETINE HCL 20 MG PO TABS
20.0000 mg | ORAL_TABLET | Freq: Every day | ORAL | Status: DC
  Administered 2020-07-30 – 2020-08-06 (×8): 20 mg via ORAL
  Filled 2020-07-30 (×7): qty 1

## 2020-07-30 MED ORDER — ENSURE ENLIVE PO LIQD
237.0000 mL | ORAL | Status: DC
  Administered 2020-07-30 – 2020-08-05 (×7): 237 mL via ORAL

## 2020-07-30 MED ORDER — FLUCONAZOLE 100 MG PO TABS
100.0000 mg | ORAL_TABLET | Freq: Every day | ORAL | Status: DC
  Administered 2020-07-30 – 2020-07-31 (×2): 100 mg via ORAL
  Filled 2020-07-30 (×2): qty 1

## 2020-07-30 MED ORDER — ADULT MULTIVITAMIN W/MINERALS CH
1.0000 | ORAL_TABLET | Freq: Every day | ORAL | Status: DC
  Administered 2020-07-30 – 2020-08-06 (×8): 1 via ORAL
  Filled 2020-07-30 (×8): qty 1

## 2020-07-30 MED ORDER — METOPROLOL TARTRATE 5 MG/5ML IV SOLN
5.0000 mg | INTRAVENOUS | Status: DC | PRN
  Administered 2020-07-30 – 2020-07-31 (×3): 5 mg via INTRAVENOUS
  Filled 2020-07-30 (×3): qty 5

## 2020-07-30 MED ORDER — CHLORHEXIDINE GLUCONATE CLOTH 2 % EX PADS
6.0000 | MEDICATED_PAD | Freq: Every day | CUTANEOUS | Status: DC
  Administered 2020-07-30: 6 via TOPICAL

## 2020-07-30 MED ORDER — ARFORMOTEROL TARTRATE 15 MCG/2ML IN NEBU
15.0000 ug | INHALATION_SOLUTION | Freq: Two times a day (BID) | RESPIRATORY_TRACT | Status: DC
  Administered 2020-07-30 – 2020-07-31 (×3): 15 ug via RESPIRATORY_TRACT
  Filled 2020-07-30 (×2): qty 2

## 2020-07-30 MED ORDER — HYDRALAZINE HCL 20 MG/ML IJ SOLN
10.0000 mg | Freq: Four times a day (QID) | INTRAMUSCULAR | Status: DC | PRN
  Administered 2020-07-30 – 2020-07-31 (×2): 10 mg via INTRAVENOUS
  Filled 2020-07-30 (×2): qty 1

## 2020-07-30 MED ORDER — ORAL CARE MOUTH RINSE
15.0000 mL | Freq: Two times a day (BID) | OROMUCOSAL | Status: DC
  Administered 2020-07-30 – 2020-08-05 (×13): 15 mL via OROMUCOSAL

## 2020-07-30 MED ORDER — THIAMINE HCL 100 MG/ML IJ SOLN
100.0000 mg | Freq: Every day | INTRAMUSCULAR | Status: DC
  Filled 2020-07-30: qty 2

## 2020-07-30 MED ORDER — AMLODIPINE BESYLATE 10 MG PO TABS
10.0000 mg | ORAL_TABLET | Freq: Every day | ORAL | Status: DC
  Administered 2020-07-30: 10 mg via ORAL

## 2020-07-30 MED ORDER — LEVALBUTEROL HCL 0.63 MG/3ML IN NEBU
0.6300 mg | INHALATION_SOLUTION | RESPIRATORY_TRACT | Status: DC | PRN
  Administered 2020-07-30: 0.63 mg via RESPIRATORY_TRACT
  Filled 2020-07-30: qty 3

## 2020-07-30 MED ORDER — CHLORHEXIDINE GLUCONATE 0.12 % MT SOLN
15.0000 mL | Freq: Two times a day (BID) | OROMUCOSAL | Status: DC
  Administered 2020-07-30 – 2020-08-06 (×16): 15 mL via OROMUCOSAL
  Filled 2020-07-30 (×14): qty 15

## 2020-07-30 NOTE — Progress Notes (Addendum)
PROGRESS NOTE    [name]  [id number] DOB: 08-May-1945 DOA: 07/29/2020 PCP: Lujean Amel, MD    Brief Narrative:  [name] is a 75 year old female with past medical history significant for essential hypertension, type 2 diabetes mellitus, chronic diastolic congestive heart failure, paroxysmal atrial fibrillation not on anticoagulation, COPD on 4 L home O2, EtOH/tobacco use disorder who presented to [facility] ED on 7/26 via EMS with lethargy, confusion and shortness of breath.  Apparently, patient's brother called EMS after knowing that she seemed weaker and more lethargic.  Patient denied chest pain, no abdominal pain.  She was recently admitted from 7/19-7/25 with acute on chronic respiratory failure with hypoxia and hypercapnia felt to be secondary to COPD exacerbation and right lower lobe pneumonia.  Patient completed antibiotic course in the hospital in which she required BiPAP initially with IV steroids that were ultimately weaned down and she was discharged home on 4 L nasal cannula.  In the ED, temperature 97.9 F, HR 79, RR 14, BP 172/55, SPO2 88% on BiPAP with FiO2 50%.  VBG with pH 7.2, PCO2 119, PO2 82.2.  Sodium 135, potassium 4.7, chloride 81, CO2 46, glucose 126, BUN 22, creatinine 0.43.  BNP 360.8.  WBC 7.0, hemoglobin 12.3, platelets 94.  COVID-19 PCR negative.  Influenza A/B PCR negative.  CT head without contrast with no acute intracranial abnormality, stable posterior fossa cystic lesion likely arachnoid cyst with minimal sinus disease.  Chest x-ray with stable cardiomegaly with bibasilar atelectasis versus infiltrate, right greater than left with small/moderate sized right pleural effusion.  Patient was placed on BiPAP in the ED, started on empiric antibiotics.  Hospital service consulted for further evaluation management of acute on chronic hypoxic/hypercarbic respiratory failure with concern for continued pneumonia, lethargy and weakness.   Assessment &  Plan:   Principal Problem:   Acute on chronic respiratory failure with hypoxia and hypercapnia (HCC) Active Problems:   Essential hypertension   COPD exacerbation (HCC)   Diabetes mellitus, type II, insulin dependent (HCC)   Alcohol use   Chronic diastolic CHF (congestive heart failure) (HCC)   PAF (paroxysmal atrial fibrillation) (HCC)   Acute on chronic respiratory failure with hypoxia and hypercapnia, POA Patient representing to the ED following recent discharge with weakness, confusion and lethargy.  Patient was found to have hypoxia and hypercapnia.  Patient is afebrile without leukocytosis.  BNP elevated 360.  Influenza A/B and COVID-19 PCR negative.  Chest x-ray concerning for bibasilar infiltrate right greater than left with right pleural effusion.  Suspect etiology multifactorial with CO2 retention, COPD exacerbation and possible continued pneumonia complicated by continued tobacco use disorder. --Continue BiPAP, wean FiO2 as able (baseline oxygen need 4 L per nasal cannula) -- Recurrent admissions, chronic respiratory failure with hypercapnia, likely would benefit from trilogy ventilator vs Bipap qHS on discharge  COPD exacerbation Concern for continued pneumonia, bibasilar Recently discharged for COPD exacerbation on prednisone taper.  Home regimen includes Trelegy Ellipta 1 puff daily. --PCCM following, appreciate assistance --Procalcitonin: Pending --High resolution chest CT: Pending --Respiratory PCR panel: Pending --Brovana 15 mcg neb twice daily --Yuperlri neb 122mg daily --Xopenex neb every 2 hours as needed shortness of breath/wheezing --Cefepime 2 g IV every 8 hours x 5 days --Continue BiPAP twice daily and nocturnally  Paroxysmal atrial fibrillation Not on anticoagulation outpatient. --Metoprolol succinate 25 mg p.o. daily --Continue monitor on telemetry  Chronic diastolic congestive heart failure BNP elevated 360 on admission, chest x-ray with small moderate  right pleural effusion, no  significant pulmonary edema.  Not on diuretics at home. --Continue metoprolol succinate 25 mg p.o. daily --Strict I's and O's and daily weights  Essential hypertension --Restart amlodipine 10 mg p.o. daily, metoprolol succinate 25 mg p.o. daily --Hydralazine 10 mg IV prn SBP >180 or DBP >110  Type 2 diabetes mellitus Hemoglobin A1c 6.4 09/23/2019, well controlled.  Home regimen includes metformin 500 mg p.o. twice daily, Jardiance. --Update hemoglobin A1c --Sensitive SSI for further coverage --CBG before every meal/at bedtime  Depression --Paroxetine 20 mg p.o. daily  EtOH abuse disorder --CIWAA protocol --Folic acid, multivitamin, thiamine  Tobacco use disorder Counseled on need for complete cessation. --Nicotine patch  Intertrigo --Fluconazole 100 mg p.o. daily x7 days, topical nystatin 3 times daily  Acute urinary retention Bladder scan with >96m retained this morning s/p In-N-Out catheterization. --Urinalysis: Pending --Repeat bladder scan 6 hours or at next void; if continues with retention, likely will consider Foley catheter placement until respiratory status stabilizes and more mobile.  Weakness/deconditioning/debility: Patient with recurrent hospitalizations, likely secondary to her chronic respiratory failure with acute exacerbations and suspicion for medication noncompliance.  Was seen by physical therapy and Occupational Therapy with recommendations of SNF placement. --Continue therapy while inpatient --TOC for evaluation and work-up for placement    DVT prophylaxis: enoxaparin (LOVENOX) injection 40 mg Start: 07/30/20 1000   Code Status: Full Code Family Communication: No family present at bedside this morning  Disposition Plan:  Level of care: Stepdown Status is: Inpatient  Remains inpatient appropriate because:Altered mental status, Ongoing diagnostic testing needed not appropriate for outpatient work up, Unsafe d/c plan, IV  treatments appropriate due to intensity of illness or inability to take PO, and Inpatient level of care appropriate due to severity of illness  Dispo: The patient is from: Home              Anticipated d/c is to: SNF              Patient currently is not medically stable to d/c.   Difficult to place patient No  Consultants:  PCCM  Procedures:  BiPAP  Antimicrobials:  Cefepime 7/27>> Fluconazole 7/27>>    Subjective: Patient seen examined bedside, continues on BiPAP with increased work of breathing.  Nursing reports bladder scan with greater than 999 mL reported received in and out catheterization.  Patient's mental status seems to be improved with improvement of her CO2 level on BiPAP.  No other questions or concerns at this time.  No family present this morning at bedside.  Patient denies headache, no chest pain, no palpitations, no abdominal pain.  No acute events overnight per nursing staff.  Objective: Vitals:   07/30/20 0635 07/30/20 0700 07/30/20 0800 07/30/20 0900  BP: (!) 206/53 (!) 181/48 (!) 182/45 (!) 177/46  Pulse:  67 68 87  Resp:  (!) 22 (!) 26 (!) 27  Temp:   97.6 F (36.4 C)   TempSrc:   Axillary   SpO2:  94% 90% (!) 87%  Weight:      Height:        Intake/Output Summary (Last 24 hours) at 07/30/2020 1351 Last data filed at 07/30/2020 0817 Gross per 24 hour  Intake 97.72 ml  Output 1450 ml  Net -1352.28 ml   Filed Weights   07/29/20 1720 07/30/20 0201  Weight: 87 kg 90.3 kg    Examination:  General exam: Mild respiratory distress on BiPAP, chronically ill in appearance Respiratory system: Coarse breath sounds bilaterally, slightly decreased bilateral bases, slightly increased  respiratory effort, on BiPAP with 50% FiO2 Cardiovascular system: S1 & S2 heard, RRR. No JVD, murmurs, rubs, gallops or clicks. No pedal edema. Gastrointestinal system: Abdomen is nondistended, soft and nontender. No organomegaly or masses felt. Normal bowel sounds  heard. Central nervous system: Alert and oriented. No focal neurological deficits. Extremities: Symmetric 5 x 5 power. Skin: Lacy rash noted groin, otherwise no rashes, lesions or ulcers Psychiatry: Judgement and insight appear poor. Mood & affect appropriate.     Data Reviewed: I have personally reviewed following labs and imaging studies  CBC: Recent Labs  Lab 07/24/20 0233 07/29/20 1947 07/30/20 0653  WBC 6.9 7.0 7.1  NEUTROABS  --  5.6  --   HGB 12.0 12.3 12.1  HCT 36.8 39.7 38.2  MCV 102.5* 107.3* 105.2*  PLT 137* 94* 92*   Basic Metabolic Panel: Recent Labs  Lab 07/24/20 0233 07/24/20 1134 07/27/20 0806 07/27/20 1521 07/28/20 0429 07/29/20 1947 07/30/20 0500 07/30/20 0653  NA 123*   < > 126* 130* 129* 135  --  133*  K 5.5*   < > 5.3* 5.4* 4.5 4.7  --  4.7  CL 75*   < > 79* 84* 84* 81*  --  80*  CO2 39*   < > 39* 39* 38* 46*  --  44*  GLUCOSE 166*   < > 170* 235* 117* 126*  --  102*  BUN 33*   < > 44* 44* 36* 22  --  20  CREATININE 0.63   < > 0.69 0.78 0.64 0.43*  --  0.39*  CALCIUM 8.9   < > 8.7* 8.8* 8.6* 9.2  --  9.1  MG 2.0  --   --   --   --   --  1.8  --   PHOS 4.2  --   --   --   --   --  2.4*  --    < > = values in this interval not displayed.   GFR: Estimated Creatinine Clearance: 72.6 mL/min (A) (by C-G formula based on SCr of 0.39 mg/dL (L)). Liver Function Tests: Recent Labs  Lab 07/24/20 0233 07/29/20 1947  AST 23 21  ALT 18 24  ALKPHOS 67 58  BILITOT 0.4 0.6  PROT 6.3* 6.0*  ALBUMIN 3.6 3.4*   No results for input(s): LIPASE, AMYLASE in the last 168 hours. No results for input(s): AMMONIA in the last 168 hours. Coagulation Profile: No results for input(s): INR, PROTIME in the last 168 hours. Cardiac Enzymes: No results for input(s): CKTOTAL, CKMB, CKMBINDEX, TROPONINI in the last 168 hours. BNP (last 3 results) No results for input(s): PROBNP in the last 8760 hours. HbA1C: No results for input(s): HGBA1C in the last 72  hours. CBG: Recent Labs  Lab 07/28/20 0744 07/28/20 1116 07/30/20 0404 07/30/20 0734 07/30/20 1219  GLUCAP 102* 146* 114* 121* 261*   Lipid Profile: No results for input(s): CHOL, HDL, LDLCALC, TRIG, CHOLHDL, LDLDIRECT in the last 72 hours. Thyroid Function Tests: No results for input(s): TSH, T4TOTAL, FREET4, T3FREE, THYROIDAB in the last 72 hours. Anemia Panel: No results for input(s): VITAMINB12, FOLATE, FERRITIN, TIBC, IRON, RETICCTPCT in the last 72 hours. Sepsis Labs: Recent Labs  Lab 07/30/20 1049  LATICACIDVEN 2.9*    Recent Results (from the past 240 hour(s))  Resp Panel by RT-PCR (Flu A&B, Covid) Nasopharyngeal Swab     Status: None   Collection Time: 07/22/20  9:25 AM   Specimen: Nasopharyngeal Swab; Nasopharyngeal(NP) swabs in vial  transport medium  Result Value Ref Range Status   SARS Coronavirus 2 by RT PCR NEGATIVE NEGATIVE Final    Comment: (NOTE) SARS-CoV-2 target nucleic acids are NOT DETECTED.  The SARS-CoV-2 RNA is generally detectable in upper respiratory specimens during the acute phase of infection. The lowest concentration of SARS-CoV-2 viral copies this assay can detect is 138 copies/mL. A negative result does not preclude SARS-Cov-2 infection and should not be used as the sole basis for treatment or other patient management decisions. A negative result may occur with  improper specimen collection/handling, submission of specimen other than nasopharyngeal swab, presence of viral mutation(s) within the areas targeted by this assay, and inadequate number of viral copies(<138 copies/mL). A negative result must be combined with clinical observations, patient history, and epidemiological information. The expected result is Negative.  Fact Sheet for Patients:  EntrepreneurPulse.com.au  Fact Sheet for Healthcare Providers:  IncredibleEmployment.be  This test is no t yet approved or cleared by the [place]  FDA and  has been authorized for detection and/or diagnosis of SARS-CoV-2 by FDA under an Emergency Use Authorization (EUA). This EUA will remain  in effect (meaning this test can be used) for the duration of the COVID-19 declaration under Section 564(b)(1) of the Act, 21 U.S.C.section 360bbb-3(b)(1), unless the authorization is terminated  or revoked sooner.       Influenza A by PCR NEGATIVE NEGATIVE Final   Influenza B by PCR NEGATIVE NEGATIVE Final    Comment: (NOTE) The Xpert Xpress SARS-CoV-2/FLU/RSV plus assay is intended as an aid in the diagnosis of influenza from Nasopharyngeal swab specimens and should not be used as a sole basis for treatment. Nasal washings and aspirates are unacceptable for Xpert Xpress SARS-CoV-2/FLU/RSV testing.  Fact Sheet for Patients: EntrepreneurPulse.com.au  Fact Sheet for Healthcare Providers: IncredibleEmployment.be  This test is not yet approved or cleared by the [place] FDA and has been authorized for detection and/or diagnosis of SARS-CoV-2 by FDA under an Emergency Use Authorization (EUA). This EUA will remain in effect (meaning this test can be used) for the duration of the COVID-19 declaration under Section 564(b)(1) of the Act, 21 U.S.C. section 360bbb-3(b)(1), unless the authorization is terminated or revoked.  Performed at [facility], [place] [address] [place], [place] [address]   Culture, blood (routine x 2)     Status: Abnormal   Collection Time: 07/22/20  9:50 AM   Specimen: BLOOD  Result Value Ref Range Status   Specimen Description   Final    BLOOD LEFT ANTECUBITAL Performed at [facility] [address], [place], [place] [address]    Special Requests   Final    BOTTLES DRAWN AEROBIC AND ANAEROBIC Blood Culture results may not be optimal due to an inadequate volume of blood received in culture bottles Performed at [facility] [address] [place], [place] [address]    Culture  Setup Time   Final    GRAM POSITIVE COCCI IN BOTH AEROBIC AND ANAEROBIC BOTTLES CRITICAL RESULT CALLED TO, READ BACK BY AND VERIFIED WITH: PHARMD T GREEN [id number] AT 33 AM BY CM    Culture (A)  Final    STAPHYLOCOCCUS EPIDERMIDIS STAPHYLOCOCCUS HOMINIS THE SIGNIFICANCE OF ISOLATING THIS ORGANISM FROM A SINGLE SET OF BLOOD CULTURES WHEN MULTIPLE SETS ARE DRAWN IS UNCERTAIN. PLEASE NOTIFY THE MICROBIOLOGY DEPARTMENT WITHIN ONE WEEK IF SPECIATION AND SENSITIVITIES ARE REQUIRED. Performed at [facility], [place] [address], [place], [place] [address]  Report Status 07/26/2020 FINAL  Final  Blood Culture ID Panel (Reflexed)     Status: Abnormal   Collection Time: 07/22/20  9:50 AM  Result Value Ref Range Status   Enterococcus faecalis NOT DETECTED NOT DETECTED Final   Enterococcus Faecium NOT DETECTED NOT DETECTED Final   Listeria monocytogenes NOT DETECTED NOT DETECTED Final   Staphylococcus species DETECTED (A) NOT DETECTED Final    Comment: CRITICAL RESULT CALLED TO, READ BACK BY AND VERIFIED WITH: PHARMD T GREEN [id number] AT 721 AM BY CM    Staphylococcus aureus (BCID) NOT DETECTED NOT DETECTED Final   Staphylococcus epidermidis DETECTED (A) NOT DETECTED Final    Comment: Methicillin (oxacillin) resistant coagulase negative staphylococcus. Possible blood culture contaminant (unless isolated from more than one blood culture draw or clinical case suggests pathogenicity). No antibiotic treatment is indicated for blood  culture contaminants. CRITICAL RESULT CALLED TO, READ BACK BY AND VERIFIED WITH: PHARMD T GREEN [id number] AT 721 AM BY CM    Staphylococcus lugdunensis NOT DETECTED NOT DETECTED Final   Streptococcus species NOT DETECTED NOT DETECTED Final   Streptococcus agalactiae NOT DETECTED NOT DETECTED Final   Streptococcus pneumoniae NOT DETECTED NOT DETECTED Final   Streptococcus pyogenes NOT DETECTED NOT DETECTED  Final   A.calcoaceticus-baumannii NOT DETECTED NOT DETECTED Final   Bacteroides fragilis NOT DETECTED NOT DETECTED Final   Enterobacterales NOT DETECTED NOT DETECTED Final   Enterobacter cloacae complex NOT DETECTED NOT DETECTED Final   Escherichia coli NOT DETECTED NOT DETECTED Final   Klebsiella aerogenes NOT DETECTED NOT DETECTED Final   Klebsiella oxytoca NOT DETECTED NOT DETECTED Final   Klebsiella pneumoniae NOT DETECTED NOT DETECTED Final   Proteus species NOT DETECTED NOT DETECTED Final   Salmonella species NOT DETECTED NOT DETECTED Final   Serratia marcescens NOT DETECTED NOT DETECTED Final   Haemophilus influenzae NOT DETECTED NOT DETECTED Final   Neisseria meningitidis NOT DETECTED NOT DETECTED Final   Pseudomonas aeruginosa NOT DETECTED NOT DETECTED Final   Stenotrophomonas maltophilia NOT DETECTED NOT DETECTED Final   Candida albicans NOT DETECTED NOT DETECTED Final   Candida auris NOT DETECTED NOT DETECTED Final   Candida glabrata NOT DETECTED NOT DETECTED Final   Candida krusei NOT DETECTED NOT DETECTED Final   Candida parapsilosis NOT DETECTED NOT DETECTED Final   Candida tropicalis NOT DETECTED NOT DETECTED Final   Cryptococcus neoformans/gattii NOT DETECTED NOT DETECTED Final   Methicillin resistance mecA/C DETECTED (A) NOT DETECTED Final    Comment: CRITICAL RESULT CALLED TO, READ BACK BY AND VERIFIED WITH: T GREEN [id number] AT 721 AM BY CM Performed at [facility], [address], [place], [place] [address]   Culture, blood (routine x 2)     Status: None   Collection Time: 07/22/20 11:05 AM   Specimen: BLOOD  Result Value Ref Range Status   Specimen Description   Final    BLOOD RIGHT ANTECUBITAL Performed at [facility] [address] [place], [place] [address]    Special Requests   Final    BOTTLES DRAWN AEROBIC AND ANAEROBIC Blood Culture adequate volume Performed at [facility] [address]  [place], [place] [address]    Culture   Final    NO GROWTH 5 DAYS Performed at [facility], [place] [address] [place], [place] [address]    Report Status 07/27/2020 FINAL  Final  MRSA Next Gen by PCR, Nasal     Status: Abnormal   Collection Time:  07/22/20  9:00 PM   Specimen: Nasal Mucosa; Nasal Swab  Result Value Ref Range Status   MRSA by PCR Next Gen DETECTED (A) NOT DETECTED Final    Comment: RESULT CALLED TO, READ BACK BY AND VERIFIED WITH: DANIELLE, RN @ 0025 ON 07/23/20 C VARNER (NOTE) The GeneXpert MRSA Assay (FDA approved for NASAL specimens only), is one component of a comprehensive MRSA colonization surveillance program. It is not intended to diagnose MRSA infection nor to guide or monitor treatment for MRSA infections. Test performance is not FDA approved in patients less than 52 years old. Performed at [facility], [place] [address], [place], [place] [address]   Resp Panel by RT-PCR (Flu A&B, Covid) Nasopharyngeal Swab     Status: None   Collection Time: 07/29/20  8:23 PM   Specimen: Nasopharyngeal Swab; Nasopharyngeal(NP) swabs in vial transport medium  Result Value Ref Range Status   SARS Coronavirus 2 by RT PCR NEGATIVE NEGATIVE Final    Comment: (NOTE) SARS-CoV-2 target nucleic acids are NOT DETECTED.  The SARS-CoV-2 RNA is generally detectable in upper respiratory specimens during the acute phase of infection. The lowest concentration of SARS-CoV-2 viral copies this assay can detect is 138 copies/mL. A negative result does not preclude SARS-Cov-2 infection and should not be used as the sole basis for treatment or other patient management decisions. A negative result may occur with  improper specimen collection/handling, submission of specimen other than nasopharyngeal swab, presence of viral mutation(s) within the areas targeted by this assay, and inadequate number of viral copies(<138 copies/mL). A negative result must be combined  with clinical observations, patient history, and epidemiological information. The expected result is Negative.  Fact Sheet for Patients:  EntrepreneurPulse.com.au  Fact Sheet for Healthcare Providers:  IncredibleEmployment.be  This test is no t yet approved or cleared by the [place] FDA and  has been authorized for detection and/or diagnosis of SARS-CoV-2 by FDA under an Emergency Use Authorization (EUA). This EUA will remain  in effect (meaning this test can be used) for the duration of the COVID-19 declaration under Section 564(b)(1) of the Act, 21 U.S.C.section 360bbb-3(b)(1), unless the authorization is terminated  or revoked sooner.       Influenza A by PCR NEGATIVE NEGATIVE Final   Influenza B by PCR NEGATIVE NEGATIVE Final    Comment: (NOTE) The Xpert Xpress SARS-CoV-2/FLU/RSV plus assay is intended as an aid in the diagnosis of influenza from Nasopharyngeal swab specimens and should not be used as a sole basis for treatment. Nasal washings and aspirates are unacceptable for Xpert Xpress SARS-CoV-2/FLU/RSV testing.  Fact Sheet for Patients: EntrepreneurPulse.com.au  Fact Sheet for Healthcare Providers: IncredibleEmployment.be  This test is not yet approved or cleared by the [place] FDA and has been authorized for detection and/or diagnosis of SARS-CoV-2 by FDA under an Emergency Use Authorization (EUA). This EUA will remain in effect (meaning this test can be used) for the duration of the COVID-19 declaration under Section 564(b)(1) of the Act, 21 U.S.C. section 360bbb-3(b)(1), unless the authorization is terminated or revoked.  Performed at [facility], [place] [address], [place], [place] [address]          Radiology Studies: CT Head Wo Contrast  Result Date: 07/29/2020 CLINICAL DATA:  Mental status changes of unknown cause, lethargy. 75 year old female.  EXAM: CT HEAD WITHOUT CONTRAST TECHNIQUE: Contiguous axial images were obtained from the base of the skull through the vertex without intravenous contrast. COMPARISON:  September 22, 2019.  FINDINGS: Brain: No evidence of acute infarction, hemorrhage, hydrocephalus, extra-axial collection or mass lesion/mass effect. Mild atrophy and stable cystic lesion in the posterior fossa biased towards the RIGHT behind the cerebellum. Vascular: No hyperdense vessel or unexpected calcification. Skull: Normal. Negative for fracture or focal lesion. Sinuses/Orbits: Visualized paranasal sinuses and orbits with minimal LEFT maxillary and RIGHT sphenoid sinus disease. No air-fluid level. Orbits are unremarkable. Other: None IMPRESSION: 1. No acute intracranial pathology. 2. Stable posterior fossa cystic lesion, likely a arachnoid cyst. 3. Minimal sinus disease. Electronically Signed   By: Zetta Bills M.D.   On: 07/29/2020 19:41   DG Chest Port 1 View  Result Date: 07/29/2020 CLINICAL DATA:  Altered mental status. EXAM: PORTABLE CHEST 1 VIEW COMPARISON:  July 22, 2020 FINDINGS: Mild, diffusely increased lung markings are seen. Moderate to marked severity right basilar atelectasis and/or infiltrate is seen. This is very mildly decreased in severity when compared to the prior study. Mild left basilar atelectasis and/or infiltrate is also noted. There is a small to moderate size right pleural effusion. Mild to moderate severity enlargement of the cardiac silhouette is seen. There is diffuse calcification of the thoracic aorta. The visualized skeletal structures are unremarkable. IMPRESSION: 1. Stable cardiomegaly with bibasilar atelectasis and/or infiltrate, right greater than left. 2. Small to moderate sized right pleural effusion. Electronically Signed   By: Virgina Norfolk M.D.   On: 07/29/2020 19:53        Scheduled Meds:  arformoterol  15 mcg Nebulization BID   chlorhexidine  15 mL Mouth Rinse BID   Chlorhexidine  Gluconate Cloth  6 each Topical Daily   enoxaparin (LOVENOX) injection  40 mg Subcutaneous Q24H   fluconazole  100 mg Oral Daily   folic acid  1 mg Oral Daily   insulin aspart  0-9 Units Subcutaneous Q4H   mouth rinse  15 mL Mouth Rinse q12n4p   methylPREDNISolone (SOLU-MEDROL) injection  60 mg Intravenous Q12H   multivitamin with minerals  1 tablet Oral Daily   nystatin   Topical TID   revefenacin  175 mcg Nebulization Daily   thiamine  100 mg Oral Daily   Or   thiamine  100 mg Intravenous Daily   Continuous Infusions:  ceFEPime (MAXIPIME) IV Stopped (07/30/20 0533)     LOS: 0 days    Critical Care Time Upon my evaluation, this patient had a high probability of imminent or life-threatening deterioration due to acute on chronic hypoxic respiratory failure requiring BiPAP with COPD exacerbation and pneumonia, which required my direct attention, intervention, and personal management.  I have personally provided 42 minutes of critical care time exclusive of my time spent on separately billable procedures.  Time includes review of laboratory data, radiology results, discussion with consultants, and monitoring for potential decompensation.       Rebekah Zackery J [place] [place] DO [facility] Available via Epic secure chat 7am-7pm After these hours, please refer to coverage provider listed on [email or web address] 07/30/2020, 1:51 PM

## 2020-07-30 NOTE — Progress Notes (Signed)
Pt refused bath at this time. Pt states that she does not want a bath until tomorrow afternoon. CHG pads order modified to be scheduled on day shift per patient request.

## 2020-07-30 NOTE — Consult Note (Signed)
NAME:  [name], MRN:  [id number], DOB:  24-Apr-1945, LOS: 0 ADMISSION DATE:  07/29/2020, CONSULTATION DATE:  7/27?22 REFERRING MD:  Dr Eric [place] [facility], CHIEF COMPLAINT:  resp failure  PCP Koirala, [place], MD   History of Present Illness:   75 yo F with COPD NOs (never had PFTs, beeon on o2 for years NOS), ongoing smoking, dCHF, 4L baseline O2 requirement.  AT baseline lives alone with dog. Uses walker for mobility. Unable to navigate 3 steps without help. Never seen a pulmonary MD. Never had PFt. Last CT chest 2018.  Chart reports daily etoh as well. No known drug use or benzo or opioid use.  Baseline functional status appears to be ECOG 3-4   Pt [facility]  [facility]19-7[facility]25 with acute on chronic hypoxia and hypercapnea. With concern for RLL CAP NOS. Readmitted via ED 07/29/20 due to increased weakness and lethargy. Extreme resp acidosis in ER with pH 7.22 pCO2 of 119.  bicarb 46.  Salvaged with BIPAP and mental status and ABG improved. CCM consulted mornign of 07/30/20 for acute on chronic resp failure and recurrent admissions.  On mormning of 07/30/20 watching TV and oriented but seems to suffer from poor motivation and giving poor history. She is unclear how long she has been on o2 and using walker . REfers to her brother her caretaker for answerrs to many questions  At admit wbc normal, covid pcr/flu pcr neg, CXR with mild pleural effusion. BNO 360 (recently 237 in July). Per RN - no active wheezing (was wheezing in th ER)   Pertinent  Medical History   No flowsheet data found.   has a past medical history of Acute exacerbation of CHF (congestive heart failure) ([place]) (09/22/2019), COPD (chronic obstructive pulmonary disease) ([place]), Diabetes mellitus, Diastolic dysfunction, Hyperlipidemia, and Hypertension.   reports that she has been smoking cigarettes. She has a 52.00 pack-year smoking history. She has never used smokeless tobacco.  Past Surgical History:  Procedure Laterality  Date   TUBAL LIGATION      No Known Allergies  Immunization History  Administered Date(s) Administered   Influenza Split 12/02/2010   PPD Test 04/23/2016   Pneumococcal Polysaccharide-23 02/03/2018    Family History  Problem Relation Age of Onset   Heart disease Father    Lung cancer Father      Current Facility-Administered Medications:    ceFEPIme (MAXIPIME) 2 g in sodium chloride 0.9 % 100 mL IVPB, 2 g, Intravenous, Q8H, Gardner, Jared M, DO, Stopped at 07/30/20 0533   chlorhexidine (PERIDEX) 0.12 % solution 15 mL, 15 mL, Mouth Rinse, BID, Alcario Drought, Jared M, DO, 15 mL at 07/30/20 0913   Chlorhexidine Gluconate Cloth 2 % PADS 6 each, 6 each, Topical, Daily, Etta Quill, DO, 6 each at 07/30/20 0151   enoxaparin (LOVENOX) injection 40 mg, 40 mg, Subcutaneous, Q24H, Alcario Drought, Jared M, DO, 40 mg at 07/30/20 0913   fluconazole (DIFLUCAN) tablet 100 mg, 100 mg, Oral, Daily, [place] Donnamarie Poag, DO, 100 mg at [id number]   folic acid (FOLVITE) tablet 1 mg, 1 mg, Oral, Daily, Alcario Drought, Jared M, DO, 1 mg at 07/30/20 0910   hydrALAZINE (APRESOLINE) injection 10 mg, 10 mg, Intravenous, Q6H PRN, [place] Eric J, DO   insulin aspart (novoLOG) injection 0-9 Units, 0-9 Units, Subcutaneous, Q4H, Gardner, Jared M, DO, 1 Units at 07/30/20 0829   levalbuterol (XOPENEX) nebulizer solution 0.63 mg, 0.63 mg, Nebulization, Q2H PRN, Etta Quill, DO, 0.63 mg at 07/30/20 [id number]  MEDLINE mouth rinse, 15 mL, Mouth Rinse, q12n4p, Alcario Drought, Jared M, DO   methylPREDNISolone sodium succinate (SOLU-MEDROL) 125 mg/2 mL injection 60 mg, 60 mg, Intravenous, Q12H, Alcario Drought, Jared M, DO, 60 mg at 07/30/20 [id number]   metoprolol tartrate (LOPRESSOR) injection 5 mg, 5 mg, Intravenous, Q5 min PRN, Etta Quill, DO, 5 mg at 07/30/20 0117   mometasone-formoterol (DULERA) 200-5 MCG/ACT inhaler 2 puff, 2 puff, Inhalation, BID, Etta Quill, DO, 2 puff at 07/30/20 0820   multivitamin with minerals tablet 1 tablet, 1  tablet, Oral, Daily, Alcario Drought, Jared M, DO, 1 tablet at 07/30/20 0910   nystatin (MYCOSTATIN/NYSTOP) topical powder, , Topical, TID, [place] Donnamarie Poag, DO   thiamine tablet 100 mg, 100 mg, Oral, Daily, 100 mg at 07/30/20 0910 **OR** thiamine (B-1) injection 100 mg, 100 mg, Intravenous, Daily, Alcario Drought, Jared M, DO   umeclidinium bromide (INCRUSE ELLIPTA) 62.5 MCG/INH 1 puff, 1 puff, Inhalation, Daily, Alcario Drought, Jared M, DO, 1 puff at 07/30/20 [facility] Events: Including procedures, antibiotic start and stop dates in addition to other pertinent events   07/29/2020 - admit 7/27 - ccm consult  Interim History / Subjective:  7/27 - ccm consult 1227 [place] icu  Objective   Blood pressure (!) 182/45, pulse 68, temperature 97.6 F (36.4 C), temperature source Axillary, resp. rate (!) 26, height 5' 7.99" (1.727 m), weight 90.3 kg, SpO2 90 %.    FiO2 (%):  [50 %] 50 %   Intake/Output Summary (Last 24 hours) at 07/30/2020 0920 Last data filed at 07/30/2020 0817 Gross per 24 hour  Intake 97.72 ml  Output 1450 ml  Net -1352.28 ml   Filed Weights   07/29/20 1720 07/30/20 0201  Weight: 87 kg 90.3 kg    Examination: General: disheveled. Faint odor of no shower x  days.  HENT: No neck nodes, No jov Lungs: CTA bialteraly. Distant breath sounds Cardiovascular: RRR. No murmurs Abdomen: soft Extremities: no cyansis, no clubbing, no edema Neuro: AxOx3. MILD ASTERIXIS + GU: Not examined    LABS  Results for [name] (MRN [id number]) as of 07/30/2020 09:21  Ref. Range 07/29/2020 19:47 07/29/2020 20:23 07/29/2020 23:24  pH, Ven Latest Ref Range: 7.250 - 7.430  7.222 (L)  7.343  pCO2, Ven Latest Ref Range: 44.0 - 60.0 mmHg 119 (HH)  78.7 (HH)  pO2, Ven Latest Ref Range: 32.0 - 45.0 mmHg 82.2 (H)  57.0 (H)    PULMONARY Recent Labs  Lab 07/29/20 1947 07/29/20 2324  HCO3 46.9* 41.6*  O2SAT 94.9 88.3    CBC Recent Labs  Lab 07/24/20 0233 07/29/20 1947  07/30/20 0653  HGB 12.0 12.3 12.1  HCT 36.8 39.7 38.2  WBC 6.9 7.0 7.1  PLT 137* 94* 92*    COAGULATION No results for input(s): INR in the last 168 hours.  CARDIAC  No results for input(s): TROPONINI in the last 168 hours. No results for input(s): PROBNP in the last 168 hours.   CHEMISTRY Recent Labs  Lab 07/24/20 0233 07/24/20 1134 07/27/20 0806 07/27/20 1521 07/28/20 0429 07/29/20 1947 07/30/20 0653  NA 123*   < > 126* 130* 129* 135 133*  K 5.5*   < > 5.3* 5.4* 4.5 4.7 4.7  CL 75*   < > 79* 84* 84* 81* 80*  CO2 39*   < > 39* 39* 38* 46* 44*  GLUCOSE 166*   < > 170* 235* 117* 126* 102*  BUN 33*   < > 44* 44* 36*  22 20  CREATININE 0.63   < > 0.69 0.78 0.64 0.43* 0.39*  CALCIUM 8.9   < > 8.7* 8.8* 8.6* 9.2 9.1  MG 2.0  --   --   --   --   --   --   PHOS 4.2  --   --   --   --   --   --    < > = values in this interval not displayed.   Estimated Creatinine Clearance: 72.6 mL/min (A) (by C-G formula based on SCr of 0.39 mg/dL (L)).   LIVER Recent Labs  Lab 07/24/20 0233 07/29/20 1947  AST 23 21  ALT 18 24  ALKPHOS 67 58  BILITOT 0.4 0.6  PROT 6.3* 6.0*  ALBUMIN 3.6 3.4*     INFECTIOUS No results for input(s): LATICACIDVEN, PROCALCITON in the last 168 hours.   ENDOCRINE CBG (last 3)  Recent Labs    07/28/20 1116 07/30/20 0404 07/30/20 0734  GLUCAP 146* 114* 121*         IMAGING x48h  - image(s) personally visualized  -   highlighted in bold CT Head Wo Contrast  Result Date: 07/29/2020 CLINICAL DATA:  Mental status changes of unknown cause, lethargy. 75 year old female. EXAM: CT HEAD WITHOUT CONTRAST TECHNIQUE: Contiguous axial images were obtained from the base of the skull through the vertex without intravenous contrast. COMPARISON:  September 22, 2019. FINDINGS: Brain: No evidence of acute infarction, hemorrhage, hydrocephalus, extra-axial collection or mass lesion/mass effect. Mild atrophy and stable cystic lesion in the posterior fossa  biased towards the RIGHT behind the cerebellum. Vascular: No hyperdense vessel or unexpected calcification. Skull: Normal. Negative for fracture or focal lesion. Sinuses/Orbits: Visualized paranasal sinuses and orbits with minimal LEFT maxillary and RIGHT sphenoid sinus disease. No air-fluid level. Orbits are unremarkable. Other: None IMPRESSION: 1. No acute intracranial pathology. 2. Stable posterior fossa cystic lesion, likely a arachnoid cyst. 3. Minimal sinus disease. Electronically Signed   By: Zetta Bills M.D.   On: 07/29/2020 19:41   DG Chest Port 1 View  Result Date: 07/29/2020 CLINICAL DATA:  Altered mental status. EXAM: PORTABLE CHEST 1 VIEW COMPARISON:  July 22, 2020 FINDINGS: Mild, diffusely increased lung markings are seen. Moderate to marked severity right basilar atelectasis and/or infiltrate is seen. This is very mildly decreased in severity when compared to the prior study. Mild left basilar atelectasis and/or infiltrate is also noted. There is a small to moderate size right pleural effusion. Mild to moderate severity enlargement of the cardiac silhouette is seen. There is diffuse calcification of the thoracic aorta. The visualized skeletal structures are unremarkable. IMPRESSION: 1. Stable cardiomegaly with bibasilar atelectasis and/or infiltrate, right greater than left. 2. Small to moderate sized right pleural effusion. Electronically Signed   By: Virgina Norfolk M.D.   On: 07/29/2020 19:53     Resolved Hospital Problem list   x  Assessment & Plan:  Baseline prior to and at the time of admission  -Alcoholism -Likely self-neglect -Poor functional status ECOG 3-4 -Immobility -Severe physical deconditioning -Heavy tobacco abuse -Chronic hypoxemic and hypercapnic respiratory failure without pulmonary support as outpatient -Baseline diastolic dysfunction grade 1   Current admission Acute on chronic hypoxemic and hypercapnic respiratory failure  -likely due to rec and COPD  exacerbation and probably medication noncompliance after discharge and ongoing smoking  -Need to rule out infectious etiologies for exacerbation, concomitant interstitial lung disease, MI  Plan - High-resolution CT chest without contrast -Check troponin -Check procalcitonin, urine strep, urine  Legionella and pan respiratory virus panel -Change bronchodilatation to nebulizer -long-acting anticholinergic once daily and long-acting beta agonist twice daily -IV steroids to continue -BiPAP nightly and 2 applications during the daytim;  likely needs nocturnal ventilation at home at night at the time of discharge -Recommend PT/OT consult; might need SNF rehab at discharge -Monitor for alcohol withdrawal according to the hospitalist -Needs outpatient pulmonary function test, alpha-1 antitrypsin phenotype check and also outpatient pulmonologist  Best Practice (right click and "Reselect all SmartList Selections" daily)   According to the hospitalist      ATTESTATION & SIGNATURE   The patient [name] is critically ill with multiple organ systems failure and requires high complexity decision making for assessment and support, frequent evaluation and titration of therapies, application of advanced monitoring technologies and extensive interpretation of multiple databases.   Critical Care Time devoted to patient care services described in this note is  45  Minutes. This time reflects time of care of this signee Dr Brand Males. This critical care time does not reflect procedure time, or teaching time or supervisory time of PA/NP/Med student/Med Resident etc but could involve care discussion time     Dr. Brand Males, M.D., [facility].C.P Pulmonary and Critical Care Medicine Staff Physician [facility] Pager: [phone], If no answer or between  15:00h - 7:00h: call [phone]  07/30/2020 9:20 AM

## 2020-07-30 NOTE — Progress Notes (Signed)
Pt placed back on BIPAP for SOB.

## 2020-07-30 NOTE — Plan of Care (Signed)
Pt is now A/Ox4 and responds to commands. Pt on BiPaP at 65% Problem: Education: Goal: Knowledge of General Education information will improve Description: Including pain rating scale, medication(s)/side effects and non-pharmacologic comfort measures Outcome: Progressing   Problem: Health Behavior/Discharge Planning: Goal: Ability to manage health-related needs will improve Outcome: Progressing   Problem: Clinical Measurements: Goal: Ability to maintain clinical measurements within normal limits will improve Outcome: Progressing Goal: Will remain free from infection Outcome: Progressing Goal: Diagnostic test results will improve Outcome: Progressing Goal: Respiratory complications will improve Outcome: Progressing Goal: Cardiovascular complication will be avoided Outcome: Progressing   Problem: Activity: Goal: Risk for activity intolerance will decrease Outcome: Progressing   Problem: Nutrition: Goal: Adequate nutrition will be maintained Outcome: Progressing   Problem: Coping: Goal: Level of anxiety will decrease Outcome: Progressing   Problem: Elimination: Goal: Will not experience complications related to bowel motility Outcome: Progressing Goal: Will not experience complications related to urinary retention Outcome: Progressing   Problem: Pain Managment: Goal: General experience of comfort will improve Outcome: Progressing   Problem: Safety: Goal: Ability to remain free from injury will improve Outcome: Progressing   Problem: Skin Integrity: Goal: Risk for impaired skin integrity will decrease Outcome: Progressing   Problem: Education: Goal: Knowledge of disease or condition will improve Outcome: Progressing Goal: Knowledge of the prescribed therapeutic regimen will improve Outcome: Progressing Goal: Individualized Educational Video(s) Outcome: Progressing   Problem: Activity: Goal: Ability to tolerate increased activity will improve Outcome:  Progressing Goal: Will verbalize the importance of balancing activity with adequate rest periods Outcome: Progressing   Problem: Respiratory: Goal: Ability to maintain a clear airway will improve Outcome: Progressing Goal: Levels of oxygenation will improve Outcome: Progressing Goal: Ability to maintain adequate ventilation will improve Outcome: Progressing

## 2020-07-30 NOTE — Progress Notes (Signed)
Pt has stated she was unable to urinate. Bladder scan revealed <999 urine retained in the bladder. On coming nurse made aware in report and nurse to notify provider.

## 2020-07-30 NOTE — Progress Notes (Signed)
Initial Nutrition Assessment  DOCUMENTATION CODES:   Not applicable  INTERVENTION:  - will order Ensure Enlive once/day each supplement provides 350 kcal and 20 grams of protein. - will order 30 ml Prosource Plus BID, each supplement provides 100 kcal and 15 grams protein.     NUTRITION DIAGNOSIS:   Increased nutrient needs related to acute illness as evidenced by estimated needs.  GOAL:   Patient will meet greater than or equal to 90% of their needs  MONITOR:   PO intake, Supplement acceptance, Labs, Weight trends  REASON FOR ASSESSMENT:   Malnutrition Screening Tool, Consult Assessment of nutrition requirement/status  ASSESSMENT:   75 y.o. female with medical history of HTN, type 2 DM, CHF, PAF not on AC, COPD on 4L home O2. She drinks alcohol daily and continues to smoke. She was admitted 7/19-7/25 due to acute on chronic respiratory failure/COPD exacerbation and RLL CAP. She returned to the ED on 7/26 via EMS due to lethargy and respiratory distress.  Diet advanced from NPO to Heart Healthy/Carb Modified today at 0810. No intakes since that time.   Patient currently sleeping with BiPAP on. No family/visitors present. She did not awake to name call or during NFPE.   This RD saw patient on 07/23/20 at which time she was eating well/at baseline and denied any chewing or swallowing difficulties, any abdominal discomfort or nausea with PO intakes (would get shaky if going long periods without eating), and denied worsening shortness or breath or any difficulties breathing while eating.   Weight today is 199 lb and weight yesterday was 192 lb. She is noted to be -1.3 L since admission yesterday. Weight on 7/23 was 192 lb, and weight on 09/29/19 was 179 lb.   Per notes: - severe physical deconditioning - heavy tobacco use - alcohol abuse - acute on chronic hypoxemic and hypercapnic respiratory failure pending CT with contrast   Labs reviewed; CBGs: 114, 121, 261 mg/dl, Na:  133 mmol/l, Cl: 80 mg/dl, creatinine: 0.39 mg/dl, Phos: 2.4 mg/dl. Medications reviewed; 1 mg folvite/day, sliding scale novolog, 60 mg solu-medrol BID, 1 tablet multivitamin with minerals/day, 100 mg thiamine/day.     NUTRITION - FOCUSED PHYSICAL EXAM:  Completed; no muscle depletions, no fat depletions, no edema at this time.   Diet Order:   Diet Order             Diet heart healthy/carb modified Room service appropriate? Yes; Fluid consistency: Thin  Diet effective now                   EDUCATION NEEDS:   No education needs have been identified at this time  Skin:  Skin Assessment: Reviewed RN Assessment  Last BM:  7/24 (day prior to last d/c)  Height:   Ht Readings from Last 1 Encounters:  07/30/20 '5\' 8"'$  (1.727 m)    Weight:   Wt Readings from Last 1 Encounters:  07/30/20 90.3 kg      Estimated Nutritional Needs:  Kcal:  1850-2075 kcal Protein:  95-110 grams Fluid:  >/= 1.8 L/day       Jarome Matin, MS, RD, LDN, CNSC Inpatient Clinical Dietitian RD pager # available in AMION  After hours/weekend pager # available in [facility]

## 2020-07-30 NOTE — Evaluation (Signed)
Occupational Therapy Evaluation Patient Details Name: [name] MRN: [id number] DOB: 19-Nov-1945 Today's Date: 07/30/2020    History of Present Illness 75 yo F admitted 7/26 via ED due to increased weakness and lethargy. Has COPD (never had PFTs, been on o2 for years ), ongoing smoking, CHF, 4L baseline O2 requirement.  AT baseline lives alone with dog. Uses walker for mobility. Unable to navigate 3 steps without help. Never seen a pulmonary MD. Pt with daily etoh.  Recently admitted to [facility]  7/19-7/25 with acute on chronic hypoxia and hypercapnea.   Clinical Impression   Patient is a 75 year old female who was admitted for above diagnosis. Patient was living at home alone with dog. Currently, patient is min guard for supine to sit on edge of bed with SOB noted and O2 saturation dropping down to 79% on 4L/min. Patient's nurse consulted and agreed for patient to sit up in chair for breakfast. Patient required 2 person hand held assist to transfer from edge of bed to recliner in room. Patient was noted to have O2 saturation fluctuating from 83% to 88% on 4L/min with nurse aware. Patient was set up for meal. Patient was noted to have decreased activity tolerance, endurance and increased shortness of breath with movement impacting participation in ADLs.patient was noted to have poor insight to deficits.Patient would continue to benefit from skilled OT services at this time while admitted and after d/c to address noted deficits in order to improve overall safety and independence in ADLs.     Follow Up Recommendations  SNF    Equipment Recommendations  None recommended by OT    Recommendations for Other Services       Precautions / Restrictions Precautions Precautions: Fall Precaution Comments: chronic 4L O2 Restrictions Weight Bearing Restrictions: No      Mobility Bed Mobility Overal bed mobility: Needs Assistance Bed Mobility: Supine to Sit     Supine to sit: Min guard      General bed mobility comments: increased time and effort, DOE 3/4 with effort and O2 sats intially 86-88% at rest on 4L with nurse aware and agreed for pt to get pt OOB.    Transfers Overall transfer level: Needs assistance Equipment used: 2 person hand held assist Transfers: Sit to/from Stand Sit to Stand: Min guard         General transfer comment: patient required +2 min  assist with pt pivoting to chair with UE support of therapists with patient maintaining on 4L/O2 per min.Pt more dyspneic and sats dropped to 76% with transfer and took 5 min to return to 90% and was still fluctuating between 85% and 90% once recovered.  nurse agreed for PT and OT to leave pt up to eat lunch.    Balance Overall balance assessment: Needs assistance Sitting-balance support: No upper extremity supported;Feet supported Sitting balance-Leahy Scale: Fair     Standing balance support: During functional activity;Bilateral upper extremity supported Standing balance-Leahy Scale: Poor Standing balance comment: relies on support of UES                           ADL either performed or assessed with clinical judgement   ADL Overall ADL's : Needs assistance/impaired Eating/Feeding: Set up;Sitting   Grooming: Wash/dry face;Wash/dry hands;Bed level   Upper Body Bathing: Bed level;Minimal assistance   Lower Body Bathing: Bed level;Maximal assistance   Upper Body Dressing : Bed level;Minimal assistance   Lower Body Dressing: Bed level;Maximal  assistance   Toilet Transfer: Min guard;+2 for safety/equipment   Toileting- Clothing Manipulation and Hygiene: Maximal assistance;Bed level       Functional mobility during ADLs: Min guard;+2 for safety/equipment General ADL Comments: with assistance for O2 cord management. patient was noted to have increased SOB with movement with patient taking increased time to recover. patient is fixated on eating breakfast. patients O2 saturation sitting on  edge of bed dropped to 79% on 4L/min with SOB noted. patient required mod A for transfer from edge of bed to recliner in room.177/46 mmhg supine in bed  157/57 mmhg after transfer into recliner in room.     Vision         Perception     Praxis      Pertinent Vitals/Pain Pain Assessment: No/denies pain     Hand Dominance Right   Extremity/Trunk Assessment Upper Extremity Assessment Upper Extremity Assessment: Overall WFL for tasks assessed   Lower Extremity Assessment Lower Extremity Assessment: Defer to PT evaluation   Cervical / Trunk Assessment Cervical / Trunk Assessment: Kyphotic   Communication Communication Communication: No difficulties   Cognition Arousal/Alertness: Awake/alert Behavior During Therapy: Flat affect Overall Cognitive Status: Within Functional Limits for tasks assessed                                     General Comments       Exercises     Shoulder Instructions      Home Living Family/patient expects to be discharged to:: Private residence Living Arrangements: Alone Available Help at Discharge: Family;Available PRN/intermittently Type of Home: House Home Access: Stairs to enter [employer] of Steps: 3 Entrance Stairs-Rails: Right;Left Home Layout: One level     Bathroom Shower/Tub: Occupational psychologist: Handicapped height Bathroom Accessibility: No   Home Equipment: Environmental consultant - 4 wheels;Toilet riser;Wheelchair - [employer];Shower seat   Additional Comments: 4LO2 at home PTA, mostly mod I with ADLs, assist with IADLs support from family.       Prior Functioning/Environment Level of Independence: Independent with assistive device(s);Needs assistance  Gait / Transfers Assistance Needed: rollator ADL's / Homemaking Assistance Needed: assistance for grocery shopping. Predominantly eats premade meals or microwaved food   Comments: uses rollator in home, WC for long distances; brother  available to assist prn        OT Problem List: Decreased activity tolerance;Cardiopulmonary status limiting activity;Impaired balance (sitting and/or standing);Decreased strength      OT Treatment/Interventions: Self-care/ADL training;DME and/or AE instruction;Therapeutic activities;Balance training;Patient/family education;Therapeutic exercise    OT Goals(Current goals can be found in the care plan section) Acute Rehab OT Goals Patient Stated Goal: to go home OT Goal Formulation: With patient Time For Goal Achievement: 08/13/20 Potential to Achieve Goals: Good  OT Frequency: Min 2X/week   Barriers to D/C:    patient lives at home alone       Co-evaluation PT/OT/SLP Co-Evaluation/Treatment: Yes Reason for Co-Treatment: Complexity of the patient's impairments (multi-system involvement) PT goals addressed during session: Mobility/safety with mobility OT goals addressed during session: ADL's and self-care      AM-PAC OT "6 Clicks" Daily Activity     Outcome Measure Help from another person eating meals?: A Little Help from another person taking care of personal grooming?: A Little Help from another person toileting, which includes using toliet, bedpan, or urinal?: A Lot Help from another person bathing (including washing, rinsing, drying)?: A  Lot Help from another person to put on and taking off regular upper body clothing?: A Lot Help from another person to put on and taking off regular lower body clothing?: A Lot 6 Click Score: 14   End of Session Equipment Utilized During Treatment: Gait belt Nurse Communication: Other (comment) (nurse consulted on patient staying out of bed at end of session)  Activity Tolerance: Patient tolerated treatment well Patient left: in chair;with chair alarm set;with call bell/phone within reach  OT Visit Diagnosis: Muscle weakness (generalized) (M62.81)                Time: [id number] OT Time Calculation (min): 20 min Charges:  OT General  Charges $OT Visit: 1 Visit OT Evaluation $OT Eval Moderate Complexity: 1 Mod  Jackelyn Poling OTR/L, MS Acute Rehabilitation Department Office# [phone] Pager# [phone]   [place] 07/30/2020, 12:41 PM

## 2020-07-30 NOTE — NC FL2 (Signed)
[place] LEVEL OF CARE SCREENING TOOL     IDENTIFICATION  Patient Name: [name] Birthdate: 04/21/1945 Sex: female Admission Date (Current Location): 07/29/2020  [employer] and [place] Number:  Herbalist and Address:  [facility],  [address] [place], [place]      Provider Number: [id number]  Attending Physician Name and Address:  [place] Donnamarie Poag, DO  Relative Name and Phone Number:       Current Level of Care: Hospital Recommended Level of Care: [place] Prior Approval Number:    Date Approved/Denied:   PASRR Number: [id number]  Discharge Plan: SNF    Current Diagnoses: Patient Active Problem List   Diagnosis Date Noted   Chronic diastolic CHF (congestive heart failure) ([place]) 07/30/2020   PAF (paroxysmal atrial fibrillation) ([place]) 07/30/2020   Acute respiratory failure with hypoxia and hypercapnia ([place]) 07/22/2020   Acute on chronic respiratory failure with hypoxia and hypercapnia ([place]) 09/22/2019   Pressure injury of skin 09/22/2019   Labial infection 09/22/2019   Acute exacerbation of CHF (congestive heart failure) ([place]) 09/22/2019   Anemia 01/26/2018   PVD (peripheral vascular disease) ([place]) 01/26/2018   Ischemic ulcer diabetic foot ([place]) 01/26/2018   Malnutrition of moderate degree 01/26/2018   Critical limb ischemia with history of revascularization of same extremity ([place]) 12/21/2017   Hypomagnesemia 08/25/2017   Bilateral back pain 08/24/2017   Compression fracture of thoracic vertebra ([place]) 08/24/2017   Hyponatremia 08/24/2017   Hypokalemia 08/24/2017   Fall 08/24/2017   Microcytic anemia 08/24/2017   Alcohol use 08/24/2017   Depression 08/24/2017   Dyslipidemia associated with type 2 diabetes mellitus ([place]) 04/28/2016   Diabetes mellitus, type II, insulin dependent ([place]) 04/28/2016   GERD without esophagitis 04/28/2016   O2 dependent 04/28/2016   Community acquired pneumonia of  right lower lobe of lung    Sepsis due to pneumonia ([place]) 04/18/2016   COPD exacerbation ([place]) 12/04/2010   Smoker 12/04/2010   Essential hypertension 01/31/2008   Chronic respiratory failure ([place]) 01/31/2008    Orientation RESPIRATION BLADDER Height & Weight     Self, Time, Situation, Place  Other (Comment) (bipap) Continent Weight: 90.3 kg Height:  5' 7.99" (172.7 cm)  BEHAVIORAL SYMPTOMS/MOOD NEUROLOGICAL BOWEL NUTRITION STATUS      Continent Diet (regular)  AMBULATORY STATUS COMMUNICATION OF NEEDS Skin   Extensive Assist Verbally Normal                       Personal Care Assistance Level of Assistance  Bathing, Feeding, Dressing Bathing Assistance: Limited assistance Feeding assistance: Limited assistance Dressing Assistance: Limited assistance     Functional Limitations Info  Sight, Hearing, Speech Sight Info: Adequate Hearing Info: Adequate Speech Info: Adequate    SPECIAL CARE FACTORS FREQUENCY  PT (By licensed PT), OT (By licensed OT)     PT Frequency: 5xweekly OT Frequency: 5 x weekly            Contractures      Additional Factors Info                  Current Medications (07/30/2020):  This is the current hospital active medication list Current Facility-Administered Medications  Medication Dose Route Frequency Provider Last Rate Last Admin   arformoterol (BROVANA) nebulizer solution 15 mcg  15 mcg Nebulization BID Brand Males, MD   15 mcg at 07/30/20 1121   ceFEPIme (MAXIPIME) 2 g in sodium chloride 0.9 % 100  mL IVPB  2 g Intravenous Q8H Etta Quill, DO   Stopped at 07/30/20 0533   chlorhexidine (PERIDEX) 0.12 % solution 15 mL  15 mL Mouth Rinse BID Etta Quill, DO   15 mL at 07/30/20 0913   Chlorhexidine Gluconate Cloth 2 % PADS 6 each  6 each Topical Daily Etta Quill, DO   6 each at 07/30/20 0151   enoxaparin (LOVENOX) injection 40 mg  40 mg Subcutaneous Q24H Jennette Kettle M, DO   40 mg at 07/30/20 0913    fluconazole (DIFLUCAN) tablet 100 mg  100 mg Oral Daily [place] Eric J, DO   100 mg at [id number]   folic acid (FOLVITE) tablet 1 mg  1 mg Oral Daily Jennette Kettle M, DO   1 mg at 07/30/20 [id number]   hydrALAZINE (APRESOLINE) injection 10 mg  10 mg Intravenous Q6H PRN [place] Eric J, DO   10 mg at 07/30/20 1240   insulin aspart (novoLOG) injection 0-9 Units  0-9 Units Subcutaneous Q4H Jennette Kettle M, DO   5 Units at 07/30/20 1240   levalbuterol (XOPENEX) nebulizer solution 0.63 mg  0.63 mg Nebulization Q2H PRN Etta Quill, DO   0.63 mg at 07/30/20 0825   MEDLINE mouth rinse  15 mL Mouth Rinse q12n4p Jennette Kettle M, DO   15 mL at 07/30/20 1243   methylPREDNISolone sodium succinate (SOLU-MEDROL) 125 mg/2 mL injection 60 mg  60 mg Intravenous Q12H Jennette Kettle M, DO   60 mg at 07/30/20 [id number]   metoprolol tartrate (LOPRESSOR) injection 5 mg  5 mg Intravenous Q5 min PRN Etta Quill, DO   5 mg at 07/30/20 0117   multivitamin with minerals tablet 1 tablet  1 tablet Oral Daily Etta Quill, DO   1 tablet at 07/30/20 [id number]   nystatin (MYCOSTATIN/NYSTOP) topical powder   Topical TID [place] Eric J, DO   Given at 07/30/20 1022   revefenacin (YUPELRI) nebulizer solution 175 mcg  175 mcg Nebulization Daily Brand Males, MD   175 mcg at 07/30/20 1120   thiamine tablet 100 mg  100 mg Oral Daily Jennette Kettle M, DO   100 mg at 07/30/20 [id number]   Or   thiamine (B-1) injection 100 mg  100 mg Intravenous Daily Etta Quill, DO         Discharge Medications: Please see discharge summary for a list of discharge medications.  Relevant Imaging Results:  Relevant Lab Results:   Additional Information [phone]  Leeroy Cha, RN

## 2020-07-30 NOTE — H&P (Signed)
History and Physical    [name] [id number] DOB: Sep 01, 1945 DOA: 07/29/2020  PCP: Lujean Amel, MD  Patient coming from: Home  I have personally briefly reviewed patient's old medical records in [place]  Chief Complaint: Respiratory distress, lethargy  HPI: [name] is a 75 y.o. female with medical history significant of HTN, DM2, dCHF, PAF not on AC, COPD on 4L home O2 according to DC summary from 7/25.  Pt also with daily drinking EtOH and still smoking.  Pt recently admitted 7/19-7/25 with acute on chronic resp failure with hypoxia and hypercapnea.  This felt to be driven by COPD exacerbation as well as RLL CAP.  Pt completed ABx in hospital, did require BIPAP initially.  On steroids that were ultimately weaned down.  Pt ultimately down to 4L O2.  DCd home on 7/25.  Unfortunately this seems to have been short lived as pt brought in by EMS to ED earlier this evening (7/26) with lethargy, respiratory distress.  Apparently her brother called EMS after noting that she seemed weaker and more lethargic today.  Pt denies CP, abd pain, or pain anywhere else.   ED Course: Initial ABG: pH 7.222 PCO2 119!  BMP with bicarb of 46  Pt put on rescue BIPAP.  Thankfully this seems to have worked with significant improvement in mental status, work of breathing, and ABG improved to pH 7.34 pCO2 78.7.  BNP 360 is slightly higher than it was on admission 7/19 (237)  WBC nl  COVID and flu neg  CXR: Stable cardiomegaly with bibasilar atelectasis and/or infiltrate, right greater than left. Small to moderate sized right pleural effusion.   Review of Systems: As per HPI, otherwise all review of systems negative.  Past Medical History:  Diagnosis Date   Acute exacerbation of CHF (congestive heart failure) ([place]) 09/22/2019   COPD (chronic obstructive pulmonary disease) (HCC)    Diabetes mellitus    Diastolic dysfunction    history   Hyperlipidemia     Hypertension     Past Surgical History:  Procedure Laterality Date   TUBAL LIGATION       reports that she has been smoking cigarettes. She has a 52.00 pack-year smoking history. She has never used smokeless tobacco. She reports current alcohol use of about 2.0 standard drinks of alcohol per week. She reports that she does not use drugs.  No Known Allergies  Family History  Problem Relation Age of Onset   Heart disease Father    Lung cancer Father      Prior to Admission medications   Medication Sig Start Date End Date Taking? Authorizing Provider  albuterol (VENTOLIN HFA) 108 (90 Base) MCG/ACT inhaler Inhale 2 puffs into the lungs every 4 (four) hours as needed for shortness of breath. 06/02/20   [provider]  amLODipine (NORVASC) 10 MG tablet Take 1 tablet (10 mg total) by mouth daily. 07/28/20 10/26/20  Bonnielee Haff, MD  aspirin EC 81 MG tablet Take 81 mg by mouth daily. Swallow whole.    [provider]  atorvastatin (LIPITOR) 20 MG tablet Take 20 mg by mouth every evening.    [provider]  diazepam (VALIUM) 5 MG tablet Take 1 tablet (5 mg total) by mouth every 12 (twelve) hours as needed for anxiety. 02/01/18   Ghimire, Henreitta Leber, MD  empagliflozin (JARDIANCE) 10 MG TABS tablet Take 10 mg by mouth daily.    [provider]  feeding supplement, ENSURE ENLIVE, (ENSURE ENLIVE) LIQD  Take 237 mLs by mouth 2 (two) times daily between meals. 02/02/18   Ghimire, Henreitta Leber, MD  ferrous sulfate 325 (65 FE) MG tablet Take 1 tablet (325 mg total) by mouth 2 (two) times daily with a meal. 02/01/18   Ghimire, Henreitta Leber, MD  ipratropium-albuterol (DUONEB) 0.5-2.5 (3) MG/3ML SOLN Take 3 mLs by nebulization every 4 (four) hours as needed. 02/01/18   Ghimire, Henreitta Leber, MD  metFORMIN (GLUCOPHAGE) 500 MG tablet Take 500 mg by mouth 2 (two) times daily with a meal.    [provider]  metoprolol succinate (TOPROL-XL) 25 MG 24 hr tablet Take 25 mg by  mouth daily. 06/22/20   [provider]  Multiple Vitamin (MULTIVITAMIN WITH MINERALS) TABS tablet Take 1 tablet by mouth daily.    [provider]  omeprazole (PRILOSEC) 20 MG capsule Take 20 mg by mouth daily. 11/18/10   [provider]  PARoxetine (PAXIL) 20 MG tablet Take 20 mg by mouth daily.     [provider]  predniSONE (DELTASONE) 20 MG tablet Take 3 tablets once daily for 3 days followed by 2 tablets once daily for 3 days followed by 1 tablet once daily for 3 days and then stop 07/28/20   Bonnielee Haff, MD  TRELEGY ELLIPTA 200-62.5-25 MCG/INH AEPB Inhale 1 puff into the lungs daily. 06/22/20   [provider]  apixaban (ELIQUIS) 5 MG TABS tablet Take 1 tablet (5 mg total) by mouth 2 (two) times daily. Patient not taking: Reported on 07/22/2020 09/28/19 07/23/20  Antonieta Pert, MD  metoprolol tartrate (LOPRESSOR) 25 MG tablet Take 1 tablet (25 mg total) by mouth 2 (two) times daily. Patient not taking: Reported on 07/22/2020 09/29/19 07/23/20  Antonieta Pert, MD    Physical Exam: Vitals:   07/30/20 0115 07/30/20 0201 07/30/20 0215 07/30/20 0307  BP: (!) 141/73 (!) 151/76  138/61  Pulse: (!) 104 89 95 98  Resp: '16 18 20 '$ (!) 24  Temp:  97.8 F (36.6 C)    TempSrc:  Axillary    SpO2: (!) 87% (!) 85% 90% 90%  Weight:  90.3 kg    Height:  5' 7.99" (1.727 m)      Constitutional: NAD, calm, comfortable, on BIPAP.  Now able to stay awake for longer periods. Eyes: PERRL, lids and conjunctivae normal ENMT: Mucous membranes are moist. Posterior pharynx clear of any exudate or lesions.Normal dentition.  Neck: normal, supple, no masses, no thyromegaly Respiratory: Diffuse wheezing, no increased WOB, pt on BIPAP Cardiovascular: Regular rate and rhythm, no murmurs / rubs / gallops. No extremity edema. 2+ pedal pulses. No carotid bruits.  Abdomen: no tenderness, no masses palpated. No hepatosplenomegaly. Bowel sounds positive.  Musculoskeletal: no clubbing /  cyanosis. No joint deformity upper and lower extremities. Good ROM, no contractures. Normal muscle tone.  Skin: Multiple bruises B forearms and lower abdomen. Neurologic: CN 2-12 grossly intact. Sensation intact, DTR normal. Strength 5/5 in all 4.  Psychiatric: Normal judgment and insight. Alert Normal mood.    Labs on Admission: I have personally reviewed following labs and imaging studies  CBC: Recent Labs  Lab 07/24/20 0233 07/29/20 1947  WBC 6.9 7.0  NEUTROABS  --  5.6  HGB 12.0 12.3  HCT 36.8 39.7  MCV 102.5* 107.3*  PLT 137* 94*   Basic Metabolic Panel: Recent Labs  Lab 07/24/20 0233 07/24/20 1134 07/26/20 1315 07/27/20 0806 07/27/20 1521 07/28/20 0429 07/29/20 1947  NA 123*   < > 126* 126* 130* 129*  135  K 5.5*   < > 4.9 5.3* 5.4* 4.5 4.7  CL 75*   < > 77* 79* 84* 84* 81*  CO2 39*   < > 41* 39* 39* 38* 46*  GLUCOSE 166*   < > 115* 170* 235* 117* 126*  BUN 33*   < > 57* 44* 44* 36* 22  CREATININE 0.63   < > 0.76 0.69 0.78 0.64 0.43*  CALCIUM 8.9   < > 8.8* 8.7* 8.8* 8.6* 9.2  MG 2.0  --   --   --   --   --   --   PHOS 4.2  --   --   --   --   --   --    < > = values in this interval not displayed.   GFR: Estimated Creatinine Clearance: 72.6 mL/min (A) (by C-G formula based on SCr of 0.43 mg/dL (L)). Liver Function Tests: Recent Labs  Lab 07/24/20 0233 07/29/20 1947  AST 23 21  ALT 18 24  ALKPHOS 67 58  BILITOT 0.4 0.6  PROT 6.3* 6.0*  ALBUMIN 3.6 3.4*   No results for input(s): LIPASE, AMYLASE in the last 168 hours. No results for input(s): AMMONIA in the last 168 hours. Coagulation Profile: No results for input(s): INR, PROTIME in the last 168 hours. Cardiac Enzymes: No results for input(s): CKTOTAL, CKMB, CKMBINDEX, TROPONINI in the last 168 hours. BNP (last 3 results) No results for input(s): PROBNP in the last 8760 hours. HbA1C: No results for input(s): HGBA1C in the last 72 hours. CBG: Recent Labs  Lab 07/27/20 1114 07/27/20 1601  07/27/20 2121 07/28/20 0744 07/28/20 1116  GLUCAP 183* 236* 182* 102* 146*   Lipid Profile: No results for input(s): CHOL, HDL, LDLCALC, TRIG, CHOLHDL, LDLDIRECT in the last 72 hours. Thyroid Function Tests: No results for input(s): TSH, T4TOTAL, FREET4, T3FREE, THYROIDAB in the last 72 hours. Anemia Panel: No results for input(s): VITAMINB12, FOLATE, FERRITIN, TIBC, IRON, RETICCTPCT in the last 72 hours. Urine analysis:    Component Value Date/Time   COLORURINE YELLOW 09/22/2019 2130   APPEARANCEUR CLEAR 09/22/2019 2130   LABSPEC 1.007 09/22/2019 2130   PHURINE 5.0 09/22/2019 2130   GLUCOSEU >=500 (A) 09/22/2019 2130   HGBUR NEGATIVE 09/22/2019 2130   [place] NEGATIVE 09/22/2019 2130   [place] NEGATIVE 09/22/2019 2130   PROTEINUR 30 (A) 09/22/2019 2130   NITRITE NEGATIVE 09/22/2019 2130   LEUKOCYTESUR NEGATIVE 09/22/2019 2130    Radiological Exams on Admission: CT Head Wo Contrast  Result Date: 07/29/2020 CLINICAL DATA:  Mental status changes of unknown cause, lethargy. 75 year old female. EXAM: CT HEAD WITHOUT CONTRAST TECHNIQUE: Contiguous axial images were obtained from the base of the skull through the vertex without intravenous contrast. COMPARISON:  September 22, 2019. FINDINGS: Brain: No evidence of acute infarction, hemorrhage, hydrocephalus, extra-axial collection or mass lesion/mass effect. Mild atrophy and stable cystic lesion in the posterior fossa biased towards the RIGHT behind the cerebellum. Vascular: No hyperdense vessel or unexpected calcification. Skull: Normal. Negative for fracture or focal lesion. Sinuses/Orbits: Visualized paranasal sinuses and orbits with minimal LEFT maxillary and RIGHT sphenoid sinus disease. No air-fluid level. Orbits are unremarkable. Other: None IMPRESSION: 1. No acute intracranial pathology. 2. Stable posterior fossa cystic lesion, likely a arachnoid cyst. 3. Minimal sinus disease. Electronically Signed   By: Zetta Bills M.D.    On: 07/29/2020 19:41   DG Chest Port 1 View  Result Date: 07/29/2020 CLINICAL DATA:  Altered mental status. EXAM: PORTABLE CHEST  1 VIEW COMPARISON:  July 22, 2020 FINDINGS: Mild, diffusely increased lung markings are seen. Moderate to marked severity right basilar atelectasis and/or infiltrate is seen. This is very mildly decreased in severity when compared to the prior study. Mild left basilar atelectasis and/or infiltrate is also noted. There is a small to moderate size right pleural effusion. Mild to moderate severity enlargement of the cardiac silhouette is seen. There is diffuse calcification of the thoracic aorta. The visualized skeletal structures are unremarkable. IMPRESSION: 1. Stable cardiomegaly with bibasilar atelectasis and/or infiltrate, right greater than left. 2. Small to moderate sized right pleural effusion. Electronically Signed   By: Virgina Norfolk M.D.   On: 07/29/2020 19:53    EKG: Independently reviewed.  Assessment/Plan Principal Problem:   Acute on chronic respiratory failure with hypoxia and hypercapnia (HCC) Active Problems:   Essential hypertension   COPD exacerbation (HCC)   Diabetes mellitus, type II, insulin dependent (HCC)   Alcohol use   Chronic diastolic CHF (congestive heart failure) (HCC)   PAF (paroxysmal atrial fibrillation) (HCC)    Acute on chronic resp failure with hypoxia and hypercapnia - Think COPD exacerbation the primary driver of todays presentation CHF a secondary consideration but not a major player at this time without findings of pulm edema. PNA may also be contributing, however: No SIRS Just spent last several days on IV ABx Pt on BIPAP Cont pulse ox Consult pulm in AM due to severity of respiratory failure, failure to improve as outpt after discharge, etc. COPD exacerbation - COPD pathway Solumedrol Scheduled LABA, LAMA, and INH steroid PRN SABA Cefepime Chronic diastolic CHF - Possibly a tiny acute component Will hold off  on lasix for the moment But will also hold off on IVF And pt NPO (on BIPAP) EtOH use - CIWA Didn't have any withdrawal symptoms during last admit though HTN - Will need to use PRN IVs if BP elevates as she is NPO PAF - Not on AC Was on PO metoprolol, but unable to take POs If she starts going into RVR, use IV lopressor DM2 - Sensitive SSI Q4H  DVT prophylaxis: Lovenox Code Status: Full code for now Family Communication: No family in room Disposition Plan: TBD, need clinical improvement or plan first Consults called: Message sent to PCCM for AM pulm consult Admission status: Admit to inpatient  Severity of Illness: The appropriate patient status for this patient is INPATIENT. Inpatient status is judged to be reasonable and necessary in order to provide the required intensity of service to ensure the patient's safety. The patient's presenting symptoms, physical exam findings, and initial radiographic and laboratory data in the context of their chronic comorbidities is felt to place them at high risk for further clinical deterioration. Furthermore, it is not anticipated that the patient will be medically stable for discharge from the hospital within 2 midnights of admission. The following factors support the patient status of inpatient.   Patient has acute respiratory failure with hypoxia due to having a new oxygen requirement.  That is the patient has a PaO2 < 60 (pulse Ox < 90%) on room air.  Patient has acute respiratory failure with hypercapnea.  Patient has a pH < 7.35 and pCO2 > 50    * I certify that at the point of admission it is my clinical judgment that the patient will require inpatient hospital care spanning beyond 2 midnights from the point of admission due to high intensity of service, high risk for further deterioration and high frequency of  surveillance required.*   Firman Petrow M. DO [facility]  How to contact the [facility] Attending or Consulting provider [place] or covering provider during after hours [place], for this patient?  Check the care team in [facility] and look for a) attending/consulting [facility] provider listed and b) the [facility] team listed Log into [email or web address]  Amion Physician Scheduling and messaging for groups and whole hospitals  On call and physician scheduling software for group practices, residents, hospitalists and other medical providers for call, clinic, rotation and shift schedules. OnCall [employer] is a hospital-wide system for scheduling doctors and paging doctors on call. EasyPlot is for scientific plotting and data analysis.  [email or web address]  and use [place]'s universal password to access. If you do not have the password, please contact the hospital operator.  Locate the [facility] provider you are looking for under [facility] and page to a number that you can be directly reached. If you still have difficulty reaching the provider, please page the [facility] (Director on Call) for the Hospitalists listed on amion for assistance.  07/30/2020, 3:17 AM

## 2020-07-30 NOTE — Progress Notes (Signed)
Pt found off of BiPAP and on  at this time

## 2020-07-30 NOTE — Progress Notes (Signed)
Pt currently off BIPAP at this time. RN took pt off. Pt on 10 LPM HF.

## 2020-07-30 NOTE — Progress Notes (Signed)
Physical Therapy Evaluation Patient Details Name: [name] MRN: [id number] DOB: [birth year] Today's Date: 07/30/2020   History of Present Illness  75 yo F admitted 7/26 via ED due to increased weakness and lethargy. Has COPD (never had PFTs, been on o2 for years ), ongoing smoking, CHF, 4L baseline O2 requirement.  AT baseline lives alone with dog. Uses walker for mobility. Unable to navigate 3 steps without help. Never seen a pulmonary MD. Pt with daily etoh.  Recently admitted to [facility]  7/19-7/25 with acute on chronic hypoxia and hypercapnea.  Clinical Impression  Pt admitted with above diagnosis. Pt O2 sats on arrival on 4L were 86-90% at rest. Pt was able only able to transfer to chair with min assist of 2 persons due to desaturation with incr activity. Desaturation to 78% and took 5 min to recover to 88-90%.  Pt may need SNF to return to prior functional level as she is very deconditioned.  Cues for pursed lip breathing.  Pt currently with functional limitations due to the deficits listed below (see PT Problem List). Pt will benefit from skilled PT to increase their independence and safety with mobility to allow discharge to the venue listed below.       Follow Up Recommendations SNF    Equipment Recommendations  None recommended by PT    Recommendations for Other Services       Precautions / Restrictions Precautions Precautions: Fall Precaution Comments: chronic 4L O2 Restrictions Weight Bearing Restrictions: No      Mobility  Bed Mobility Overal bed mobility: Needs Assistance Bed Mobility: Supine to Sit     Supine to sit: Min guard     General bed mobility comments: increased time and effort, DOE 3/4 with effort and O2 sats intially 86-88% at rest on 4L with nurse aware and agreed for PT to get pt OOB.    Transfers Overall transfer level: Needs assistance Equipment used: 2 person hand held assist Transfers: Sit to/from Stand Sit to Stand: Min guard          General transfer comment: patient required +2 min  assist with pt pivoting to chair with UE support of therapists with patient maintaining on 4L/O2 per min.Pt more dyspneic and sats dropped to 76% with transfer and took 5 min to return to 90% and was still fluctuating between 85% and 90% once recovered.  nurse agreed for PT and OT to leave pt up to eat lunch.  Ambulation/Gait                Stairs            Wheelchair Mobility    Modified Rankin (Stroke Patients Only)       Balance Overall balance assessment: Needs assistance Sitting-balance support: No upper extremity supported;Feet supported Sitting balance-Leahy Scale: Fair     Standing balance support: During functional activity;Bilateral upper extremity supported Standing balance-Leahy Scale: Poor Standing balance comment: relies on support of UES                             Pertinent Vitals/Pain Pain Assessment: No/denies pain    Home Living Family/patient expects to be discharged to:: Private residence Living Arrangements: Alone Available Help at Discharge: Family;Available PRN/intermittently Type of Home: House Home Access: Stairs to enter Entrance Stairs-Rails: Psychiatric nurse of Steps: 3 Home Layout: One level Home Equipment: Walker - 4 wheels;Toilet riser;Wheelchair - [employer];Shower seat Additional Comments: 4LO2 at  home PTA, mostly mod I with ADLs, assist with IADLs support from family.     Prior Function Level of Independence: Independent with assistive device(s);Needs assistance   Gait / Transfers Assistance Needed: rollator  ADL's / Homemaking Assistance Needed: assistance for grocery shopping. Predominantly eats premade meals or microwaved food  Comments: uses rollator in home, WC for long distances; brother available to assist prn     Hand Dominance   Dominant Hand: Right    Extremity/Trunk Assessment   Upper Extremity Assessment Upper  Extremity Assessment: Defer to OT evaluation    Lower Extremity Assessment Lower Extremity Assessment: Generalized weakness    Cervical / Trunk Assessment Cervical / Trunk Assessment: Kyphotic  Communication   Communication: No difficulties  Cognition Arousal/Alertness: Awake/alert Behavior During Therapy: Flat affect Overall Cognitive Status: Within Functional Limits for tasks assessed                                        General Comments      Exercises     Assessment/Plan    PT Assessment Patient needs continued PT services  PT Problem List Decreased strength;Decreased mobility;Decreased activity tolerance;Decreased balance;Decreased knowledge of use of DME       PT Treatment Interventions Gait training;DME instruction;Therapeutic exercise;Functional mobility training;Therapeutic activities;Patient/family education    PT Goals (Current goals can be found in the Care Plan section)  Acute Rehab PT Goals Patient Stated Goal: unable to state PT Goal Formulation: With patient Time For Goal Achievement: 08/13/20 Potential to Achieve Goals: Good    Frequency Min 3X/week   Barriers to discharge        Co-evaluation PT/OT/SLP Co-Evaluation/Treatment: Yes Reason for Co-Treatment: Complexity of the patient's impairments (multi-system involvement);For patient/therapist safety PT goals addressed during session: Mobility/safety with mobility         AM-PAC PT "6 Clicks" Mobility  Outcome Measure Help needed turning from your back to your side while in a flat bed without using bedrails?: A Little Help needed moving from lying on your back to sitting on the side of a flat bed without using bedrails?: A Little Help needed moving to and from a bed to a chair (including a wheelchair)?: A Little Help needed standing up from a chair using your arms (e.g., wheelchair or bedside chair)?: A Little Help needed to walk in hospital room?: Total Help needed  climbing 3-5 steps with a railing? : Total 6 Click Score: 14    End of Session Equipment Utilized During Treatment: Gait belt;Oxygen Activity Tolerance: Patient limited by fatigue Patient left: in chair;with call bell/phone within reach;with chair alarm set Nurse Communication: Mobility status PT Visit Diagnosis: Other abnormalities of gait and mobility (R26.89)    Time: [id number] PT Time Calculation (min) (ACUTE ONLY): 17 min   Charges:   PT Evaluation $PT Eval Moderate Complexity: 1 Mod          Elona Yinger M,PT Acute Rehab Services [phone] (pager)   Alvira Philips 07/30/2020, 11:16 AM

## 2020-07-30 NOTE — Progress Notes (Signed)
Pt transported to ICU on bipap 50% fio2.  Pt tolerated transport well without incident.

## 2020-07-30 NOTE — TOC Initial Note (Addendum)
Transition of Care [facility]) - Initial/Assessment Note    Patient Details  Name: [name] MRN: [id number] Date of Birth: 22-Sep-1945  Transition of Care [facility]) CM/SW Contact:    Leeroy Cha, RN Phone Number: 07/30/2020, 7:49 AM  Clinical Narrative:                 75 y.o. female with medical history significant of HTN, DM2, dCHF, PAF not on AC, COPD on 4L home O2 according to DC summary from 7/25.   Pt also with daily drinking EtOH and still smoking.   Pt recently admitted 7/19-7/25 with acute on chronic resp failure with hypoxia and hypercapnea.  This felt to be driven by COPD exacerbation as well as RLL CAP.   Pt completed ABx in hospital, did require BIPAP initially.  On steroids that were ultimately weaned down.  Pt ultimately down to 4L O2.  DCd home on 7/25.   Unfortunately this seems to have been short lived as pt brought in by EMS to ED earlier this evening (7/26) with lethargy, respiratory distress.  Apparently her brother called EMS after noting that she seemed weaker and more lethargic today.   Pt denies CP, abd pain, or pain anywhere else.     ED Course: Initial ABG: pH 7.222 PCO2 119!   BMP with bicarb of 46   Pt put on rescue BIPAP. TOC PLAN OF CAREfollowing for toc needs was set up with hhc and o2 on last admission. Pt will need snf placement has been in [place] [facility] in the past.  Will need to be on bi-pap. Fl2 faxed out to area snf's. Expected Discharge Plan: [place] Barriers to Discharge: Continued Medical Work up   Patient Goals and CMS Choice   CMS Medicare.gov Compare Post Acute Care list provided to:: Patient    Expected Discharge Plan and Services Expected Discharge Plan: [place]   Discharge Planning Services: CM Consult Post Acute Care Choice: Resumption of Svcs/PTA Provider Living arrangements for the past 2 months: Single Family Home                                      Prior Living  Arrangements/Services Living arrangements for the past 2 months: Single Family Home Lives with:: Self Patient language and need for interpreter reviewed:: Yes Do you feel safe going back to the place where you live?: Yes            Criminal Activity/Legal Involvement Pertinent to Current Situation/Hospitalization: No - Comment as needed  Activities of Daily Living Home Assistive Devices/Equipment: Oxygen, Nebulizer, Walker (specify type) ADL Screening (condition at time of admission) Patient's cognitive ability adequate to safely complete daily activities?: Yes Is the patient deaf or have difficulty hearing?: No Does the patient have difficulty seeing, even when wearing glasses/contacts?: No Does the patient have difficulty concentrating, remembering, or making decisions?: No Patient able to express need for assistance with ADLs?: Yes Does the patient have difficulty dressing or bathing?: No Independently performs ADLs?: Yes (appropriate for developmental age) Does the patient have difficulty walking or climbing stairs?: Yes Weakness of Legs: Both Weakness of Arms/Hands: Both  Permission Sought/Granted                  Emotional Assessment Appearance:: Appears stated age     Orientation: : Oriented to Self, Oriented to Place, Oriented to  Time, Oriented to Situation Alcohol / Substance Use: Not Applicable Psych Involvement: No (comment)  Admission diagnosis:  Acute respiratory failure with hypoxia and hypercapnia (HCC) [J96.01, J96.02] Acute on chronic respiratory failure with hypoxia and hypercapnia (HCC) [id number], J96.22] Patient Active Problem List   Diagnosis Date Noted   Chronic diastolic CHF (congestive heart failure) (HCC) 07/30/2020   PAF (paroxysmal atrial fibrillation) ([place]) 07/30/2020   Acute respiratory failure with hypoxia and hypercapnia (HCC) 07/22/2020   Acute on chronic respiratory failure with hypoxia and hypercapnia ([place]) 09/22/2019   Pressure injury  of skin 09/22/2019   Labial infection 09/22/2019   Acute exacerbation of CHF (congestive heart failure) ([place]) 09/22/2019   Anemia 01/26/2018   PVD (peripheral vascular disease) ([place]) 01/26/2018   Ischemic ulcer diabetic foot ([place]) 01/26/2018   Malnutrition of moderate degree 01/26/2018   Critical limb ischemia with history of revascularization of same extremity ([place]) 12/21/2017   Hypomagnesemia 08/25/2017   Bilateral back pain 08/24/2017   Compression fracture of thoracic vertebra ([place]) 08/24/2017   Hyponatremia 08/24/2017   Hypokalemia 08/24/2017   Fall 08/24/2017   Microcytic anemia 08/24/2017   Alcohol use 08/24/2017   Depression 08/24/2017   Dyslipidemia associated with type 2 diabetes mellitus ([place]) 04/28/2016   Diabetes mellitus, type II, insulin dependent ([place]) 04/28/2016   GERD without esophagitis 04/28/2016   O2 dependent 04/28/2016   Community acquired pneumonia of right lower lobe of lung    Sepsis due to pneumonia ([place]) 04/18/2016   COPD exacerbation ([place]) 12/04/2010   Smoker 12/04/2010   Essential hypertension 01/31/2008   Chronic respiratory failure ([place]) 01/31/2008   PCP:  Lujean Amel, MD Pharmacy:   [facility] [address] [place] - [address] [place] [place] [address] Phone: [phone] Fax: [place] [address] [place] [address] Phone: [phone] Fax: [phone]     Social Determinants of Health (SDOH) Interventions    Readmission Risk Interventions No flowsheet data found.

## 2020-07-31 ENCOUNTER — Inpatient Hospital Stay (HOSPITAL_COMMUNITY): Payer: Medicare HMO

## 2020-07-31 ENCOUNTER — Other Ambulatory Visit (HOSPITAL_COMMUNITY): Payer: Medicare HMO

## 2020-07-31 DIAGNOSIS — J9621 Acute and chronic respiratory failure with hypoxia: Secondary | ICD-10-CM | POA: Diagnosis not present

## 2020-07-31 DIAGNOSIS — J9622 Acute and chronic respiratory failure with hypercapnia: Secondary | ICD-10-CM | POA: Diagnosis not present

## 2020-07-31 LAB — PHOSPHORUS: Phosphorus: 1.7 mg/dL — ABNORMAL LOW (ref 2.5–4.6)

## 2020-07-31 LAB — GLUCOSE, PLEURAL OR PERITONEAL FLUID: Glucose, Fluid: 186 mg/dL

## 2020-07-31 LAB — ALBUMIN, PLEURAL OR PERITONEAL FLUID: Albumin, Fluid: 1.3 g/dL

## 2020-07-31 LAB — CBC
HCT: 34.4 % — ABNORMAL LOW (ref 36.0–46.0)
Hemoglobin: 11.4 g/dL — ABNORMAL LOW (ref 12.0–15.0)
MCH: 33.2 pg (ref 26.0–34.0)
MCHC: 33.1 g/dL (ref 30.0–36.0)
MCV: 100.3 fL — ABNORMAL HIGH (ref 80.0–100.0)
Platelets: 106 10*3/uL — ABNORMAL LOW (ref 150–400)
RBC: 3.43 MIL/uL — ABNORMAL LOW (ref 3.87–5.11)
RDW: 12.6 % (ref 11.5–15.5)
WBC: 4 10*3/uL (ref 4.0–10.5)
nRBC: 0 % (ref 0.0–0.2)

## 2020-07-31 LAB — GLUCOSE, CAPILLARY
Glucose-Capillary: 177 mg/dL — ABNORMAL HIGH (ref 70–99)
Glucose-Capillary: 162 mg/dL — ABNORMAL HIGH (ref 70–99)
Glucose-Capillary: 215 mg/dL — ABNORMAL HIGH (ref 70–99)
Glucose-Capillary: 256 mg/dL — ABNORMAL HIGH (ref 70–99)
Glucose-Capillary: 209 mg/dL — ABNORMAL HIGH (ref 70–99)
Glucose-Capillary: 177 mg/dL — ABNORMAL HIGH (ref 70–99)

## 2020-07-31 LAB — AMYLASE, PLEURAL OR PERITONEAL FLUID: Amylase, Fluid: 15 U/L

## 2020-07-31 LAB — COMPREHENSIVE METABOLIC PANEL
ALT: 20 U/L (ref 0–44)
AST: 16 U/L (ref 15–41)
Albumin: 3.1 g/dL — ABNORMAL LOW (ref 3.5–5.0)
Alkaline Phosphatase: 53 U/L (ref 38–126)
Anion gap: 10 (ref 5–15)
BUN: 26 mg/dL — ABNORMAL HIGH (ref 8–23)
CO2: 39 mmol/L — ABNORMAL HIGH (ref 22–32)
Calcium: 8.9 mg/dL (ref 8.9–10.3)
Chloride: 82 mmol/L — ABNORMAL LOW (ref 98–111)
Creatinine, Ser: 0.53 mg/dL (ref 0.44–1.00)
GFR, Estimated: >60 mL/min (ref >60)
Glucose, Bld: 148 mg/dL — ABNORMAL HIGH (ref 70–99)
Potassium: 4.1 mmol/L (ref 3.5–5.1)
Sodium: 131 mmol/L — ABNORMAL LOW (ref 135–145)
Total Bilirubin: 0.7 mg/dL (ref 0.3–1.2)
Total Protein: 5.5 g/dL — ABNORMAL LOW (ref 6.5–8.1)

## 2020-07-31 LAB — LEGIONELLA PNEUMOPHILA SEROGP 1 UR AG: L. pneumophila Serogp 1 Ur Ag: NEGATIVE

## 2020-07-31 LAB — BODY FLUID CELL COUNT WITH DIFFERENTIAL
Lymphs, Fluid: 25 %
Monocyte-Macrophage-Serous Fluid: 65 % (ref 50–90)
Neutrophil Count, Fluid: 10 % (ref 0–25)
Total Nucleated Cell Count, Fluid: 352 cu mm (ref 0–1000)

## 2020-07-31 LAB — LACTATE DEHYDROGENASE, PLEURAL OR PERITONEAL FLUID: LD, Fluid: 50 U/L — ABNORMAL HIGH (ref 3–23)

## 2020-07-31 LAB — PROCALCITONIN: Procalcitonin: <0.1 ng/mL

## 2020-07-31 LAB — MAGNESIUM: Magnesium: 1.8 mg/dL (ref 1.7–2.4)

## 2020-07-31 LAB — TROPONIN I (HIGH SENSITIVITY): Troponin I (High Sensitivity): 4 ng/L (ref <18)

## 2020-07-31 LAB — HEMOGLOBIN A1C
Hgb A1c MFr Bld: 5.8 % — ABNORMAL HIGH (ref 4.8–5.6)
Mean Plasma Glucose: 120 mg/dL

## 2020-07-31 LAB — PROTEIN, PLEURAL OR PERITONEAL FLUID: Total protein, fluid: <3 g/dL

## 2020-07-31 MED ORDER — DILTIAZEM HCL-DEXTROSE 125-5 MG/125ML-% IV SOLN (PREMIX)
5.0000 mg/h | INTRAVENOUS | Status: DC
  Filled 2020-07-31: qty 125

## 2020-07-31 MED ORDER — FUROSEMIDE 10 MG/ML IJ SOLN
20.0000 mg | Freq: Once | INTRAMUSCULAR | Status: AC
  Administered 2020-07-31: 20 mg via INTRAVENOUS
  Filled 2020-07-31: qty 2

## 2020-07-31 MED ORDER — SODIUM PHOSPHATES 45 MMOLE/15ML IV SOLN
20.0000 mmol | Freq: Once | INTRAVENOUS | Status: AC
  Administered 2020-07-31: 20 mmol via INTRAVENOUS
  Filled 2020-07-31: qty 6.67

## 2020-07-31 MED ORDER — METHYLPREDNISOLONE SODIUM SUCC 125 MG IJ SOLR
60.0000 mg | Freq: Three times a day (TID) | INTRAMUSCULAR | Status: DC
  Administered 2020-07-31 – 2020-08-01 (×3): 60 mg via INTRAVENOUS
  Filled 2020-07-31 (×3): qty 2

## 2020-07-31 MED ORDER — LIDOCAINE HCL 1 % IJ SOLN
INTRAMUSCULAR | Status: AC
  Filled 2020-07-31: qty 20

## 2020-07-31 MED ORDER — DILTIAZEM HCL-DEXTROSE 125-5 MG/125ML-% IV SOLN (PREMIX): 5.0000 mg/h | INTRAVENOUS | Status: DC

## 2020-07-31 MED ORDER — DILTIAZEM HCL 30 MG PO TABS
30.0000 mg | ORAL_TABLET | Freq: Four times a day (QID) | ORAL | Status: DC
  Administered 2020-07-31 – 2020-08-02 (×8): 30 mg via ORAL
  Filled 2020-07-31 (×8): qty 1

## 2020-07-31 MED ORDER — LEVALBUTEROL HCL 0.63 MG/3ML IN NEBU
0.6300 mg | INHALATION_SOLUTION | RESPIRATORY_TRACT | Status: DC
  Administered 2020-07-31 – 2020-08-02 (×13): 0.63 mg via RESPIRATORY_TRACT
  Filled 2020-07-31 (×13): qty 3

## 2020-07-31 MED ORDER — INSULIN GLARGINE-YFGN 100 UNIT/ML ~~LOC~~ SOLN
10.0000 [IU] | Freq: Every day | SUBCUTANEOUS | Status: DC
  Administered 2020-07-31 – 2020-08-02 (×3): 10 [IU] via SUBCUTANEOUS
  Filled 2020-07-31 (×3): qty 0.1

## 2020-07-31 MED ORDER — FLUCONAZOLE 150 MG PO TABS
150.0000 mg | ORAL_TABLET | ORAL | Status: DC
Start: 2020-08-07 — End: 2020-08-06

## 2020-07-31 MED ORDER — MAGNESIUM SULFATE 2 GM/50ML IV SOLN
2.0000 g | Freq: Once | INTRAVENOUS | Status: AC
  Administered 2020-07-31: 2 g via INTRAVENOUS
  Filled 2020-07-31: qty 50

## 2020-07-31 MED ORDER — METOPROLOL TARTRATE 5 MG/5ML IV SOLN
5.0000 mg | INTRAVENOUS | Status: DC
  Filled 2020-07-31: qty 5

## 2020-07-31 MED ORDER — INSULIN ASPART 100 UNIT/ML IJ SOLN
3.0000 [IU] | Freq: Three times a day (TID) | INTRAMUSCULAR | Status: DC
  Administered 2020-07-31 – 2020-08-02 (×5): 3 [IU] via SUBCUTANEOUS

## 2020-07-31 MED ORDER — MELATONIN 3 MG PO TABS
3.0000 mg | ORAL_TABLET | Freq: Every evening | ORAL | Status: DC | PRN
  Administered 2020-07-31 – 2020-08-05 (×6): 3 mg via ORAL
  Filled 2020-07-31 (×6): qty 1

## 2020-07-31 MED ORDER — DILTIAZEM LOAD VIA INFUSION
20.0000 mg | Freq: Once | INTRAVENOUS | Status: DC
  Filled 2020-07-31: qty 20

## 2020-07-31 NOTE — Consult Note (Addendum)
NAME:  [name], MRN:  [id number], DOB:  1945-12-07, LOS: 1 ADMISSION DATE:  07/29/2020, CONSULTATION DATE:  7/27?22 REFERRING MD:  Dr Eric [place] [facility], CHIEF COMPLAINT:  resp failure  PCP Koirala, [place], MD   BRIEF   75 yo F with COPD NOs (never had PFTs, beeon on o2 for years NOS), ongoing smoking, dCHF, 4L baseline O2 requirement.  AT baseline lives alone with dog. Uses walker for mobility. Unable to navigate 3 steps without help. Never seen a pulmonary MD. Never had PFt. Last CT chest 2018.  Chart reports daily etoh as well. No known drug use or benzo or opioid use.  Baseline functional status appears to be ECOG 3-4   Pt admitted to [facility]  7/19-7/25 with acute on chronic hypoxia and hypercapnea. With concern for RLL CAP NOS. Readmitted via ED 07/29/20 due to increased weakness and lethargy. Extreme resp acidosis in ER with pH 7.22 pCO2 of 119.  bicarb 46.  Salvaged with BIPAP and mental status and ABG improved. CCM consulted mornign of 07/30/20 for acute on chronic resp failure and recurrent admissions.  On mormning of 07/30/20 watching TV and oriented but seems to suffer from poor motivation and giving poor history. She is unclear how long she has been on o2 and using walker . REfers to her brother her caretaker for answerrs to many questions  At admit wbc normal, covid pcr/flu pcr neg, CXR with mild pleural effusion. BNO 360 (recently 237 in July). Per RN - no active wheezing (was wheezing in th ER)   Pertinent  Medical History   No flowsheet data found.   has a past medical history of Acute exacerbation of CHF (congestive heart failure) ([place]) (09/22/2019), COPD (chronic obstructive pulmonary disease) ([place]), Diabetes mellitus, Diastolic dysfunction, Hyperlipidemia, and Hypertension.   reports that she has been smoking cigarettes. She has a 52.00 pack-year smoking history. She has never used smokeless tobacco.  Past Surgical History:  Procedure Laterality Date   TUBAL LIGATION       No Known Allergies  Immunization History  Administered Date(s) Administered   Influenza Split 12/02/2010   PPD Test 04/23/2016   Pneumococcal Polysaccharide-23 02/03/2018    Family History  Problem Relation Age of Onset   Heart disease Father    Lung cancer Father      Current Facility-Administered Medications:    (feeding supplement) PROSource Plus liquid 30 mL, 30 mL, Oral, BID BM, [place] Eric J, DO, 30 mL at 07/30/20 2100   amLODipine (NORVASC) tablet 10 mg, 10 mg, Oral, Daily, [place] Donnamarie Poag, DO, 10 mg at 07/30/20 1654   arformoterol (BROVANA) nebulizer solution 15 mcg, 15 mcg, Nebulization, BID, Chase Caller, Sky Borboa, MD, 15 mcg at 07/31/20 0814   ceFEPIme (MAXIPIME) 2 g in sodium chloride 0.9 % 100 mL IVPB, 2 g, Intravenous, Q8H, Gardner, Jared M, DO, Stopped at 07/31/20 0538   chlorhexidine (PERIDEX) 0.12 % solution 15 mL, 15 mL, Mouth Rinse, BID, Alcario Drought, Jared M, DO, 15 mL at 07/30/20 2117   Chlorhexidine Gluconate Cloth 2 % PADS 6 each, 6 each, Topical, Daily, [place] Eric J, DO   enoxaparin (LOVENOX) injection 40 mg, 40 mg, Subcutaneous, Q24H, Gardner, Jared M, DO, 40 mg at 07/30/20 0913   feeding supplement (ENSURE ENLIVE / ENSURE PLUS) liquid 237 mL, 237 mL, Oral, Q24H, [place] Eric J, DO, 237 mL at 07/30/20 1443   fluconazole (DIFLUCAN) tablet 100 mg, 100 mg, Oral, Daily, [place] Donnamarie Poag, DO, 100 mg at 07/30/20 [id number]  folic acid (FOLVITE) tablet 1 mg, 1 mg, Oral, Daily, Alcario Drought, Jared M, DO, 1 mg at 07/30/20 0910   hydrALAZINE (APRESOLINE) injection 10 mg, 10 mg, Intravenous, Q6H PRN, [place] Donnamarie Poag, DO, 10 mg at 07/31/20 0044   insulin aspart (novoLOG) injection 0-9 Units, 0-9 Units, Subcutaneous, Q4H, Etta Quill, DO, 2 Units at 07/31/20 0836   levalbuterol (XOPENEX) nebulizer solution 0.63 mg, 0.63 mg, Nebulization, Q2H PRN, Etta Quill, DO, 0.63 mg at 07/30/20 0825   MEDLINE mouth rinse, 15 mL, Mouth Rinse, q12n4p, Alcario Drought, Jared M, DO, 15 mL at  07/30/20 1655   methylPREDNISolone sodium succinate (SOLU-MEDROL) 125 mg/2 mL injection 60 mg, 60 mg, Intravenous, Q12H, Alcario Drought, Jared M, DO, 60 mg at 07/30/20 2118   metoprolol succinate (TOPROL-XL) 24 hr tablet 25 mg, 25 mg, Oral, Daily, [place] Donnamarie Poag, DO, 25 mg at 07/31/20 [id number]   metoprolol tartrate (LOPRESSOR) injection 5 mg, 5 mg, Intravenous, Q5 min PRN, Alcario Drought, Jared M, DO, 5 mg at 07/31/20 [id number]   metoprolol tartrate (LOPRESSOR) injection 5 mg, 5 mg, Intravenous, NOW, [place] Eric J, DO   multivitamin with minerals tablet 1 tablet, 1 tablet, Oral, Daily, Alcario Drought, Jared M, DO, 1 tablet at 07/30/20 0910   nystatin (MYCOSTATIN/NYSTOP) topical powder, , Topical, TID, [place] Donnamarie Poag, DO, Given at 07/30/20 2124   PARoxetine (PAXIL) tablet 20 mg, 20 mg, Oral, Daily, [place] Donnamarie Poag, DO, 20 mg at 07/30/20 1654   revefenacin (YUPELRI) nebulizer solution 175 mcg, 175 mcg, Nebulization, Daily, Emeline Simpson, MD, 175 mcg at 07/30/20 1120   thiamine tablet 100 mg, 100 mg, Oral, Daily, 100 mg at 07/30/20 0910 **OR** thiamine (B-1) injection 100 mg, 100 mg, Intravenous, Daily, Etta Quill, DO   Significant Hospital Events: Including procedures, antibiotic start and stop dates in addition to other pertinent events   07/29/2020 - admit 7/27 - ccm consult  Interim History / Subjective:    7/28 -  urine strep negative,  MRSA PCR - neg. Refusing bath -.  On 10L HFNC.  Trop normal. PCT negatiive x 2. In  AFib RVR this morning. - lopressors given. Med review shows prn hydralazine. Currently on BiPAP. Mag and phos low  CT chest with moderate R Pleural effusion , evidence of pulm htn and enlarged paratracheal nodes  Objective   Blood pressure (!) 143/65, pulse (!) 105, temperature 98.1 F (36.7 C), temperature source Oral, resp. rate (!) 24, height '5\' 8"'$  (1.727 m), weight 90.3 kg, SpO2 93 %.    FiO2 (%):  [50 %] 50 %   Intake/Output Summary (Last 24 hours) at 07/31/2020 0923 Last data  filed at 07/31/2020 0442 Gross per 24 hour  Intake 197.82 ml  Output 2060 ml  Net -1862.18 ml   Filed Weights   07/29/20 1720 07/30/20 0201  Weight: 87 kg 90.3 kg    Examination: General: disheveled. Faint odor of no shower x  days.  REfused showed HENT:  Sitting on commode. BiPAP on Lungs: . Distant breath sounds. Has wheeze faint posteriorly. Somewhat more labored today Cardiovascular:  a Fib RVR Abdomen: soft Extremities: no cyansis, no clubbing, no edema Neuro: AxOx3.  GU: Not examined    LABS  Results for BRENTON, ARBAIZA (MRN [id number]) as of 07/30/2020 09:21  Ref. Range 07/29/2020 19:47 07/29/2020 20:23 07/29/2020 23:24  pH, Ven Latest Ref Range: 7.250 - 7.430  7.222 (L)  7.343  pCO2, Ven Latest Ref Range: 44.0 - 60.0 mmHg 119 (HH)  78.7 (HH)  pO2, Ven Latest Ref Range: 32.0 - 45.0 mmHg 82.2 (H)  57.0 (H)    PULMONARY Recent Labs  Lab 07/29/20 1947 07/29/20 2324  HCO3 46.9* 41.6*  O2SAT 94.9 88.3    CBC Recent Labs  Lab 07/29/20 1947 07/30/20 0653 07/31/20 0245  HGB 12.3 12.1 11.4*  HCT 39.7 38.2 34.4*  WBC 7.0 7.1 4.0  PLT 94* 92* 106*    COAGULATION No results for input(s): INR in the last 168 hours.  CARDIAC  No results for input(s): TROPONINI in the last 168 hours. No results for input(s): PROBNP in the last 168 hours.   CHEMISTRY Recent Labs  Lab 07/27/20 1521 07/28/20 0429 07/29/20 1947 07/30/20 0500 07/30/20 0653 07/31/20 0245  NA 130* 129* 135  --  133* 131*  K 5.4* 4.5 4.7  --  4.7 4.1  CL 84* 84* 81*  --  80* 82*  CO2 39* 38* 46*  --  44* 39*  GLUCOSE 235* 117* 126*  --  102* 148*  BUN 44* 36* 22  --  20 26*  CREATININE 0.78 0.64 0.43*  --  0.39* 0.53  CALCIUM 8.8* 8.6* 9.2  --  9.1 8.9  MG  --   --   --  1.8  --  1.8  PHOS  --   --   --  2.4*  --  1.7*   Estimated Creatinine Clearance: 72.6 mL/min (by C-G formula based on SCr of 0.53 mg/dL).   LIVER Recent Labs  Lab 07/29/20 1947 07/31/20 0245  AST 21 16  ALT 24  20  ALKPHOS 58 53  BILITOT 0.6 0.7  PROT 6.0* 5.5*  ALBUMIN 3.4* 3.1*     INFECTIOUS Recent Labs  Lab 07/30/20 1049 07/30/20 1235 07/31/20 0245  LATICACIDVEN 2.9*  --   --   PROCALCITON  --  <0.10 <0.10     ENDOCRINE CBG (last 3)  Recent Labs    07/30/20 2349 07/31/20 0438 07/31/20 0825  GLUCAP 159* 177* 162*         IMAGING x48h  - image(s) personally visualized  -   highlighted in bold CT Head Wo Contrast  Result Date: 07/29/2020 CLINICAL DATA:  Mental status changes of unknown cause, lethargy. 75 year old female. EXAM: CT HEAD WITHOUT CONTRAST TECHNIQUE: Contiguous axial images were obtained from the base of the skull through the vertex without intravenous contrast. COMPARISON:  September 22, 2019. FINDINGS: Brain: No evidence of acute infarction, hemorrhage, hydrocephalus, extra-axial collection or mass lesion/mass effect. Mild atrophy and stable cystic lesion in the posterior fossa biased towards the RIGHT behind the cerebellum. Vascular: No hyperdense vessel or unexpected calcification. Skull: Normal. Negative for fracture or focal lesion. Sinuses/Orbits: Visualized paranasal sinuses and orbits with minimal LEFT maxillary and RIGHT sphenoid sinus disease. No air-fluid level. Orbits are unremarkable. Other: None IMPRESSION: 1. No acute intracranial pathology. 2. Stable posterior fossa cystic lesion, likely a arachnoid cyst. 3. Minimal sinus disease. Electronically Signed   By: Zetta Bills M.D.   On: 07/29/2020 19:41   CT Chest High Resolution  Result Date: 07/30/2020 CLINICAL DATA:  Hypoxia, chronic respiratory failure, extreme respiratory acidosis, BiPAP EXAM: CT CHEST WITHOUT CONTRAST TECHNIQUE: Multidetector CT imaging of the chest was performed following the standard protocol without intravenous contrast. High resolution imaging of the lungs, as well as inspiratory and expiratory imaging, was performed. COMPARISON:  10/08/2016 FINDINGS: Cardiovascular: Aortic  atherosclerosis. Normal heart size. Three-vessel coronary artery calcifications. No pericardial effusion. Gross enlargement of the main pulmonary artery  measuring up to 4.1 cm in caliber. Mediastinum/Nodes: Markedly enlarged pretracheal and right hilar lymph nodes, largest pretracheal node measuring 2.9 x 2.1 cm (series 2, image 63). Thyroid gland, trachea, and esophagus demonstrate no significant findings. Lungs/Pleura: Moderate right, small left pleural effusions with extensive associated atelectasis or consolidation of the bilateral lung bases. There is mild interlobular septal thickening and minimal centrilobular and paraseptal emphysema. Diffuse bilateral bronchial wall thickening. No significant air trapping on expiratory phase imaging. Upper Abdomen: No acute abnormality. Musculoskeletal: No chest wall mass or suspicious bone lesions identified. There are new, although age indeterminate wedge deformities of 11, T12, and L1, with a high-grade vertebral plana deformity of T12 (series 8, image 64). IMPRESSION: 1. Assessment for fibrotic interstitial lung disease is significantly limited by the presence of pleural effusions. Within this limitation, there is no specific evidence of fibrotic interstitial lung disease. 2. Moderate right, small left pleural effusions with extensive associated atelectasis or consolidation of the bilateral lung bases. There is mild interlobular septal thickening, most consistent with mild pulmonary edema. 3. Minimal emphysema. 4. Diffuse bilateral bronchial wall thickening, which may reflect interstitial edema and/or infectious/inflammatory bronchitis. 5. Markedly enlarged pretracheal and right hilar lymph nodes, largest pretracheal node measuring 2.9 x 2.1 cm. These are nonspecific and most likely reactive, although neoplasm is difficult to exclude. 6. Gross enlargement of the main pulmonary artery measuring up to 4.1 cm in caliber, as can be seen in pulmonary hypertension. 7.  Coronary artery disease. Aortic Atherosclerosis (ICD10-I70.0) and Emphysema (ICD10-J43.9). Electronically Signed   By: Eddie Candle M.D.   On: 07/30/2020 15:13   DG Chest Port 1 View  Result Date: 07/29/2020 CLINICAL DATA:  Altered mental status. EXAM: PORTABLE CHEST 1 VIEW COMPARISON:  July 22, 2020 FINDINGS: Mild, diffusely increased lung markings are seen. Moderate to marked severity right basilar atelectasis and/or infiltrate is seen. This is very mildly decreased in severity when compared to the prior study. Mild left basilar atelectasis and/or infiltrate is also noted. There is a small to moderate size right pleural effusion. Mild to moderate severity enlargement of the cardiac silhouette is seen. There is diffuse calcification of the thoracic aorta. The visualized skeletal structures are unremarkable. IMPRESSION: 1. Stable cardiomegaly with bibasilar atelectasis and/or infiltrate, right greater than left. 2. Small to moderate sized right pleural effusion. Electronically Signed   By: Virgina Norfolk M.D.   On: 07/29/2020 19:53     Resolved Hospital Problem list   x  Assessment & Plan:  Baseline prior to and at the time of admission  -Alcoholism -Self-neglect -Poor functional status ECOG 3-4 -Immobility -Severe physical deconditioning - Protein calorie malnurituion (alb < 4) at admit - mild to moderate -Heavy tobacco abuse -Chronic hypoxemic and hypercapnic respiratory failure without pulmonary support as outpatient -Baseline diastolic dysfunction grade 1   Current admission Acute on chronic hypoxemic and hypercapnic respiratory failure  with evidence of R > L pleura effusion - at ADMIT -likely due to rec and COPD exacerbation due to medication noncompliance after discharge and ongoing smoking - no eviddnece of MI - RVP pending - Worsening acute Diast CHF possible (if so present on admit)  New onset  A Fibr RVR 07/30/20  New diagnosis of Mediastinal Nodes 07/30/20 on CT byt   likely present on admit  New Hypomagnesemia mild and Hypophosphatemia moderate on 07/31/20    Plan - RUQ [place] du eto etoh and R Plr effusion and etoh hx  - Rt Thora - diagnostic and therapeutic by IR/Rad  -  lasix x 1 - Await RVP - Rx A Fib RVR - dc lopressor and hydralazine - start cardizem gtt - Change LABA to Xopenex scheduled - Continue yupelri -IV steroids to continue but increase to TID -BiPAP nightly and 2 applications during the daytim;  likely needs nocturnal ventilation at home at night at the time of discharge - Recommend DNR/Goals of care (prognrosis pooir with advanced copd, self neglect, etoh, failure to thrive and protein calorie malnurition) -Recommend PT/OT consult; might need SNF rehab at discharge -Monitor for alcohol withdrawal  - needs precedex -Needs outpatient pulmonary function test, alpha-1 antitrypsin phenotype check and tracking mediastinal nodes also outpatient pulmonologist   Best Practice (right click and "Reselect all SmartList Selections" daily)   According to the hospitalist but called brother [name] [phone] and LMTCB 10:28 AM        ATTESTATION & SIGNATURE   The patient DELAILA GIARRAPUTO is critically ill with multiple organ systems failure and requires high complexity decision making for assessment and support, frequent evaluation and titration of therapies, application of advanced monitoring technologies and extensive interpretation of multiple databases.   Critical Care Time devoted to patient care services described in this note is  35  Minutes. This time reflects time of care of this signee Dr Brand Males. This critical care time does not reflect procedure time, or teaching time or supervisory time of PA/NP/Med student/Med Resident etc but could involve care discussion time     Dr. Brand Males, M.D., [facility].C.P Pulmonary and Critical Care Medicine Staff Physician [place] [facility] Pager: [phone], If no answer or between  15:00h - 7:00h: call [phone]  07/31/2020 9:57 AM

## 2020-07-31 NOTE — Progress Notes (Addendum)
0830 Pt's rhythm now Afib, rate in the 120s-160s. Pt sitting up in bed eating breakfast, asymptomatic. WCTM.   Lilian.Ebbing RN paged MD, Dr. [place] as pt is still in Afib. New orders received for lopressor.   [id number] & [id number] Lopressor given per MAR. Pt's still maintaining Afib, rate in the 110s. Dr. [place] paged for orders, [facility]   0900 Pt c/o increasing SOB, placed pt back on Bipap. Tolerating well.  1000 New orders received for cardizem gtt.   1005 Pt convert to NSR, rate in 70s. Per Dr. [place] will start PO cardizem.   33 Pt asked to be taken off Bipap, will place on HFNC, updated patient with new orders and POC. WCTM.   1400 Pt had bedside thoracentesis performed.   1500 Pt assisted back into bed, pt now getting US performed. Pt tolerating 8L HFNC well, sats in the high 90s. RN WCTM and attempt to wean O2.

## 2020-07-31 NOTE — Progress Notes (Signed)
PROGRESS NOTE    [name]  [id number] DOB: 31-May-1945 DOA: 07/29/2020 PCP: Lujean Amel, MD    Brief Narrative:  [name] is a 75 year old female with past medical history significant for essential hypertension, type 2 diabetes mellitus, chronic diastolic congestive heart failure, paroxysmal atrial fibrillation not on anticoagulation, COPD on 4 L home O2, EtOH/tobacco use disorder who presented to [facility] ED on 7/26 via EMS with lethargy, confusion and shortness of breath.  Apparently, patient's brother called EMS after knowing that she seemed weaker and more lethargic.  Patient denied chest pain, no abdominal pain.  She was recently admitted from 7/19-7/25 with acute on chronic respiratory failure with hypoxia and hypercapnia felt to be secondary to COPD exacerbation and right lower lobe pneumonia.  Patient completed antibiotic course in the hospital in which she required BiPAP initially with IV steroids that were ultimately weaned down and she was discharged home on 4 L nasal cannula.  In the ED, temperature 97.9 F, HR 79, RR 14, BP 172/55, SPO2 88% on BiPAP with FiO2 50%.  VBG with pH 7.2, PCO2 119, PO2 82.2.  Sodium 135, potassium 4.7, chloride 81, CO2 46, glucose 126, BUN 22, creatinine 0.43.  BNP 360.8.  WBC 7.0, hemoglobin 12.3, platelets 94.  COVID-19 PCR negative.  Influenza A/B PCR negative.  CT head without contrast with no acute intracranial abnormality, stable posterior fossa cystic lesion likely arachnoid cyst with minimal sinus disease.  Chest x-ray with stable cardiomegaly with bibasilar atelectasis versus infiltrate, right greater than left with small/moderate sized right pleural effusion.  Patient was placed on BiPAP in the ED, started on empiric antibiotics.  Hospital service consulted for further evaluation management of acute on chronic hypoxic/hypercarbic respiratory failure with concern for continued pneumonia, lethargy and weakness.   Assessment &  Plan:   Principal Problem:   Acute on chronic respiratory failure with hypoxia and hypercapnia (HCC) Active Problems:   Essential hypertension   COPD exacerbation (HCC)   Diabetes mellitus, type II, insulin dependent (HCC)   Alcohol use   Chronic diastolic CHF (congestive heart failure) (HCC)   PAF (paroxysmal atrial fibrillation) (HCC)   Acute on chronic respiratory failure with hypoxia and hypercapnia, POA Patient representing to the ED following recent discharge with weakness, confusion and lethargy.  Patient was found to have hypoxia and hypercapnia.  Patient is afebrile without leukocytosis.  BNP elevated 360.  Influenza A/B and COVID-19 PCR negative.  Chest x-ray concerning for bibasilar infiltrate right greater than left with right pleural effusion.  Suspect etiology multifactorial with CO2 retention, COPD exacerbation and possible continued pneumonia complicated by continued tobacco use disorder. --Continue BiPAP, wean FiO2 as able (baseline oxygen need 4 L per nasal cannula) --Recurrent admissions, chronic respiratory failure with hypercapnia, likely would benefit from trilogy ventilator vs Bipap qHS on discharge --Palliative care consultation for assistance with goals of care and medical decision making given patient's self-neglect, nonadherence to medical therapies and recurrent admissions  COPD exacerbation Recently discharged for COPD exacerbation on prednisone taper.  Home regimen includes Trelegy Ellipta 1 puff daily.  Patient afebrile without leukocytosis, procalcitonin negative.  Discontinued cefepime. --PCCM following, appreciate assistance --Respiratory PCR panel: Pending --Yuperlri neb 140mg daily --Xopenex neb q4h scheduled and q2h prn shortness of breath/wheezing --Solu-Medrol 60 mg IV every 8 hours --Continue BiPAP twice daily and nocturnally  Paroxysmal atrial fibrillation with RVR Not on anticoagulation outpatient. --Cardizem '30mg'$  PO q6h --Continue monitor on  telemetry  Chronic diastolic congestive heart failure BNP elevated 360  on admission, chest x-ray with small moderate right pleural effusion, no significant pulmonary edema.  Not on diuretics at home.  Previous TTE 09/22/2019 with LVEF [id number], grade 1 diastolic dysfunction, no valvular abnormalities with normal size IVC. --TTE: Pending --Lasix 20 mg IV x1 --Strict I's and O's and daily weights  Bilateral pleural effusions, right greater than left CT high-resolution chest with moderate right and small left pleural effusion with extensive atelectasis, suspect from her underlying diastolic congestive heart failure. --IR for thoracentesis  Pretracheal/hilar lymph nodes High resolution CT chest on 7/27 with enlarged pretracheal and right hilar lymph nodes.  Essential hypertension --Cardizem 30 mg PO q6h  Type 2 diabetes mellitus Hemoglobin A1c 6 5.8 on 07/30/2020, well controlled.  Home regimen includes metformin 500 mg p.o. twice daily, Jardiance. --Sensitive SSI for further coverage --CBG before every meal/at bedtime  Depression --Paroxetine 20 mg p.o. daily  EtOH abuse disorder --CIWAA protocol --Folic acid, multivitamin, thiamine  Tobacco use disorder Counseled on need for complete cessation. --Nicotine patch  Intertrigo --Fluconazole 100 mg p.o. daily x7 days, topical nystatin 3 times daily  Hypomagnesemia Hypophosphatemia Repeating today. --Repeat electrolytes in the a.m.  Acute urinary retention Bladder scan with >957m retained urine on 7/27, this morning s/p In-N-Out catheterization with repeated urinary retention noted and status post Foley catheter 7/27. --Will attempt voiding trial when more mobile  Weakness/deconditioning/debility: Patient with recurrent hospitalizations, likely secondary to her chronic respiratory failure with acute exacerbations and suspicion for medication noncompliance.  Was seen by physical therapy and Occupational Therapy with recommendations  of SNF placement. --Continue therapy while inpatient --TOC for evaluation and work-up for placement  Goals of care: Patient with recurrent hospitalization with medical noncompliance and continued EtOH/tobacco abuse at home.  Patient refers all of her questions to her brother, unfortunately he did not answer telephone today.  Given her severe comorbidities, advanced age and significant COPD, will obtain palliative care consult for assistance with goals of care and medical decision making.  Given her self-neglect, may be more amenable to a hospice situation.    DVT prophylaxis: enoxaparin (LOVENOX) injection 40 mg Start: 07/30/20 1000   Code Status: Full Code Family Communication: No family present at bedside this morning, attempted to contact patient's brother, [name] via telephone, unsuccessful with no answer  Disposition Plan:  Level of care: Stepdown Status is: Inpatient  Remains inpatient appropriate because:Altered mental status, Ongoing diagnostic testing needed not appropriate for outpatient work up, Unsafe d/c plan, IV treatments appropriate due to intensity of illness or inability to take PO, and Inpatient level of care appropriate due to severity of illness  Dispo: The patient is from: Home              Anticipated d/c is to: SNF              Patient currently is not medically stable to d/c.   Difficult to place patient No  Consultants:  PCCM Palliative care  Procedures:  BiPAP Thoracentesis: Pending  Antimicrobials:  Cefepime 7/27>> Fluconazole 7/27>>    Subjective: Patient seen examined bedside, on 10 L high flow nasal cannula this morning.  Replaced on BiPAP due to increased work of breathing.  Attempted to contact patient's brother, [name] via telephone unsuccessful with no answer.  PCCM ordering IR thoracentesis.  Given her significant comorbidities, recurrent EtOH/tobacco use and self-neglect, prognosis extremely guarded and will consult palliative care for further  evaluation and assistance with goals of care and medical decision making.  Patient also noted to  be in A. fib with RVR this morning, received IV/p.o. metoprolol with conversion, starting now on oral Cardizem. Patient denies headache, no chest pain, no palpitations, no abdominal pain.  No acute events overnight per nursing staff.  Objective: Vitals:   07/31/20 0600 07/31/20 0700 07/31/20 0733 07/31/20 0800  BP: (!) 174/68 (!) 188/57  (!) 143/65  Pulse: 71 72 79 (!) 105  Resp: 20 20 (!) 27 (!) 24  Temp:    98.1 F (36.7 C)  TempSrc:    Oral  SpO2: 94% 94% 97% 93%  Weight:      Height:        Intake/Output Summary (Last 24 hours) at 07/31/2020 1008 Last data filed at 07/31/2020 0442 Gross per 24 hour  Intake 197.82 ml  Output 2060 ml  Net -1862.18 ml   Filed Weights   07/29/20 1720 07/30/20 0201  Weight: 87 kg 90.3 kg    Examination:  General exam: Mild respiratory distress on BiPAP, chronically ill in appearance Respiratory system: Coarse breath sounds bilaterally, slightly decreased bilateral bases, slightly increased respiratory effort, 10 L high flow nasal cannula Cardiovascular system: S1 & S2 heard, tachycardic, irregularly irregular rhythm. No JVD, murmurs, rubs, gallops or clicks. No pedal edema. Gastrointestinal system: Abdomen is nondistended, soft and nontender. No organomegaly or masses felt. Normal bowel sounds heard. Central nervous system: Alert and oriented. No focal neurological deficits. Extremities: Symmetric 5 x 5 power. Skin: Lacy rash noted groin, otherwise no rashes, lesions or ulcers Psychiatry: Judgement and insight appear poor. Mood & affect appropriate.     Data Reviewed: I have personally reviewed following labs and imaging studies  CBC: Recent Labs  Lab 07/29/20 1947 07/30/20 0653 07/31/20 0245  WBC 7.0 7.1 4.0  NEUTROABS 5.6  --   --   HGB 12.3 12.1 11.4*  HCT 39.7 38.2 34.4*  MCV 107.3* 105.2* 100.3*  PLT 94* 92* [id number]*   Basic Metabolic  Panel: Recent Labs  Lab 07/27/20 1521 07/28/20 0429 07/29/20 1947 07/30/20 0500 07/30/20 0653 07/31/20 0245  NA 130* 129* 135  --  133* 131*  K 5.4* 4.5 4.7  --  4.7 4.1  CL 84* 84* 81*  --  80* 82*  CO2 39* 38* 46*  --  44* 39*  GLUCOSE 235* 117* 126*  --  102* 148*  BUN 44* 36* 22  --  20 26*  CREATININE 0.78 0.64 0.43*  --  0.39* 0.53  CALCIUM 8.8* 8.6* 9.2  --  9.1 8.9  MG  --   --   --  1.8  --  1.8  PHOS  --   --   --  2.4*  --  1.7*   GFR: Estimated Creatinine Clearance: 72.6 mL/min (by C-G formula based on SCr of 0.53 mg/dL). Liver Function Tests: Recent Labs  Lab 07/29/20 1947 07/31/20 0245  AST 21 16  ALT 24 20  ALKPHOS 58 53  BILITOT 0.6 0.7  PROT 6.0* 5.5*  ALBUMIN 3.4* 3.1*   No results for input(s): LIPASE, AMYLASE in the last 168 hours. No results for input(s): AMMONIA in the last 168 hours. Coagulation Profile: No results for input(s): INR, PROTIME in the last 168 hours. Cardiac Enzymes: No results for input(s): CKTOTAL, CKMB, CKMBINDEX, TROPONINI in the last 168 hours. BNP (last 3 results) No results for input(s): PROBNP in the last 8760 hours. HbA1C: Recent Labs    07/30/20 0653  HGBA1C 5.8*   CBG: Recent Labs  Lab 07/30/20 1523 07/30/20 2051  07/30/20 2349 07/31/20 0438 07/31/20 0825  GLUCAP 194* 307* 159* 177* 162*   Lipid Profile: No results for input(s): CHOL, HDL, LDLCALC, TRIG, CHOLHDL, LDLDIRECT in the last 72 hours. Thyroid Function Tests: No results for input(s): TSH, T4TOTAL, FREET4, T3FREE, THYROIDAB in the last 72 hours. Anemia Panel: No results for input(s): VITAMINB12, FOLATE, FERRITIN, TIBC, IRON, RETICCTPCT in the last 72 hours. Sepsis Labs: Recent Labs  Lab 07/30/20 1049 07/30/20 1235 07/31/20 0245  PROCALCITON  --  <0.10 <0.10  LATICACIDVEN 2.9*  --   --     Recent Results (from the past 240 hour(s))  Resp Panel by RT-PCR (Flu A&B, Covid) Nasopharyngeal Swab     Status: None   Collection Time: 07/22/20   9:25 AM   Specimen: Nasopharyngeal Swab; Nasopharyngeal(NP) swabs in vial transport medium  Result Value Ref Range Status   SARS Coronavirus 2 by RT PCR NEGATIVE NEGATIVE Final    Comment: (NOTE) SARS-CoV-2 target nucleic acids are NOT DETECTED.  The SARS-CoV-2 RNA is generally detectable in upper respiratory specimens during the acute phase of infection. The lowest concentration of SARS-CoV-2 viral copies this assay can detect is 138 copies/mL. A negative result does not preclude SARS-Cov-2 infection and should not be used as the sole basis for treatment or other patient management decisions. A negative result may occur with  improper specimen collection/handling, submission of specimen other than nasopharyngeal swab, presence of viral mutation(s) within the areas targeted by this assay, and inadequate number of viral copies(<138 copies/mL). A negative result must be combined with clinical observations, patient history, and epidemiological information. The expected result is Negative.  Fact Sheet for Patients:  EntrepreneurPulse.com.au  Fact Sheet for Healthcare Providers:  IncredibleEmployment.be  This test is no t yet approved or cleared by the [place] FDA and  has been authorized for detection and/or diagnosis of SARS-CoV-2 by FDA under an Emergency Use Authorization (EUA). This EUA will remain  in effect (meaning this test can be used) for the duration of the COVID-19 declaration under Section 564(b)(1) of the Act, 21 U.S.C.section 360bbb-3(b)(1), unless the authorization is terminated  or revoked sooner.       Influenza A by PCR NEGATIVE NEGATIVE Final   Influenza B by PCR NEGATIVE NEGATIVE Final    Comment: (NOTE) The Xpert Xpress SARS-CoV-2/FLU/RSV plus assay is intended as an aid in the diagnosis of influenza from Nasopharyngeal swab specimens and should not be used as a sole basis for treatment. Nasal washings and aspirates  are unacceptable for Xpert Xpress SARS-CoV-2/FLU/RSV testing.  Fact Sheet for Patients: EntrepreneurPulse.com.au  Fact Sheet for Healthcare Providers: IncredibleEmployment.be  This test is not yet approved or cleared by the [place] FDA and has been authorized for detection and/or diagnosis of SARS-CoV-2 by FDA under an Emergency Use Authorization (EUA). This EUA will remain in effect (meaning this test can be used) for the duration of the COVID-19 declaration under Section 564(b)(1) of the Act, 21 U.S.C. section 360bbb-3(b)(1), unless the authorization is terminated or revoked.  Performed at [facility], [place] [address] [place], [place] [address]   Culture, blood (routine x 2)     Status: Abnormal   Collection Time: 07/22/20  9:50 AM   Specimen: BLOOD  Result Value Ref Range Status   Specimen Description   Final    BLOOD LEFT ANTECUBITAL Performed at [place] [address] [place], [place] [address]    Special Requests   Final    BOTTLES DRAWN  AEROBIC AND ANAEROBIC Blood Culture results may not be optimal due to an inadequate volume of blood received in culture bottles Performed at [facility], [place] [address], [place], [place] [address]    Culture  Setup Time   Final    GRAM POSITIVE COCCI IN BOTH AEROBIC AND ANAEROBIC BOTTLES CRITICAL RESULT CALLED TO, READ BACK BY AND VERIFIED WITH: PHARMD T GREEN [id number] AT 69 AM BY CM    Culture (A)  Final    STAPHYLOCOCCUS EPIDERMIDIS STAPHYLOCOCCUS HOMINIS THE SIGNIFICANCE OF ISOLATING THIS ORGANISM FROM A SINGLE SET OF BLOOD CULTURES WHEN MULTIPLE SETS ARE DRAWN IS UNCERTAIN. PLEASE NOTIFY THE MICROBIOLOGY DEPARTMENT WITHIN ONE WEEK IF SPECIATION AND SENSITIVITIES ARE REQUIRED. Performed at [facility], [place] [address], [place], [place] [address]    Report Status 07/26/2020 FINAL  Final  Blood Culture ID Panel (Reflexed)      Status: Abnormal   Collection Time: 07/22/20  9:50 AM  Result Value Ref Range Status   Enterococcus faecalis NOT DETECTED NOT DETECTED Final   Enterococcus Faecium NOT DETECTED NOT DETECTED Final   Listeria monocytogenes NOT DETECTED NOT DETECTED Final   Staphylococcus species DETECTED (A) NOT DETECTED Final    Comment: CRITICAL RESULT CALLED TO, READ BACK BY AND VERIFIED WITH: PHARMD T GREEN [id number] AT 721 AM BY CM    Staphylococcus aureus (BCID) NOT DETECTED NOT DETECTED Final   Staphylococcus epidermidis DETECTED (A) NOT DETECTED Final    Comment: Methicillin (oxacillin) resistant coagulase negative staphylococcus. Possible blood culture contaminant (unless isolated from more than one blood culture draw or clinical case suggests pathogenicity). No antibiotic treatment is indicated for blood  culture contaminants. CRITICAL RESULT CALLED TO, READ BACK BY AND VERIFIED WITH: PHARMD T GREEN [id number] AT 721 AM BY CM    Staphylococcus lugdunensis NOT DETECTED NOT DETECTED Final   Streptococcus species NOT DETECTED NOT DETECTED Final   Streptococcus agalactiae NOT DETECTED NOT DETECTED Final   Streptococcus pneumoniae NOT DETECTED NOT DETECTED Final   Streptococcus pyogenes NOT DETECTED NOT DETECTED Final   A.calcoaceticus-baumannii NOT DETECTED NOT DETECTED Final   Bacteroides fragilis NOT DETECTED NOT DETECTED Final   Enterobacterales NOT DETECTED NOT DETECTED Final   Enterobacter cloacae complex NOT DETECTED NOT DETECTED Final   Escherichia coli NOT DETECTED NOT DETECTED Final   Klebsiella aerogenes NOT DETECTED NOT DETECTED Final   Klebsiella oxytoca NOT DETECTED NOT DETECTED Final   Klebsiella pneumoniae NOT DETECTED NOT DETECTED Final   Proteus species NOT DETECTED NOT DETECTED Final   Salmonella species NOT DETECTED NOT DETECTED Final   Serratia marcescens NOT DETECTED NOT DETECTED Final   Haemophilus influenzae NOT DETECTED NOT DETECTED Final   Neisseria meningitidis NOT DETECTED  NOT DETECTED Final   Pseudomonas aeruginosa NOT DETECTED NOT DETECTED Final   Stenotrophomonas maltophilia NOT DETECTED NOT DETECTED Final   Candida albicans NOT DETECTED NOT DETECTED Final   Candida auris NOT DETECTED NOT DETECTED Final   Candida glabrata NOT DETECTED NOT DETECTED Final   Candida krusei NOT DETECTED NOT DETECTED Final   Candida parapsilosis NOT DETECTED NOT DETECTED Final   Candida tropicalis NOT DETECTED NOT DETECTED Final   Cryptococcus neoformans/gattii NOT DETECTED NOT DETECTED Final   Methicillin resistance mecA/C DETECTED (A) NOT DETECTED Final    Comment: CRITICAL RESULT CALLED TO, READ BACK BY AND VERIFIED WITH: T GREEN [id number] AT 721 AM BY CM Performed at [facility], [address], [place],  [address]   Culture,  blood (routine x 2)     Status: None   Collection Time: 07/22/20 11:05 AM   Specimen: BLOOD  Result Value Ref Range Status   Specimen Description   Final    BLOOD RIGHT ANTECUBITAL Performed at [place] [address] [place], [place] [address]    Special Requests   Final    BOTTLES DRAWN AEROBIC AND ANAEROBIC Blood Culture adequate volume Performed at [place] [address] [place], [place] [address]    Culture   Final    NO GROWTH 5 DAYS Performed at [facility], [place] [address] [place], [place] [address]    Report Status 07/27/2020 FINAL  Final  MRSA Next Gen by PCR, Nasal     Status: Abnormal   Collection Time: 07/22/20  9:00 PM   Specimen: Nasal Mucosa; Nasal Swab  Result Value Ref Range Status   MRSA by PCR Next Gen DETECTED (A) NOT DETECTED Final    Comment: RESULT CALLED TO, READ BACK BY AND VERIFIED WITH: DANIELLE, RN @ 0025 ON 07/23/20 C VARNER (NOTE) The GeneXpert MRSA Assay (FDA approved for NASAL specimens only), is one component of a comprehensive MRSA colonization surveillance program. It is not intended to diagnose MRSA infection nor to guide or monitor  treatment for MRSA infections. Test performance is not FDA approved in patients less than 78 years old. Performed at [facility], [place] [address], [place], [place] [address]   Resp Panel by RT-PCR (Flu A&B, Covid) Nasopharyngeal Swab     Status: None   Collection Time: 07/29/20  8:23 PM   Specimen: Nasopharyngeal Swab; Nasopharyngeal(NP) swabs in vial transport medium  Result Value Ref Range Status   SARS Coronavirus 2 by RT PCR NEGATIVE NEGATIVE Final    Comment: (NOTE) SARS-CoV-2 target nucleic acids are NOT DETECTED.  The SARS-CoV-2 RNA is generally detectable in upper respiratory specimens during the acute phase of infection. The lowest concentration of SARS-CoV-2 viral copies this assay can detect is 138 copies/mL. A negative result does not preclude SARS-Cov-2 infection and should not be used as the sole basis for treatment or other patient management decisions. A negative result may occur with  improper specimen collection/handling, submission of specimen other than nasopharyngeal swab, presence of viral mutation(s) within the areas targeted by this assay, and inadequate number of viral copies(<138 copies/mL). A negative result must be combined with clinical observations, patient history, and epidemiological information. The expected result is Negative.  Fact Sheet for Patients:  EntrepreneurPulse.com.au  Fact Sheet for Healthcare Providers:  IncredibleEmployment.be  This test is no t yet approved or cleared by the [place] FDA and  has been authorized for detection and/or diagnosis of SARS-CoV-2 by FDA under an Emergency Use Authorization (EUA). This EUA will remain  in effect (meaning this test can be used) for the duration of the COVID-19 declaration under Section 564(b)(1) of the Act, 21 U.S.C.section 360bbb-3(b)(1), unless the authorization is terminated  or revoked sooner.       Influenza A by PCR  NEGATIVE NEGATIVE Final   Influenza B by PCR NEGATIVE NEGATIVE Final    Comment: (NOTE) The Xpert Xpress SARS-CoV-2/FLU/RSV plus assay is intended as an aid in the diagnosis of influenza from Nasopharyngeal swab specimens and should not be used as a sole basis for treatment. Nasal washings and aspirates are unacceptable for Xpert Xpress SARS-CoV-2/FLU/RSV testing.  Fact Sheet for Patients: EntrepreneurPulse.com.au  Fact Sheet for Healthcare Providers: IncredibleEmployment.be  This  test is not yet approved or cleared by the [place] and has been authorized for detection and/or diagnosis of SARS-CoV-2 by FDA under an Emergency Use Authorization (EUA). This EUA will remain in effect (meaning this test can be used) for the duration of the COVID-19 declaration under Section 564(b)(1) of the Act, 21 U.S.C. section 360bbb-3(b)(1), unless the authorization is terminated or revoked.  Performed at [facility], [place] [address], [place], [place] [address]          Radiology Studies: CT Head Wo Contrast  Result Date: 07/29/2020 CLINICAL DATA:  Mental status changes of unknown cause, lethargy. 75 year old female. EXAM: CT HEAD WITHOUT CONTRAST TECHNIQUE: Contiguous axial images were obtained from the base of the skull through the vertex without intravenous contrast. COMPARISON:  September 22, 2019. FINDINGS: Brain: No evidence of acute infarction, hemorrhage, hydrocephalus, extra-axial collection or mass lesion/mass effect. Mild atrophy and stable cystic lesion in the posterior fossa biased towards the RIGHT behind the cerebellum. Vascular: No hyperdense vessel or unexpected calcification. Skull: Normal. Negative for fracture or focal lesion. Sinuses/Orbits: Visualized paranasal sinuses and orbits with minimal LEFT maxillary and RIGHT sphenoid sinus disease. No air-fluid level. Orbits are unremarkable. Other: None IMPRESSION: 1. No  acute intracranial pathology. 2. Stable posterior fossa cystic lesion, likely a arachnoid cyst. 3. Minimal sinus disease. Electronically Signed   By: Zetta Bills M.D.   On: 07/29/2020 19:41   CT Chest High Resolution  Result Date: 07/30/2020 CLINICAL DATA:  Hypoxia, chronic respiratory failure, extreme respiratory acidosis, BiPAP EXAM: CT CHEST WITHOUT CONTRAST TECHNIQUE: Multidetector CT imaging of the chest was performed following the standard protocol without intravenous contrast. High resolution imaging of the lungs, as well as inspiratory and expiratory imaging, was performed. COMPARISON:  10/08/2016 FINDINGS: Cardiovascular: Aortic atherosclerosis. Normal heart size. Three-vessel coronary artery calcifications. No pericardial effusion. Gross enlargement of the main pulmonary artery measuring up to 4.1 cm in caliber. Mediastinum/Nodes: Markedly enlarged pretracheal and right hilar lymph nodes, largest pretracheal node measuring 2.9 x 2.1 cm (series 2, image 63). Thyroid gland, trachea, and esophagus demonstrate no significant findings. Lungs/Pleura: Moderate right, small left pleural effusions with extensive associated atelectasis or consolidation of the bilateral lung bases. There is mild interlobular septal thickening and minimal centrilobular and paraseptal emphysema. Diffuse bilateral bronchial wall thickening. No significant air trapping on expiratory phase imaging. Upper Abdomen: No acute abnormality. Musculoskeletal: No chest wall mass or suspicious bone lesions identified. There are new, although age indeterminate wedge deformities of 11, T12, and L1, with a high-grade vertebral plana deformity of T12 (series 8, image 64). IMPRESSION: 1. Assessment for fibrotic interstitial lung disease is significantly limited by the presence of pleural effusions. Within this limitation, there is no specific evidence of fibrotic interstitial lung disease. 2. Moderate right, small left pleural effusions with  extensive associated atelectasis or consolidation of the bilateral lung bases. There is mild interlobular septal thickening, most consistent with mild pulmonary edema. 3. Minimal emphysema. 4. Diffuse bilateral bronchial wall thickening, which may reflect interstitial edema and/or infectious/inflammatory bronchitis. 5. Markedly enlarged pretracheal and right hilar lymph nodes, largest pretracheal node measuring 2.9 x 2.1 cm. These are nonspecific and most likely reactive, although neoplasm is difficult to exclude. 6. Gross enlargement of the main pulmonary artery measuring up to 4.1 cm in caliber, as can be seen in pulmonary hypertension. 7. Coronary artery disease. Aortic Atherosclerosis (ICD10-I70.0) and Emphysema (ICD10-J43.9). Electronically Signed   By: Eddie Candle M.D.   On: 07/30/2020 15:13  DG Chest Port 1 View  Result Date: 07/29/2020 CLINICAL DATA:  Altered mental status. EXAM: PORTABLE CHEST 1 VIEW COMPARISON:  July 22, 2020 FINDINGS: Mild, diffusely increased lung markings are seen. Moderate to marked severity right basilar atelectasis and/or infiltrate is seen. This is very mildly decreased in severity when compared to the prior study. Mild left basilar atelectasis and/or infiltrate is also noted. There is a small to moderate size right pleural effusion. Mild to moderate severity enlargement of the cardiac silhouette is seen. There is diffuse calcification of the thoracic aorta. The visualized skeletal structures are unremarkable. IMPRESSION: 1. Stable cardiomegaly with bibasilar atelectasis and/or infiltrate, right greater than left. 2. Small to moderate sized right pleural effusion. Electronically Signed   By: Virgina Norfolk M.D.   On: 07/29/2020 19:53        Scheduled Meds:  (feeding supplement) PROSource Plus  30 mL Oral BID BM   chlorhexidine  15 mL Mouth Rinse BID   Chlorhexidine Gluconate Cloth  6 each Topical Daily   diltiazem  20 mg Intravenous Once   enoxaparin (LOVENOX)  injection  40 mg Subcutaneous Q24H   feeding supplement  237 mL Oral Q24H   fluconazole  100 mg Oral Daily   folic acid  1 mg Oral Daily   furosemide  20 mg Intravenous Once   insulin aspart  0-9 Units Subcutaneous Q4H   levalbuterol  0.63 mg Nebulization Q4H   mouth rinse  15 mL Mouth Rinse q12n4p   methylPREDNISolone (SOLU-MEDROL) injection  60 mg Intravenous Q8H   multivitamin with minerals  1 tablet Oral Daily   nystatin   Topical TID   PARoxetine  20 mg Oral Daily   revefenacin  175 mcg Nebulization Daily   thiamine  100 mg Oral Daily   Or   thiamine  100 mg Intravenous Daily   Continuous Infusions:  diltiazem (CARDIZEM) infusion     diltiazem (CARDIZEM) infusion     magnesium sulfate bolus IVPB     sodium phosphate  Dextrose 5% IVPB       LOS: 1 day    Critical Care Time Upon my evaluation, this patient had a high probability of imminent or life-threatening deterioration due to acute on chronic hypoxic respiratory failure requiring BiPAP with COPD exacerbation and pneumonia, which required my direct attention, intervention, and personal management.  I have personally provided 38 minutes of critical care time exclusive of my time spent on separately billable procedures.  Time includes review of laboratory data, radiology results, discussion with consultants, and monitoring for potential decompensation.       Brie Eppard J [place] DO [facility] Available via Epic secure chat 7am-7pm After these hours, please refer to coverage provider listed on [email or web address] 07/31/2020, 10:08 AM

## 2020-07-31 NOTE — Procedures (Signed)
PROCEDURE SUMMARY:  Successful US guided right thoracentesis. Yielded 825 mL of clear yellow fluid. Pt tolerated procedure well. No immediate complications.  Specimen was sent for labs. CXR ordered.  EBL < 5 mL  Ascencion Dike PA-C 07/31/2020 3:02 PM

## 2020-08-01 ENCOUNTER — Inpatient Hospital Stay (HOSPITAL_COMMUNITY): Payer: Medicare HMO

## 2020-08-01 DIAGNOSIS — Z515 Encounter for palliative care: Secondary | ICD-10-CM

## 2020-08-01 DIAGNOSIS — J441 Chronic obstructive pulmonary disease with (acute) exacerbation: Secondary | ICD-10-CM | POA: Diagnosis not present

## 2020-08-01 DIAGNOSIS — J9622 Acute and chronic respiratory failure with hypercapnia: Secondary | ICD-10-CM | POA: Diagnosis not present

## 2020-08-01 DIAGNOSIS — R0603 Acute respiratory distress: Secondary | ICD-10-CM | POA: Diagnosis not present

## 2020-08-01 DIAGNOSIS — I5032 Chronic diastolic (congestive) heart failure: Secondary | ICD-10-CM | POA: Diagnosis not present

## 2020-08-01 DIAGNOSIS — J9621 Acute and chronic respiratory failure with hypoxia: Secondary | ICD-10-CM | POA: Diagnosis not present

## 2020-08-01 DIAGNOSIS — Z7189 Other specified counseling: Secondary | ICD-10-CM | POA: Diagnosis not present

## 2020-08-01 LAB — CBC
HCT: 31.8 % — ABNORMAL LOW (ref 36.0–46.0)
Hemoglobin: 10.4 g/dL — ABNORMAL LOW (ref 12.0–15.0)
MCH: 32.6 pg (ref 26.0–34.0)
MCHC: 32.7 g/dL (ref 30.0–36.0)
MCV: 99.7 fL (ref 80.0–100.0)
Platelets: 120 10*3/uL — ABNORMAL LOW (ref 150–400)
RBC: 3.19 MIL/uL — ABNORMAL LOW (ref 3.87–5.11)
RDW: 12.6 % (ref 11.5–15.5)
WBC: 4.8 10*3/uL (ref 4.0–10.5)
nRBC: 0 % (ref 0.0–0.2)

## 2020-08-01 LAB — ECHOCARDIOGRAM COMPLETE
AR max vel: 1.24 cm2
AV Area VTI: 1.14 cm2
AV Area mean vel: 1.09 cm2
AV Mean grad: 12 mmHg
AV Peak grad: 19.7 mmHg
Ao pk vel: 2.22 m/s
Area-P 1/2: 2.62 cm2
Height: 68 in
S' Lateral: 2.7 cm
Weight: 3185.21 oz

## 2020-08-01 LAB — GLUCOSE, CAPILLARY
Glucose-Capillary: 153 mg/dL — ABNORMAL HIGH (ref 70–99)
Glucose-Capillary: 159 mg/dL — ABNORMAL HIGH (ref 70–99)
Glucose-Capillary: 188 mg/dL — ABNORMAL HIGH (ref 70–99)
Glucose-Capillary: 143 mg/dL — ABNORMAL HIGH (ref 70–99)
Glucose-Capillary: 261 mg/dL — ABNORMAL HIGH (ref 70–99)
Glucose-Capillary: 271 mg/dL — ABNORMAL HIGH (ref 70–99)

## 2020-08-01 LAB — RESPIRATORY PANEL BY PCR

## 2020-08-01 LAB — BASIC METABOLIC PANEL
Anion gap: 10 (ref 5–15)
BUN: 29 mg/dL — ABNORMAL HIGH (ref 8–23)
CO2: 38 mmol/L — ABNORMAL HIGH (ref 22–32)
Calcium: 8.4 mg/dL — ABNORMAL LOW (ref 8.9–10.3)
Chloride: 80 mmol/L — ABNORMAL LOW (ref 98–111)
Creatinine, Ser: 0.66 mg/dL (ref 0.44–1.00)
GFR, Estimated: >60 mL/min (ref >60)
Glucose, Bld: 145 mg/dL — ABNORMAL HIGH (ref 70–99)
Potassium: 3.7 mmol/L (ref 3.5–5.1)
Sodium: 128 mmol/L — ABNORMAL LOW (ref 135–145)

## 2020-08-01 LAB — HEPATIC FUNCTION PANEL
ALT: 23 U/L (ref 0–44)
AST: 18 U/L (ref 15–41)
Albumin: 2.9 g/dL — ABNORMAL LOW (ref 3.5–5.0)
Alkaline Phosphatase: 48 U/L (ref 38–126)
Bilirubin, Direct: 0.1 mg/dL (ref 0.0–0.2)
Indirect Bilirubin: 0.6 mg/dL (ref 0.3–0.9)
Total Bilirubin: 0.7 mg/dL (ref 0.3–1.2)
Total Protein: 5.3 g/dL — ABNORMAL LOW (ref 6.5–8.1)

## 2020-08-01 LAB — PROCALCITONIN: Procalcitonin: <0.1 ng/mL

## 2020-08-01 LAB — MAGNESIUM: Magnesium: 2 mg/dL (ref 1.7–2.4)

## 2020-08-01 LAB — CYTOLOGY - NON PAP

## 2020-08-01 LAB — PHOSPHORUS: Phosphorus: 4.2 mg/dL (ref 2.5–4.6)

## 2020-08-01 LAB — LACTATE DEHYDROGENASE: LDH: 166 U/L (ref 98–192)

## 2020-08-01 MED ORDER — LOSARTAN POTASSIUM 50 MG PO TABS
50.0000 mg | ORAL_TABLET | Freq: Once | ORAL | Status: AC
  Administered 2020-08-01: 50 mg via ORAL
  Filled 2020-08-01: qty 1

## 2020-08-01 MED ORDER — LOSARTAN POTASSIUM 50 MG PO TABS
50.0000 mg | ORAL_TABLET | Freq: Every day | ORAL | Status: DC
  Administered 2020-08-01: 50 mg via ORAL
  Filled 2020-08-01: qty 1

## 2020-08-01 MED ORDER — FUROSEMIDE 10 MG/ML IJ SOLN
20.0000 mg | Freq: Two times a day (BID) | INTRAMUSCULAR | Status: DC
  Administered 2020-08-01 – 2020-08-03 (×5): 20 mg via INTRAVENOUS
  Filled 2020-08-01 (×5): qty 2

## 2020-08-01 MED ORDER — METHYLPREDNISOLONE SODIUM SUCC 125 MG IJ SOLR
60.0000 mg | INTRAMUSCULAR | Status: DC
  Administered 2020-08-02 – 2020-08-04 (×3): 60 mg via INTRAVENOUS
  Filled 2020-08-01 (×3): qty 2

## 2020-08-01 MED ORDER — HYDRALAZINE HCL 20 MG/ML IJ SOLN
5.0000 mg | Freq: Once | INTRAMUSCULAR | Status: AC
  Administered 2020-08-01: 5 mg via INTRAVENOUS
  Filled 2020-08-01: qty 1

## 2020-08-01 MED ORDER — FUROSEMIDE 10 MG/ML IJ SOLN
40.0000 mg | Freq: Once | INTRAMUSCULAR | Status: AC
  Administered 2020-08-01: 40 mg via INTRAVENOUS
  Filled 2020-08-01: qty 4

## 2020-08-01 MED ORDER — LOSARTAN POTASSIUM 50 MG PO TABS
100.0000 mg | ORAL_TABLET | Freq: Every day | ORAL | Status: DC
  Administered 2020-08-02 – 2020-08-03 (×2): 100 mg via ORAL
  Filled 2020-08-01 (×2): qty 2

## 2020-08-01 NOTE — Progress Notes (Signed)
[facility] ([facility])  Hospital Liaison: RN note         This patient has been referred to our palliative care services in the community.  [facility] will continue to follow for any discharge planning needs and to coordinate continuation of palliative care in the outpatient setting.    If you have questions or need assistance, please call [phone] or contact the hospital Liaison listed on AMION.      Thank you for this referral.         Mary Anne Robertson, RN, CCM  [facility] Hospital Liaison   [phone] 

## 2020-08-01 NOTE — Progress Notes (Addendum)
PROGRESS NOTE    [name]  [id number] DOB: 1945/12/31 DOA: 07/29/2020 PCP: Lujean Amel, MD    Brief Narrative:  [name] is a 75 year old female with past medical history significant for essential hypertension, type 2 diabetes mellitus, chronic diastolic congestive heart failure, paroxysmal atrial fibrillation not on anticoagulation, COPD on 4 L home O2, EtOH/tobacco use disorder who presented to [facility] ED on 7/26 via EMS with lethargy, confusion and shortness of breath.  Apparently, patient's brother called EMS after knowing that she seemed weaker and more lethargic.  Patient denied chest pain, no abdominal pain.  She was recently admitted from 7/19-7/25 with acute on chronic respiratory failure with hypoxia and hypercapnia felt to be secondary to COPD exacerbation and right lower lobe pneumonia.  Patient completed antibiotic course in the hospital in which she required BiPAP initially with IV steroids that were ultimately weaned down and she was discharged home on 4 L nasal cannula.  In the ED, temperature 97.9 F, HR 79, RR 14, BP 172/55, SPO2 88% on BiPAP with FiO2 50%.  VBG with pH 7.2, PCO2 119, PO2 82.2.  Sodium 135, potassium 4.7, chloride 81, CO2 46, glucose 126, BUN 22, creatinine 0.43.  BNP 360.8.  WBC 7.0, hemoglobin 12.3, platelets 94.  COVID-19 PCR negative.  Influenza A/B PCR negative.  CT head without contrast with no acute intracranial abnormality, stable posterior fossa cystic lesion likely arachnoid cyst with minimal sinus disease.  Chest x-ray with stable cardiomegaly with bibasilar atelectasis versus infiltrate, right greater than left with small/moderate sized right pleural effusion.  Patient was placed on BiPAP in the ED, started on empiric antibiotics.  Hospital service consulted for further evaluation management of acute on chronic hypoxic/hypercarbic respiratory failure with concern for continued pneumonia, lethargy and weakness.   Assessment &  Plan:   Principal Problem:   Acute on chronic respiratory failure with hypoxia and hypercapnia (HCC) Active Problems:   Essential hypertension   COPD exacerbation (HCC)   Diabetes mellitus, type II, insulin dependent (HCC)   Alcohol use   Chronic diastolic CHF (congestive heart failure) (HCC)   PAF (paroxysmal atrial fibrillation) (HCC)   Acute on chronic respiratory failure with hypoxia and hypercapnia, POA Patient representing to the ED following recent discharge with weakness, confusion and lethargy.  Patient was found to have hypoxia and hypercapnia.  Patient is afebrile without leukocytosis.  BNP elevated 360.  Influenza A/B and COVID-19 PCR negative.  Chest x-ray concerning for bibasilar infiltrate right greater than left with right pleural effusion.  Suspect etiology multifactorial with CO2 retention, COPD exacerbation and possible continued pneumonia complicated by continued tobacco use disorder. --Continue BiPAP qHS and while sleeping during the day (baseline oxygen need 4 L per nasal cannula) --Recurrent admissions, chronic respiratory failure with hypercapnia, likely would benefit from trilogy ventilator vs Bipap qHS on discharge; TOC assisting with acquisition --Palliative care consultation for assistance with goals of care and medical decision making given patient's self-neglect, nonadherence to medical therapies and recurrent admissions  COPD exacerbation Recently discharged for COPD exacerbation on prednisone taper.  Home regimen includes Trelegy Ellipta 1 puff daily.  Patient afebrile without leukocytosis, procalcitonin negative.  Discontinued cefepime. --PCCM following, appreciate assistance --Respiratory PCR panel: Pending --Yuperlri neb 180mg daily --Xopenex neb q4h scheduled and q2h prn shortness of breath/wheezing --Solu-Medrol 60 mg IV q24h --Continue BiPAP qHS --Continue supplemental oxygen, maintain SPO2 greater than 88%, baseline 4 L per nasal cannula --Repeat  ABG in the a.m.  Paroxysmal atrial fibrillation with RVR  Not on anticoagulation outpatient. --Cardizem '30mg'$  PO q6h --Continue monitor on telemetry  Chronic diastolic congestive heart failure BNP elevated 360 on admission, chest x-ray with small moderate right pleural effusion, no significant pulmonary edema.  Not on diuretics at home.  Previous TTE 09/22/2019 with LVEF [id number], grade 1 diastolic dysfunction, no valvular abnormalities with normal size IVC. --TTE: Pending --Lasix 20 mg IV q12h --Strict I's and O's and daily weights  Bilateral pleural effusions, right greater than left CT high-resolution chest with moderate right and small left pleural effusion with extensive atelectasis, suspect from her underlying diastolic congestive heart failure.  Underwent IR right thoracentesis on 7/28 with 875 mL of clear yellow fluid removed.  Chest x-ray shows improvement of right-sided atelectasis. --Continue Lasix as above  Pretracheal/hilar lymph nodes High resolution CT chest on 7/27 with enlarged pretracheal and right hilar lymph nodes.  Essential hypertension --Cardizem 30 mg PO q6h --Losartan 50 mg p.o. daily  Type 2 diabetes mellitus Hemoglobin A1c 6 5.8 on 07/30/2020, well controlled.  Home regimen includes metformin 500 mg p.o. twice daily, Jardiance. --Sensitive SSI for further coverage --CBG before every meal/at bedtime  Depression --Paroxetine 20 mg p.o. daily  EtOH abuse disorder --CIWAA protocol --Folic acid, multivitamin, thiamine  Tobacco use disorder Counseled on need for complete cessation. --Nicotine patch  Intertrigo --Fluconazole 150 mg p.o. weekly x 4 weeks, topical nystatin 3 times daily  Hypomagnesemia Hypophosphatemia Repleted. K3.7, Mag 2.0 and Phos 4.2 today --Repeat electrolytes in the a.m.  Acute urinary retention Bladder scan with >983m retained urine on 7/27, this morning s/p In-N-Out catheterization with repeated urinary retention noted and status  post Foley catheter 7/27. --Voiding trial today  Weakness/deconditioning/debility: Patient with recurrent hospitalizations, likely secondary to her chronic respiratory failure with acute exacerbations and suspicion for medication noncompliance.  Was seen by physical therapy and Occupational Therapy with recommendations of SNF placement. --Continue therapy while inpatient --TOC for evaluation and work-up for placement  Goals of care: Patient with recurrent hospitalization with medical noncompliance and continued EtOH/tobacco abuse at home.  Patient refers all of her questions to her brother, unfortunately he did not answer telephone today.  Given her severe comorbidities, advanced age and significant COPD, will obtain palliative care consult for assistance with goals of care and medical decision making.  Given her self-neglect, may be more amenable to a hospice situation.    DVT prophylaxis: enoxaparin (LOVENOX) injection 40 mg Start: 07/30/20 1000   Code Status: Full Code Family Communication: No family present at bedside this morning.  Disposition Plan:  Level of care: Progressive Status is: Inpatient  Remains inpatient appropriate because:Altered mental status, Ongoing diagnostic testing needed not appropriate for outpatient work up, Unsafe d/c plan, IV treatments appropriate due to intensity of illness or inability to take PO, and Inpatient level of care appropriate due to severity of illness  Dispo: The patient is from: Home              Anticipated d/c is to: SNF              Patient currently is not medically stable to d/c.   Difficult to place patient No  Consultants:  PCCM Palliative care  Procedures:  BiPAP Thoracentesis: 7/28 by IR  Antimicrobials:  Cefepime 7/27 - 7/28 Fluconazole 7/27>>    Subjective: Patient seen examined bedside, resting comfortably in bed on BiPAP.  Reports good sleep overnight.  RN present.  Underwent IR thoracentesis yesterday.  Feels like  her breathing is improving slowly each  day.  Seen by PCCM this morning with de-escalation of BiPAP to nightly and okay for transfer to progressive unit.  No family present at bedside, brother does not answer phone.  Awaiting palliative care evaluation.  Heart rate controlled on telemetry.  No other questions or concerns at this time.  Denies headache, no chest pain, no abdominal pain, no cough/congestion.  No acute events overnight per nursing staff.   Objective: Vitals:   08/01/20 0600 08/01/20 0700 08/01/20 0800 08/01/20 1000  BP: (!) 161/122 (!) 166/140  (!) 175/46  Pulse: (!) 57 (!) 59  63  Resp: 14 19  (!) 23  Temp:   97.7 F (36.5 C)   TempSrc:   Axillary   SpO2: 97% 98%  96%  Weight:      Height:        Intake/Output Summary (Last 24 hours) at 08/01/2020 1019 Last data filed at 08/01/2020 0800 Gross per 24 hour  Intake 852.84 ml  Output 2145 ml  Net -1292.16 ml   Filed Weights   07/29/20 1720 07/30/20 0201  Weight: 87 kg 90.3 kg    Examination:  General exam: Mild respiratory distress on BiPAP, chronically ill in appearance Respiratory system: Coarse breath sounds bilaterally, slightly decreased bilateral bases, normal respiratory effort, on BiPAP with FiO2 45% Cardiovascular system: S1 & S2 heard, irregularly irregular rhythm, normal rate. No JVD, murmurs, rubs, gallops or clicks. No pedal edema. Gastrointestinal system: Abdomen is nondistended, soft and nontender. No organomegaly or masses felt. Normal bowel sounds heard. Central nervous system: Alert and oriented. No focal neurological deficits. Extremities: Symmetric 5 x 5 power. Skin: Lacy rash noted groin, otherwise no rashes, lesions or ulcers Psychiatry: Judgement and insight appear poor. Mood & affect appropriate.     Data Reviewed: I have personally reviewed following labs and imaging studies  CBC: Recent Labs  Lab 07/29/20 1947 07/30/20 0653 07/31/20 0245 08/01/20 0242  WBC 7.0 7.1 4.0 4.8   NEUTROABS 5.6  --   --   --   HGB 12.3 12.1 11.4* 10.4*  HCT 39.7 38.2 34.4* 31.8*  MCV 107.3* 105.2* 100.3* 99.7  PLT 94* 92* 106* [id number]*   Basic Metabolic Panel: Recent Labs  Lab 07/28/20 0429 07/29/20 1947 07/30/20 0500 07/30/20 0653 07/31/20 0245 08/01/20 0242  NA 129* 135  --  133* 131* 128*  K 4.5 4.7  --  4.7 4.1 3.7  CL 84* 81*  --  80* 82* 80*  CO2 38* 46*  --  44* 39* 38*  GLUCOSE 117* 126*  --  102* 148* 145*  BUN 36* 22  --  20 26* 29*  CREATININE 0.64 0.43*  --  0.39* 0.53 0.66  CALCIUM 8.6* 9.2  --  9.1 8.9 8.4*  MG  --   --  1.8  --  1.8 2.0  PHOS  --   --  2.4*  --  1.7* 4.2   GFR: Estimated Creatinine Clearance: 72.6 mL/min (by C-G formula based on SCr of 0.66 mg/dL). Liver Function Tests: Recent Labs  Lab 07/29/20 1947 07/31/20 0245 08/01/20 0242  AST '21 16 18  '$ ALT '24 20 23  '$ ALKPHOS 58 53 48  BILITOT 0.6 0.7 0.7  PROT 6.0* 5.5* 5.3*  ALBUMIN 3.4* 3.1* 2.9*   No results for input(s): LIPASE, AMYLASE in the last 168 hours. No results for input(s): AMMONIA in the last 168 hours. Coagulation Profile: No results for input(s): INR, PROTIME in the last 168 hours. Cardiac Enzymes: No results  for input(s): CKTOTAL, CKMB, CKMBINDEX, TROPONINI in the last 168 hours. BNP (last 3 results) No results for input(s): PROBNP in the last 8760 hours. HbA1C: Recent Labs    07/30/20 0653  HGBA1C 5.8*   CBG: Recent Labs  Lab 07/31/20 1621 07/31/20 1931 07/31/20 2323 08/01/20 0339 08/01/20 0751  GLUCAP 256* 209* 177* 153* 159*   Lipid Profile: No results for input(s): CHOL, HDL, LDLCALC, TRIG, CHOLHDL, LDLDIRECT in the last 72 hours. Thyroid Function Tests: No results for input(s): TSH, T4TOTAL, FREET4, T3FREE, THYROIDAB in the last 72 hours. Anemia Panel: No results for input(s): VITAMINB12, FOLATE, FERRITIN, TIBC, IRON, RETICCTPCT in the last 72 hours. Sepsis Labs: Recent Labs  Lab 07/30/20 1049 07/30/20 1235 07/31/20 0245 08/01/20 0242   PROCALCITON  --  <0.10 <0.10 <0.10  LATICACIDVEN 2.9*  --   --   --     Recent Results (from the past 240 hour(s))  Culture, blood (routine x 2)     Status: None   Collection Time: 07/22/20 11:05 AM   Specimen: BLOOD  Result Value Ref Range Status   Specimen Description   Final    BLOOD RIGHT ANTECUBITAL Performed at [facility], [place] [address] [place], [place] [address]    Special Requests   Final    BOTTLES DRAWN AEROBIC AND ANAEROBIC Blood Culture adequate volume Performed at [place] [address], [place], [place] [address]    Culture   Final    NO GROWTH 5 DAYS Performed at [facility], [place] [address] [place], [place] [address]    Report Status 07/27/2020 FINAL  Final  MRSA Next Gen by PCR, Nasal     Status: Abnormal   Collection Time: 07/22/20  9:00 PM   Specimen: Nasal Mucosa; Nasal Swab  Result Value Ref Range Status   MRSA by PCR Next Gen DETECTED (A) NOT DETECTED Final    Comment: RESULT CALLED TO, READ BACK BY AND VERIFIED WITH: DANIELLE, RN @ 0025 ON 07/23/20 C VARNER (NOTE) The GeneXpert MRSA Assay (FDA approved for NASAL specimens only), is one component of a comprehensive MRSA colonization surveillance program. It is not intended to diagnose MRSA infection nor to guide or monitor treatment for MRSA infections. Test performance is not FDA approved in patients less than 36 years old. Performed at [facility], [place] [address] [place], [place] [address]   Resp Panel by RT-PCR (Flu A&B, Covid) Nasopharyngeal Swab     Status: None   Collection Time: 07/29/20  8:23 PM   Specimen: Nasopharyngeal Swab; Nasopharyngeal(NP) swabs in vial transport medium  Result Value Ref Range Status   SARS Coronavirus 2 by RT PCR NEGATIVE NEGATIVE Final    Comment: (NOTE) SARS-CoV-2 target nucleic acids are NOT DETECTED.  The SARS-CoV-2 RNA is generally detectable in upper respiratory specimens during the  acute phase of infection. The lowest concentration of SARS-CoV-2 viral copies this assay can detect is 138 copies/mL. A negative result does not preclude SARS-Cov-2 infection and should not be used as the sole basis for treatment or other patient management decisions. A negative result may occur with  improper specimen collection/handling, submission of specimen other than nasopharyngeal swab, presence of viral mutation(s) within the areas targeted by this assay, and inadequate number of viral copies(<138 copies/mL). A negative result must be combined with clinical observations, patient history, and epidemiological information. The expected result is Negative.  Fact Sheet for Patients:  EntrepreneurPulse.com.au  Fact Sheet for Healthcare  Providers:  IncredibleEmployment.be  This test is no t yet approved or cleared by the [place] and  has been authorized for detection and/or diagnosis of SARS-CoV-2 by FDA under an Emergency Use Authorization (EUA). This EUA will remain  in effect (meaning this test can be used) for the duration of the COVID-19 declaration under Section 564(b)(1) of the Act, 21 U.S.C.section 360bbb-3(b)(1), unless the authorization is terminated  or revoked sooner.       Influenza A by PCR NEGATIVE NEGATIVE Final   Influenza B by PCR NEGATIVE NEGATIVE Final    Comment: (NOTE) The Xpert Xpress SARS-CoV-2/FLU/RSV plus assay is intended as an aid in the diagnosis of influenza from Nasopharyngeal swab specimens and should not be used as a sole basis for treatment. Nasal washings and aspirates are unacceptable for Xpert Xpress SARS-CoV-2/FLU/RSV testing.  Fact Sheet for Patients: EntrepreneurPulse.com.au  Fact Sheet for Healthcare Providers: IncredibleEmployment.be  This test is not yet approved or cleared by the [place] FDA and has been authorized for detection and/or  diagnosis of SARS-CoV-2 by FDA under an Emergency Use Authorization (EUA). This EUA will remain in effect (meaning this test can be used) for the duration of the COVID-19 declaration under Section 564(b)(1) of the Act, 21 U.S.C. section 360bbb-3(b)(1), unless the authorization is terminated or revoked.  Performed at [facility], [place] [address], [place], [place] [address]   Body fluid culture w Gram Stain     Status: None (Preliminary result)   Collection Time: 07/31/20  1:50 PM   Specimen: Pleura; Body Fluid  Result Value Ref Range Status   Specimen Description   Final    PLEURAL RIGHT Performed at [facility] [address], [place], [place] [address]    Special Requests   Final    Normal Performed at [facility], [place] [address], [place], [place] [address]    Gram Stain   Final    RARE WBC PRESENT, PREDOMINANTLY MONONUCLEAR NO ORGANISMS SEEN    Culture   Final    NO GROWTH < 24 HOURS Performed at [facility], [place] [address], [place], [place] [address]    Report Status PENDING  Incomplete         Radiology Studies: CT Chest High Resolution  Result Date: 07/30/2020 CLINICAL DATA:  Hypoxia, chronic respiratory failure, extreme respiratory acidosis, BiPAP EXAM: CT CHEST WITHOUT CONTRAST TECHNIQUE: Multidetector CT imaging of the chest was performed following the standard protocol without intravenous contrast. High resolution imaging of the lungs, as well as inspiratory and expiratory imaging, was performed. COMPARISON:  10/08/2016 FINDINGS: Cardiovascular: Aortic atherosclerosis. Normal heart size. Three-vessel coronary artery calcifications. No pericardial effusion. Gross enlargement of the main pulmonary artery measuring up to 4.1 cm in caliber. Mediastinum/Nodes: Markedly enlarged pretracheal and right hilar lymph nodes, largest pretracheal node measuring 2.9 x 2.1 cm (series 2, image 63). Thyroid gland,  trachea, and esophagus demonstrate no significant findings. Lungs/Pleura: Moderate right, small left pleural effusions with extensive associated atelectasis or consolidation of the bilateral lung bases. There is mild interlobular septal thickening and minimal centrilobular and paraseptal emphysema. Diffuse bilateral bronchial wall thickening. No significant air trapping on expiratory phase imaging. Upper Abdomen: No acute abnormality. Musculoskeletal: No chest wall mass or suspicious bone lesions identified. There are new, although age indeterminate wedge deformities of 11, T12, and L1, with a high-grade vertebral plana deformity of T12 (series 8, image 64). IMPRESSION: 1. Assessment for fibrotic interstitial lung disease is  significantly limited by the presence of pleural effusions. Within this limitation, there is no specific evidence of fibrotic interstitial lung disease. 2. Moderate right, small left pleural effusions with extensive associated atelectasis or consolidation of the bilateral lung bases. There is mild interlobular septal thickening, most consistent with mild pulmonary edema. 3. Minimal emphysema. 4. Diffuse bilateral bronchial wall thickening, which may reflect interstitial edema and/or infectious/inflammatory bronchitis. 5. Markedly enlarged pretracheal and right hilar lymph nodes, largest pretracheal node measuring 2.9 x 2.1 cm. These are nonspecific and most likely reactive, although neoplasm is difficult to exclude. 6. Gross enlargement of the main pulmonary artery measuring up to 4.1 cm in caliber, as can be seen in pulmonary hypertension. 7. Coronary artery disease. Aortic Atherosclerosis (ICD10-I70.0) and Emphysema (ICD10-J43.9). Electronically Signed   By: Eddie Candle M.D.   On: 07/30/2020 15:13   DG Chest Port 1 View  Result Date: 07/31/2020 CLINICAL DATA:  Status post right thoracentesis. EXAM: PORTABLE CHEST 1 VIEW COMPARISON:  07/29/2020 FINDINGS: Unchanged mild cardiomegaly. No  significant pulmonary vascular congestion. Interval decrease of right pleural effusion. No pneumothorax is identified. Small left pleural effusion is unchanged. IMPRESSION: No pneumothorax status post right thoracentesis. Electronically Signed   By: Miachel Roux M.D.   On: 07/31/2020 17:00   US Abdomen Limited RUQ (LIVER/GB)  Result Date: 07/31/2020 CLINICAL DATA:  75 year old female with history of alcoholism and concern for cirrhosis. EXAM: ULTRASOUND ABDOMEN LIMITED RIGHT UPPER QUADRANT COMPARISON:  CT abdomen pelvis dated 08/24/2017. FINDINGS: Gallbladder: The gallbladder is not visualized, likely contracted or surgically absent. Clinical correlation is recommended. Common bile duct: Diameter: 6 mm Liver: The liver demonstrates a slightly increased echogenicity with mildly coarsened echotexture which may represent mild fatty infiltration. Early fibrosis is not excluded. Elastography may provide better evaluation. Portal vein is patent on color Doppler imaging with normal direction of blood flow towards the liver. Other: None. IMPRESSION: Probable mild fatty liver and findings concerning for early fibrosis. Elastography may provide better evaluation if clinically indicated. Electronically Signed   By: Anner Crete M.D.   On: 07/31/2020 16:56   US THORACENTESIS ASP PLEURAL SPACE W/IMG GUIDE  Result Date: 07/31/2020 INDICATION: Shortness of breath. Right-sided pleural effusion. Request for diagnostic and therapeutic thoracentesis. EXAM: ULTRASOUND GUIDED RIGHT THORACENTESIS MEDICATIONS: 1% plain lidocaine, 7 mL COMPLICATIONS: None immediate. PROCEDURE: An ultrasound guided thoracentesis was thoroughly discussed with the patient and questions answered. The benefits, risks, alternatives and complications were also discussed. The patient understands and wishes to proceed with the procedure. Written consent was obtained. Ultrasound was performed to localize and mark an adequate pocket of fluid in the right  chest. The area was then prepped and draped in the normal sterile fashion. 1% Lidocaine was used for local anesthesia. Under ultrasound guidance a 6 Fr Safe-T-Centesis catheter was introduced. Thoracentesis was performed. The catheter was removed and a dressing applied. FINDINGS: A total of approximately 825 mL of clear yellow fluid was removed. Samples were sent to the laboratory as requested by the clinical team. IMPRESSION: Successful ultrasound guided right thoracentesis yielding 825 mL of pleural fluid. Read by: Ascencion Dike PA-C Electronically Signed   By: Miachel Roux M.D.   On: 07/31/2020 15:33        Scheduled Meds:  (feeding supplement) PROSource Plus  30 mL Oral BID BM   chlorhexidine  15 mL Mouth Rinse BID   Chlorhexidine Gluconate Cloth  6 each Topical Daily   diltiazem  30 mg Oral Q6H   enoxaparin (LOVENOX) injection  40 mg Subcutaneous Q24H   feeding supplement  237 mL Oral Q24H   [START ON 08/07/2020] fluconazole  150 mg Oral Weekly   folic acid  1 mg Oral Daily   furosemide  20 mg Intravenous Q12H   furosemide  40 mg Intravenous Once   insulin aspart  0-9 Units Subcutaneous Q4H   insulin aspart  3 Units Subcutaneous TID WC   insulin glargine-yfgn  10 Units Subcutaneous Daily   levalbuterol  0.63 mg Nebulization Q4H   losartan  50 mg Oral Daily   mouth rinse  15 mL Mouth Rinse q12n4p   [START ON 08/02/2020] methylPREDNISolone (SOLU-MEDROL) injection  60 mg Intravenous Q24H   multivitamin with minerals  1 tablet Oral Daily   nystatin   Topical TID   PARoxetine  20 mg Oral Daily   revefenacin  175 mcg Nebulization Daily   thiamine  100 mg Oral Daily   Or   thiamine  100 mg Intravenous Daily   Continuous Infusions:     LOS: 2 days    Time spent: 42 minutes spent on chart review, discussion with nursing staff, consultants, updating family and interview/physical exam; more than 50% of that time was spent in counseling and/or coordination of care.     Breck [place] J  [place] DO [facility] Available via Epic secure chat 7am-7pm After these hours, please refer to coverage provider listed on [email or web address] 08/01/2020, 10:19 AM

## 2020-08-01 NOTE — TOC Progression Note (Signed)
Transition of Care [facility]) - Progression Note    Patient Details  Name: GUNHILD BURDINE MRN: [id number] Date of Birth: Dec 16, 1945  Transition of Care [facility]) CM/SW Contact  Leeroy Cha, RN Phone Number: 08/01/2020, 10:00 AM  Clinical Narrative:    Ms. Pillion continues to exhibit signs of acute on chronic hypoxemic and hypercapnic respiratory failure secondary to COPD.  The use of the NIV will treat patient's high PC02 levels (72.9 on 07/23/20 with elevated bicarbonate of 41.4) and can reduce risk of exacerbations and future hospitalizations (this is her second admission this month) when used at night and during the day.  All alternate devices [phone] and [id number]) have been proven ineffective to provide essential volume control necessary to maintain acceptable CO2 levels. An NIV with AVAPS AE is necessary to prevent patient harm.  Interruption or failure to provide NIV would quickly lead to exacerbation of the patient's condition, hospital admission, and likely harm to the patient. Continued use is preferred.  Patient is able to maintain airway and clear secretions.    Expected Discharge Plan: [place] Barriers to Discharge: Continued Medical Work up  Expected Discharge Plan and Services Expected Discharge Plan: [place]   Discharge Planning Services: CM Consult Post Acute Care Choice: Resumption of Svcs/PTA Provider Living arrangements for the past 2 months: Single Family Home                                       Social Determinants of Health (SDOH) Interventions    Readmission Risk Interventions No flowsheet data found.

## 2020-08-01 NOTE — Consult Note (Signed)
Consultation Note Date: 08/01/2020   Patient Name: [name]  DOB: 11/09/1945  MRN: [id number]  Age / Sex: 75 y.o., female  PCP: Lujean Amel, MD Referring Physician: [place] Eric J, DO  Reason for Consultation: Establishing goals of care  HPI/Patient Profile: 75 y.o. female  with past medical history of  hypertension, type 2 diabetes mellitus, chronic diastolic congestive heart failure, paroxysmal atrial fibrillation not on anticoagulation, COPD on 4 L home O2, and EtOH/tobacco use disorder admitted on 07/29/2020 with respiratory failure. She was recently admitted from 7/19-7/25 with acute on chronic respiratory failure with hypoxia and hypercapnia felt to be secondary to COPD exacerbation and right lower lobe pneumonia. Found to have PCO2 of 119 with pH of 7.2 on admission.  Chest x-ray with stable cardiomegaly with bibasilar atelectasis versus infiltrate, right greater than left with small/moderate sized right pleural effusion. Diagnosed with CHF/COPD exacerbation. Improved with bipap - now on bipap qhs with plans to dc with bipap at home. PMT consulted to discuss [place].  Clinical Assessment and Goals of Care: I have reviewed medical records including EPIC notes, labs and imaging, received report from RN, assessed the patient and then met with patient  to discuss diagnosis prognosis, GOC, EOL wishes, disposition and options.  I introduced Palliative Medicine as specialized medical care for people living with serious illness. It focuses on providing relief from the symptoms and stress of a serious illness. The goal is to improve quality of life for both the patient and the family.  We discussed a brief life review of the patient. Patient shares that she moved to [facility] from [place]. She tells me her spouse passed away 14 years ago. She had 3 children - one daughter has passed away. 2 sons live locally. She tells me she is close to her siblings  - especially her brother Gordan Payment - he is a great source of support for her. She lives alone.  As far as functional and nutritional status, she tells me she was doing "okay" at home. Tells me she gets around okay with a walker and feels that she is able to adequately care for herself. She also tells me she has a good appetite.    We discussed patient's current illness and what it means in the larger context of patient's on-going co-morbidities.  Natural disease trajectory and expectations at EOL were discussed. We discussed her CHF and COPD - provided education about each of these disease and how they affect her breathing. We discuss that these disease cannot be "fixed" but we can attempt to manage them. We discuss that these diseases are chronic and progressive.   I attempted to elicit values and goals of care important to the patient.    The difference between aggressive medical intervention and comfort care was considered in light of the patient's goals of care.   Advance directives, concepts specific to code status, artificial feeding and hydration, and rehospitalization were considered and discussed.  Patient is very uncertain about her wishes - when I ask her about all of the topics listed above she is unsure. The only topic she is not unsure about is rehospitalization - she is okay with rehospitalization. She is also clear that she would never want to be placed in a SNF - not even for short-term rehab.   We discuss her symptoms - tells me her shortness of breath is well controlled, much better. Denies any pain. Sleeping well.   Discussed with patient the importance of continued conversation with  family and the medical providers regarding overall plan of care and treatment options, ensuring decisions are within the context of the patient's values and GOCs.    Palliative Care services outpatient were explained and offered. Patient is interested in palliative support outpatient.   Questions and  concerns were addressed. The patient was encouraged to call with questions or concerns.    Primary Decision Maker PATIENT  Patient would like her brother [name] to make medical decisions for her is she were ever unable  SUMMARY OF RECOMMENDATIONS   - continue full code/full scope care - patient very uncertain about code status and would like time to consider - I encouraged her to think about these topics and discuss with family - would like to name brother as HCPOA - symptoms well controlled - agreeable to outpatient palliative referral - clear goals she was able to set: okay with repeat hospitalizations as needed, would never want SNF placement even for short term rehab  Code Status/Advance Care Planning: Full code  Discharge Planning: Home with Home Health      Primary Diagnoses: Present on Admission:  Acute on chronic respiratory failure with hypoxia and hypercapnia (HCC)  COPD exacerbation ([place])  Essential hypertension  Chronic diastolic CHF (congestive heart failure) (HCC)  PAF (paroxysmal atrial fibrillation) ([place])   I have reviewed the medical record, interviewed the patient and family, and examined the patient. The following aspects are pertinent.  Past Medical History:  Diagnosis Date   Acute exacerbation of CHF (congestive heart failure) ([place]) 09/22/2019   COPD (chronic obstructive pulmonary disease) (HCC)    Diabetes mellitus    Diastolic dysfunction    history   Hyperlipidemia    Hypertension    Social History   Socioeconomic History   Marital status: Widowed    Spouse name: Not on file   Number of children: Not on file   Years of education: Not on file   Highest education level: Not on file  Occupational History    Comment: Worked in home  Tobacco Use   Smoking status: Every Day    Packs/day: 1.00    Years: 52.00    Pack years: 52.00    Types: Cigarettes   Smokeless tobacco: Never  Vaping Use   Vaping Use: Never used  Substance and Sexual Activity    Alcohol use: Yes    Alcohol/week: 2.0 standard drinks    Types: 2 Shots of liquor per week   Drug use: Never   Sexual activity: Not on file  Other Topics Concern   Not on file  Social History Narrative   Not on file   Social Determinants of Health   Financial Resource Strain: Not on file  Food Insecurity: Not on file  Transportation Needs: Not on file  Physical Activity: Not on file  Stress: Not on file  Social Connections: Not on file   Family History  Problem Relation Age of Onset   Heart disease Father    Lung cancer Father    Scheduled Meds:  (feeding supplement) PROSource Plus  30 mL Oral BID BM   chlorhexidine  15 mL Mouth Rinse BID   Chlorhexidine Gluconate Cloth  6 each Topical Daily   diltiazem  30 mg Oral Q6H   enoxaparin (LOVENOX) injection  40 mg Subcutaneous Q24H   feeding supplement  237 mL Oral Q24H   [START ON 08/07/2020] fluconazole  150 mg Oral Weekly   folic acid  1 mg Oral Daily   furosemide  20  mg Intravenous Q12H   insulin aspart  0-9 Units Subcutaneous Q4H   insulin aspart  3 Units Subcutaneous TID WC   insulin glargine-yfgn  10 Units Subcutaneous Daily   levalbuterol  0.63 mg Nebulization Q4H   losartan  50 mg Oral Daily   mouth rinse  15 mL Mouth Rinse q12n4p   [START ON 08/02/2020] methylPREDNISolone (SOLU-MEDROL) injection  60 mg Intravenous Q24H   multivitamin with minerals  1 tablet Oral Daily   nystatin   Topical TID   PARoxetine  20 mg Oral Daily   revefenacin  175 mcg Nebulization Daily   thiamine  100 mg Oral Daily   Or   thiamine  100 mg Intravenous Daily   Continuous Infusions: PRN Meds:.levalbuterol, melatonin No Known Allergies Review of Systems  Constitutional:  Positive for activity change. Negative for appetite change.  Respiratory:  Negative for shortness of breath.    Physical Exam Constitutional:      General: She is not in acute distress. Pulmonary:     Effort: Pulmonary effort is normal.  Skin:    General: Skin  is warm and dry.  Neurological:     Mental Status: She is alert and oriented to person, place, and time.  Psychiatric:        Mood and Affect: Mood normal.    Vital Signs: BP (!) 173/45   Pulse 68   Temp 98.2 F (36.8 C) (Oral)   Resp (!) 26   Ht _0  (1.727 m)   Wt 90.3 kg   SpO2 95%   BMI 30.27 kg/m  Pain Scale: 0-10 POSS *See Group Information*: 1-Acceptable,Awake and alert Pain Score: 0-No pain   SpO2: SpO2: 95 % O2 Device:SpO2: 95 % O2 Flow Rate: .O2 Flow Rate (L/min): 5 L/min  IO: Intake/output summary:  Intake/Output Summary (Last 24 hours) at 08/01/2020 1642 Last data filed at 08/01/2020 1145 Gross per 24 hour  Intake 319.21 ml  Output 2370 ml  Net -2050.79 ml    LBM: Last BM Date: 07/31/20 Baseline Weight: Weight: 87 kg Most recent weight: Weight: 90.3 kg     Palliative Assessment/Data: PPS 50%   Time Total: 60 mintues Greater than 50%  of this time was spent counseling and coordinating care related to the above assessment and plan.  Juel Burrow, DNP, AGNP-C Palliative Medicine Team [phone] Pager: [phone]

## 2020-08-01 NOTE — Progress Notes (Signed)
PT Cancellation Note  Patient Details Name: [name] MRN: [id number] DOB: [birth year]   Cancelled Treatment:     Per chart review and discussion with RN, will hold off PT today due to ECHO currently in room and BP's remains elevated most of the day.  Pt has been evaluated with rec for SNF but will continue to monitor.   Nathanial Rancher 08/01/2020, 2:27 PM

## 2020-08-01 NOTE — Progress Notes (Addendum)
NAME:  [name], MRN:  [id number], DOB:  [birth year], LOS: 2 ADMISSION DATE:  07/29/2020, CONSULTATION DATE:  7/27?22 REFERRING MD:  Dr Eric [place] [facility], CHIEF COMPLAINT:  resp failure  PCP [place], [place], MD   BRIEF   75 yo F with COPD NOs (never had PFTs, beeon on o2 for years NOS), ongoing smoking, dCHF, 4L baseline O2 requirement.  AT baseline lives alone with dog. Uses walker for mobility. Unable to navigate 3 steps without help. Never seen a pulmonary MD. Never had PFt. Last CT chest 2018.  Chart reports daily etoh as well. No known drug use or benzo or opioid use.  Baseline functional status appears to be ECOG 3-4   Pt admitted to [facility]  7/19-7/25 with acute on chronic hypoxia and hypercapnea. With concern for RLL CAP NOS. Readmitted via ED 07/29/20 due to increased weakness and lethargy. Extreme resp acidosis in ER with pH 7.22 pCO2 of 119.  bicarb 46.  Salvaged with BIPAP and mental status and ABG improved. CCM consulted mornign of 07/30/20 for acute on chronic resp failure and recurrent admissions.  On mormning of 07/30/20 watching TV and oriented but seems to suffer from poor motivation and giving poor history. She is unclear how long she has been on o2 and using walker . REfers to her brother her caretaker for answerrs to many questions  At admit wbc normal, covid pcr/flu pcr neg, CXR with mild pleural effusion. BNO 360 (recently 237 in July). Per RN - no active wheezing (was wheezing in th ER)   Pertinent  Medical History   has a past medical history of Acute exacerbation of CHF (congestive heart failure) ([place]) (09/22/2019), COPD (chronic obstructive pulmonary disease) ([place]), Diabetes mellitus, Diastolic dysfunction, Hyperlipidemia, and Hypertension.   has a past surgical history that includes Tubal ligation.   Significant Hospital Events: Including procedures, antibiotic start and stop dates in addition to other pertinent events   07/29/2020 - admit 7/27 - ccm  consult 7/28  -  urine strep negative,  MRSA PCR - neg. Refusing bath -.  On 10L HFNC.  Trop normal. PCT negatiive x 2. In  AFib RVR this morning. - lopressors given. Med review shows prn hydralazine. Currently on BiPAP. Mag and phos low. CT chest with moderate R Pleural effusion , evidence of pulm htn and enlarged paratracheal nodes  Interim History / Subjective:    7/29 - s. R Thora 812m clear fluid. TRANSUDATE. [place] concern for early liver cirrhosis- recommending elastography. She is down to 6L HFNC ater thora and lasix yesterday/today. Used BiPAP last night. Feeling better. Admits to "2 cup of etoh daily" . Does not want to go to SNF rehab. AGrees to follow with  Pulmonary . [place] better  Was in A fib and converted to NSR. On oral cardizem  Objective   Blood pressure (!) 166/140, pulse (!) 59, temperature 97.7 F (36.5 C), temperature source Axillary, resp. rate 19, height '5\' 8"'$  (1.727 m), weight 90.3 kg, SpO2 98 %.    FiO2 (%):  [40 %-94 %] 94 %   Intake/Output Summary (Last 24 hours) at 08/01/2020 0931 Last data filed at 08/01/2020 0800 Gross per 24 hour  Intake 852.84 ml  Output 2145 ml  Net -1292.16 ml   Filed Weights   07/29/20 1720 07/30/20 0201  Weight: 87 kg 90.3 kg    Examination: General Appearance:  Looks much better. Clearn Head:  Normocephalic, without obvious abnormality, atraumatic Eyes:  PERRL -  yes, conjunctiva/corneas - muddy     Ears:  Normal external ear canals, both ears Nose:  G tube - no but has 6L [place] on Throat:  ETT TUBE - no , OG tube - no Neck:  Supple,  No enlargement/tenderness/nodules Lungs: Clear to auscultation bilaterally,  Heart:  S1 and S2 normal, no murmur, CVP - no.  Pressors - no Abdomen:  Soft, no masses, no organomegaly Genitalia / Rectal:  Not done Extremities:  Extremities- intact Skin:  ntact in exposed areas . Sacral area - x Neurologic:  Sedation - none -> RASS - +1 . Moves all 4s - yes. CAM-ICU - neg .  Orientation - x3+       LABS  Results for NYEEMAH, RIEFF (MRN [id number]) as of 07/30/2020 09:21  Ref. Range 07/29/2020 19:47 07/29/2020 20:23 07/29/2020 23:24  pH, Ven Latest Ref Range: 7.250 - 7.430  7.222 (L)  7.343  pCO2, Ven Latest Ref Range: 44.0 - 60.0 mmHg 119 (HH)  78.7 (HH)  pO2, Ven Latest Ref Range: 32.0 - 45.0 mmHg 82.2 (H)  57.0 (H)    PULMONARY Recent Labs  Lab 07/29/20 1947 07/29/20 2324  HCO3 46.9* 41.6*  O2SAT 94.9 88.3    CBC Recent Labs  Lab 07/30/20 0653 07/31/20 0245 08/01/20 0242  HGB 12.1 11.4* 10.4*  HCT 38.2 34.4* 31.8*  WBC 7.1 4.0 4.8  PLT 92* 106* 120*    COAGULATION No results for input(s): INR in the last 168 hours.  CARDIAC  No results for input(s): TROPONINI in the last 168 hours. No results for input(s): PROBNP in the last 168 hours.   CHEMISTRY Recent Labs  Lab 07/28/20 0429 07/29/20 1947 07/30/20 0500 07/30/20 0653 07/31/20 0245 08/01/20 0242  NA 129* 135  --  133* 131* 128*  K 4.5 4.7  --  4.7 4.1 3.7  CL 84* 81*  --  80* 82* 80*  CO2 38* 46*  --  44* 39* 38*  GLUCOSE 117* 126*  --  102* 148* 145*  BUN 36* 22  --  20 26* 29*  CREATININE 0.64 0.43*  --  0.39* 0.53 0.66  CALCIUM 8.6* 9.2  --  9.1 8.9 8.4*  MG  --   --  1.8  --  1.8 2.0  PHOS  --   --  2.4*  --  1.7* 4.2   Estimated Creatinine Clearance: 72.6 mL/min (by C-G formula based on SCr of 0.66 mg/dL).   LIVER Recent Labs  Lab 07/29/20 1947 07/31/20 0245 08/01/20 0242  AST '21 16 18  '$ ALT '24 20 23  '$ ALKPHOS 58 53 48  BILITOT 0.6 0.7 0.7  PROT 6.0* 5.5* 5.3*  ALBUMIN 3.4* 3.1* 2.9*     INFECTIOUS Recent Labs  Lab 07/30/20 1049 07/30/20 1235 07/31/20 0245 08/01/20 0242  LATICACIDVEN 2.9*  --   --   --   PROCALCITON  --  <0.10 <0.10 <0.10     ENDOCRINE CBG (last 3)  Recent Labs    07/31/20 2323 08/01/20 0339 08/01/20 0751  GLUCAP 177* 153* 159*         IMAGING x48h  - image(s) personally visualized  -   highlighted in  bold CT Chest High Resolution  Result Date: 07/30/2020 CLINICAL DATA:  Hypoxia, chronic respiratory failure, extreme respiratory acidosis, BiPAP EXAM: CT CHEST WITHOUT CONTRAST TECHNIQUE: Multidetector CT imaging of the chest was performed following the standard protocol without intravenous contrast. High resolution imaging of the lungs, as well as inspiratory  and expiratory imaging, was performed. COMPARISON:  10/08/2016 FINDINGS: Cardiovascular: Aortic atherosclerosis. Normal heart size. Three-vessel coronary artery calcifications. No pericardial effusion. Gross enlargement of the main pulmonary artery measuring up to 4.1 cm in caliber. Mediastinum/Nodes: Markedly enlarged pretracheal and right hilar lymph nodes, largest pretracheal node measuring 2.9 x 2.1 cm (series 2, image 63). Thyroid gland, trachea, and esophagus demonstrate no significant findings. Lungs/Pleura: Moderate right, small left pleural effusions with extensive associated atelectasis or consolidation of the bilateral lung bases. There is mild interlobular septal thickening and minimal centrilobular and paraseptal emphysema. Diffuse bilateral bronchial wall thickening. No significant air trapping on expiratory phase imaging. Upper Abdomen: No acute abnormality. Musculoskeletal: No chest wall mass or suspicious bone lesions identified. There are new, although age indeterminate wedge deformities of 11, T12, and L1, with a high-grade vertebral plana deformity of T12 (series 8, image 64). IMPRESSION: 1. Assessment for fibrotic interstitial lung disease is significantly limited by the presence of pleural effusions. Within this limitation, there is no specific evidence of fibrotic interstitial lung disease. 2. Moderate right, small left pleural effusions with extensive associated atelectasis or consolidation of the bilateral lung bases. There is mild interlobular septal thickening, most consistent with mild pulmonary edema. 3. Minimal emphysema. 4.  Diffuse bilateral bronchial wall thickening, which may reflect interstitial edema and/or infectious/inflammatory bronchitis. 5. Markedly enlarged pretracheal and right hilar lymph nodes, largest pretracheal node measuring 2.9 x 2.1 cm. These are nonspecific and most likely reactive, although neoplasm is difficult to exclude. 6. Gross enlargement of the main pulmonary artery measuring up to 4.1 cm in caliber, as can be seen in pulmonary hypertension. 7. Coronary artery disease. Aortic Atherosclerosis (ICD10-I70.0) and Emphysema (ICD10-J43.9). Electronically Signed   By: Eddie Candle M.D.   On: 07/30/2020 15:13   DG Chest Port 1 View  Result Date: 07/31/2020 CLINICAL DATA:  Status post right thoracentesis. EXAM: PORTABLE CHEST 1 VIEW COMPARISON:  07/29/2020 FINDINGS: Unchanged mild cardiomegaly. No significant pulmonary vascular congestion. Interval decrease of right pleural effusion. No pneumothorax is identified. Small left pleural effusion is unchanged. IMPRESSION: No pneumothorax status post right thoracentesis. Electronically Signed   By: Miachel Roux M.D.   On: 07/31/2020 17:00   US Abdomen Limited RUQ (LIVER/GB)  Result Date: 07/31/2020 CLINICAL DATA:  75 year old female with history of alcoholism and concern for cirrhosis. EXAM: ULTRASOUND ABDOMEN LIMITED RIGHT UPPER QUADRANT COMPARISON:  CT abdomen pelvis dated 08/24/2017. FINDINGS: Gallbladder: The gallbladder is not visualized, likely contracted or surgically absent. Clinical correlation is recommended. Common bile duct: Diameter: 6 mm Liver: The liver demonstrates a slightly increased echogenicity with mildly coarsened echotexture which may represent mild fatty infiltration. Early fibrosis is not excluded. Elastography may provide better evaluation. Portal vein is patent on color Doppler imaging with normal direction of blood flow towards the liver. Other: None. IMPRESSION: Probable mild fatty liver and findings concerning for early fibrosis.  Elastography may provide better evaluation if clinically indicated. Electronically Signed   By: Anner Crete M.D.   On: 07/31/2020 16:56   US THORACENTESIS ASP PLEURAL SPACE W/IMG GUIDE  Result Date: 07/31/2020 INDICATION: Shortness of breath. Right-sided pleural effusion. Request for diagnostic and therapeutic thoracentesis. EXAM: ULTRASOUND GUIDED RIGHT THORACENTESIS MEDICATIONS: 1% plain lidocaine, 7 mL COMPLICATIONS: None immediate. PROCEDURE: An ultrasound guided thoracentesis was thoroughly discussed with the patient and questions answered. The benefits, risks, alternatives and complications were also discussed. The patient understands and wishes to proceed with the procedure. Written consent was obtained. Ultrasound was performed to localize and mark  an adequate pocket of fluid in the right chest. The area was then prepped and draped in the normal sterile fashion. 1% Lidocaine was used for local anesthesia. Under ultrasound guidance a 6 Fr Safe-T-Centesis catheter was introduced. Thoracentesis was performed. The catheter was removed and a dressing applied. FINDINGS: A total of approximately 825 mL of clear yellow fluid was removed. Samples were sent to the laboratory as requested by the clinical team. IMPRESSION: Successful ultrasound guided right thoracentesis yielding 825 mL of pleural fluid. Read by: Ascencion Dike PA-C Electronically Signed   By: Miachel Roux M.D.   On: 07/31/2020 15:33     Resolved Hospital Problem list   x  Assessment & Plan:  Baseline prior to and at the time of admission  -Alcoholism -Self-neglect -Poor functional status ECOG 3-4 -Immobility -Severe physical deconditioning - Protein calorie malnurituion (alb < 4) at admit - mild to moderate -Heavy tobacco abuse -Chronic hypoxemic and hypercapnic respiratory failure without pulmonary support as outpatient -Baseline diastolic dysfunction grade 1   Current admission Acute on chronic hypoxemic and hypercapnic  respiratory failure  with  R > L pleura effusion - at ADMIT -likely due to COPD exacerbation (recurrent) and worsening acute on chronic Diast CHF (Rt pleura effusion is transudate) - also  medication noncompliance after recent discharge and ongoing smoking as contirubioor factors - no eviddnece of MI - RVP pending  New diagnosis of Mediastinal Nodes 07/30/20 on CT byt  likely present on admit  R > L pleural effusion (Transudate)  - present on admit   - etioogy - > ? Cirrhosis v Diast CHF   New onset  A Fibr RVR 07/30/20 - resolved 07/31/20. Now on po cardizme  7/29 - improved and down to 6L  after lasix   Plan -  lasix x 1 x '40mg'$  repeat  - Await RVP - Rx A Fib RVR - dc lopressor and hydralazine -  oral cardizem  -  Xopenex scheduled - Continue yupelri -IV steroids to continue butdecrease to daily - Prior to dc needs triple inhaler Rx eg Trelgey or BREZTRI - REduce BipAP to QHS -> check ABG 08/02/20 and if hypercapnic needs opd BiPAP/trilogy qhs -Recommend PT/OT consult; might need SNF rehab at discharge -Monitor for alcohol withdrawal  - needs precedex -Needs outpatient pulmonary function test, alpha-1 antitrypsin phenotype check and tracking mediastinal nodes also outpatient pulmonologist  - Needs goals of care   - move to progressive    CCM will be available again Monday 08/04/20 or Tuesday 08/05/20   Best Practice (right click and "Reselect all SmartList Selections" daily)   According to the hospitalist but called brother Rosita Kea [phone] - could not reach 7/28 and kept rining 7/29   Future Appointments  Date Time Provider [place]  08/01/2020 12:00 PM [facility]- [facility]CHO [facility]  08/13/2020  4:00 PM Martyn Ehrich, NP [facility]UL[facility] None  10/07/2020  2:30 PM Lorretta Harp, MD [facility]      SIGNATURE    Dr. Brand Males, M.D., F.C.C.P,  Pulmonary and Critical Care Medicine Staff Physician, [place]  Director - Interstitial Lung Disease  Program  Pulmonary [place] [facility] [place], [place], [address]  NPI Number:  NPI [id number]  Pager: [phone], If no answer  -> Check AMION or Try [place] Telephone (clinical office): [phone] Telephone (research): [phone]  10:07 AM 08/01/2020

## 2020-08-01 NOTE — Progress Notes (Signed)
  Echocardiogram 2D Echocardiogram has been performed.  [name] 08/01/2020, 12:02 PM

## 2020-08-02 DIAGNOSIS — J90 Pleural effusion, not elsewhere classified: Secondary | ICD-10-CM | POA: Diagnosis not present

## 2020-08-02 DIAGNOSIS — F17200 Nicotine dependence, unspecified, uncomplicated: Secondary | ICD-10-CM

## 2020-08-02 DIAGNOSIS — E871 Hypo-osmolality and hyponatremia: Secondary | ICD-10-CM

## 2020-08-02 DIAGNOSIS — R5381 Other malaise: Secondary | ICD-10-CM

## 2020-08-02 DIAGNOSIS — E876 Hypokalemia: Secondary | ICD-10-CM

## 2020-08-02 DIAGNOSIS — I5033 Acute on chronic diastolic (congestive) heart failure: Secondary | ICD-10-CM | POA: Diagnosis not present

## 2020-08-02 DIAGNOSIS — J9621 Acute and chronic respiratory failure with hypoxia: Secondary | ICD-10-CM | POA: Diagnosis not present

## 2020-08-02 DIAGNOSIS — J441 Chronic obstructive pulmonary disease with (acute) exacerbation: Secondary | ICD-10-CM | POA: Diagnosis not present

## 2020-08-02 DIAGNOSIS — R531 Weakness: Secondary | ICD-10-CM

## 2020-08-02 DIAGNOSIS — L304 Erythema intertrigo: Secondary | ICD-10-CM

## 2020-08-02 LAB — COMPREHENSIVE METABOLIC PANEL
ALT: 29 U/L (ref 0–44)
AST: 22 U/L (ref 15–41)
Albumin: 3.2 g/dL — ABNORMAL LOW (ref 3.5–5.0)
Alkaline Phosphatase: 59 U/L (ref 38–126)
Anion gap: 12 (ref 5–15)
BUN: 45 mg/dL — ABNORMAL HIGH (ref 8–23)
CO2: 41 mmol/L — ABNORMAL HIGH (ref 22–32)
Calcium: 8.6 mg/dL — ABNORMAL LOW (ref 8.9–10.3)
Chloride: 75 mmol/L — ABNORMAL LOW (ref 98–111)
Creatinine, Ser: 0.77 mg/dL (ref 0.44–1.00)
GFR, Estimated: >60 mL/min (ref >60)
Glucose, Bld: 135 mg/dL — ABNORMAL HIGH (ref 70–99)
Potassium: 3.1 mmol/L — ABNORMAL LOW (ref 3.5–5.1)
Sodium: 128 mmol/L — ABNORMAL LOW (ref 135–145)
Total Bilirubin: 0.6 mg/dL (ref 0.3–1.2)
Total Protein: 5.7 g/dL — ABNORMAL LOW (ref 6.5–8.1)

## 2020-08-02 LAB — CBC
HCT: 33.8 % — ABNORMAL LOW (ref 36.0–46.0)
Hemoglobin: 11.4 g/dL — ABNORMAL LOW (ref 12.0–15.0)
MCH: 33.7 pg (ref 26.0–34.0)
MCHC: 33.7 g/dL (ref 30.0–36.0)
MCV: 100 fL (ref 80.0–100.0)
Platelets: 138 10*3/uL — ABNORMAL LOW (ref 150–400)
RBC: 3.38 MIL/uL — ABNORMAL LOW (ref 3.87–5.11)
RDW: 12.8 % (ref 11.5–15.5)
WBC: 8 10*3/uL (ref 4.0–10.5)
nRBC: 0 % (ref 0.0–0.2)

## 2020-08-02 LAB — MAGNESIUM: Magnesium: 1.9 mg/dL (ref 1.7–2.4)

## 2020-08-02 LAB — PHOSPHORUS: Phosphorus: 2.9 mg/dL (ref 2.5–4.6)

## 2020-08-02 LAB — GLUCOSE, CAPILLARY
Glucose-Capillary: 107 mg/dL — ABNORMAL HIGH (ref 70–99)
Glucose-Capillary: 192 mg/dL — ABNORMAL HIGH (ref 70–99)
Glucose-Capillary: 386 mg/dL — ABNORMAL HIGH (ref 70–99)
Glucose-Capillary: 217 mg/dL — ABNORMAL HIGH (ref 70–99)
Glucose-Capillary: 172 mg/dL — ABNORMAL HIGH (ref 70–99)

## 2020-08-02 LAB — BLOOD GAS, ARTERIAL
Acid-Base Excess: 19.2 mmol/L — ABNORMAL HIGH (ref 0.0–2.0)
Bicarbonate: 46.1 mmol/L — ABNORMAL HIGH (ref 20.0–28.0)
FIO2: 45
Mode: POSITIVE
O2 Saturation: 97.4 %
Patient temperature: 97.9
pCO2 arterial: 60.3 mmHg — ABNORMAL HIGH (ref 32.0–48.0)
pH, Arterial: 7.494 — ABNORMAL HIGH (ref 7.350–7.450)
pO2, Arterial: 98.6 mmHg (ref 83.0–108.0)

## 2020-08-02 MED ORDER — INSULIN ASPART 100 UNIT/ML IJ SOLN
4.0000 [IU] | Freq: Three times a day (TID) | INTRAMUSCULAR | Status: DC
  Administered 2020-08-02 – 2020-08-05 (×9): 4 [IU] via SUBCUTANEOUS

## 2020-08-02 MED ORDER — DILTIAZEM HCL ER COATED BEADS 180 MG PO CP24
180.0000 mg | ORAL_CAPSULE | Freq: Every day | ORAL | Status: DC
  Administered 2020-08-02 – 2020-08-06 (×5): 180 mg via ORAL
  Filled 2020-08-02 (×5): qty 1

## 2020-08-02 MED ORDER — INSULIN GLARGINE-YFGN 100 UNIT/ML ~~LOC~~ SOLN
10.0000 [IU] | Freq: Two times a day (BID) | SUBCUTANEOUS | Status: DC
  Administered 2020-08-02 – 2020-08-06 (×8): 10 [IU] via SUBCUTANEOUS
  Filled 2020-08-02 (×8): qty 0.1

## 2020-08-02 MED ORDER — HYDRALAZINE HCL 20 MG/ML IJ SOLN
10.0000 mg | Freq: Once | INTRAMUSCULAR | Status: AC
  Administered 2020-08-02: 10 mg via INTRAVENOUS
  Filled 2020-08-02: qty 1

## 2020-08-02 MED ORDER — DILTIAZEM HCL 30 MG PO TABS: 30.0000 mg | ORAL_TABLET | Freq: Four times a day (QID) | ORAL | Status: DC | PRN

## 2020-08-02 MED ORDER — INSULIN ASPART 100 UNIT/ML IJ SOLN
0.0000 [IU] | Freq: Every day | INTRAMUSCULAR | Status: DC
  Administered 2020-08-03: 2 [IU] via SUBCUTANEOUS
  Administered 2020-08-04 – 2020-08-05 (×2): 3 [IU] via SUBCUTANEOUS

## 2020-08-02 MED ORDER — INSULIN GLARGINE-YFGN 100 UNIT/ML ~~LOC~~ SOLN: 10.0000 [IU] | Freq: Every day | SUBCUTANEOUS | Status: DC

## 2020-08-02 MED ORDER — LEVALBUTEROL HCL 0.63 MG/3ML IN NEBU: 0.6300 mg | INHALATION_SOLUTION | RESPIRATORY_TRACT | Status: DC | PRN

## 2020-08-02 MED ORDER — INSULIN ASPART 100 UNIT/ML IJ SOLN
0.0000 [IU] | Freq: Three times a day (TID) | INTRAMUSCULAR | Status: DC
Start: 2020-08-02 — End: 2020-08-06
  Administered 2020-08-02: 5 [IU] via SUBCUTANEOUS
  Administered 2020-08-03: 3 [IU] via SUBCUTANEOUS
  Administered 2020-08-03: 2 [IU] via SUBCUTANEOUS
  Administered 2020-08-04: 11 [IU] via SUBCUTANEOUS
  Administered 2020-08-05: 2 [IU] via SUBCUTANEOUS

## 2020-08-02 NOTE — Progress Notes (Signed)
eLink Physician-Brief Progress Note Patient Name: [name] DOB: 11/15/1945 MRN: [id number]   Date of Service  08/02/2020  HPI/Events of Note  Notified of K 3.1 Creatinine 0.77  eICU Interventions  Initiate electrolyte replacement protocol     Intervention Category Intermediate Interventions: Electrolyte abnormality - evaluation and management  [name] 08/02/2020, 5:07 AM

## 2020-08-02 NOTE — Progress Notes (Signed)
BIPAP only on standby at this time. Patient wore BIPAP last night but currently tolerating 7LHFNC well at this time. 99%

## 2020-08-02 NOTE — Progress Notes (Signed)
PROGRESS NOTE  [name] [id number] DOB: [birth year]   PCP: Lujean Amel, MD  Patient is from: Home  DOA: 07/29/2020 LOS: 3  Chief complaints:  Chief Complaint  Patient presents with   Fatigue     Brief Narrative / Interim history: 75 year old F with PMH of COPD/chronic hypoxic RF on 4 L, PAF not on AC, diastolic CHF, DM-2, HTN, EtOH and tobacco use disorder presenting to ED with lethargy, shortness of breath and confusion, and admitted for acute on chronic RF due to COPD exacerbation, A. fib with RVR and a small to moderate right pleural effusion.  Patient was managed with BiPAP, systemic steroid, antibiotics and nebulizers with improvement in his symptoms.  PCCM and palliative medicine following.  Subjective: Seen and examined earlier this morning.  No major events overnight of this morning.  She wore BiPAP last night.  No complaint this morning.  Reports improvement in her breathing.  She denies chest pain or palpitation.  Denies GI or UTI symptoms.  Objective: Vitals:   08/02/20 0744 08/02/20 0800 08/02/20 1021 08/02/20 1146  BP:  (!) 167/73    Pulse:  81    Resp:  17    Temp: 97.8 F (36.6 C)   98.4 F (36.9 C)  TempSrc: Oral   Oral  SpO2:  91% 97% 94%  Weight:      Height:        Intake/Output Summary (Last 24 hours) at 08/02/2020 1230 Last data filed at 08/02/2020 1015 Gross per 24 hour  Intake 780 ml  Output 2300 ml  Net -1520 ml   Filed Weights   07/29/20 1720 07/30/20 0201  Weight: 87 kg 90.3 kg    Examination:  GENERAL: No apparent distress.  Nontoxic. HEENT: MMM.  Vision and hearing grossly intact.  NECK: Supple.  No apparent JVD.  RESP: 98% on 4 L.  No IWOB.  Fair aeration bilaterally. CVS:  RRR. Heart sounds normal.  ABD/GI/GU: BS+. Abd soft, NTND.  MSK/EXT:  Moves extremities. No apparent deformity. No edema.  SKIN: no apparent skin lesion or wound NEURO: Awake, alert and oriented appropriately.  No apparent focal neuro  deficit. PSYCH: Calm. Normal affect.   Procedures:  None  Microbiology summarized: [id number] and influenza PCR nonreactive. Full RVP panel negative. Right pleural fluid culture NGTD. AFB and fungal culture pending.  Assessment & Plan: Acute on chronic respiratory failure with hypoxia and hypercapnia, POA: On 4 L at baseline.  Improved. COPD exacerbation-improving. Moderate right, small left pleural effusion Bibasilar atelectasis/consolidation/diffuse bilateral bronchial wall thickening -S/p right thoracocentesis with removal of 825 cc exudative and culture negative fluid -Received IV cefepime 7/27-7/28. -Continue BiPAP nightly -Continue Solu-Medrol, Yupelri and as needed Xopenex -Added as needed Atrovent. -Discontinue beta-blocker.  Use Cardizem instead -Minimum oxygen to keep saturation above 88% despite home 4 L. -Encourage incentive telemetry/OOB/PT/OT -PCCM recommends trilogy versus BiPAP nightly on discharge.  TOC assisting with acquisition -Palliative medicine following.   Paroxysmal atrial fibrillation with RVR: Rate controlled.  On Toprol-XL at home.  Not on AC. -Discontinue Toprol XL -P.o. Cardizem CD1 180 mg daily -P.o. Cardizem 30 mg every 6 hours as needed  Acute on chronic diastolic CHF: TTE LVEF of 60 to 65%, G1 DD.  BNP 360.  Imaging with small left and moderate right pleural effusion.  Appears euvolemic on exam.  Creatinine slightly up -Continue IV Lasix 20 mg every 12 hours -Monitor fluid status, renal functions and electrolytes  Pretracheal/hilar lymph nodes-noted on high-resolution CT on 7/27. -  Needs follow-up imaging   Essential hypertension: Normotensive. -Continue home losartan -Cardizem as above  Controlled DM-2 with hyperglycemia/hyperlipidemia: A1c 5.8% on 7/27.  On metformin and Jardiance at home.  Hyperglycemia likely due to steroid Recent Labs  Lab 08/01/20 1910 08/01/20 2312 08/02/20 0416 08/02/20 0744 08/02/20 1138  GLUCAP 261* 271*  107* 192* 386*  -Increase SSI to moderate -Add NovoLog 4 units 3 times daily with meals -On Lantus 10 units twice daily -Continue home Lipitor  Hyponatremia-due to hypervolemia? -Diuretics as above.  Depression: Stable -Continue paroxetine 20 mg p.o. daily   EtOH abuse disorder -CIWAA protocol -Folic acid, multivitamin, thiamine   Tobacco use disorder -Encourage tobacco cessation -Continue nicotine patch   Intertrigo -Diflucan 150 mg p.o. weekly x 4 weeks, topical nystatin 3 times daily   Hypokalemia/hypomagnesemia/hypophosphatemia: K3.1.  Mg 1.9. -Replenish and recheck in the morning   Acute urinary retention/bladder over distention-seems to have passed voiding trial. -Closely monitor urine output   Weakness/deconditioning/debility: Therapy recommended SNF but she prefers to go home. -Continue therapy while inpatient   Goals of care: Recurrent hospitalization with medical noncompliance and continued EtOH and tobacco abuse at home.  Still full code -Palliative care following.   Increased nutrient needs Body mass index is 30.27 kg/m. Nutrition Problem: Increased nutrient needs Etiology: acute illness Signs/Symptoms: estimated needs Interventions: MVI, Ensure Enlive (each supplement provides 350kcal and 20 grams of protein), Prostat Pressure Injury 09/22/19 Ankle Right;Lateral Stage 1 -  Intact skin with non-blanchable redness of a localized area usually over a bony prominence. (Active)  09/22/19 1530  Location: Ankle  Location Orientation: Right;Lateral  Staging: Stage 1 -  Intact skin with non-blanchable redness of a localized area usually over a bony prominence.  Wound Description (Comments):   Present on Admission: Yes     Pressure Injury 09/22/19 Heel Left Stage 1 -  Intact skin with non-blanchable redness of a localized area usually over a bony prominence. (Active)  09/22/19 1530  Location: Heel  Location Orientation: Left  Staging: Stage 1 -  Intact skin  with non-blanchable redness of a localized area usually over a bony prominence.  Wound Description (Comments):   Present on Admission: Yes   DVT prophylaxis:  enoxaparin (LOVENOX) injection 40 mg Start: 07/30/20 1000  Code Status: Full code Family Communication: Patient and/or RN. Available if any question.  Level of care: Progressive Status is: Inpatient  Remains inpatient appropriate because:Persistent severe electrolyte disturbances, Unsafe d/c plan, IV treatments appropriate due to intensity of illness or inability to take PO, and Inpatient level of care appropriate due to severity of illness  Dispo:  Patient From: Home  Planned Disposition: [place]?  Medically stable for discharge: No         Consultants:  Pulmonology Palliative medicine   Sch Meds:  Scheduled Meds:  (feeding supplement) PROSource Plus  30 mL Oral BID BM   chlorhexidine  15 mL Mouth Rinse BID   Chlorhexidine Gluconate Cloth  6 each Topical Daily   diltiazem  180 mg Oral Daily   enoxaparin (LOVENOX) injection  40 mg Subcutaneous Q24H   feeding supplement  237 mL Oral Q24H   [START ON 08/07/2020] fluconazole  150 mg Oral Weekly   folic acid  1 mg Oral Daily   furosemide  20 mg Intravenous Q12H   insulin aspart  0-9 Units Subcutaneous Q4H   insulin aspart  3 Units Subcutaneous TID WC   insulin glargine-yfgn  10 Units Subcutaneous Daily   levalbuterol  0.63  mg Nebulization Q4H   losartan  100 mg Oral Daily   mouth rinse  15 mL Mouth Rinse q12n4p   methylPREDNISolone (SOLU-MEDROL) injection  60 mg Intravenous Q24H   multivitamin with minerals  1 tablet Oral Daily   nystatin   Topical TID   PARoxetine  20 mg Oral Daily   revefenacin  175 mcg Nebulization Daily   thiamine  100 mg Oral Daily   Or   thiamine  100 mg Intravenous Daily   Continuous Infusions: PRN Meds:.diltiazem, levalbuterol, melatonin  Antimicrobials: Anti-infectives (From admission, onward)    Start      Dose/Rate Route Frequency Ordered Stop   08/07/20 1000  fluconazole (DIFLUCAN) tablet 150 mg        150 mg Oral Weekly 07/31/20 1415 08/28/20 0959   07/30/20 1000  fluconazole (DIFLUCAN) tablet 100 mg  Status:  Discontinued        100 mg Oral Daily 07/30/20 0810 07/31/20 1415   07/30/20 0600  ceFEPIme (MAXIPIME) 2 g in sodium chloride 0.9 % 100 mL IVPB  Status:  Discontinued        2 g 200 mL/hr over 30 Minutes Intravenous Every 8 hours 07/30/20 0309 07/31/20 0959        I have personally reviewed the following labs and images: CBC: Recent Labs  Lab 07/29/20 1947 07/30/20 0653 07/31/20 0245 08/01/20 0242 08/02/20 0220  WBC 7.0 7.1 4.0 4.8 8.0  NEUTROABS 5.6  --   --   --   --   HGB 12.3 12.1 11.4* 10.4* 11.4*  HCT 39.7 38.2 34.4* 31.8* 33.8*  MCV 107.3* 105.2* 100.3* 99.7 100.0  PLT 94* 92* 106* 120* 138*   BMP &GFR Recent Labs  Lab 07/29/20 1947 07/30/20 0500 07/30/20 0653 07/31/20 0245 08/01/20 0242 08/02/20 0216 08/02/20 0220  NA 135  --  133* 131* 128*  --  128*  K 4.7  --  4.7 4.1 3.7  --  3.1*  CL 81*  --  80* 82* 80*  --  75*  CO2 46*  --  44* 39* 38*  --  41*  GLUCOSE 126*  --  102* 148* 145*  --  135*  BUN 22  --  20 26* 29*  --  45*  CREATININE 0.43*  --  0.39* 0.53 0.66  --  0.77  CALCIUM 9.2  --  9.1 8.9 8.4*  --  8.6*  MG  --  1.8  --  1.8 2.0 1.9  --   PHOS  --  2.4*  --  1.7* 4.2  --  2.9   Estimated Creatinine Clearance: 72.6 mL/min (by C-G formula based on SCr of 0.77 mg/dL). Liver & Pancreas: Recent Labs  Lab 07/29/20 1947 07/31/20 0245 08/01/20 0242 08/02/20 0220  AST '21 16 18 22  '$ ALT '24 20 23 29  '$ ALKPHOS 58 53 48 59  BILITOT 0.6 0.7 0.7 0.6  PROT 6.0* 5.5* 5.3* 5.7*  ALBUMIN 3.4* 3.1* 2.9* 3.2*   No results for input(s): LIPASE, AMYLASE in the last 168 hours. No results for input(s): AMMONIA in the last 168 hours. Diabetic: No results for input(s): HGBA1C in the last 72 hours. Recent Labs  Lab 08/01/20 1910 08/01/20 2312  08/02/20 0416 08/02/20 0744 08/02/20 1138  GLUCAP 261* 271* 107* 192* 386*   Cardiac Enzymes: No results for input(s): CKTOTAL, CKMB, CKMBINDEX, TROPONINI in the last 168 hours. No results for input(s): PROBNP in the last 8760 hours. Coagulation Profile: No results for  input(s): INR, PROTIME in the last 168 hours. Thyroid Function Tests: No results for input(s): TSH, T4TOTAL, FREET4, T3FREE, THYROIDAB in the last 72 hours. Lipid Profile: No results for input(s): CHOL, HDL, LDLCALC, TRIG, CHOLHDL, LDLDIRECT in the last 72 hours. Anemia Panel: No results for input(s): VITAMINB12, FOLATE, FERRITIN, TIBC, IRON, RETICCTPCT in the last 72 hours. Urine analysis:    Component Value Date/Time   COLORURINE YELLOW 07/30/2020 0807   APPEARANCEUR CLEAR 07/30/2020 0807   LABSPEC 1.016 07/30/2020 0807   PHURINE 5.0 07/30/2020 0807   GLUCOSEU 50 (A) 07/30/2020 0807   HGBUR NEGATIVE 07/30/2020 0807   BILIRUBINUR NEGATIVE 07/30/2020 0807   KETONESUR NEGATIVE 07/30/2020 0807   PROTEINUR 30 (A) 07/30/2020 0807   NITRITE NEGATIVE 07/30/2020 0807   LEUKOCYTESUR NEGATIVE 07/30/2020 0807   Sepsis Labs: Invalid input(s): PROCALCITONIN, [place]  Microbiology: Recent Results (from the past 240 hour(s))  Resp Panel by RT-PCR (Flu A&B, Covid) Nasopharyngeal Swab     Status: None   Collection Time: 07/29/20  8:23 PM   Specimen: Nasopharyngeal Swab; Nasopharyngeal(NP) swabs in vial transport medium  Result Value Ref Range Status   SARS Coronavirus 2 by RT PCR NEGATIVE NEGATIVE Final    Comment: (NOTE) SARS-CoV-2 target nucleic acids are NOT DETECTED.  The SARS-CoV-2 RNA is generally detectable in upper respiratory specimens during the acute phase of infection. The lowest concentration of SARS-CoV-2 viral copies this assay can detect is 138 copies/mL. A negative result does not preclude SARS-Cov-2 infection and should not be used as the sole basis for treatment or other patient management  decisions. A negative result may occur with  improper specimen collection/handling, submission of specimen other than nasopharyngeal swab, presence of viral mutation(s) within the areas targeted by this assay, and inadequate number of viral copies(<138 copies/mL). A negative result must be combined with clinical observations, patient history, and epidemiological information. The expected result is Negative.  Fact Sheet for Patients:  EntrepreneurPulse.com.au  Fact Sheet for Healthcare Providers:  IncredibleEmployment.be  This test is no t yet approved or cleared by the [place] FDA and  has been authorized for detection and/or diagnosis of SARS-CoV-2 by FDA under an Emergency Use Authorization (EUA). This EUA will remain  in effect (meaning this test can be used) for the duration of the COVID-19 declaration under Section 564(b)(1) of the Act, 21 U.S.C.section 360bbb-3(b)(1), unless the authorization is terminated  or revoked sooner.       Influenza A by PCR NEGATIVE NEGATIVE Final   Influenza B by PCR NEGATIVE NEGATIVE Final    Comment: (NOTE) The Xpert Xpress SARS-CoV-2/FLU/RSV plus assay is intended as an aid in the diagnosis of influenza from Nasopharyngeal swab specimens and should not be used as a sole basis for treatment. Nasal washings and aspirates are unacceptable for Xpert Xpress SARS-CoV-2/FLU/RSV testing.  Fact Sheet for Patients: EntrepreneurPulse.com.au  Fact Sheet for Healthcare Providers: IncredibleEmployment.be  This test is not yet approved or cleared by the [place] FDA and has been authorized for detection and/or diagnosis of SARS-CoV-2 by FDA under an Emergency Use Authorization (EUA). This EUA will remain in effect (meaning this test can be used) for the duration of the COVID-19 declaration under Section 564(b)(1) of the Act, 21 U.S.C. section 360bbb-3(b)(1), unless the  authorization is terminated or revoked.  Performed at [facility], [place] [address], [place], [place] [address]   Respiratory (~20 pathogens) panel by PCR     Status: None   Collection Time: 07/30/20 10:58 AM  Specimen: Nasopharyngeal Swab; Respiratory  Result Value Ref Range Status   Adenovirus NOT DETECTED NOT DETECTED Final   Coronavirus 229E NOT DETECTED NOT DETECTED Final    Comment: (NOTE) The Coronavirus on the Respiratory Panel, DOES NOT test for the novel  Coronavirus (2019 nCoV)    Coronavirus HKU1 NOT DETECTED NOT DETECTED Final   Coronavirus NL63 NOT DETECTED NOT DETECTED Final   Coronavirus OC43 NOT DETECTED NOT DETECTED Final   Metapneumovirus NOT DETECTED NOT DETECTED Final   Rhinovirus / Enterovirus NOT DETECTED NOT DETECTED Final   Influenza A NOT DETECTED NOT DETECTED Final   Influenza B NOT DETECTED NOT DETECTED Final   Parainfluenza Virus 1 NOT DETECTED NOT DETECTED Final   Parainfluenza Virus 2 NOT DETECTED NOT DETECTED Final   Parainfluenza Virus 3 NOT DETECTED NOT DETECTED Final   Parainfluenza Virus 4 NOT DETECTED NOT DETECTED Final   Respiratory Syncytial Virus NOT DETECTED NOT DETECTED Final   Bordetella pertussis NOT DETECTED NOT DETECTED Final   Bordetella Parapertussis NOT DETECTED NOT DETECTED Final   Chlamydophila pneumoniae NOT DETECTED NOT DETECTED Final   Mycoplasma pneumoniae NOT DETECTED NOT DETECTED Final    Comment: Performed at [facility], [place]. [address] [place], [place] [address]  Body fluid culture w Gram Stain     Status: None (Preliminary result)   Collection Time: 07/31/20  1:50 PM   Specimen: Pleura; Body Fluid  Result Value Ref Range Status   Specimen Description   Final    PLEURAL RIGHT Performed at [facility] [address], [place], [place] [address]    Special Requests   Final    Normal Performed at [facility], [place] [address] [place], [place] [address]     Gram Stain   Final    RARE WBC PRESENT, PREDOMINANTLY MONONUCLEAR NO ORGANISMS SEEN    Culture   Final    NO GROWTH 2 DAYS Performed at [facility], [place] [address] [place], [place] [address]    Report Status PENDING  Incomplete    Radiology Studies: No results found.    Maciej Schweitzer T. [place]  If 7PM-7AM, please contact night-coverage [email or web address] 08/02/2020, 12:30 PM

## 2020-08-02 NOTE — Progress Notes (Signed)
ABG collected and called lab.

## 2020-08-03 DIAGNOSIS — J9621 Acute and chronic respiratory failure with hypoxia: Secondary | ICD-10-CM | POA: Diagnosis not present

## 2020-08-03 DIAGNOSIS — J90 Pleural effusion, not elsewhere classified: Secondary | ICD-10-CM | POA: Diagnosis not present

## 2020-08-03 DIAGNOSIS — J441 Chronic obstructive pulmonary disease with (acute) exacerbation: Secondary | ICD-10-CM | POA: Diagnosis not present

## 2020-08-03 DIAGNOSIS — I5033 Acute on chronic diastolic (congestive) heart failure: Secondary | ICD-10-CM | POA: Diagnosis not present

## 2020-08-03 LAB — CBC
HCT: 36.3 % (ref 36.0–46.0)
Hemoglobin: 12.2 g/dL (ref 12.0–15.0)
MCH: 33.5 pg (ref 26.0–34.0)
MCHC: 33.6 g/dL (ref 30.0–36.0)
MCV: 99.7 fL (ref 80.0–100.0)
Platelets: 142 10*3/uL — ABNORMAL LOW (ref 150–400)
RBC: 3.64 MIL/uL — ABNORMAL LOW (ref 3.87–5.11)
RDW: 12.5 % (ref 11.5–15.5)
WBC: 8.2 10*3/uL (ref 4.0–10.5)
nRBC: 0 % (ref 0.0–0.2)

## 2020-08-03 LAB — RENAL FUNCTION PANEL
Albumin: 3.3 g/dL — ABNORMAL LOW (ref 3.5–5.0)
Anion gap: 11 (ref 5–15)
BUN: 39 mg/dL — ABNORMAL HIGH (ref 8–23)
CO2: 41 mmol/L — ABNORMAL HIGH (ref 22–32)
Calcium: 8.8 mg/dL — ABNORMAL LOW (ref 8.9–10.3)
Chloride: 74 mmol/L — ABNORMAL LOW (ref 98–111)
Creatinine, Ser: 0.72 mg/dL (ref 0.44–1.00)
GFR, Estimated: >60 mL/min (ref >60)
Glucose, Bld: 132 mg/dL — ABNORMAL HIGH (ref 70–99)
Phosphorus: 3 mg/dL (ref 2.5–4.6)
Potassium: 3.3 mmol/L — ABNORMAL LOW (ref 3.5–5.1)
Sodium: 126 mmol/L — ABNORMAL LOW (ref 135–145)

## 2020-08-03 LAB — GLUCOSE, CAPILLARY
Glucose-Capillary: 99 mg/dL (ref 70–99)
Glucose-Capillary: 194 mg/dL — ABNORMAL HIGH (ref 70–99)
Glucose-Capillary: 147 mg/dL — ABNORMAL HIGH (ref 70–99)
Glucose-Capillary: 217 mg/dL — ABNORMAL HIGH (ref 70–99)

## 2020-08-03 LAB — ACID FAST SMEAR (AFB, MYCOBACTERIA): Acid Fast Smear: NEGATIVE

## 2020-08-03 LAB — PH, BODY FLUID: pH, Body Fluid: 7.7

## 2020-08-03 LAB — PHOSPHORUS: Phosphorus: 2.9 mg/dL (ref 2.5–4.6)

## 2020-08-03 LAB — MAGNESIUM: Magnesium: 1.9 mg/dL (ref 1.7–2.4)

## 2020-08-03 LAB — BRAIN NATRIURETIC PEPTIDE: B Natriuretic Peptide: 106.6 pg/mL — ABNORMAL HIGH (ref 0.0–100.0)

## 2020-08-03 MED ORDER — POTASSIUM CHLORIDE CRYS ER 20 MEQ PO TBCR
20.0000 meq | EXTENDED_RELEASE_TABLET | ORAL | Status: AC
  Administered 2020-08-03 (×2): 20 meq via ORAL
  Filled 2020-08-03 (×2): qty 1

## 2020-08-03 MED ORDER — FUROSEMIDE 10 MG/ML IJ SOLN
40.0000 mg | Freq: Two times a day (BID) | INTRAMUSCULAR | Status: DC
  Filled 2020-08-03: qty 4

## 2020-08-03 MED ORDER — POTASSIUM CHLORIDE CRYS ER 20 MEQ PO TBCR
40.0000 meq | EXTENDED_RELEASE_TABLET | Freq: Once | ORAL | Status: AC
  Administered 2020-08-03: 40 meq via ORAL
  Filled 2020-08-03: qty 2

## 2020-08-03 MED ORDER — MAGNESIUM SULFATE 2 GM/50ML IV SOLN
2.0000 g | Freq: Once | INTRAVENOUS | Status: AC
  Administered 2020-08-03: 2 g via INTRAVENOUS
  Filled 2020-08-03: qty 50

## 2020-08-03 MED ORDER — FUROSEMIDE 40 MG PO TABS: 40.0000 mg | ORAL_TABLET | Freq: Every day | ORAL | Status: DC

## 2020-08-03 MED ORDER — POTASSIUM CHLORIDE 10 MEQ/100ML IV SOLN
10.0000 meq | INTRAVENOUS | Status: DC
  Administered 2020-08-03 (×3): 10 meq via INTRAVENOUS
  Filled 2020-08-03 (×3): qty 100

## 2020-08-03 MED ORDER — FUROSEMIDE 10 MG/ML IJ SOLN: 20.0000 mg | Freq: Two times a day (BID) | INTRAMUSCULAR | Status: DC

## 2020-08-03 MED ORDER — LOSARTAN POTASSIUM 50 MG PO TABS
50.0000 mg | ORAL_TABLET | Freq: Every day | ORAL | Status: DC
  Administered 2020-08-04 – 2020-08-06 (×3): 50 mg via ORAL
  Filled 2020-08-03 (×3): qty 1

## 2020-08-03 NOTE — Progress Notes (Signed)
[facility] ADULT ICU REPLACEMENT PROTOCOL   The patient does apply for the [facility] Adult ICU Electrolyte Replacment Protocol based on the criteria listed below:   1.Exclusion criteria: TCTS patients, ECMO patients and Hypothermia Protocol, and   Dialysis patients 2. Is GFR >/= 30 ml/min? Yes.    Patient's GFR today is >60 3. Is SCr </= 2? Yes.   Patient's SCr is 0.72 mg/dL 4. Did SCr increase >/= 0.5 in 24 hours? No. 5.Pt's weight >40kg  Yes.   6. Abnormal electrolyte(s): K+ 3.3, Mag 1.9  7. Electrolytes replaced per protocol 8.  Call MD STAT for K+ </= 2.5, Phos </= 1, or Mag </= 1 Physician:  Dr. Carol Ada, [place] Grumbling 08/03/2020 5:02 AM

## 2020-08-03 NOTE — Progress Notes (Signed)
Pt refuses 40 mg lasix. schedule tonight.  Said she do not want to go to the bathroom all night. Pure wick offered,refuse and said she do not like it. Said she is going home tomorrow and do not need all this. NP on call made aware.

## 2020-08-03 NOTE — Progress Notes (Signed)
PROGRESS NOTE  [name] [id number] DOB: 1945/10/13   PCP: Lujean Amel, MD  Patient is from: Home  DOA: 07/29/2020 LOS: 4  Chief complaints:  Chief Complaint  Patient presents with   Fatigue     Brief Narrative / Interim history: 75 year old F with PMH of COPD/chronic hypoxic RF on 4 L, PAF not on AC, diastolic CHF, DM-2, HTN, EtOH and tobacco use disorder presenting to ED with lethargy, shortness of breath and confusion, and admitted for acute on chronic RF due to COPD exacerbation, A. fib with RVR and a small to moderate right pleural effusion.  Patient was managed with BiPAP, systemic steroid, antibiotics and nebulizers with improvement in his symptoms.  PCCM and palliative medicine following.  Subjective: Seen and examined earlier this morning.  No major events overnight of this morning.  No complaints.  She denies shortness of breath, chest pain, GI or UTI symptoms.  She wore her BiPAP last night.  Objective: Vitals:   08/03/20 0900 08/03/20 0959 08/03/20 1000 08/03/20 1100  BP:   (!) 182/49   Pulse: 75  78 73  Resp: '18  17 20  '$ Temp:      TempSrc:      SpO2: 96% 94% 100% 95%  Weight:      Height:        Intake/Output Summary (Last 24 hours) at 08/03/2020 1156 Last data filed at 08/03/2020 0919 Gross per 24 hour  Intake 862.49 ml  Output 1950 ml  Net -1087.51 ml   Filed Weights   07/29/20 1720 07/30/20 0201  Weight: 87 kg 90.3 kg    Examination:  GENERAL: No apparent distress.  Nontoxic. HEENT: MMM.  Vision and hearing grossly intact.  NECK: Supple.  No apparent JVD.  RESP: 95% on 4 L.  No IWOB.  Diminished aeration bilaterally. CVS:  RRR. Heart sounds normal.  ABD/GI/GU: BS+. Abd soft, NTND.  MSK/EXT:  Moves extremities. No apparent deformity. No edema.  SKIN: no apparent skin lesion or wound NEURO: Awake and alert. Oriented appropriately.  No apparent focal neuro deficit. PSYCH: Calm. Normal affect.   Procedures:  None  Microbiology  summarized: [id number] and influenza PCR nonreactive. Full RVP panel negative. Right pleural fluid culture NGTD. AFB and fungal culture pending.  Assessment & Plan: Acute on chronic respiratory failure with hypoxia and hypercapnia, POA: On 4 L at baseline.  Improved. COPD exacerbation-improving. Moderate right, small left pleural effusion Bibasilar atelectasis/consolidation/diffuse bilateral bronchial wall thickening -S/p right thoracocentesis with removal of 825 cc exudative and culture negative fluid -Received IV cefepime 7/27-7/28. -Continue BiPAP nightly.  Minimum oxygen to keep saturation above 88% while awake. -Continue Solu-Medrol, Yupelri and as needed Xopenex and Atrovent -Discontinue beta-blocker.  Use Cardizem instead -Encourage incentive telemetry/OOB/PT/OT -PCCM recommends trilogy versus BiPAP nightly on discharge.  TOC assisting with acquisition -Palliative medicine following.   Paroxysmal atrial fibrillation with RVR: Rate controlled.  On Toprol-XL at home.  Not on AC. -Discontinued Toprol XL and started Cardizem CD 180 mg daily on 7/30 -P.o. Cardizem 30 mg every 6 hours as needed  Acute on chronic diastolic CHF/PAH: TTE LVEF of 60 to 65%, G1 DD and RVSP of 49.  BNP 360>> 106.  Imaging with small left and moderate right pleural effusion.  Appears euvolemic on exam.  Cr stable. -Increase IV Lasix to 40 mg twice daily -Monitor fluid status, renal functions and electrolytes -Replenish electrolytes as appropriate  Pretracheal/hilar lymph nodes-noted on high-resolution CT on 7/27. -Needs follow-up imaging   Essential  hypertension: SBP in 160s.  DBP low 40s. -Continue home losartan -Cardizem and diuretics as above  Controlled DM-2 with hyperglycemia/hyperlipidemia: A1c 5.8% on 7/27.  On metformin and Jardiance at home.  Hyperglycemia likely due to steroid Recent Labs  Lab 08/02/20 1138 08/02/20 1546 08/02/20 2110 08/03/20 0741 08/03/20 1147  GLUCAP 386* 217* 172* 99  194*  -Continue SSI-moderate -Continue NovoLog 4 units 3 times daily with meals -Continue Lantus 10 units twice daily -Continue home Lipitor  Hyponatremia-due to hypervolemia?  Losartan?.  TSH within normal. -Urine sodium and osmolality is not going to be reliable -Increase IV Lasix to 40 mg twice daily -Decrease losartan to 50 mg daily-starting tomorrow. -May have to hold losartan if no improvement but I am not sure if BP allows.  Depression: Stable -Continue paroxetine 20 mg p.o. daily   EtOH abuse disorder -CIWAA protocol -Folic acid, multivitamin, thiamine   Tobacco use disorder -Encourage tobacco cessation -Continue nicotine patch   Intertrigo -Diflucan 150 mg p.o. weekly x 4 weeks, topical nystatin 3 times daily   Hypokalemia/hypomagnesemia/hypophosphatemia: K 3.3.  Mg 1.9. -K-Dur 40 mill equivalent x2   Acute urinary retention/bladder over distention-resolved. -Closely monitor urine output   Weakness/deconditioning/debility: Therapy recommended SNF but she prefers to go home. -Continue therapy while inpatient   Goals of care: Recurrent hospitalization with medical noncompliance and continued EtOH and tobacco abuse at home.  Still full code -Palliative care following.   Increased nutrient needs Body mass index is 30.27 kg/m. Nutrition Problem: Increased nutrient needs Etiology: acute illness Signs/Symptoms: estimated needs Interventions: MVI, Ensure Enlive (each supplement provides 350kcal and 20 grams of protein), Prostat Pressure Injury 09/22/19 Ankle Right;Lateral Stage 1 -  Intact skin with non-blanchable redness of a localized area usually over a bony prominence. (Active)  09/22/19 1530  Location: Ankle  Location Orientation: Right;Lateral  Staging: Stage 1 -  Intact skin with non-blanchable redness of a localized area usually over a bony prominence.  Wound Description (Comments):   Present on Admission: Yes     Pressure Injury 09/22/19 Heel Left Stage  1 -  Intact skin with non-blanchable redness of a localized area usually over a bony prominence. (Active)  09/22/19 1530  Location: Heel  Location Orientation: Left  Staging: Stage 1 -  Intact skin with non-blanchable redness of a localized area usually over a bony prominence.  Wound Description (Comments):   Present on Admission: Yes   DVT prophylaxis:  enoxaparin (LOVENOX) injection 40 mg Start: 07/30/20 1000  Code Status: Full code Family Communication: Patient and/or RN. Available if any question.  Level of care: Progressive Status is: Inpatient  Remains inpatient appropriate because:Persistent severe electrolyte disturbances, Unsafe d/c plan, IV treatments appropriate due to intensity of illness or inability to take PO, and Inpatient level of care appropriate due to severity of illness  Dispo:  Patient From: Home  Planned Disposition: Likely home versus SNF.   Medically stable for discharge: No         Consultants:  Pulmonology Palliative medicine   Sch Meds:  Scheduled Meds:  (feeding supplement) PROSource Plus  30 mL Oral BID BM   chlorhexidine  15 mL Mouth Rinse BID   Chlorhexidine Gluconate Cloth  6 each Topical Daily   diltiazem  180 mg Oral Daily   enoxaparin (LOVENOX) injection  40 mg Subcutaneous Q24H   feeding supplement  237 mL Oral Q24H   [START ON 08/07/2020] fluconazole  150 mg Oral Weekly   folic acid  1 mg Oral Daily  furosemide  20 mg Intravenous Q12H   insulin aspart  0-15 Units Subcutaneous TID WC   insulin aspart  0-5 Units Subcutaneous QHS   insulin aspart  4 Units Subcutaneous TID WC   insulin glargine-yfgn  10 Units Subcutaneous BID   losartan  100 mg Oral Daily   mouth rinse  15 mL Mouth Rinse q12n4p   methylPREDNISolone (SOLU-MEDROL) injection  60 mg Intravenous Q24H   multivitamin with minerals  1 tablet Oral Daily   nystatin   Topical TID   PARoxetine  20 mg Oral Daily   potassium chloride  40 mEq Oral Once   revefenacin  175 mcg  Nebulization Daily   thiamine  100 mg Oral Daily   Or   thiamine  100 mg Intravenous Daily   Continuous Infusions: PRN Meds:.diltiazem, levalbuterol, melatonin  Antimicrobials: Anti-infectives (From admission, onward)    Start     Dose/Rate Route Frequency Ordered Stop   08/07/20 1000  fluconazole (DIFLUCAN) tablet 150 mg        150 mg Oral Weekly 07/31/20 1415 08/28/20 0959   07/30/20 1000  fluconazole (DIFLUCAN) tablet 100 mg  Status:  Discontinued        100 mg Oral Daily 07/30/20 0810 07/31/20 1415   07/30/20 0600  ceFEPIme (MAXIPIME) 2 g in sodium chloride 0.9 % 100 mL IVPB  Status:  Discontinued        2 g 200 mL/hr over 30 Minutes Intravenous Every 8 hours 07/30/20 0309 07/31/20 0959        I have personally reviewed the following labs and images: CBC: Recent Labs  Lab 07/29/20 1947 07/30/20 0653 07/31/20 0245 08/01/20 0242 08/02/20 0220 08/03/20 0223  WBC 7.0 7.1 4.0 4.8 8.0 8.2  NEUTROABS 5.6  --   --   --   --   --   HGB 12.3 12.1 11.4* 10.4* 11.4* 12.2  HCT 39.7 38.2 34.4* 31.8* 33.8* 36.3  MCV 107.3* 105.2* 100.3* 99.7 100.0 99.7  PLT 94* 92* 106* 120* 138* 142*   BMP &GFR Recent Labs  Lab 07/30/20 0500 07/30/20 0653 07/31/20 0245 08/01/20 0242 08/02/20 0216 08/02/20 0220 08/03/20 0223  NA  --  133* 131* 128*  --  128* 126*  K  --  4.7 4.1 3.7  --  3.1* 3.3*  CL  --  80* 82* 80*  --  75* 74*  CO2  --  44* 39* 38*  --  41* 41*  GLUCOSE  --  102* 148* 145*  --  135* 132*  BUN  --  20 26* 29*  --  45* 39*  CREATININE  --  0.39* 0.53 0.66  --  0.77 0.72  CALCIUM  --  9.1 8.9 8.4*  --  8.6* 8.8*  MG 1.8  --  1.8 2.0 1.9  --  1.9  PHOS 2.4*  --  1.7* 4.2  --  2.9 2.9  3.0   Estimated Creatinine Clearance: 72.6 mL/min (by C-G formula based on SCr of 0.72 mg/dL). Liver & Pancreas: Recent Labs  Lab 07/29/20 1947 07/31/20 0245 08/01/20 0242 08/02/20 0220 08/03/20 0223  AST '21 16 18 22  '$ --   ALT '24 20 23 29  '$ --   ALKPHOS 58 53 48 59  --    BILITOT 0.6 0.7 0.7 0.6  --   PROT 6.0* 5.5* 5.3* 5.7*  --   ALBUMIN 3.4* 3.1* 2.9* 3.2* 3.3*   No results for input(s): LIPASE, AMYLASE in the last  168 hours. No results for input(s): AMMONIA in the last 168 hours. Diabetic: No results for input(s): HGBA1C in the last 72 hours. Recent Labs  Lab 08/02/20 1138 08/02/20 1546 08/02/20 2110 08/03/20 0741 08/03/20 1147  GLUCAP 386* 217* 172* 99 194*   Cardiac Enzymes: No results for input(s): CKTOTAL, CKMB, CKMBINDEX, TROPONINI in the last 168 hours. No results for input(s): PROBNP in the last 8760 hours. Coagulation Profile: No results for input(s): INR, PROTIME in the last 168 hours. Thyroid Function Tests: No results for input(s): TSH, T4TOTAL, FREET4, T3FREE, THYROIDAB in the last 72 hours. Lipid Profile: No results for input(s): CHOL, HDL, LDLCALC, TRIG, CHOLHDL, LDLDIRECT in the last 72 hours. Anemia Panel: No results for input(s): VITAMINB12, FOLATE, FERRITIN, TIBC, IRON, RETICCTPCT in the last 72 hours. Urine analysis:    Component Value Date/Time   COLORURINE YELLOW 07/30/2020 0807   APPEARANCEUR CLEAR 07/30/2020 0807   LABSPEC 1.016 07/30/2020 0807   PHURINE 5.0 07/30/2020 0807   GLUCOSEU 50 (A) 07/30/2020 0807   HGBUR NEGATIVE 07/30/2020 0807   BILIRUBINUR NEGATIVE 07/30/2020 0807   KETONESUR NEGATIVE 07/30/2020 0807   PROTEINUR 30 (A) 07/30/2020 0807   NITRITE NEGATIVE 07/30/2020 0807   LEUKOCYTESUR NEGATIVE 07/30/2020 0807   Sepsis Labs: Invalid input(s): PROCALCITONIN, [place]  Microbiology: Recent Results (from the past 240 hour(s))  Resp Panel by RT-PCR (Flu A&B, Covid) Nasopharyngeal Swab     Status: None   Collection Time: 07/29/20  8:23 PM   Specimen: Nasopharyngeal Swab; Nasopharyngeal(NP) swabs in vial transport medium  Result Value Ref Range Status   SARS Coronavirus 2 by RT PCR NEGATIVE NEGATIVE Final    Comment: (NOTE) SARS-CoV-2 target nucleic acids are NOT DETECTED.  The SARS-CoV-2  RNA is generally detectable in upper respiratory specimens during the acute phase of infection. The lowest concentration of SARS-CoV-2 viral copies this assay can detect is 138 copies/mL. A negative result does not preclude SARS-Cov-2 infection and should not be used as the sole basis for treatment or other patient management decisions. A negative result may occur with  improper specimen collection/handling, submission of specimen other than nasopharyngeal swab, presence of viral mutation(s) within the areas targeted by this assay, and inadequate number of viral copies(<138 copies/mL). A negative result must be combined with clinical observations, patient history, and epidemiological information. The expected result is Negative.  Fact Sheet for Patients:  EntrepreneurPulse.com.au  Fact Sheet for Healthcare Providers:  IncredibleEmployment.be  This test is no t yet approved or cleared by the [place] FDA and  has been authorized for detection and/or diagnosis of SARS-CoV-2 by FDA under an Emergency Use Authorization (EUA). This EUA will remain  in effect (meaning this test can be used) for the duration of the COVID-19 declaration under Section 564(b)(1) of the Act, 21 U.S.C.section 360bbb-3(b)(1), unless the authorization is terminated  or revoked sooner.       Influenza A by PCR NEGATIVE NEGATIVE Final   Influenza B by PCR NEGATIVE NEGATIVE Final    Comment: (NOTE) The Xpert Xpress SARS-CoV-2/FLU/RSV plus assay is intended as an aid in the diagnosis of influenza from Nasopharyngeal swab specimens and should not be used as a sole basis for treatment. Nasal washings and aspirates are unacceptable for Xpert Xpress SARS-CoV-2/FLU/RSV testing.  Fact Sheet for Patients: EntrepreneurPulse.com.au  Fact Sheet for Healthcare Providers: IncredibleEmployment.be  This test is not yet approved or cleared by the  [place] FDA and has been authorized for detection and/or diagnosis of SARS-CoV-2 by FDA under an  Emergency Use Authorization (EUA). This EUA will remain in effect (meaning this test can be used) for the duration of the COVID-19 declaration under Section 564(b)(1) of the Act, 21 U.S.C. section 360bbb-3(b)(1), unless the authorization is terminated or revoked.  Performed at [facility], [place] [address] [place], [place] [address]   Respiratory (~20 pathogens) panel by PCR     Status: None   Collection Time: 07/30/20 10:58 AM   Specimen: Nasopharyngeal Swab; Respiratory  Result Value Ref Range Status   Adenovirus NOT DETECTED NOT DETECTED Final   Coronavirus 229E NOT DETECTED NOT DETECTED Final    Comment: (NOTE) The Coronavirus on the Respiratory Panel, DOES NOT test for the novel  Coronavirus (2019 nCoV)    Coronavirus HKU1 NOT DETECTED NOT DETECTED Final   Coronavirus NL63 NOT DETECTED NOT DETECTED Final   Coronavirus OC43 NOT DETECTED NOT DETECTED Final   Metapneumovirus NOT DETECTED NOT DETECTED Final   Rhinovirus / Enterovirus NOT DETECTED NOT DETECTED Final   Influenza A NOT DETECTED NOT DETECTED Final   Influenza B NOT DETECTED NOT DETECTED Final   Parainfluenza Virus 1 NOT DETECTED NOT DETECTED Final   Parainfluenza Virus 2 NOT DETECTED NOT DETECTED Final   Parainfluenza Virus 3 NOT DETECTED NOT DETECTED Final   Parainfluenza Virus 4 NOT DETECTED NOT DETECTED Final   Respiratory Syncytial Virus NOT DETECTED NOT DETECTED Final   Bordetella pertussis NOT DETECTED NOT DETECTED Final   Bordetella Parapertussis NOT DETECTED NOT DETECTED Final   Chlamydophila pneumoniae NOT DETECTED NOT DETECTED Final   Mycoplasma pneumoniae NOT DETECTED NOT DETECTED Final    Comment: Performed at [facility], [place]. [address], [place], [place] [address]  Body fluid culture w Gram Stain     Status: None (Preliminary result)   Collection Time: 07/31/20  1:50 PM    Specimen: Pleura; Body Fluid  Result Value Ref Range Status   Specimen Description   Final    PLEURAL RIGHT Performed at [place] [address] [place], [place] [address]    Special Requests   Final    Normal Performed at [facility], [place] [address], [place], [place] [address]    Gram Stain   Final    RARE WBC PRESENT, PREDOMINANTLY MONONUCLEAR NO ORGANISMS SEEN    Culture   Final    NO GROWTH 3 DAYS Performed at [facility], [place] [address], [place], [place] [address]    Report Status PENDING  Incomplete  Acid Fast Smear (AFB)     Status: None   Collection Time: 07/31/20  1:50 PM   Specimen: A: PATH Cytology Pleural fluid   B: PATH Cytology Pleural fluid  Result Value Ref Range Status   AFB Specimen Processing Concentration  Final   Acid Fast Smear Negative  Final    Comment: (NOTE) Performed At: [facility] [place], [place] [id number] Rush Farmer MD [id number]    Source (AFB) PLEURAL  Final    Comment: RIGHT Performed at [facility], [place] [address] [place], [place] [address]     Radiology Studies: No results found.    Taison Celani T. [place]  If 7PM-7AM, please contact night-coverage [email or web address] 08/03/2020, 11:56 AM

## 2020-08-04 DIAGNOSIS — I5033 Acute on chronic diastolic (congestive) heart failure: Secondary | ICD-10-CM | POA: Diagnosis not present

## 2020-08-04 DIAGNOSIS — J9621 Acute and chronic respiratory failure with hypoxia: Secondary | ICD-10-CM | POA: Diagnosis not present

## 2020-08-04 DIAGNOSIS — J90 Pleural effusion, not elsewhere classified: Secondary | ICD-10-CM | POA: Diagnosis not present

## 2020-08-04 DIAGNOSIS — J9622 Acute and chronic respiratory failure with hypercapnia: Secondary | ICD-10-CM | POA: Diagnosis not present

## 2020-08-04 DIAGNOSIS — J441 Chronic obstructive pulmonary disease with (acute) exacerbation: Secondary | ICD-10-CM | POA: Diagnosis not present

## 2020-08-04 LAB — BODY FLUID CULTURE W GRAM STAIN
Culture: NO GROWTH
Special Requests: NORMAL

## 2020-08-04 LAB — MAGNESIUM: Magnesium: 2.3 mg/dL (ref 1.7–2.4)

## 2020-08-04 LAB — CBC
HCT: 38.5 % (ref 36.0–46.0)
Hemoglobin: 12.8 g/dL (ref 12.0–15.0)
MCH: 33.5 pg (ref 26.0–34.0)
MCHC: 33.2 g/dL (ref 30.0–36.0)
MCV: 100.8 fL — ABNORMAL HIGH (ref 80.0–100.0)
Platelets: 184 10*3/uL (ref 150–400)
RBC: 3.82 MIL/uL — ABNORMAL LOW (ref 3.87–5.11)
RDW: 12.6 % (ref 11.5–15.5)
WBC: 11.8 10*3/uL — ABNORMAL HIGH (ref 4.0–10.5)
nRBC: 0 % (ref 0.0–0.2)

## 2020-08-04 LAB — GLUCOSE, CAPILLARY
Glucose-Capillary: 86 mg/dL (ref 70–99)
Glucose-Capillary: 112 mg/dL — ABNORMAL HIGH (ref 70–99)
Glucose-Capillary: 339 mg/dL — ABNORMAL HIGH (ref 70–99)
Glucose-Capillary: 283 mg/dL — ABNORMAL HIGH (ref 70–99)

## 2020-08-04 LAB — RENAL FUNCTION PANEL
Albumin: 3.2 g/dL — ABNORMAL LOW (ref 3.5–5.0)
Anion gap: 10 (ref 5–15)
BUN: 42 mg/dL — ABNORMAL HIGH (ref 8–23)
CO2: 39 mmol/L — ABNORMAL HIGH (ref 22–32)
Calcium: 8.7 mg/dL — ABNORMAL LOW (ref 8.9–10.3)
Chloride: 79 mmol/L — ABNORMAL LOW (ref 98–111)
Creatinine, Ser: 0.62 mg/dL (ref 0.44–1.00)
GFR, Estimated: >60 mL/min (ref >60)
Glucose, Bld: 102 mg/dL — ABNORMAL HIGH (ref 70–99)
Phosphorus: 3 mg/dL (ref 2.5–4.6)
Potassium: 3.9 mmol/L (ref 3.5–5.1)
Sodium: 128 mmol/L — ABNORMAL LOW (ref 135–145)

## 2020-08-04 MED ORDER — FLUTICASONE FUROATE-VILANTEROL 100-25 MCG/INH IN AEPB
1.0000 | INHALATION_SPRAY | Freq: Every day | RESPIRATORY_TRACT | Status: DC
  Administered 2020-08-04 – 2020-08-06 (×3): 1 via RESPIRATORY_TRACT
  Filled 2020-08-04: qty 28

## 2020-08-04 MED ORDER — UMECLIDINIUM BROMIDE 62.5 MCG/INH IN AEPB
1.0000 | INHALATION_SPRAY | Freq: Every day | RESPIRATORY_TRACT | Status: DC
  Administered 2020-08-04 – 2020-08-06 (×3): 1 via RESPIRATORY_TRACT
  Filled 2020-08-04: qty 7

## 2020-08-04 MED ORDER — FUROSEMIDE 10 MG/ML IJ SOLN
40.0000 mg | Freq: Every day | INTRAMUSCULAR | Status: DC
  Administered 2020-08-04 – 2020-08-06 (×3): 40 mg via INTRAVENOUS
  Filled 2020-08-04 (×3): qty 4

## 2020-08-04 MED ORDER — PREDNISONE 20 MG PO TABS
40.0000 mg | ORAL_TABLET | Freq: Every day | ORAL | Status: DC
  Administered 2020-08-05: 40 mg via ORAL
  Filled 2020-08-04: qty 2

## 2020-08-04 NOTE — Progress Notes (Signed)
SATURATION QUALIFICATIONS: (This note is used to comply with regulatory documentation for home oxygen)  Patient Saturations on 3 Liters at Rest = 95%  Patient Saturations on 3 Liters while Ambulating = 92%  Patient Saturations on 3 Liters of oxygen while Ambulating = 92%  Please briefly explain why patient needs home oxygen: at rest pulse 85, O2 95% pt unable to ambulate hallway or room at this time only able to march in place due to back pain w/ marching in place pulse 106, O2 92%

## 2020-08-04 NOTE — TOC Progression Note (Signed)
Transition of Care [facility]) - Progression Note    Patient Details  Name: [name] MRN: [id number] Date of Birth: Aug 09, 1945  [facility]) CM/SW Contact  Altheria Shadoan, Juliann Pulse, RN Phone Number: 08/04/2020, 2:44 PM  Clinical Narrative: Patient declines SNF-wants home. No preference for [place] agency-[place] rep Levada Dy accepted for HHPT. [facility] care services for otpt palliative care-patient agreed to her brother [name] as contact person. [employer] following for Trilogy auth-await outcome.already has [employer] for home 02-spoke to James-following. [name] brothere aware to bring travel home 02 tank to hospital @ d/c. Continue to monitor.    Expected Discharge Plan: [place] Barriers to Discharge: Continued Medical Work up  Expected Discharge Plan and Services Expected Discharge Plan: [place]   Discharge Planning Services: CM Consult Post Acute Care Choice: Resumption of Svcs/PTA Provider Living arrangements for the past 2 months: Single Family Home                           HH Arranged: PT   Date HH Agency Contacted: 08/04/20 Time [place]: [id number] Representative spoke with at [place]: [place] ([place]) Interventions    Readmission Risk Interventions Readmission Risk Prevention Plan 08/04/2020  PCP or Specialist Appt within 3-5 Days Complete  HRI or [place] Complete  Social Work Consult for [place] Planning/Counseling Complete  Palliative Care Screening Complete  Medication Review Press photographer) Complete  Some recent data might be hidden

## 2020-08-04 NOTE — Progress Notes (Signed)
RT went to check on pt to see when she would like to go on the BIPAP. Pt said she rather not wear it tonight. Advised pt it will be beneficial if she wears it but pt refused for tonight. Told pt to let the RN know if she changes her mind. No resp distress noted, pt on 2L [place].

## 2020-08-04 NOTE — Progress Notes (Addendum)
PROGRESS NOTE  [name] [id number] DOB: November 16, 1945   PCP: Lujean Amel, MD  Patient is from: Home  DOA: 07/29/2020 LOS: 5  Chief complaints:  Chief Complaint  Patient presents with   Fatigue     Brief Narrative / Interim history: 75 year old F with PMH of COPD/chronic hypoxic RF on 4 L, PAF not on AC, diastolic CHF, DM-2, HTN, EtOH and tobacco use disorder presenting to ED with lethargy, shortness of breath and confusion, and admitted for acute on chronic RF due to COPD exacerbation, A. fib with RVR and a small to moderate right pleural effusion.  Patient was managed with BiPAP, systemic steroid, antibiotics and nebulizers with improvement in her symptoms.  PCCM and palliative medicine following.  Therapy recommended SNF but patient prefers to go home with home health.  Waiting on home trilogy machine.   Subjective: Seen and examined earlier this morning and this afternoon.  No major events overnight of this morning.  No complaints.  She denies shortness of breath, chest pain, GI or UTI symptoms.  She refused her Lasix last night.  Wore her BiPAP last night.  Objective: Vitals:   08/04/20 0428 08/04/20 0657 08/04/20 0817 08/04/20 1302  BP: (!) 173/63 (!) 149/71  (!) 153/53  Pulse: 71 97  79  Resp: 16   16  Temp: (!) 97.5 F (36.4 C)   (!) 97.5 F (36.4 C)  TempSrc: Oral   Oral  SpO2: 97%  96% 91%  Weight:      Height:        Intake/Output Summary (Last 24 hours) at 08/04/2020 1524 Last data filed at 08/04/2020 1300 Gross per 24 hour  Intake 1110 ml  Output --  Net 1110 ml   Filed Weights   07/29/20 1720 07/30/20 0201  Weight: 87 kg 90.3 kg    Examination:  GENERAL: No apparent distress.  Nontoxic. HEENT: MMM.  Vision and hearing grossly intact.  NECK: Supple.  No apparent JVD.  RESP: 97% on 4 L at rest.  No IWOB.  Fair aeration bilaterally. CVS:  RRR. Heart sounds normal.  ABD/GI/GU: BS+. Abd soft, NTND.  MSK/EXT:  Moves extremities. No apparent  deformity.  Trace edema bilaterally. SKIN: no apparent skin lesion or wound NEURO: Awake and alert. Oriented appropriately.  No apparent focal neuro deficit. PSYCH: Calm. Normal affect.   Procedures:  None  Microbiology summarized: [id number] and influenza PCR nonreactive. Full RVP panel negative. Right pleural fluid culture NGTD. AFB and fungal culture pending.  Assessment & Plan: Acute on chronic respiratory failure with hypoxia and hypercapnia, POA: On 4 L at baseline.  Improved. COPD exacerbation-improving. Moderate right, small left pleural effusion Bibasilar atelectasis/consolidation/diffuse bilateral bronchial wall thickening -PCCM managing -S/p right thoracocentesis with removal of 825 cc exudative and culture negative fluid -Received IV cefepime 7/27-7/28. -Continue BiPAP nightly.  Minimum oxygen to keep saturation above 88% while awake.  Discussed with RN. -Transition to p.o. prednisone, Trelegy Ellipta with as needed Xopenex and ipratropium. -Encourage incentive telemetry/OOB/PT/OT -PCCM recommends trilogy.  TOC assisting with acquisition -Palliative medicine following.   Paroxysmal atrial fibrillation with RVR: Rate controlled.  On Toprol-XL at home.  Not on AC. -Discontinued Toprol XL and started Cardizem CD 180 mg daily on 7/30 -P.o. Cardizem 30 mg every 6 hours as needed  Acute on chronic diastolic CHF/PAH: TTE LVEF of 60 to 65%, G1 DD and RVSP of 49.  BNP 360>> 106.  Imaging with small left and moderate right pleural effusion.  Appears euvolemic  on exam.  Cr stable. -IV Lasix 40 mg daily -Monitor fluid status, renal functions and electrolytes -Replenish electrolytes as appropriate  Pretracheal/hilar lymph nodes-noted on high-resolution CT on 7/27. -Needs follow-up imaging   Essential hypertension: SBP in 140s.  DBP low 40s. -Continue home losartan -Cardizem and diuretics as above  Controlled DM-2 with hyperglycemia/hyperlipidemia: A1c 5.8% on 7/27.  On  metformin and Jardiance at home.  Hyperglycemia likely due to steroid Recent Labs  Lab 08/03/20 1147 08/03/20 1626 08/03/20 1956 08/04/20 0722 08/04/20 1136  GLUCAP 194* 147* 217* 86 112*  -Continue SSI-moderate -Continue NovoLog 4 units 3 times daily with meals -Continue Lantus 10 units twice daily -Continue home Lipitor  Hyponatremia-128. Due to hypervolemia?  Losartan?.  TSH within normal.  Improving. -Urine sodium and osmolality is not going to be reliable -IV Lasix 40 mg daily -Decrease losartan to 50 mg daily -May have to hold losartan if no improvement but I am not sure if BP allows.  Depression: Stable -Continue paroxetine 20 mg p.o. daily   EtOH abuse disorder -CIWAA protocol -Folic acid, multivitamin, thiamine   Tobacco use disorder -Encourage tobacco cessation -Continue nicotine patch   Intertrigo -Diflucan 150 mg p.o. weekly x 4 weeks, topical nystatin 3 times daily   Hypokalemia/hypomagnesemia/hypophosphatemia: Resolved.   Acute urinary retention/bladder over distention-resolved. -Closely monitor urine output   Weakness/deconditioning/debility: Therapy recommended SNF but she prefers to go home. -Continue therapy while inpatient   Goals of care: Recurrent hospitalization with medical noncompliance and continued EtOH and tobacco abuse at home.  Still full code -Palliative care following.   Increased nutrient needs Body mass index is 30.27 kg/m. Nutrition Problem: Increased nutrient needs Etiology: acute illness Signs/Symptoms: estimated needs Interventions: MVI, Ensure Enlive (each supplement provides 350kcal and 20 grams of protein), Prostat  DVT prophylaxis:  enoxaparin (LOVENOX) injection 40 mg Start: 07/30/20 1000  Code Status: Full code Family Communication: Patient and/or RN.  Attempted to call patient's brother for update but no answer. Level of care: Progressive Status is: Inpatient  Remains inpatient appropriate because:IV treatments  appropriate due to intensity of illness or inability to take PO and Inpatient level of care appropriate due to severity of illness  Dispo:  Patient From: Home  Planned Disposition: Likely home once home trilogy set up  Medically stable for discharge: No         Consultants:  Pulmonology Palliative medicine   Sch Meds:  Scheduled Meds:  (feeding supplement) PROSource Plus  30 mL Oral BID BM   chlorhexidine  15 mL Mouth Rinse BID   diltiazem  180 mg Oral Daily   enoxaparin (LOVENOX) injection  40 mg Subcutaneous Q24H   feeding supplement  237 mL Oral Q24H   [START ON 08/07/2020] fluconazole  150 mg Oral Weekly   fluticasone furoate-vilanterol  1 puff Inhalation Daily   folic acid  1 mg Oral Daily   insulin aspart  0-15 Units Subcutaneous TID WC   insulin aspart  0-5 Units Subcutaneous QHS   insulin aspart  4 Units Subcutaneous TID WC   insulin glargine-yfgn  10 Units Subcutaneous BID   losartan  50 mg Oral Daily   mouth rinse  15 mL Mouth Rinse q12n4p   multivitamin with minerals  1 tablet Oral Daily   nystatin   Topical TID   PARoxetine  20 mg Oral Daily   [START ON 08/05/2020] predniSONE  40 mg Oral Q breakfast   thiamine  100 mg Oral Daily   Or   thiamine  100 mg Intravenous Daily   umeclidinium bromide  1 puff Inhalation Daily   Continuous Infusions: PRN Meds:.diltiazem, levalbuterol, melatonin  Antimicrobials: Anti-infectives (From admission, onward)    Start     Dose/Rate Route Frequency Ordered Stop   08/07/20 1000  fluconazole (DIFLUCAN) tablet 150 mg        150 mg Oral Weekly 07/31/20 1415 08/28/20 0959   07/30/20 1000  fluconazole (DIFLUCAN) tablet 100 mg  Status:  Discontinued        100 mg Oral Daily 07/30/20 0810 07/31/20 1415   07/30/20 0600  ceFEPIme (MAXIPIME) 2 g in sodium chloride 0.9 % 100 mL IVPB  Status:  Discontinued        2 g 200 mL/hr over 30 Minutes Intravenous Every 8 hours 07/30/20 0309 07/31/20 0959        I have personally reviewed  the following labs and images: CBC: Recent Labs  Lab 07/29/20 1947 07/30/20 0653 07/31/20 0245 08/01/20 0242 08/02/20 0220 08/03/20 0223 08/04/20 0416  WBC 7.0   < > 4.0 4.8 8.0 8.2 11.8*  NEUTROABS 5.6  --   --   --   --   --   --   HGB 12.3   < > 11.4* 10.4* 11.4* 12.2 12.8  HCT 39.7   < > 34.4* 31.8* 33.8* 36.3 38.5  MCV 107.3*   < > 100.3* 99.7 100.0 99.7 100.8*  PLT 94*   < > 106* 120* 138* 142* 184   < > = values in this interval not displayed.   BMP &GFR Recent Labs  Lab 07/31/20 0245 08/01/20 0242 08/02/20 0216 08/02/20 0220 08/03/20 0223 08/04/20 0416  NA 131* 128*  --  128* 126* 128*  K 4.1 3.7  --  3.1* 3.3* 3.9  CL 82* 80*  --  75* 74* 79*  CO2 39* 38*  --  41* 41* 39*  GLUCOSE 148* 145*  --  135* 132* 102*  BUN 26* 29*  --  45* 39* 42*  CREATININE 0.53 0.66  --  0.77 0.72 0.62  CALCIUM 8.9 8.4*  --  8.6* 8.8* 8.7*  MG 1.8 2.0 1.9  --  1.9 2.3  PHOS 1.7* 4.2  --  2.9 2.9  3.0 3.0   Estimated Creatinine Clearance: 72.6 mL/min (by C-G formula based on SCr of 0.62 mg/dL). Liver & Pancreas: Recent Labs  Lab 07/29/20 1947 07/31/20 0245 08/01/20 0242 08/02/20 0220 08/03/20 0223 08/04/20 0416  AST '21 16 18 22  '$ --   --   ALT '24 20 23 29  '$ --   --   ALKPHOS 58 53 48 59  --   --   BILITOT 0.6 0.7 0.7 0.6  --   --   PROT 6.0* 5.5* 5.3* 5.7*  --   --   ALBUMIN 3.4* 3.1* 2.9* 3.2* 3.3* 3.2*   No results for input(s): LIPASE, AMYLASE in the last 168 hours. No results for input(s): AMMONIA in the last 168 hours. Diabetic: No results for input(s): HGBA1C in the last 72 hours. Recent Labs  Lab 08/03/20 1147 08/03/20 1626 08/03/20 1956 08/04/20 0722 08/04/20 1136  GLUCAP 194* 147* 217* 86 112*   Cardiac Enzymes: No results for input(s): CKTOTAL, CKMB, CKMBINDEX, TROPONINI in the last 168 hours. No results for input(s): PROBNP in the last 8760 hours. Coagulation Profile: No results for input(s): INR, PROTIME in the last 168 hours. Thyroid Function  Tests: No results for input(s): TSH, T4TOTAL, FREET4, T3FREE, THYROIDAB in the  last 72 hours. Lipid Profile: No results for input(s): CHOL, HDL, LDLCALC, TRIG, CHOLHDL, LDLDIRECT in the last 72 hours. Anemia Panel: No results for input(s): VITAMINB12, FOLATE, FERRITIN, TIBC, IRON, RETICCTPCT in the last 72 hours. Urine analysis:    Component Value Date/Time   COLORURINE YELLOW 07/30/2020 0807   APPEARANCEUR CLEAR 07/30/2020 0807   LABSPEC 1.016 07/30/2020 0807   PHURINE 5.0 07/30/2020 0807   GLUCOSEU 50 (A) 07/30/2020 0807   HGBUR NEGATIVE 07/30/2020 0807   BILIRUBINUR NEGATIVE 07/30/2020 0807   KETONESUR NEGATIVE 07/30/2020 0807   PROTEINUR 30 (A) 07/30/2020 0807   NITRITE NEGATIVE 07/30/2020 0807   LEUKOCYTESUR NEGATIVE 07/30/2020 0807   Sepsis Labs: Invalid input(s): PROCALCITONIN, [place]  Microbiology: Recent Results (from the past 240 hour(s))  Resp Panel by RT-PCR (Flu A&B, Covid) Nasopharyngeal Swab     Status: None   Collection Time: 07/29/20  8:23 PM   Specimen: Nasopharyngeal Swab; Nasopharyngeal(NP) swabs in vial transport medium  Result Value Ref Range Status   SARS Coronavirus 2 by RT PCR NEGATIVE NEGATIVE Final    Comment: (NOTE) SARS-CoV-2 target nucleic acids are NOT DETECTED.  The SARS-CoV-2 RNA is generally detectable in upper respiratory specimens during the acute phase of infection. The lowest concentration of SARS-CoV-2 viral copies this assay can detect is 138 copies/mL. A negative result does not preclude SARS-Cov-2 infection and should not be used as the sole basis for treatment or other patient management decisions. A negative result may occur with  improper specimen collection/handling, submission of specimen other than nasopharyngeal swab, presence of viral mutation(s) within the areas targeted by this assay, and inadequate number of viral copies(<138 copies/mL). A negative result must be combined with clinical observations, patient history,  and epidemiological information. The expected result is Negative.  Fact Sheet for Patients:  EntrepreneurPulse.com.au  Fact Sheet for Healthcare Providers:  IncredibleEmployment.be  This test is no t yet approved or cleared by the [place] FDA and  has been authorized for detection and/or diagnosis of SARS-CoV-2 by FDA under an Emergency Use Authorization (EUA). This EUA will remain  in effect (meaning this test can be used) for the duration of the COVID-19 declaration under Section 564(b)(1) of the Act, 21 U.S.C.section 360bbb-3(b)(1), unless the authorization is terminated  or revoked sooner.       Influenza A by PCR NEGATIVE NEGATIVE Final   Influenza B by PCR NEGATIVE NEGATIVE Final    Comment: (NOTE) The Xpert Xpress SARS-CoV-2/FLU/RSV plus assay is intended as an aid in the diagnosis of influenza from Nasopharyngeal swab specimens and should not be used as a sole basis for treatment. Nasal washings and aspirates are unacceptable for Xpert Xpress SARS-CoV-2/FLU/RSV testing.  Fact Sheet for Patients: EntrepreneurPulse.com.au  Fact Sheet for Healthcare Providers: IncredibleEmployment.be  This test is not yet approved or cleared by the [place] FDA and has been authorized for detection and/or diagnosis of SARS-CoV-2 by FDA under an Emergency Use Authorization (EUA). This EUA will remain in effect (meaning this test can be used) for the duration of the COVID-19 declaration under Section 564(b)(1) of the Act, 21 U.S.C. section 360bbb-3(b)(1), unless the authorization is terminated or revoked.  Performed at [facility], [place] [address] [place], [place] [address]   Respiratory (~20 pathogens) panel by PCR     Status: None   Collection Time: 07/30/20 10:58 AM   Specimen: Nasopharyngeal Swab; Respiratory  Result Value Ref Range Status   Adenovirus NOT DETECTED NOT DETECTED  Final   Coronavirus  229E NOT DETECTED NOT DETECTED Final    Comment: (NOTE) The Coronavirus on the Respiratory Panel, DOES NOT test for the novel  Coronavirus (2019 nCoV)    Coronavirus HKU1 NOT DETECTED NOT DETECTED Final   Coronavirus NL63 NOT DETECTED NOT DETECTED Final   Coronavirus OC43 NOT DETECTED NOT DETECTED Final   Metapneumovirus NOT DETECTED NOT DETECTED Final   Rhinovirus / Enterovirus NOT DETECTED NOT DETECTED Final   Influenza A NOT DETECTED NOT DETECTED Final   Influenza B NOT DETECTED NOT DETECTED Final   Parainfluenza Virus 1 NOT DETECTED NOT DETECTED Final   Parainfluenza Virus 2 NOT DETECTED NOT DETECTED Final   Parainfluenza Virus 3 NOT DETECTED NOT DETECTED Final   Parainfluenza Virus 4 NOT DETECTED NOT DETECTED Final   Respiratory Syncytial Virus NOT DETECTED NOT DETECTED Final   Bordetella pertussis NOT DETECTED NOT DETECTED Final   Bordetella Parapertussis NOT DETECTED NOT DETECTED Final   Chlamydophila pneumoniae NOT DETECTED NOT DETECTED Final   Mycoplasma pneumoniae NOT DETECTED NOT DETECTED Final    Comment: Performed at [facility], [place] [address], [place], [place] [address]  Body fluid culture w Gram Stain     Status: None   Collection Time: 07/31/20  1:50 PM   Specimen: Pleura; Body Fluid  Result Value Ref Range Status   Specimen Description   Final    PLEURAL RIGHT Performed at [place] [address] [place], [place] [address]    Special Requests   Final    Normal Performed at [facility], [place] [address] [place], [place] [address]    Gram Stain   Final    RARE WBC PRESENT, PREDOMINANTLY MONONUCLEAR NO ORGANISMS SEEN    Culture   Final    NO GROWTH 3 DAYS Performed at [facility], [place] [address] [place], [place] [address]    Report Status 08/04/2020 FINAL  Final  Acid Fast Smear (AFB)     Status: None   Collection Time: 07/31/20  1:50 PM   Specimen: A: PATH Cytology Pleural  fluid   B: PATH Cytology Pleural fluid  Result Value Ref Range Status   AFB Specimen Processing Concentration  Final   Acid Fast Smear Negative  Final    Comment: (NOTE) Performed At: [facility] [place], [place] [id number] Rush Farmer MD [id number]    Source (AFB) PLEURAL  Final    Comment: RIGHT Performed at [place] [address] [place], [place] [address]     Radiology Studies: No results found.    Makinzey Banes T. [place]  If 7PM-7AM, please contact night-coverage [email or web address] 08/04/2020, 3:24 PM

## 2020-08-04 NOTE — Progress Notes (Signed)
Manufacturing engineer [facility])  Hospital Liaison RN note         This patient has been referred for outpatient palliative services after discharge.  [place] liaisons will continue to follow.      Thank you for the opportunity to participate in this patient's care.     Domenic Moras, BSN, RN [facility] Liaison (listed on [facility] under Hospice[facility])    [phone] (24h on call)

## 2020-08-04 NOTE — Progress Notes (Signed)
SATURATION QUALIFICATIONS: (This note is used to comply with regulatory documentation for home oxygen)  Patient Saturations on 2 Liters at Rest = 95%  Patient Saturations on 2 Liters while Ambulating = 87%  Patient Saturations on 2 Liters of oxygen while Ambulating = 85%  Please briefly explain why patient needs home oxygen:

## 2020-08-04 NOTE — Progress Notes (Signed)
Occupational Therapy Treatment Patient Details Name: [name] MRN: [id number] DOB: December 15, 1945 Today's Date: 08/04/2020    History of present illness 75 yo F admitted 7/26 via ED due to increased weakness and lethargy. Has COPD (never had PFTs, been on o2 for years ), ongoing smoking, CHF, 4L baseline O2 requirement.  AT baseline lives alone with dog. Uses walker for mobility. Unable to navigate 3 steps without help. Never seen a pulmonary MD. Pt with daily etoh.  [facility]  7/19-7/25 with acute on chronic hypoxia and hypercapnea.   OT comments  Patient was noted to be making progress towards goals. Patient was noted to require min guard for transfer from recliner to [facility] with rolling walker and mod A to manage O2 cord. Patient completed toileting hygiene with min A to increase independence in ADLs. Patient was noted to have poor insight to deficits with patient requesting to go home multiple times during session when therapist had educated that MD makes d/c decisions. Patient recommended to have 24/7 sup/assistance at home if choosing to d/c home at this time.   Follow Up Recommendations  SNF    Equipment Recommendations  None recommended by OT    Recommendations for Other Services      Precautions / Restrictions Precautions Precautions: Fall Precaution Comments: monitor O2 Restrictions Weight Bearing Restrictions: No       Mobility Bed Mobility Overal bed mobility: Needs Assistance Bed Mobility: Supine to Sit     Supine to sit: Min guard     General bed mobility comments: slow, labored movement, comes to sitting EOB without physical assist, uses bedrail to upright trunk and scoot out to EOB    Transfers Overall transfer level: Needs assistance Equipment used: Rolling walker (2 wheeled) Transfers: Sit to/from [facility] Sit to Stand: Min guard Stand pivot transfers: Min guard       General transfer comment: powers to stand with  rocking momentum, weight slightly posterior with initial STS rep but improves with reps, able to pivot to recliner using RW and min guard, therapist managing lines for safety    Balance Overall balance assessment: Needs assistance Sitting-balance support: Feet supported Sitting balance-Leahy Scale: Good Sitting balance - Comments: seated EOB   Standing balance support: During functional activity;Bilateral upper extremity supported Standing balance-Leahy Scale: Fair Standing balance comment: reliant on UE support                           ADL either performed or assessed with clinical judgement   ADL   Eating/Feeding: Independent;Sitting   Grooming: Sitting;Set up                   Toilet Transfer: Min guard;RW   Toileting- Water quality scientist and Hygiene: Min guard;Sit to/from stand Toileting - Clothing Manipulation Details (indicate cue type and reason): with check to ensure patient complete hygiene thoughly. patient was continuously educated on deep breathing.     Functional mobility during ADLs: Min guard;Rolling walker       Vision Patient Visual Report: No change from baseline     Perception     Praxis      Cognition Arousal/Alertness: Awake/alert Behavior During Therapy: Flat affect Overall Cognitive Status: Within Functional Limits for tasks assessed                                 General  Comments: patient reported she wanted to go home today. patient was educated on deficits impacting home d/c being safe. patient reported that she was able to get home today with no issues. patient does not have good insight to deficits.        Exercises     Shoulder Instructions       General Comments patient was noted to destat to 80% after functional mobility to [facility] in room with 1 in to deep brath O2 saturation back up to 90% on 2L/min.    Pertinent Vitals/ Pain       Pain Assessment: Faces Faces Pain Scale: No hurt Pain  Location: back Pain Descriptors / Indicators:  (chronic) Pain Intervention(s): Limited activity within patient's tolerance;Monitored during session;Repositioned  Home Living                                          Prior Functioning/Environment              Frequency  Min 2X/week        Progress Toward Goals  OT Goals(current goals can now be found in the care plan section)  Progress towards OT goals: Progressing toward goals  Acute Rehab OT Goals Patient Stated Goal: to go home OT Goal Formulation: With patient Time For Goal Achievement: 08/13/20 Potential to Achieve Goals: Good  Plan Discharge plan remains appropriate    Co-evaluation                 AM-PAC OT "6 Clicks" Daily Activity     Outcome Measure   Help from another person eating meals?: A Little Help from another person taking care of personal grooming?: A Little Help from another person toileting, which includes using toliet, bedpan, or urinal?: A Little Help from another person bathing (including washing, rinsing, drying)?: A Little Help from another person to put on and taking off regular upper body clothing?: A Little Help from another person to put on and taking off regular lower body clothing?: A Lot 6 Click Score: 17    End of Session Equipment Utilized During Treatment: Gait belt;Rolling walker  OT Visit Diagnosis: Muscle weakness (generalized) (M62.81)   Activity Tolerance Patient tolerated treatment well   Patient Left in chair;with call bell/phone within reach   Nurse Communication          Time: [id number] OT Time Calculation (min): 16 min  Charges: OT General Charges $OT Visit: 1 Visit OT Treatments $Self Care/Home Management : 8-22 mins  Jackelyn Poling OTR/L, MS Acute Rehabilitation Department Office# [phone] Pager# [phone]    [place] 08/04/2020, 3:52 PM

## 2020-08-04 NOTE — Progress Notes (Signed)
NAME:  [name], MRN:  [id number], DOB:  02-21-1945, LOS: 5 ADMISSION DATE:  07/29/2020, CONSULTATION DATE:  7/27?22 REFERRING MD:  Dr [name], CHIEF COMPLAINT:  resp failure  PCP Koirala, Dibas, MD   BRIEF   75 yo F with COPD NOs (never had PFTs, beeon on o2 for years NOS), ongoing smoking, dCHF, 4L baseline O2 requirement.  AT baseline lives alone with dog. Uses walker for mobility. Unable to navigate 3 steps without help. Never seen a pulmonary MD. Never had PFt. Last CT chest 2018.  Chart reports daily etoh as well. No known drug use or benzo or opioid use.  Baseline functional status appears to be ECOG 3-4   Pt admitted to [facility]  7/19-7/25 with acute on chronic hypoxia and hypercapnea. With concern for RLL CAP NOS. Readmitted via ED 07/29/20 due to increased weakness and lethargy. Extreme resp acidosis in ER with pH 7.22 pCO2 of 119.  bicarb 46.  Salvaged with BIPAP and mental status and ABG improved. CCM consulted mornign of 07/30/20 for acute on chronic resp failure and recurrent admissions.  On mormning of 07/30/20 watching TV and oriented but seems to suffer from poor motivation and giving poor history. She is unclear how long she has been on o2 and using walker . REfers to her brother her caretaker for answerrs to many questions  At admit wbc normal, covid pcr/flu pcr neg, CXR with mild pleural effusion. BNO 360 (recently 237 in July). Per RN - no active wheezing (was wheezing in th ER)   Pertinent  Medical History   has a past medical history of Acute exacerbation of CHF (congestive heart failure) ([place]) (09/22/2019), COPD (chronic obstructive pulmonary disease) ([place]), Diabetes mellitus, Diastolic dysfunction, Hyperlipidemia, and Hypertension.   has a past surgical history that includes Tubal ligation.   Significant Hospital Events: Including procedures, antibiotic start and stop dates in addition to other pertinent events   07/29/2020 - admit 7/27 - ccm  consult 7/28  -  urine strep negative,  MRSA PCR - neg. Refusing bath -.  On 10L HFNC.  Trop normal. PCT negatiive x 2. In  AFib RVR this morning. - lopressors given. Med review shows prn hydralazine. Currently on BiPAP. Mag and phos low. CT chest with moderate R Pleural effusion , evidence of pulm htn and enlarged paratracheal nodes 7/29 - s. R Thora 896m clear fluid. TRANSUDATE. [place] concern for early liver cirrhosis- recommending elastography. She is down to 6L HFNC ater thora and lasix yesterday/today. Used BiPAP last night. Feeling better. Admits to "2 cup of etoh daily" . Does not want to go to SNF rehab. AGrees to follow with [facility] . [place] Was in A fib and converted to NSR. On oral cardizem  Interim History / Subjective:   [facility] Most recent abg 48h ago still with hypercapnia chronic pc02 60/. Seen by pallative - care : [place] code. [place] Will take home pall care. REjected snf rehab. Refused lasix and purewick last night. Wants to go home. Afebrile.  RVP neg. Useb BiPAP last night. She is agreeable to triolgy nippv at home. Wants to go home  Objective   Blood pressure (!) 149/71, pulse 97, temperature (!) 97.5 F (36.4 C), temperature source Oral, resp. rate 16, height '5\' 8"'$  (1.727 m), weight 90.3 kg, SpO2 96 %.    FiO2 (%):  [36 %] 36 %  Intake/Output Summary (Last 24 hours) at 08/04/2020 0939 Last data filed at 08/04/2020 0800 Gross per 24 hour  Intake 910 ml  Output --  Net 910 ml   Filed Weights   07/29/20 1720 07/30/20 0201  Weight: 87 kg 90.3 kg   General Appearance:   OBESE - +, deconditioned. Looks stable Head:  Normocephalic, without obvious abnormality, atraumatic Eyes:  PERRL - yes, conjunctiva/corneas - muddy     Ears:  Normal external ear canals, both ears Nose:  G tube - no bu has [place] Throat:  ETT TUBE - no , OG tube - no Neck:  Supple,  No enlargement/tenderness/nodules Lungs:  Clear to auscultation bilaterally,  Heart:  S1 and S2 normal, no murmur, CVP - no.  Pressors - nop Abdomen:  Soft, no masses, no organomegaly Genitalia / Rectal:  Not done Extremities:  Extremities- intact Skin:  ntact in exposed areas . Sacral area - not examined Neurologic:  Sedation - none -> RASS - +! Marland Kitchen Moves all 4s - yes. CAM-ICU - neg . Orientation - x3+          LABS  Results for [name] (MRN [id number]) as of 07/30/2020 09:21  Ref. Range 07/29/2020 19:47 07/29/2020 20:23 07/29/2020 23:24  pH, Ven Latest Ref Range: 7.250 - 7.430  7.222 (L)  7.343  pCO2, Ven Latest Ref Range: 44.0 - 60.0 mmHg 119 (HH)  78.7 (HH)  pO2, Ven Latest Ref Range: 32.0 - 45.0 mmHg 82.2 (H)  57.0 (H)    PULMONARY Recent Labs  Lab 07/29/20 1947 07/29/20 2324 08/02/20 0458  PHART  --   --  7.494*  PCO2ART  --   --  60.3*  PO2ART  --   --  98.6  HCO3 46.9* 41.6* 46.1*  O2SAT 94.9 88.3 97.4    CBC Recent Labs  Lab 08/02/20 0220 08/03/20 0223 08/04/20 0416  HGB 11.4* 12.2 12.8  HCT 33.8* 36.3 38.5  WBC 8.0 8.2 11.8*  PLT 138* 142* 184    COAGULATION No results for input(s): INR in the last 168 hours.  CARDIAC  No results for input(s): TROPONINI in the last 168 hours. No results for input(s): PROBNP in the last 168 hours.   CHEMISTRY Recent Labs  Lab 07/31/20 0245 08/01/20 0242 08/02/20 0216 08/02/20 0220 08/03/20 0223 08/04/20 0416  NA 131* 128*  --  128* 126* 128*  K 4.1 3.7  --  3.1* 3.3* 3.9  CL 82* 80*  --  75* 74* 79*  CO2 39* 38*  --  41* 41* 39*  GLUCOSE 148* 145*  --  135* 132* 102*  BUN 26* 29*  --  45* 39* 42*  CREATININE 0.53 0.66  --  0.77 0.72 0.62  CALCIUM 8.9 8.4*  --  8.6* 8.8* 8.7*  MG 1.8 2.0 1.9  --  1.9 2.3  PHOS 1.7* 4.2  --  2.9 2.9  3.0 3.0   Estimated Creatinine Clearance: 72.6 mL/min (by C-G formula based on SCr of 0.62 mg/dL).   LIVER Recent Labs  Lab 07/29/20 1947 07/31/20 0245 08/01/20 0242 08/02/20 0220 08/03/20 0223  08/04/20 0416  AST '21 16 18 22  '$ --   --   ALT '24 20 23 29  '$ --   --   ALKPHOS 58 53 48 59  --   --   BILITOT 0.6 0.7 0.7 0.6  --   --   PROT 6.0* 5.5* 5.3* 5.7*  --   --   ALBUMIN 3.4*  3.1* 2.9* 3.2* 3.3* 3.2*     INFECTIOUS Recent Labs  Lab 07/30/20 1049 07/30/20 1235 07/31/20 0245 08/01/20 0242  LATICACIDVEN 2.9*  --   --   --   PROCALCITON  --  <0.10 <0.10 <0.10     ENDOCRINE CBG (last 3)  Recent Labs    08/03/20 1626 08/03/20 1956 08/04/20 0722  GLUCAP 147* 217* 86         IMAGING x48h  - image(s) personally visualized  -   highlighted in bold No results found.   Resolved Hospital Problem list   x  Assessment & Plan:  Baseline prior to and at the time of admission  -Alcoholism -Self-neglect -Poor functional status ECOG 3-4 -Immobility -Severe physical deconditioning - Protein calorie malnurituion (alb < 4) at admit - mild to moderate -Heavy tobacco abuse -Chronic hypoxemic and hypercapnic respiratory failure without pulmonary support as outpatient -Baseline diastolic dysfunction grade 1   Current admission Acute on chronic hypoxemic and hypercapnic respiratory failure  with  R > L pleura effusion - at ADMIT -likely due to COPD exacerbation (recurrent) and worsening acute on chronic Diast CHF (Rt pleura effusion is transudate) - also  medication noncompliance after recent discharge and ongoing smoking as contirubioor factors - no eviddnece of MI - RVP  negative  New diagnosis of Mediastinal Nodes 07/30/20 on CT byt  likely present on admit  R > L pleural effusion (Transudate)  - present on admit, New    - etioogy - > ? Cirrhosis v Diast CHF   New onset  A Fibr RVR 07/30/20 - resolved 07/31/20. Now on po cardizme    8/1 - down to [facility] -   alb or xopenex prn - change to MDI at dc - dc  yupelri - start incruse + breo -> as opd change to consolidated triple inhaler - change solumedrol to [prednisone '40mg'$  - [facility] MD to do 2 week  taper -  [facility] BiPAP/NIPPV  - Patient's PAOLA KARAN with 06-11-1945  has chronic hypercapnic respiratory failure due to gold stage 4 copd is life threatening.  Previous ABG's have documented high PCO2 and spirometry reveals severe obstructive ventilatory defect.  Patient would benefit from non-invasive ventilation.  Without this therapy, the patient is at high risk of ending up with worsening symptoms, worsened respiratory failure, need for ER visits and/or recurrent hospitalizations.  Bilevel device unable to adequately support patient's nocturnal ventilation needs.  Patient would benefit from NIV therapy with set tidal volumes and pressure. - refused SNF --Needs outpatient pulmonary function test, alpha-1 antitrypsin phenotype check, rehab reefraall and tracking mediastinal nodes -  outpatient pulmonologist with [facility] will sign off. Can go home from ccm perspective   Best Practice (right click and "Reselect all SmartList Selections" daily)   According to the hospitalist but called brother Rosita Kea [phone] - could not reach 7/28 and kept rining 7/29   Future Appointments  Date Time Provider [place]  08/13/2020  4:00 PM Martyn Ehrich, NP [facility] None  10/07/2020  2:30 PM Lorretta Harp, MD [facility]      SIGNATURE    Dr. Brand Males, M.D., F.C.C.P,  Pulmonary and Critical Care Medicine Staff Physician, [place] Director - Interstitial Lung Disease  Program  Pulmonary [place] [facility] [place], [place], [address]  NPI Number:  NPI [id number]  Pager: [phone], If no answer  ->  Check AMION or Try [id number] Telephone (clinical office): [phone] Telephone (research): [phone]  9:39 AM 08/04/2020

## 2020-08-04 NOTE — Progress Notes (Signed)
Physical Therapy Treatment Patient Details Name: [name] MRN: [id number] DOB: July 06, 1945 Today's Date: 08/04/2020    History of Present Illness 75 yo F admitted 7/26 via ED due to increased weakness and lethargy. Has COPD (never had PFTs, been on o2 for years ), ongoing smoking, CHF, 4L baseline O2 requirement.  AT baseline lives alone with dog. Uses walker for mobility. Unable to navigate 3 steps without help. Never seen a pulmonary MD. Pt with daily etoh.  Recently admitted to [facility]  7/19-7/25 with acute on chronic hypoxia and hypercapnea.    PT Comments    Pt motivated to ambulate into hallway, but upon rising and performing standing marching pt fatigues requiring standing then seated rest break with pursed lip breathing. Pt with chronic back pain complaints, requests to transfer into recliner for comfort. Pt requires min guard with transfer to recliner, desats to 85% on 2L O2, cued for pursed lip breathing. Pt very limited by fatigue level, only able to perform standing marching this session. RN aware of pt's O2 on 2L; pt 90% on 2L at EOS while in chair.    Follow Up Recommendations  SNF     Equipment Recommendations  None recommended by PT    Recommendations for Other Services       Precautions / Restrictions Precautions Precautions: Fall Precaution Comments: monitor O2 Restrictions Weight Bearing Restrictions: No    Mobility  Bed Mobility Overal bed mobility: Needs Assistance Bed Mobility: Supine to Sit  Supine to sit: Min guard  General bed mobility comments: slow, labored movement, comes to sitting EOB without physical assist, uses bedrail to upright trunk and scoot out to EOB    Transfers Overall transfer level: Needs assistance Equipment used: Rolling walker (2 wheeled) Transfers: Sit to/from [facility] Sit to Stand: Min guard Stand pivot transfers: Min guard  General transfer comment: powers to stand with rocking momentum, weight  slightly posterior with initial STS rep but improves with reps, able to pivot to recliner using RW and min guard, therapist managing lines for safety  Ambulation/Gait Ambulation/Gait assistance: Min guard   Assistive device: Rolling walker (2 wheeled)  General Gait Details: pt performs standing marching with RW, initial LOB but able to recover independently, fatigues quickly with RW requiring standing rest break after 20 seconds, desats to 87% on 2L O2; declines further ambulation due to back pain and fatigue   Stairs             Wheelchair Mobility    Modified Rankin (Stroke Patients Only)       Balance Overall balance assessment: Needs assistance Sitting-balance support: Feet supported Sitting balance-Leahy Scale: Good Sitting balance - Comments: seated EOB   Standing balance support: During functional activity;Bilateral upper extremity supported Standing balance-Leahy Scale: Poor Standing balance comment: reliant on UE support         Cognition Arousal/Alertness: Awake/alert Behavior During Therapy: Flat affect Overall Cognitive Status: Within Functional Limits for tasks assessed             Exercises      General Comments General comments (skin integrity, edema, etc.): Pt desats to 87% on 2L O2 with standing marching, able to recover to 90% then desats to 85% on 2L O2 with transfer to recliner; pt recovers to 90% with pursed lip breathing cues and ~3 minutes of seated rest- RN aware      Pertinent Vitals/Pain Pain Assessment: Faces Faces Pain Scale: Hurts little more Pain Location: back Pain Descriptors / Indicators:  (  chronic) Pain Intervention(s): Limited activity within patient's tolerance;Monitored during session;Repositioned    Home Living                      Prior Function            PT Goals (current goals can now be found in the care plan section) Acute Rehab PT Goals Patient Stated Goal: to go home PT Goal Formulation: With  patient Time For Goal Achievement: 08/13/20 Potential to Achieve Goals: Good Progress towards PT goals: Progressing toward goals    Frequency    Min 3X/week      PT Plan Current plan remains appropriate    Co-evaluation PT/OT/SLP Co-Evaluation/Treatment: Yes            AM-PAC PT "6 Clicks" Mobility   Outcome Measure  Help needed turning from your back to your side while in a flat bed without using bedrails?: A Little Help needed moving from lying on your back to sitting on the side of a flat bed without using bedrails?: A Little Help needed moving to and from a bed to a chair (including a wheelchair)?: A Little Help needed standing up from a chair using your arms (e.g., wheelchair or bedside chair)?: A Little Help needed to walk in hospital room?: A Lot Help needed climbing 3-5 steps with a railing? : A Lot 6 Click Score: 16    End of Session Equipment Utilized During Treatment: Gait belt;Oxygen Activity Tolerance: Patient limited by fatigue Patient left: in chair;with call bell/phone within reach;with chair alarm set;with nursing/sitter in room Nurse Communication: Mobility status PT Visit Diagnosis: Other abnormalities of gait and mobility (R26.89)     Time: [id number] PT Time Calculation (min) (ACUTE ONLY): 22 min  Charges:  $Therapeutic Activity: 8-22 mins                      Tori Crystle Carelli PT, DPT 08/04/20, 3:12 PM

## 2020-08-04 NOTE — Care Management Important Message (Signed)
Important Message  Patient Details IM Letter given to the Patient. Name: [name] MRN: [id number] Date of Birth: March 13, 1945   Medicare Important Message Given:  Yes     Kerin Salen 08/04/2020, 11:59 AM

## 2020-08-04 NOTE — Progress Notes (Signed)
I received a consult that pt wished to create or update an advance directive.  I spoke with patient and she stated that she already had a healthcare POA and that it was her brother.    [place], Bcc Pager, [phone] 2:20 PM

## 2020-08-05 DIAGNOSIS — J9621 Acute and chronic respiratory failure with hypoxia: Secondary | ICD-10-CM | POA: Diagnosis not present

## 2020-08-05 DIAGNOSIS — J90 Pleural effusion, not elsewhere classified: Secondary | ICD-10-CM | POA: Diagnosis not present

## 2020-08-05 DIAGNOSIS — J441 Chronic obstructive pulmonary disease with (acute) exacerbation: Secondary | ICD-10-CM | POA: Diagnosis not present

## 2020-08-05 DIAGNOSIS — I5033 Acute on chronic diastolic (congestive) heart failure: Secondary | ICD-10-CM | POA: Diagnosis not present

## 2020-08-05 LAB — RENAL FUNCTION PANEL
Albumin: 3.2 g/dL — ABNORMAL LOW (ref 3.5–5.0)
Anion gap: 9 (ref 5–15)
BUN: 42 mg/dL — ABNORMAL HIGH (ref 8–23)
CO2: 42 mmol/L — ABNORMAL HIGH (ref 22–32)
Calcium: 8.8 mg/dL — ABNORMAL LOW (ref 8.9–10.3)
Chloride: 80 mmol/L — ABNORMAL LOW (ref 98–111)
Creatinine, Ser: 0.63 mg/dL (ref 0.44–1.00)
GFR, Estimated: >60 mL/min (ref >60)
Glucose, Bld: 98 mg/dL (ref 70–99)
Phosphorus: 3.2 mg/dL (ref 2.5–4.6)
Potassium: 3.5 mmol/L (ref 3.5–5.1)
Sodium: 131 mmol/L — ABNORMAL LOW (ref 135–145)

## 2020-08-05 LAB — CBC
HCT: 35.9 % — ABNORMAL LOW (ref 36.0–46.0)
Hemoglobin: 12 g/dL (ref 12.0–15.0)
MCH: 33.1 pg (ref 26.0–34.0)
MCHC: 33.4 g/dL (ref 30.0–36.0)
MCV: 98.9 fL (ref 80.0–100.0)
Platelets: 170 10*3/uL (ref 150–400)
RBC: 3.63 MIL/uL — ABNORMAL LOW (ref 3.87–5.11)
RDW: 12.6 % (ref 11.5–15.5)
WBC: 10.5 10*3/uL (ref 4.0–10.5)
nRBC: 0 % (ref 0.0–0.2)

## 2020-08-05 LAB — GLUCOSE, CAPILLARY
Glucose-Capillary: 70 mg/dL (ref 70–99)
Glucose-Capillary: 135 mg/dL — ABNORMAL HIGH (ref 70–99)
Glucose-Capillary: 119 mg/dL — ABNORMAL HIGH (ref 70–99)
Glucose-Capillary: 411 mg/dL — ABNORMAL HIGH (ref 70–99)
Glucose-Capillary: 288 mg/dL — ABNORMAL HIGH (ref 70–99)

## 2020-08-05 LAB — MAGNESIUM: Magnesium: 1.9 mg/dL (ref 1.7–2.4)

## 2020-08-05 MED ORDER — DILTIAZEM HCL ER COATED BEADS 180 MG PO CP24: 180.0000 mg | ORAL_CAPSULE | Freq: Every day | ORAL | 1 refills | Status: AC

## 2020-08-05 MED ORDER — LOSARTAN POTASSIUM 50 MG PO TABS: 50.0000 mg | ORAL_TABLET | Freq: Every day | ORAL | 1 refills | Status: AC

## 2020-08-05 MED ORDER — FUROSEMIDE 40 MG PO TABS: 40.0000 mg | ORAL_TABLET | Freq: Every day | ORAL | 1 refills | Status: AC

## 2020-08-05 NOTE — Progress Notes (Signed)
PROGRESS NOTE  [name] [id number] DOB: [birth year]   PCP: Lujean Amel, MD  Patient is from: Home  DOA: 07/29/2020 LOS: 6  Chief complaints:  Chief Complaint  Patient presents with   Fatigue     Brief Narrative / Interim history: 75 year old F with PMH of COPD/chronic hypoxic RF on 4 L, PAF not on AC, diastolic CHF, DM-2, HTN, EtOH and tobacco use disorder presenting to ED with lethargy, shortness of breath and confusion, and admitted for acute on chronic RF due to COPD exacerbation, A. fib with RVR and a small to moderate right pleural effusion.  Patient was managed with BiPAP, systemic steroid, antibiotics and nebulizers with improvement in her symptoms.  PCCM and palliative medicine following.  Therapy recommended SNF but patient prefers to go home with home health.  Waiting on home trilogy machine.   Subjective: Seen and examined earlier this morning.  No major events overnight of this morning.  No complaints.  She denies chest pain, dyspnea, GI or UTI symptoms.  She did not wear BiPAP last night.  Objective: Vitals:   08/04/20 1929 08/05/20 0531 08/05/20 0757 08/05/20 1304  BP: (!) 146/61 (!) 177/43  (!) 153/59  Pulse: 90 72  88  Resp:  17  16  Temp: 98.9 F (37.2 C) 97.7 F (36.5 C)  98 F (36.7 C)  TempSrc: Oral Oral  Oral  SpO2: 92% 95% 96% 93%  Weight:      Height:        Intake/Output Summary (Last 24 hours) at 08/05/2020 1908 Last data filed at 08/05/2020 1154 Gross per 24 hour  Intake 720 ml  Output --  Net 720 ml   Filed Weights   07/29/20 1720 07/30/20 0201  Weight: 87 kg 90.3 kg    Examination:  GENERAL: No apparent distress.  Nontoxic. HEENT: MMM.  Vision and hearing grossly intact.  NECK: Supple.  No apparent JVD.  RESP: 96% on 3 L.  No IWOB.  Fair aeration bilaterally. CVS:  RRR. Heart sounds normal.  ABD/GI/GU: BS+. Abd soft, NTND.  MSK/EXT:  Moves extremities. No apparent deformity.  Pedal edema bilaterally. SKIN: no apparent  skin lesion or wound NEURO: Awake and alert. Oriented appropriately.  No apparent focal neuro deficit. PSYCH: Calm. Normal affect.   Procedures:  None  Microbiology summarized: [id number] and influenza PCR nonreactive. Full RVP panel negative. Right pleural fluid culture NGTD. AFB and fungal culture pending.  Assessment & Plan: Acute on chronic respiratory failure with hypoxia and hypercapnia, POA: On 4 L at baseline.  Improved. COPD exacerbation-improving. Moderate right, small left pleural effusion Bibasilar atelectasis/consolidation/diffuse bilateral bronchial wall thickening -PCCM following. -Right thoracocentesis with removal of 825 cc exudative and culture negative fluid -Received IV cefepime 7/27-7/28. -Continue BiPAP nightly.  Minimum oxygen to keep saturation above 88% while awake.  -P.o. prednisone, Trelegy Ellipta with as needed Xopenex and ipratropium. -Encourage incentive telemetry/OOB/PT/OT -PCCM recommends trilogy vent.  TOC assisting with acquisition -Palliative medicine following.   Paroxysmal atrial fibrillation with RVR: Rate controlled.  On Toprol-XL at home.  Not on AC. -Discontinued Toprol XL and started Cardizem CD 180 mg daily on 7/30 -P.o. Cardizem 30 mg every 6 hours as needed  Acute on chronic diastolic CHF/PAH: TTE LVEF of 60 to 65%, G1 DD and RVSP of 49.  BNP 360>> 106.  Imaging with small left and moderate right pleural effusion.  Cr stable.  Still with pedal edema. -Continue IV Lasix 40 mg daily -Monitor fluid status, renal functions  and electrolytes -Replenish electrolytes as appropriate  Pretracheal/hilar lymph nodes-noted on high-resolution CT on 7/27. -Needs follow-up imaging   Essential hypertension: SBP ranges from 140s to 170s.  DBP 40s to 60s. -Continue home losartan -Cardizem and diuretics as above  Controlled DM-2 with hyperglycemia/hyperlipidemia: A1c 5.8% on 7/27.  On metformin and Jardiance at home.  Hyperglycemia likely due to  steroid Recent Labs  Lab 08/04/20 1709 08/04/20 1930 08/05/20 0719 08/05/20 1145 08/05/20 1602  GLUCAP 339* 283* 70 135* 119*  -Continue SSI-moderate -Continue NovoLog 4 units 3 times daily with meals -Continue Lantus 10 units twice daily -Continue home Lipitor  Hyponatremia: Likely hypervolemic.  131.  Improving with diuretics.Marland Kitchen -IV Lasix 40 mg daily -Decreased losartan to 50 mg daily  Depression: Stable -Continue paroxetine 20 mg p.o. daily   EtOH abuse disorder -CIWAA protocol -Folic acid, multivitamin, thiamine   Tobacco use disorder -Encourage tobacco cessation -Continue nicotine patch   Intertrigo -Diflucan 150 mg p.o. weekly x 4 weeks, topical nystatin 3 times daily   Hypokalemia/hypomagnesemia/hypophosphatemia: Resolved.   Acute urinary retention/bladder over distention-resolved. -Closely monitor urine output   Weakness/deconditioning/debility: Therapy recommended SNF but she prefers to go home. -Continue therapy while inpatient   Goals of care: Recurrent hospitalization with medical noncompliance and continued EtOH and tobacco abuse at home.  Still full code -Palliative care following.   Increased nutrient needs Body mass index is 30.27 kg/m. Nutrition Problem: Increased nutrient needs Etiology: acute illness Signs/Symptoms: estimated needs Interventions: MVI, Ensure Enlive (each supplement provides 350kcal and 20 grams of protein), Prostat  DVT prophylaxis:  enoxaparin (LOVENOX) injection 40 mg Start: 07/30/20 1000  Code Status: Full code Family Communication: Patient and/or RN.  None at bedside. Level of care: Progressive Status is: Inpatient  Remains inpatient appropriate because:IV treatments appropriate due to intensity of illness or inability to take PO and Inpatient level of care appropriate due to severity of illness  Dispo:  Patient From: Home  Planned Disposition: Likely home once home trilogy set up  Medically stable for discharge:  No         Consultants:  Pulmonology Palliative medicine   Sch Meds:  Scheduled Meds:  (feeding supplement) PROSource Plus  30 mL Oral BID BM   chlorhexidine  15 mL Mouth Rinse BID   diltiazem  180 mg Oral Daily   enoxaparin (LOVENOX) injection  40 mg Subcutaneous Q24H   feeding supplement  237 mL Oral Q24H   [START ON 08/07/2020] fluconazole  150 mg Oral Weekly   fluticasone furoate-vilanterol  1 puff Inhalation Daily   folic acid  1 mg Oral Daily   furosemide  40 mg Intravenous Daily   insulin aspart  0-15 Units Subcutaneous TID WC   insulin aspart  0-5 Units Subcutaneous QHS   insulin aspart  4 Units Subcutaneous TID WC   insulin glargine-yfgn  10 Units Subcutaneous BID   losartan  50 mg Oral Daily   mouth rinse  15 mL Mouth Rinse q12n4p   multivitamin with minerals  1 tablet Oral Daily   nystatin   Topical TID   PARoxetine  20 mg Oral Daily   predniSONE  40 mg Oral Q breakfast   thiamine  100 mg Oral Daily   Or   thiamine  100 mg Intravenous Daily   umeclidinium bromide  1 puff Inhalation Daily   Continuous Infusions: PRN Meds:.diltiazem, levalbuterol, melatonin  Antimicrobials: Anti-infectives (From admission, onward)    Start     Dose/Rate Route Frequency Ordered Stop  08/07/20 1000  fluconazole (DIFLUCAN) tablet 150 mg        150 mg Oral Weekly 07/31/20 1415 08/28/20 0959   07/30/20 1000  fluconazole (DIFLUCAN) tablet 100 mg  Status:  Discontinued        100 mg Oral Daily 07/30/20 0810 07/31/20 1415   07/30/20 0600  ceFEPIme (MAXIPIME) 2 g in sodium chloride 0.9 % 100 mL IVPB  Status:  Discontinued        2 g 200 mL/hr over 30 Minutes Intravenous Every 8 hours 07/30/20 0309 07/31/20 0959        I have personally reviewed the following labs and images: CBC: Recent Labs  Lab 07/29/20 1947 07/30/20 0653 08/01/20 0242 08/02/20 0220 08/03/20 0223 08/04/20 0416 08/05/20 0426  WBC 7.0   < > 4.8 8.0 8.2 11.8* 10.5  NEUTROABS 5.6  --   --   --   --    --   --   HGB 12.3   < > 10.4* 11.4* 12.2 12.8 12.0  HCT 39.7   < > 31.8* 33.8* 36.3 38.5 35.9*  MCV 107.3*   < > 99.7 100.0 99.7 100.8* 98.9  PLT 94*   < > 120* 138* 142* 184 170   < > = values in this interval not displayed.   BMP &GFR Recent Labs  Lab 08/01/20 0242 08/02/20 0216 08/02/20 0220 08/03/20 0223 08/04/20 0416 08/05/20 0426  NA 128*  --  128* 126* 128* 131*  K 3.7  --  3.1* 3.3* 3.9 3.5  CL 80*  --  75* 74* 79* 80*  CO2 38*  --  41* 41* 39* 42*  GLUCOSE 145*  --  135* 132* 102* 98  BUN 29*  --  45* 39* 42* 42*  CREATININE 0.66  --  0.77 0.72 0.62 0.63  CALCIUM 8.4*  --  8.6* 8.8* 8.7* 8.8*  MG 2.0 1.9  --  1.9 2.3 1.9  PHOS 4.2  --  2.9 2.9  3.0 3.0 3.2   Estimated Creatinine Clearance: 72.6 mL/min (by C-G formula based on SCr of 0.63 mg/dL). Liver & Pancreas: Recent Labs  Lab 07/29/20 1947 07/31/20 0245 08/01/20 0242 08/02/20 0220 08/03/20 0223 08/04/20 0416 08/05/20 0426  AST '21 16 18 22  '$ --   --   --   ALT '24 20 23 29  '$ --   --   --   ALKPHOS 58 53 48 59  --   --   --   BILITOT 0.6 0.7 0.7 0.6  --   --   --   PROT 6.0* 5.5* 5.3* 5.7*  --   --   --   ALBUMIN 3.4* 3.1* 2.9* 3.2* 3.3* 3.2* 3.2*   No results for input(s): LIPASE, AMYLASE in the last 168 hours. No results for input(s): AMMONIA in the last 168 hours. Diabetic: No results for input(s): HGBA1C in the last 72 hours. Recent Labs  Lab 08/04/20 1709 08/04/20 1930 08/05/20 0719 08/05/20 1145 08/05/20 1602  GLUCAP 339* 283* 70 135* 119*   Cardiac Enzymes: No results for input(s): CKTOTAL, CKMB, CKMBINDEX, TROPONINI in the last 168 hours. No results for input(s): PROBNP in the last 8760 hours. Coagulation Profile: No results for input(s): INR, PROTIME in the last 168 hours. Thyroid Function Tests: No results for input(s): TSH, T4TOTAL, FREET4, T3FREE, THYROIDAB in the last 72 hours. Lipid Profile: No results for input(s): CHOL, HDL, LDLCALC, TRIG, CHOLHDL, LDLDIRECT in the last 72  hours. Anemia Panel: No results  for input(s): VITAMINB12, FOLATE, FERRITIN, TIBC, IRON, RETICCTPCT in the last 72 hours. Urine analysis:    Component Value Date/Time   COLORURINE YELLOW 07/30/2020 0807   APPEARANCEUR CLEAR 07/30/2020 0807   LABSPEC 1.016 07/30/2020 0807   PHURINE 5.0 07/30/2020 0807   GLUCOSEU 50 (A) 07/30/2020 0807   HGBUR NEGATIVE 07/30/2020 0807   BILIRUBINUR NEGATIVE 07/30/2020 0807   KETONESUR NEGATIVE 07/30/2020 0807   PROTEINUR 30 (A) 07/30/2020 0807   NITRITE NEGATIVE 07/30/2020 0807   LEUKOCYTESUR NEGATIVE 07/30/2020 0807   Sepsis Labs: Invalid input(s): PROCALCITONIN, Central  Microbiology: Recent Results (from the past 240 hour(s))  Resp Panel by RT-PCR (Flu A&B, Covid) Nasopharyngeal Swab     Status: None   Collection Time: 07/29/20  8:23 PM   Specimen: Nasopharyngeal Swab; Nasopharyngeal(NP) swabs in vial transport medium  Result Value Ref Range Status   SARS Coronavirus 2 by RT PCR NEGATIVE NEGATIVE Final    Comment: (NOTE) SARS-CoV-2 target nucleic acids are NOT DETECTED.  The SARS-CoV-2 RNA is generally detectable in upper respiratory specimens during the acute phase of infection. The lowest concentration of SARS-CoV-2 viral copies this assay can detect is 138 copies/mL. A negative result does not preclude SARS-Cov-2 infection and should not be used as the sole basis for treatment or other patient management decisions. A negative result may occur with  improper specimen collection/handling, submission of specimen other than nasopharyngeal swab, presence of viral mutation(s) within the areas targeted by this assay, and inadequate number of viral copies(<138 copies/mL). A negative result must be combined with clinical observations, patient history, and epidemiological information. The expected result is Negative.  Fact Sheet for Patients:  EntrepreneurPulse.com.au  Fact Sheet for Healthcare Providers:   IncredibleEmployment.be  This test is no t yet approved or cleared by the [place] FDA and  has been authorized for detection and/or diagnosis of SARS-CoV-2 by FDA under an Emergency Use Authorization (EUA). This EUA will remain  in effect (meaning this test can be used) for the duration of the COVID-19 declaration under Section 564(b)(1) of the Act, 21 U.S.C.section 360bbb-3(b)(1), unless the authorization is terminated  or revoked sooner.       Influenza A by PCR NEGATIVE NEGATIVE Final   Influenza B by PCR NEGATIVE NEGATIVE Final    Comment: (NOTE) The Xpert Xpress SARS-CoV-2/FLU/RSV plus assay is intended as an aid in the diagnosis of influenza from Nasopharyngeal swab specimens and should not be used as a sole basis for treatment. Nasal washings and aspirates are unacceptable for Xpert Xpress SARS-CoV-2/FLU/RSV testing.  Fact Sheet for Patients: EntrepreneurPulse.com.au  Fact Sheet for Healthcare Providers: IncredibleEmployment.be  This test is not yet approved or cleared by the [place] FDA and has been authorized for detection and/or diagnosis of SARS-CoV-2 by FDA under an Emergency Use Authorization (EUA). This EUA will remain in effect (meaning this test can be used) for the duration of the COVID-19 declaration under Section 564(b)(1) of the Act, 21 U.S.C. section 360bbb-3(b)(1), unless the authorization is terminated or revoked.  Performed at [facility], [place] [address] [place], [place] [address]   Respiratory (~20 pathogens) panel by PCR     Status: None   Collection Time: 07/30/20 10:58 AM   Specimen: Nasopharyngeal Swab; Respiratory  Result Value Ref Range Status   Adenovirus NOT DETECTED NOT DETECTED Final   Coronavirus 229E NOT DETECTED NOT DETECTED Final    Comment: (NOTE) The Coronavirus on the Respiratory Panel, DOES NOT test for the novel  Coronavirus (  2019 nCoV)     Coronavirus HKU1 NOT DETECTED NOT DETECTED Final   Coronavirus NL63 NOT DETECTED NOT DETECTED Final   Coronavirus OC43 NOT DETECTED NOT DETECTED Final   Metapneumovirus NOT DETECTED NOT DETECTED Final   Rhinovirus / Enterovirus NOT DETECTED NOT DETECTED Final   Influenza A NOT DETECTED NOT DETECTED Final   Influenza B NOT DETECTED NOT DETECTED Final   Parainfluenza Virus 1 NOT DETECTED NOT DETECTED Final   Parainfluenza Virus 2 NOT DETECTED NOT DETECTED Final   Parainfluenza Virus 3 NOT DETECTED NOT DETECTED Final   Parainfluenza Virus 4 NOT DETECTED NOT DETECTED Final   Respiratory Syncytial Virus NOT DETECTED NOT DETECTED Final   Bordetella pertussis NOT DETECTED NOT DETECTED Final   Bordetella Parapertussis NOT DETECTED NOT DETECTED Final   Chlamydophila pneumoniae NOT DETECTED NOT DETECTED Final   Mycoplasma pneumoniae NOT DETECTED NOT DETECTED Final    Comment: Performed at [facility], [place] [address], [place], [place] [address]  Body fluid culture w Gram Stain     Status: None   Collection Time: 07/31/20  1:50 PM   Specimen: Pleura; Body Fluid  Result Value Ref Range Status   Specimen Description   Final    PLEURAL RIGHT Performed at [place] [address], [place], [place] [address]    Special Requests   Final    Normal Performed at [facility], [place] [address], [place], [place] [address]    Gram Stain   Final    RARE WBC PRESENT, PREDOMINANTLY MONONUCLEAR NO ORGANISMS SEEN    Culture   Final    NO GROWTH 3 DAYS Performed at [facility], [place] [address] [place], [place] [address]    Report Status 08/04/2020 FINAL  Final  Fungus Culture With Stain     Status: None (Preliminary result)   Collection Time: 07/31/20  1:50 PM   Specimen: PATH Cytology Pleural fluid  Result Value Ref Range Status   Fungus Stain Final report  Final    Comment: (NOTE) Performed At: [facility] [place], [place]  [id number] Rush Farmer MD [id number]    Fungus (Mycology) Culture PENDING  Incomplete   Fungal Source PLEURAL  Final    Comment: RIGHT Performed at [facility], [place] [address], [place], [place] [address]   Acid Fast Smear (AFB)     Status: None   Collection Time: 07/31/20  1:50 PM   Specimen: A: PATH Cytology Pleural fluid   B: PATH Cytology Pleural fluid  Result Value Ref Range Status   AFB Specimen Processing Concentration  Final   Acid Fast Smear Negative  Final    Comment: (NOTE) Performed At: [facility] [place], [place] [id number] Rush Farmer MD [id number]    Source (AFB) PLEURAL  Final    Comment: RIGHT Performed at [place] [address], [place], [place] [address]   Fungus Culture Result     Status: None   Collection Time: 07/31/20  1:50 PM  Result Value Ref Range Status   Result 1 Comment  Final    Comment: (NOTE) KOH/Calcofluor preparation:  no fungus observed. Performed At: [facility] [place], [place] [id number] Rush Farmer MD [id number]     Radiology Studies: No results found.    Celestine Prim T. Chester  If 7PM-7AM, please contact night-coverage [email or web address] 08/05/2020, 7:08 PM

## 2020-08-05 NOTE — Plan of Care (Signed)
  Problem: Education: Goal: Knowledge of General Education information will improve Description: Including pain rating scale, medication(s)/side effects and non-pharmacologic comfort measures Outcome: Completed/Met   Problem: Health Behavior/Discharge Planning: Goal: Ability to manage health-related needs will improve Outcome: Completed/Met   Problem: Clinical Measurements: Goal: Ability to maintain clinical measurements within normal limits will improve Outcome: Completed/Met   Problem: Health Behavior/Discharge Planning: Goal: Ability to manage health-related needs will improve Outcome: Completed/Met

## 2020-08-05 NOTE — Progress Notes (Signed)
Inpatient Diabetes Program Recommendations  AACE/ADA: New Consensus Statement on Inpatient Glycemic Control (2015)  Target Ranges:  Prepandial:   less than 140 mg/dL      Peak postprandial:   less than 180 mg/dL (1-2 hours)      Critically ill patients:  140 - 180 mg/dL   Lab Results  Component Value Date   GLUCAP 70 08/05/2020   HGBA1C 5.8 (H) 07/30/2020    Review of Glycemic Control Results for ROSALVA, NEWITT (MRN [id number]) as of 08/05/2020 10:18  Ref. Range 08/04/2020 07:22 08/04/2020 11:36 08/04/2020 17:09 08/04/2020 19:30 08/05/2020 07:19  Glucose-Capillary Latest Ref Range: 70 - 99 mg/dL 86  Novolog 4 units 112 (H)  Novolog 4 units 339 (H)  Novolog 15 units 283 (H)  Novolog 3 units 70   Diabetes history: DM 2 Outpatient Diabetes medications:  Current orders for Inpatient glycemic control:  Semglee 10 units bid Novolog 0-15 units tid + hs Novolog 4 units tid meal coverage  Ensure Enlive q24 hours PO prednisone 40 mg Daily  Inpatient Diabetes Program Recommendations:    Note pt transitioned to PO prednisone from Solumedrol  -   Reduce Semglee to 5 units bid (fasting glucose trending low)  Note pt does not meet parameters for meal coverage this am (glucose >80)  Thanks,  Tama Headings RN, MSN, BC-ADM Inpatient Diabetes Coordinator Team Pager [phone] (8a-5p)

## 2020-08-05 NOTE — Progress Notes (Signed)
Nutrition Follow-up  DOCUMENTATION CODES:   Not applicable  INTERVENTION:  - continue Ensure Enlive once/day and 30 ml Prosource Plus BID.    NUTRITION DIAGNOSIS:   Increased nutrient needs related to acute illness as evidenced by estimated needs. -ongoing  GOAL:   Patient will meet greater than or equal to 90% of their needs -met on average   MONITOR:   PO intake, Supplement acceptance, Labs, Weight trends   ASSESSMENT:   75 y.o. female with medical history of HTN, type 2 DM, CHF, PAF not on AC, COPD on 4L home O2. She drinks alcohol daily and continues to smoke. She was admitted 7/19-7/25 due to acute on chronic respiratory failure/COPD exacerbation and RLL CAP. She returned to the ED on 7/26 via EMS due to lethargy and respiratory distress.  She has been eating 75-100% at all meals since 7/30. She has been accepting Ensure and Prosource 100% of the time offered.   She has not been weighed since 7/27. Mild pitting edema to BUE and moderate pitting edema to BLE documented in the edema section of flow sheet.   Discharge summary not yet entered, but discharge order for discharge to home was entered a short time ago.     Labs reviewed; CBGs: 70 and 135 mg/dl, Na: 131 mmol/l, Cl: 80 mmol/l, BUN: 42 mg/dl. Medications reviewed; 1 mg folvite/day, 40 mg IV lasix/day, sliding scale novolog, 4 units novolog TID, 10 units semglee BID, 1 tablet multivitamin with minerals/day, 40 mg deltasone/day, 100 mg thiamine/day.   Diet Order:   Diet Order             Diet heart healthy/carb modified Room service appropriate? No; Fluid consistency: Thin  Diet effective now                   EDUCATION NEEDS:   No education needs have been identified at this time  Skin:  Skin Assessment: Skin Integrity Issues: Skin Integrity Issues:: Other (Comment) Other: MASD bilateral perineum (newly documented 7/30)  Last BM:  8/1 (type 4 x2)  Height:   Ht Readings from Last 1 Encounters:   07/30/20 5' 8"  (1.727 m)    Weight:   Wt Readings from Last 1 Encounters:  07/30/20 90.3 kg      Estimated Nutritional Needs:  Kcal:  1850-2075 kcal Protein:  95-110 grams Fluid:  >/= 1.8 L/day      Jarome Matin, MS, RD, LDN, CNSC Inpatient Clinical Dietitian RD pager # available in [place]  After hours/weekend pager # available in [facility]

## 2020-08-05 NOTE — TOC Progression Note (Signed)
Transition of Care [facility]) - Progression Note    Patient Details  Name: ZENI MIGUES MRN: [id number] Date of Birth: 05-Dec-1945  Transition of Care [facility]) CM/SW Contact  Risa Auman, Juliann Pulse, RN Phone Number: 08/05/2020, 11:24 AM  Clinical Narrative:Per [employer] rep Zach-approved for Trilogy-they will talk to patient/brother [name] about cost-await outcome. [facility]-HHPT rep Levada Dy  following.     Expected Discharge Plan: [place] Barriers to Discharge: Continued Medical Work up  Expected Discharge Plan and Services Expected Discharge Plan: [place]   Discharge Planning Services: CM Consult Post Acute Care Choice: Resumption of Svcs/PTA Provider Living arrangements for the past 2 months: Single Family Home                           HH Arranged: PT   Date HH Agency Contacted: 08/04/20 Time [place]: [id number] Representative spoke with at [place]: [place] ([place]) Interventions    Readmission Risk Interventions Readmission Risk Prevention Plan 08/04/2020  PCP or Specialist Appt within 3-5 Days Complete  HRI or [place] Complete  Social Work Consult for [place] Planning/Counseling Complete  Palliative Care Screening Complete  Medication Review Press photographer) Complete  Some recent data might be hidden

## 2020-08-05 NOTE — Consult Note (Addendum)
   [facility] CM Inpatient Consult   08/05/2020  [name] 1945-03-19 [id number]  *Review by coverage partner for [name], [name]  Primary Care Provider:  Lujean Amel, MD   .Patient is currently active with [facility] for chronic disease management services.  Patient has been assigned to a [place].    Pre hospital plan for [facility] follow up was for patient to receive a call to be evaluated for assessments and disease process education.  Review of inpatient TOC notes patient wants home with [facility] was recommended for a skilled nursing level of care by therapy. Patient has been referred for home palliative follow up as well.  Plan: Will follow for progress and notify Inpatient Transition Of Care [TOC] team member to make aware that [facility] following. Continue to follow for needs.  Addendum 1100 am:  Reached out to inpatient [facility] RNCM to get room phone number and called patient at the bedside, HIPAA verified. Patient verified it will be good to speak with her brother as well. His name is [name] [phone] as well.  Will update information to [facility] RN CM.  Of note, [facility] Care Management services does not replace or interfere with any services that are needed or arranged by inpatient [facility] care management team.  For additional questions or referrals please contact:  Tammy Brood, RN BSN [name]  [phone] business mobile phone Toll free office [phone]  Fax number: [phone] [name]

## 2020-08-05 NOTE — TOC Transition Note (Addendum)
Transition of Care [facility]) - CM/SW Discharge Note   Patient Details  Name: ASSETOU KALB MRN: [id number] Date of Birth: [birth year]  Transition of Care [facility]) CM/SW Contact:  Dessa Phi, RN Phone Number: 08/05/2020, 12:50 PM   Clinical Narrative:  [employer] rep Thedore Mins received approval for [facility]-he will manage cost, & delviery with patient. [facility]-rep Levada Dy. Has home 02-[employer]-has travel tank.Has own transport home.No further CM needs. 2:53p-trying to asst w/ brother [name] t anser phone for [facility]-left vm to contact Case Manager. [employer] rep Thedore Mins will talk to patient directly for home delivery arrangements. No further CM needs.      Barriers to Discharge: No Barriers Identified   Patient Goals and CMS Choice   CMS Medicare.gov Compare Post Acute Care list provided to:: Patient    Discharge Placement                       Discharge Plan and Services   Discharge Planning Services: CM Consult Post Acute Care Choice: Resumption of Svcs/PTA Provider            DME Agency: [facility] Date DME Agency Contacted: 08/05/20 Time DME Agency Contacted: [id number] Representative spoke with at DME Agency: Thedore Mins HH Arranged: PT   Date [place]: 08/04/20 Time [place]: 1444 Representative spoke with at [place]: [place] ([place]) Interventions     Readmission Risk Interventions Readmission Risk Prevention Plan 08/04/2020  PCP or Specialist Appt within 3-5 Days Complete  HRI or [place] Complete  Social Work Consult for [place] Planning/Counseling Complete  Palliative Care Screening Complete  Medication Review Press photographer) Complete  Some recent data might be hidden

## 2020-08-05 NOTE — Progress Notes (Signed)
CBG 411. Per pt she just ate her dinner tray and drink ensure Plus. Will recheck in 1 hour.

## 2020-08-06 ENCOUNTER — Other Ambulatory Visit: Payer: Self-pay | Admitting: *Deleted

## 2020-08-06 DIAGNOSIS — J90 Pleural effusion, not elsewhere classified: Secondary | ICD-10-CM | POA: Diagnosis not present

## 2020-08-06 DIAGNOSIS — F32A Depression, unspecified: Secondary | ICD-10-CM

## 2020-08-06 DIAGNOSIS — I10 Essential (primary) hypertension: Secondary | ICD-10-CM | POA: Diagnosis not present

## 2020-08-06 DIAGNOSIS — J9621 Acute and chronic respiratory failure with hypoxia: Secondary | ICD-10-CM | POA: Diagnosis not present

## 2020-08-06 DIAGNOSIS — J189 Pneumonia, unspecified organism: Secondary | ICD-10-CM

## 2020-08-06 DIAGNOSIS — J9610 Chronic respiratory failure, unspecified whether with hypoxia or hypercapnia: Secondary | ICD-10-CM | POA: Diagnosis not present

## 2020-08-06 DIAGNOSIS — R69 Illness, unspecified: Secondary | ICD-10-CM | POA: Diagnosis not present

## 2020-08-06 DIAGNOSIS — Z7289 Other problems related to lifestyle: Secondary | ICD-10-CM | POA: Diagnosis not present

## 2020-08-06 DIAGNOSIS — J441 Chronic obstructive pulmonary disease with (acute) exacerbation: Secondary | ICD-10-CM | POA: Diagnosis not present

## 2020-08-06 DIAGNOSIS — I480 Paroxysmal atrial fibrillation: Secondary | ICD-10-CM | POA: Diagnosis not present

## 2020-08-06 DIAGNOSIS — F419 Anxiety disorder, unspecified: Secondary | ICD-10-CM

## 2020-08-06 DIAGNOSIS — E119 Type 2 diabetes mellitus without complications: Secondary | ICD-10-CM | POA: Diagnosis not present

## 2020-08-06 DIAGNOSIS — J449 Chronic obstructive pulmonary disease, unspecified: Secondary | ICD-10-CM | POA: Diagnosis not present

## 2020-08-06 DIAGNOSIS — I5033 Acute on chronic diastolic (congestive) heart failure: Secondary | ICD-10-CM | POA: Diagnosis not present

## 2020-08-06 LAB — RENAL FUNCTION PANEL
Albumin: 3.1 g/dL — ABNORMAL LOW (ref 3.5–5.0)
Anion gap: 9 (ref 5–15)
BUN: 36 mg/dL — ABNORMAL HIGH (ref 8–23)
CO2: 39 mmol/L — ABNORMAL HIGH (ref 22–32)
Calcium: 8.7 mg/dL — ABNORMAL LOW (ref 8.9–10.3)
Chloride: 80 mmol/L — ABNORMAL LOW (ref 98–111)
Creatinine, Ser: 0.55 mg/dL (ref 0.44–1.00)
GFR, Estimated: >60 mL/min (ref >60)
Glucose, Bld: 81 mg/dL (ref 70–99)
Phosphorus: 3.4 mg/dL (ref 2.5–4.6)
Potassium: 3.4 mmol/L — ABNORMAL LOW (ref 3.5–5.1)
Sodium: 128 mmol/L — ABNORMAL LOW (ref 135–145)

## 2020-08-06 LAB — GLUCOSE, CAPILLARY
Glucose-Capillary: 110 mg/dL — ABNORMAL HIGH (ref 70–99)
Glucose-Capillary: 66 mg/dL — ABNORMAL LOW (ref 70–99)
Glucose-Capillary: 112 mg/dL — ABNORMAL HIGH (ref 70–99)

## 2020-08-06 LAB — BRAIN NATRIURETIC PEPTIDE: B Natriuretic Peptide: 48.8 pg/mL (ref 0.0–100.0)

## 2020-08-06 LAB — MAGNESIUM: Magnesium: 1.9 mg/dL (ref 1.7–2.4)

## 2020-08-06 MED ORDER — HYDRALAZINE HCL 25 MG PO TABS: 25.0000 mg | ORAL_TABLET | Freq: Three times a day (TID) | ORAL | 1 refills | Status: AC

## 2020-08-06 MED ORDER — HYDRALAZINE HCL 25 MG PO TABS
25.0000 mg | ORAL_TABLET | Freq: Three times a day (TID) | ORAL | Status: DC
  Administered 2020-08-06: 25 mg via ORAL
  Filled 2020-08-06: qty 1

## 2020-08-06 MED ORDER — POTASSIUM CHLORIDE CRYS ER 20 MEQ PO TBCR
40.0000 meq | EXTENDED_RELEASE_TABLET | ORAL | Status: DC
  Administered 2020-08-06: 40 meq via ORAL
  Filled 2020-08-06: qty 2

## 2020-08-06 NOTE — Progress Notes (Signed)
    Hypoglycemic Event  CBG: 66  Treatment: 4 oz juice/soda  Symptoms: None  Follow-up CBG: [id number] CBG Result:110  Possible Reasons for Event: Unknown  Comments/MD notified:Dr. Cyndia Skeeters made aware    Rosalba Totty, Francetta Found

## 2020-08-06 NOTE — TOC Transition Note (Signed)
Transition of Care [facility]) - CM/SW Discharge Note   Patient Details  Name: [name] MRN: [id number] Date of Birth: 11-25-1945  Transition of Care [facility]) CM/SW Contact:  Dessa Phi, RN Phone Number: 08/06/2020, 11:26 AM   Clinical Narrative:  d/c today home w/[facility] rep Angela aware. [employer] rep Thedore Mins made arrangements for [facility] for home delivery. Brother [name] to bring home 02-travel tank to hospital. No further CM needs.      Barriers to Discharge: No Barriers Identified   Patient Goals and CMS Choice   CMS Medicare.gov Compare Post Acute Care list provided to:: Patient    Discharge Placement                       Discharge Plan and Services   Discharge Planning Services: CM Consult Post Acute Care Choice: Resumption of Svcs/PTA Provider            DME Agency: [facility] Date DME Agency Contacted: 08/05/20 Time DME Agency Contacted: [id number] Representative spoke with at DME Agency: Thedore Mins HH Arranged: PT   Date [place]: 08/04/20 Time [place]: 1444 Representative spoke with at [place]: [place] ([place]) Interventions     Readmission Risk Interventions Readmission Risk Prevention Plan 08/04/2020  PCP or Specialist Appt within 3-5 Days Complete  HRI or [place] Complete  Social Work Consult for [place] Planning/Counseling Complete  Palliative Care Screening Complete  Medication Review Press photographer) Complete  Some recent data might be hidden

## 2020-08-06 NOTE — Discharge Summary (Signed)
Physician Discharge Summary  [name] [id number] DOB: 1946-01-01 DOA: 07/29/2020  PCP: Lujean Amel, MD  Admit date: 07/29/2020 Discharge date: 08/06/2020  Admitted From: Home Disposition: Home  Recommendations for Outpatient Follow-up:  Follow ups as below. Please obtain CBC/BMP/Mag/BP at follow up Please follow up on the following pending results: None  Home Health: PT/OT/RN Equipment/Devices: Home trilogy vent  Discharge Condition: Stable CODE STATUS: Full code   Follow-up [place] [place] Follow up.   Specialty: [place] Why: [facility] physical therapy Contact information: [place] [address] [phone]         [facility] Follow up.   Why: Trilogy Contact information: [address] [place] [address] [phone]         Koirala, Dibas, MD. Schedule an appointment as soon as possible for a visit in 1 week(s).   Specialty: Family Medicine Contact information: [place] [address] [phone]                  Hospital Course: 75 year old F with PMH of COPD/chronic hypoxic RF on 4 L, PAF not on AC, diastolic CHF, DM-2, HTN, EtOH and tobacco use disorder presenting to ED with lethargy, shortness of breath and confusion, and admitted for acute on chronic hypoxic and hypercapnic RF due to COPD exacerbation, A. fib with RVR and small to moderate right pleural effusion.  Patient was managed with BiPAP, systemic steroid, antibiotics and nebulizers with improvement in her symptoms.  PCCM consulted and recommended trilogy vent for home use.  Palliative medicine consulted and recommended outpatient follow-up.  Therapy recommended SNF but patient chose to go home with home health.  Patient has capacity to make medical decision.  Patient has completed treatment for acute exacerbation of COPD, and discharged on home trilogy vent,  Trelegy Ellipta and as needed nebulizers.  See individual problem list below for more on hospital course.  Discharge Diagnoses:  Acute on chronic respiratory failure with hypoxia and hypercapnia, POA: Improved. COPD exacerbation-improving. Moderate right, small left pleural effusion Bibasilar atelectasis/consolidation/diffuse bilateral bronchial wall thickening -Discharged on trilogy vent, 3 L, Trelegy Ellipta, and as needed nebulizers -On 4 L POA but able to maintain appropriate saturation on 3 L with ambulation. -Right thoracocentesis with removal of 825 cc exudative and culture negative fluid -Completed antibiotic course with cefepime  -Completed systemic steroid course in-house  -Changed beta-blocker to Cardizem for A. fib to minimize risk of bronchospasm -Encouraged to quit smoking cigarettes and drinking alcohol. -Discontinue diazepam.  She did not require while in-house.  PCCM -HH PT/OT/RN ordered.  Patient refused SNF.   Paroxysmal atrial fibrillation with RVR: Rate controlled.  On Toprol-XL at home.  Not on AC. -Discontinued Toprol XL and started Cardizem CD 180 mg daily on 7/30   Acute on chronic diastolic CHF/PAH: TTE LVEF of 60 to 65%, G1 DD and RVSP of 49.  BNP 360>> 48.  Imaging with small left and moderate right pleural effusion.  Cr stable.  Still with trace pedal edema -Discharged on p.o. Lasix 40 mg daily -Continue home Jardiance -Discontinued amlodipine -Reassess BP and fluid status at follow-up.   Pretracheal/hilar lymph nodes-noted on high-resolution CT on 7/27. -Needs outpatient follow-up imaging    Essential hypertension: SBP slightly elevated.  DBP low.  TTE did not show AVR. -Discharged on losartan, Cardizem and Lasix as above -Reassess BP at follow-up.   Controlled DM-2 with  hyperglycemia/hyperlipidemia: A1c 5.8% on 7/27.  On metformin and Jardiance at home.  Hyperglycemia likely due to steroid -Continue home Jardiance, metformin and Lipitor.    Hyponatremia: Likely hypervolemic.  Stable. -Lasix as above -Decreased losartan -Recheck renal panel at follow-up.  Hypokalemia: K3.4.  Received K-Dur 40 mill equivalent x2 prior to discharge   Depression: Stable -Continue paroxetine 20 mg p.o. daily   EtOH abuse disorder -Cessation counseling and resources provided.   Tobacco use disorder -Encouraged tobacco cessation and nicotine patch use   Intertrigo -Treated with p.o. Diflucan and topical nystatin.   Acute urinary retention/bladder over distention-resolved.   Weakness/deconditioning/debility: Therapy recommended SNF but she prefers to go home.  She has capacity to make medical decision.  She is going home to stay with her brother.  Home health ordered.   Goals of care: Recurrent hospitalization with medical noncompliance and continued EtOH and tobacco abuse at home.  Still full code.  -Palliative care following outpatient.   Class I obesity Body mass index is 30.27 kg/m. Nutrition Problem: Increased nutrient needs Etiology: acute illness Signs/Symptoms: estimated needs Interventions: MVI, Ensure Enlive (each supplement provides 350kcal and 20 grams of protein), Prostat      Discharge Exam: Vitals:   08/06/20 0618 08/06/20 0751  BP: (!) 172/51   Pulse: 68 81  Resp:  16  Temp: 98.1 F (36.7 C)   SpO2: 99% 94%    GENERAL: No apparent distress.  Nontoxic. HEENT: MMM.  Vision and hearing grossly intact.  NECK: Supple.  No apparent JVD.  RESP: 94% on 2 L at rest.  No IWOB.  Fair aeration bilaterally. CVS:  RRR. Heart sounds normal.  ABD/GI/GU: Bowel sounds present. Soft. Non tender.  MSK/EXT:  Moves extremities. No apparent deformity.  Trace pedal edema. SKIN: no apparent skin lesion or wound NEURO: Awake, alert and oriented appropriately.  No apparent focal neuro deficit. PSYCH: Calm. Normal affect.   Discharge Instructions  Discharge Instructions     (HEART FAILURE PATIENTS) Call MD:  Anytime you have  any of the following symptoms: 1) 3 pound weight gain in 24 hours or 5 pounds in 1 week 2) shortness of breath, with or without a dry hacking cough 3) swelling in the hands, feet or stomach 4) if you have to sleep on extra pillows at night in order to breathe.   Complete by: As directed    Call MD for:  difficulty breathing, headache or visual disturbances   Complete by: As directed    Call MD for:  extreme fatigue   Complete by: As directed    Call MD for:  persistant dizziness or light-headedness   Complete by: As directed    Call MD for:  severe uncontrolled pain   Complete by: As directed    Diet - low sodium heart healthy   Complete by: As directed    Diet Carb Modified   Complete by: As directed    Discharge instructions   Complete by: As directed    It has been a pleasure taking care of you!  You were hospitalized with lethargy, confusion and shortness of breath likely due to COPD exacerbation.  Your symptoms improved with BiPAP, medications and and breathing treatments.  We are discharging you on Trilogy vent to continue using at home.  We also recommend you continue your breathing treatments as prescribed.  We made some changes to your heart and blood pressure medications during this hospitalization.  Please review your new medication list and the directions  on your medications before you take them.  In addition to taking your medications, it is very important that you quit cigarettes and alcohol.  It is important that you quit smoking cigarettes.  You may use nicotine patch to help you quit smoking.  Nicotine patch is available over-the-counter.  You may also discuss other options to help you quit smoking with your primary care doctor. You can also talk to professional counselors at 1-800-QUIT-NOW 8453581788) for free smoking cessation counseling.   In addition to taking your medications as prescribed, we also recommend you avoid alcohol or over-the-counter pain medication  other than plain Tylenol, limit the amount of water/fluid you drink to less than 6 cups (1500 cc) a day,  limit your sodium (salt) intake to less than 2 g (2000 mg) a day and weigh yourself daily at the same time and keeping your weight log.     Take care,   Increase activity slowly   Complete by: As directed    No wound care   Complete by: As directed       Allergies as of 08/06/2020   No Known Allergies      Medication List     STOP taking these medications    amLODipine 10 MG tablet Commonly known as: NORVASC   diazepam 5 MG tablet Commonly known as: VALIUM   metoprolol succinate 25 MG 24 hr tablet Commonly known as: TOPROL-XL   predniSONE 20 MG tablet Commonly known as: DELTASONE       TAKE these medications    albuterol 108 (90 Base) MCG/ACT inhaler Commonly known as: VENTOLIN HFA Inhale 2 puffs into the lungs every 4 (four) hours as needed for shortness of breath.   aspirin EC 81 MG tablet Take 81 mg by mouth daily. Swallow whole.   atorvastatin 20 MG tablet Commonly known as: LIPITOR Take 20 mg by mouth every evening.   diltiazem 180 MG 24 hr capsule Commonly known as: CARDIZEM CD Take 1 capsule (180 mg total) by mouth daily.   empagliflozin 10 MG Tabs tablet Commonly known as: JARDIANCE Take 10 mg by mouth daily.   feeding supplement Liqd Take 237 mLs by mouth 2 (two) times daily between meals.   ferrous sulfate 325 (65 FE) MG tablet Take 1 tablet (325 mg total) by mouth 2 (two) times daily with a meal.   furosemide 40 MG tablet Commonly known as: Lasix Take 1 tablet (40 mg total) by mouth daily.   hydrALAZINE 25 MG tablet Commonly known as: APRESOLINE Take 1 tablet (25 mg total) by mouth every 8 (eight) hours.   ipratropium-albuterol 0.5-2.5 (3) MG/3ML Soln Commonly known as: DUONEB Take 3 mLs by nebulization every 4 (four) hours as needed.   losartan 50 MG tablet Commonly known as: COZAAR Take 1 tablet (50 mg total) by mouth  daily.   metFORMIN 500 MG tablet Commonly known as: GLUCOPHAGE Take 500 mg by mouth 2 (two) times daily with a meal.   multivitamin with minerals Tabs tablet Take 1 tablet by mouth daily.   omeprazole 20 MG capsule Commonly known as: PRILOSEC Take 20 mg by mouth daily.   PARoxetine 20 MG tablet Commonly known as: PAXIL Take 20 mg by mouth daily.   Trelegy Ellipta 200-62.5-25 MCG/INH Aepb Generic drug: Fluticasone-Umeclidin-Vilant Inhale 1 puff into the lungs daily.        Consultations: Critical care Palliative medicine  Procedures/Studies:  CT Head Wo Contrast  Result Date: 07/29/2020 CLINICAL DATA:  Mental status changes  of unknown cause, lethargy. 75 year old female. EXAM: CT HEAD WITHOUT CONTRAST TECHNIQUE: Contiguous axial images were obtained from the base of the skull through the vertex without intravenous contrast. COMPARISON:  September 22, 2019. FINDINGS: Brain: No evidence of acute infarction, hemorrhage, hydrocephalus, extra-axial collection or mass lesion/mass effect. Mild atrophy and stable cystic lesion in the posterior fossa biased towards the RIGHT behind the cerebellum. Vascular: No hyperdense vessel or unexpected calcification. Skull: Normal. Negative for fracture or focal lesion. Sinuses/Orbits: Visualized paranasal sinuses and orbits with minimal LEFT maxillary and RIGHT sphenoid sinus disease. No air-fluid level. Orbits are unremarkable. Other: None IMPRESSION: 1. No acute intracranial pathology. 2. Stable posterior fossa cystic lesion, likely a arachnoid cyst. 3. Minimal sinus disease. Electronically Signed   By: Zetta Bills M.D.   On: 07/29/2020 19:41   CT Chest High Resolution  Result Date: 07/30/2020 CLINICAL DATA:  Hypoxia, chronic respiratory failure, extreme respiratory acidosis, BiPAP EXAM: CT CHEST WITHOUT CONTRAST TECHNIQUE: Multidetector CT imaging of the chest was performed following the standard protocol without intravenous contrast. High  resolution imaging of the lungs, as well as inspiratory and expiratory imaging, was performed. COMPARISON:  10/08/2016 FINDINGS: Cardiovascular: Aortic atherosclerosis. Normal heart size. Three-vessel coronary artery calcifications. No pericardial effusion. Gross enlargement of the main pulmonary artery measuring up to 4.1 cm in caliber. Mediastinum/Nodes: Markedly enlarged pretracheal and right hilar lymph nodes, largest pretracheal node measuring 2.9 x 2.1 cm (series 2, image 63). Thyroid gland, trachea, and esophagus demonstrate no significant findings. Lungs/Pleura: Moderate right, small left pleural effusions with extensive associated atelectasis or consolidation of the bilateral lung bases. There is mild interlobular septal thickening and minimal centrilobular and paraseptal emphysema. Diffuse bilateral bronchial wall thickening. No significant air trapping on expiratory phase imaging. Upper Abdomen: No acute abnormality. Musculoskeletal: No chest wall mass or suspicious bone lesions identified. There are new, although age indeterminate wedge deformities of 11, T12, and L1, with a high-grade vertebral plana deformity of T12 (series 8, image 64). IMPRESSION: 1. Assessment for fibrotic interstitial lung disease is significantly limited by the presence of pleural effusions. Within this limitation, there is no specific evidence of fibrotic interstitial lung disease. 2. Moderate right, small left pleural effusions with extensive associated atelectasis or consolidation of the bilateral lung bases. There is mild interlobular septal thickening, most consistent with mild pulmonary edema. 3. Minimal emphysema. 4. Diffuse bilateral bronchial wall thickening, which may reflect interstitial edema and/or infectious/inflammatory bronchitis. 5. Markedly enlarged pretracheal and right hilar lymph nodes, largest pretracheal node measuring 2.9 x 2.1 cm. These are nonspecific and most likely reactive, although neoplasm is  difficult to exclude. 6. Gross enlargement of the main pulmonary artery measuring up to 4.1 cm in caliber, as can be seen in pulmonary hypertension. 7. Coronary artery disease. Aortic Atherosclerosis (ICD10-I70.0) and Emphysema (ICD10-J43.9). Electronically Signed   By: Eddie Candle M.D.   On: 07/30/2020 15:13   DG Chest Port 1 View  Result Date: 07/31/2020 CLINICAL DATA:  Status post right thoracentesis. EXAM: PORTABLE CHEST 1 VIEW COMPARISON:  07/29/2020 FINDINGS: Unchanged mild cardiomegaly. No significant pulmonary vascular congestion. Interval decrease of right pleural effusion. No pneumothorax is identified. Small left pleural effusion is unchanged. IMPRESSION: No pneumothorax status post right thoracentesis. Electronically Signed   By: Miachel Roux M.D.   On: 07/31/2020 17:00   DG Chest Port 1 View  Result Date: 07/29/2020 CLINICAL DATA:  Altered mental status. EXAM: PORTABLE CHEST 1 VIEW COMPARISON:  July 22, 2020 FINDINGS: Mild, diffusely increased lung markings are  seen. Moderate to marked severity right basilar atelectasis and/or infiltrate is seen. This is very mildly decreased in severity when compared to the prior study. Mild left basilar atelectasis and/or infiltrate is also noted. There is a small to moderate size right pleural effusion. Mild to moderate severity enlargement of the cardiac silhouette is seen. There is diffuse calcification of the thoracic aorta. The visualized skeletal structures are unremarkable. IMPRESSION: 1. Stable cardiomegaly with bibasilar atelectasis and/or infiltrate, right greater than left. 2. Small to moderate sized right pleural effusion. Electronically Signed   By: Virgina Norfolk M.D.   On: 07/29/2020 19:53   DG Chest Port 1 View  Result Date: 07/22/2020 CLINICAL DATA:  Shortness of breath EXAM: PORTABLE CHEST 1 VIEW COMPARISON:  09/29/2019 FINDINGS: Dense opacity at the right base which is considered a combination of pulmonary opacity and pleural fluid.  Borderline heart size. Aortic tortuosity. No pneumothorax. Vascular congestion IMPRESSION: 1. Dense opacity at the right base, likely both moderate pleural effusion and pulmonary opacification. 2. Vascular congestion. Electronically Signed   By: Monte Fantasia M.D.   On: 07/22/2020 10:04   ECHOCARDIOGRAM COMPLETE  Result Date: 08/01/2020    ECHOCARDIOGRAM REPORT   Patient Name:   AKIERA OUTMAN Date of Exam: 08/01/2020 Medical Rec #:  [id number]           Height:       68.0 in Accession #:    [id number]          Weight:       199.1 lb Date of Birth:  December 01, 1945           BSA:          2.040 m Patient Age:    3 years            BP:           166/140 mmHg Patient Gender: F                   HR:           68 bpm. Exam Location:  Inpatient Procedure: 2D Echo, Cardiac Doppler and Color Doppler Indications:    Acute respiratory distress R06.03  History:        Patient has prior history of Echocardiogram examinations, most                 recent 09/22/2019. CHF, COPD; Risk Factors:Diabetes, Hypertension                 and Dyslipidemia.  Sonographer:    Bernadene Person RDCS Referring Phys: [place]  1. Left ventricular ejection fraction, by estimation, is 60 to 65%. The left ventricle has normal function. The left ventricle has no regional wall motion abnormalities. Left ventricular diastolic parameters are consistent with Grade I diastolic dysfunction (impaired relaxation). Elevated left ventricular end-diastolic pressure.  2. Right ventricular systolic function is normal. The right ventricular size is normal. There is moderately elevated pulmonary artery systolic pressure.  3. Left atrial size was mildly dilated.  4. The mitral valve is normal in structure. Trivial mitral valve regurgitation. No evidence of mitral stenosis.  5. The aortic valve is tricuspid. Aortic valve regurgitation is not visualized. Mild aortic valve stenosis. Aortic valve area, by VTI measures 1.14 cm. Aortic valve  mean gradient measures 12.0 mmHg. Aortic valve Vmax measures 2.22 m/s.  6. The inferior vena cava is dilated in size with >50% respiratory variability, suggesting right atrial pressure of 8  mmHg. FINDINGS  Left Ventricle: Left ventricular ejection fraction, by estimation, is 60 to 65%. The left ventricle has normal function. The left ventricle has no regional wall motion abnormalities. The left ventricular internal cavity size was normal in size. There is  no left ventricular hypertrophy. Left ventricular diastolic parameters are consistent with Grade I diastolic dysfunction (impaired relaxation). Elevated left ventricular end-diastolic pressure. Right Ventricle: The right ventricular size is normal. No increase in right ventricular wall thickness. Right ventricular systolic function is normal. There is moderately elevated pulmonary artery systolic pressure. The tricuspid regurgitant velocity is 3.19 m/s, and with an assumed right atrial pressure of 8 mmHg, the estimated right ventricular systolic pressure is [id number] mmHg. Left Atrium: Left atrial size was mildly dilated. Right Atrium: Right atrial size was normal in size. Pericardium: There is no evidence of pericardial effusion. Mitral Valve: The mitral valve is normal in structure. Trivial mitral valve regurgitation. No evidence of mitral valve stenosis. Tricuspid Valve: The tricuspid valve is normal in structure. Tricuspid valve regurgitation is trivial. No evidence of tricuspid stenosis. Aortic Valve: The aortic valve is tricuspid. Aortic valve regurgitation is not visualized. Mild aortic stenosis is present. Aortic valve mean gradient measures 12.0 mmHg. Aortic valve peak gradient measures 19.7 mmHg. Aortic valve area, by VTI measures 1.14 cm. Pulmonic Valve: The pulmonic valve was normal in structure. Pulmonic valve regurgitation is not visualized. No evidence of pulmonic stenosis. Aorta: The aortic root is normal in size and structure. Venous: The inferior  vena cava is dilated in size with greater than 50% respiratory variability, suggesting right atrial pressure of 8 mmHg. IAS/Shunts: No atrial level shunt detected by color flow Doppler.  LEFT VENTRICLE PLAX 2D LVIDd:         4.20 cm  Diastology LVIDs:         2.70 cm  LV e' medial:    5.29 cm/s LV PW:         2.00 cm  LV E/e' medial:  23.1 LV IVS:        1.00 cm  LV e' lateral:   5.36 cm/s LVOT diam:     1.90 cm  LV E/e' lateral: 22.8 LV SV:         65 LV SV Index:   32 LVOT Area:     2.84 cm  RIGHT VENTRICLE RV S prime:     15.30 cm/s TAPSE (M-mode): 1.8 cm LEFT ATRIUM             Index       RIGHT ATRIUM           Index LA diam:        4.10 cm 2.01 cm/m  RA Area:     15.10 cm LA Vol (A2C):   60.3 ml 29.56 ml/m RA Volume:   36.30 ml  17.80 ml/m LA Vol (A4C):   65.2 ml 31.97 ml/m LA Biplane Vol: 64.1 ml 31.43 ml/m  AORTIC VALVE AV Area (Vmax):    1.24 cm AV Area (Vmean):   1.09 cm AV Area (VTI):     1.14 cm AV Vmax:           222.00 cm/s AV Vmean:          171.000 cm/s AV VTI:            0.570 m AV Peak Grad:      19.7 mmHg AV Mean Grad:      12.0 mmHg LVOT Vmax:  96.80 cm/s LVOT Vmean:        66.000 cm/s LVOT VTI:          0.229 m LVOT/AV VTI ratio: 0.40  AORTA Ao Root diam: 3.00 cm MITRAL VALVE                TRICUSPID VALVE MV Area (PHT): 2.62 cm     TR Peak grad:   40.7 mmHg MV Decel Time: 289 msec     TR Vmax:        319.00 cm/s MV E velocity: 122.00 cm/s MV A velocity: 138.00 cm/s  SHUNTS MV E/A ratio:  0.88         Systemic VTI:  0.23 m                             Systemic Diam: 1.90 cm Skeet Latch MD Electronically signed by Skeet Latch MD Signature Date/Time: 08/01/2020/4:45:29 PM    Final    US Abdomen Limited RUQ (LIVER/GB)  Result Date: 07/31/2020 CLINICAL DATA:  75 year old female with history of alcoholism and concern for cirrhosis. EXAM: ULTRASOUND ABDOMEN LIMITED RIGHT UPPER QUADRANT COMPARISON:  CT abdomen pelvis dated 08/24/2017. FINDINGS: Gallbladder: The gallbladder  is not visualized, likely contracted or surgically absent. Clinical correlation is recommended. Common bile duct: Diameter: 6 mm Liver: The liver demonstrates a slightly increased echogenicity with mildly coarsened echotexture which may represent mild fatty infiltration. Early fibrosis is not excluded. Elastography may provide better evaluation. Portal vein is patent on color Doppler imaging with normal direction of blood flow towards the liver. Other: None. IMPRESSION: Probable mild fatty liver and findings concerning for early fibrosis. Elastography may provide better evaluation if clinically indicated. Electronically Signed   By: Anner Crete M.D.   On: 07/31/2020 16:56   US THORACENTESIS ASP PLEURAL SPACE W/IMG GUIDE  Result Date: 07/31/2020 INDICATION: Shortness of breath. Right-sided pleural effusion. Request for diagnostic and therapeutic thoracentesis. EXAM: ULTRASOUND GUIDED RIGHT THORACENTESIS MEDICATIONS: 1% plain lidocaine, 7 mL COMPLICATIONS: None immediate. PROCEDURE: An ultrasound guided thoracentesis was thoroughly discussed with the patient and questions answered. The benefits, risks, alternatives and complications were also discussed. The patient understands and wishes to proceed with the procedure. Written consent was obtained. Ultrasound was performed to localize and mark an adequate pocket of fluid in the right chest. The area was then prepped and draped in the normal sterile fashion. 1% Lidocaine was used for local anesthesia. Under ultrasound guidance a 6 Fr Safe-T-Centesis catheter was introduced. Thoracentesis was performed. The catheter was removed and a dressing applied. FINDINGS: A total of approximately 825 mL of clear yellow fluid was removed. Samples were sent to the laboratory as requested by the clinical team. IMPRESSION: Successful ultrasound guided right thoracentesis yielding 825 mL of pleural fluid. Read by: Ascencion Dike PA-C Electronically Signed   By: Miachel Roux M.D.    On: 07/31/2020 15:33       The results of significant diagnostics from this hospitalization (including imaging, microbiology, ancillary and laboratory) are listed below for reference.     Microbiology: Recent Results (from the past 240 hour(s))  Resp Panel by RT-PCR (Flu A&B, Covid) Nasopharyngeal Swab     Status: None   Collection Time: 07/29/20  8:23 PM   Specimen: Nasopharyngeal Swab; Nasopharyngeal(NP) swabs in vial transport medium  Result Value Ref Range Status   SARS Coronavirus 2 by RT PCR NEGATIVE NEGATIVE Final    Comment: (NOTE) SARS-CoV-2 target nucleic  acids are NOT DETECTED.  The SARS-CoV-2 RNA is generally detectable in upper respiratory specimens during the acute phase of infection. The lowest concentration of SARS-CoV-2 viral copies this assay can detect is 138 copies/mL. A negative result does not preclude SARS-Cov-2 infection and should not be used as the sole basis for treatment or other patient management decisions. A negative result may occur with  improper specimen collection/handling, submission of specimen other than nasopharyngeal swab, presence of viral mutation(s) within the areas targeted by this assay, and inadequate number of viral copies(<138 copies/mL). A negative result must be combined with clinical observations, patient history, and epidemiological information. The expected result is Negative.  Fact Sheet for Patients:  EntrepreneurPulse.com.au  Fact Sheet for Healthcare Providers:  IncredibleEmployment.be  This test is no t yet approved or cleared by the [place] FDA and  has been authorized for detection and/or diagnosis of SARS-CoV-2 by FDA under an Emergency Use Authorization (EUA). This EUA will remain  in effect (meaning this test can be used) for the duration of the COVID-19 declaration under Section 564(b)(1) of the Act, 21 U.S.C.section 360bbb-3(b)(1), unless the authorization is  terminated  or revoked sooner.       Influenza A by PCR NEGATIVE NEGATIVE Final   Influenza B by PCR NEGATIVE NEGATIVE Final    Comment: (NOTE) The Xpert Xpress SARS-CoV-2/FLU/RSV plus assay is intended as an aid in the diagnosis of influenza from Nasopharyngeal swab specimens and should not be used as a sole basis for treatment. Nasal washings and aspirates are unacceptable for Xpert Xpress SARS-CoV-2/FLU/RSV testing.  Fact Sheet for Patients: EntrepreneurPulse.com.au  Fact Sheet for Healthcare Providers: IncredibleEmployment.be  This test is not yet approved or cleared by the [place] FDA and has been authorized for detection and/or diagnosis of SARS-CoV-2 by FDA under an Emergency Use Authorization (EUA). This EUA will remain in effect (meaning this test can be used) for the duration of the COVID-19 declaration under Section 564(b)(1) of the Act, 21 U.S.C. section 360bbb-3(b)(1), unless the authorization is terminated or revoked.  Performed at [facility], [place] [address] [place], [place] [address]   Respiratory (~20 pathogens) panel by PCR     Status: None   Collection Time: 07/30/20 10:58 AM   Specimen: Nasopharyngeal Swab; Respiratory  Result Value Ref Range Status   Adenovirus NOT DETECTED NOT DETECTED Final   Coronavirus 229E NOT DETECTED NOT DETECTED Final    Comment: (NOTE) The Coronavirus on the Respiratory Panel, DOES NOT test for the novel  Coronavirus (2019 nCoV)    Coronavirus HKU1 NOT DETECTED NOT DETECTED Final   Coronavirus NL63 NOT DETECTED NOT DETECTED Final   Coronavirus OC43 NOT DETECTED NOT DETECTED Final   Metapneumovirus NOT DETECTED NOT DETECTED Final   Rhinovirus / Enterovirus NOT DETECTED NOT DETECTED Final   Influenza A NOT DETECTED NOT DETECTED Final   Influenza B NOT DETECTED NOT DETECTED Final   Parainfluenza Virus 1 NOT DETECTED NOT DETECTED Final   Parainfluenza Virus 2 NOT  DETECTED NOT DETECTED Final   Parainfluenza Virus 3 NOT DETECTED NOT DETECTED Final   Parainfluenza Virus 4 NOT DETECTED NOT DETECTED Final   Respiratory Syncytial Virus NOT DETECTED NOT DETECTED Final   Bordetella pertussis NOT DETECTED NOT DETECTED Final   Bordetella Parapertussis NOT DETECTED NOT DETECTED Final   Chlamydophila pneumoniae NOT DETECTED NOT DETECTED Final   Mycoplasma pneumoniae NOT DETECTED NOT DETECTED Final    Comment: Performed at [facility], [place]. [place]  [address], [place], [place] [address]  Body fluid culture w Gram Stain     Status: None   Collection Time: 07/31/20  1:50 PM   Specimen: Pleura; Body Fluid  Result Value Ref Range Status   Specimen Description   Final    PLEURAL RIGHT Performed at [place] [address], [place], [place] [address]    Special Requests   Final    Normal Performed at [facility], [place] [address] [place], [place] [address]    Gram Stain   Final    RARE WBC PRESENT, PREDOMINANTLY MONONUCLEAR NO ORGANISMS SEEN    Culture   Final    NO GROWTH 3 DAYS Performed at [facility], [place] [address] [place], [place] [address]    Report Status 08/04/2020 FINAL  Final  Fungus Culture With Stain     Status: None (Preliminary result)   Collection Time: 07/31/20  1:50 PM   Specimen: PATH Cytology Pleural fluid  Result Value Ref Range Status   Fungus Stain Final report  Final    Comment: (NOTE) Performed At: [facility] [place], [place] [id number] Rush Farmer MD [id number]    Fungus (Mycology) Culture PENDING  Incomplete   Fungal Source PLEURAL  Final    Comment: RIGHT Performed at [facility], [place] [address] [place], [place] [address]   Acid Fast Smear (AFB)     Status: None   Collection Time: 07/31/20  1:50 PM   Specimen: A: PATH Cytology Pleural fluid   B: PATH Cytology Pleural fluid  Result Value Ref Range Status   AFB Specimen  Processing Concentration  Final   Acid Fast Smear Negative  Final    Comment: (NOTE) Performed At: [facility] [place], [place] [id number] Rush Farmer MD [id number]    Source (AFB) PLEURAL  Final    Comment: RIGHT Performed at [place] [address] [place], [place] [address]   Fungus Culture Result     Status: None   Collection Time: 07/31/20  1:50 PM  Result Value Ref Range Status   Result 1 Comment  Final    Comment: (NOTE) KOH/Calcofluor preparation:  no fungus observed. Performed At: [facility] [place], [place] [id number] Rush Farmer MD [id number]      Labs:  CBC: Recent Labs  Lab 08/01/20 0242 08/02/20 0220 08/03/20 0223 08/04/20 0416 08/05/20 0426  WBC 4.8 8.0 8.2 11.8* 10.5  HGB 10.4* 11.4* 12.2 12.8 12.0  HCT 31.8* 33.8* 36.3 38.5 35.9*  MCV 99.7 100.0 99.7 100.8* 98.9  PLT 120* 138* 142* 184 170   BMP &GFR Recent Labs  Lab 08/02/20 0216 08/02/20 0220 08/03/20 0223 08/04/20 0416 08/05/20 0426 08/06/20 0449  NA  --  128* 126* 128* 131* 128*  K  --  3.1* 3.3* 3.9 3.5 3.4*  CL  --  75* 74* 79* 80* 80*  CO2  --  41* 41* 39* 42* 39*  GLUCOSE  --  135* 132* 102* 98 81  BUN  --  45* 39* 42* 42* 36*  CREATININE  --  0.77 0.72 0.62 0.63 0.55  CALCIUM  --  8.6* 8.8* 8.7* 8.8* 8.7*  MG 1.9  --  1.9 2.3 1.9 1.9  PHOS  --  2.9 2.9  3.0 3.0 3.2 3.4   Estimated Creatinine Clearance: 72.6 mL/min (by C-G formula based on SCr of 0.55 mg/dL). Liver & Pancreas: Recent Labs  Lab 07/31/20 0245 08/01/20 0242 08/02/20 0220 08/03/20 0223 08/04/20 0416 08/05/20 0426 08/06/20 0449  AST '16 18 22  '$ --   --   --   --   ALT '20 23 29  '$ --   --   --   --   ALKPHOS 53 48 59  --   --   --   --   BILITOT 0.7 0.7 0.6  --   --   --   --   PROT 5.5* 5.3* 5.7*  --   --   --   --   ALBUMIN 3.1* 2.9* 3.2* 3.3* 3.2* 3.2* 3.1*   No results for input(s): LIPASE, AMYLASE in the last 168  hours. No results for input(s): AMMONIA in the last 168 hours. Diabetic: No results for input(s): HGBA1C in the last 72 hours. Recent Labs  Lab 08/05/20 1944 08/05/20 2133 08/06/20 0735 08/06/20 0817 08/06/20 1151  GLUCAP 411* 288* 66* 110* 112*   Cardiac Enzymes: No results for input(s): CKTOTAL, CKMB, CKMBINDEX, TROPONINI in the last 168 hours. No results for input(s): PROBNP in the last 8760 hours. Coagulation Profile: No results for input(s): INR, PROTIME in the last 168 hours. Thyroid Function Tests: No results for input(s): TSH, T4TOTAL, FREET4, T3FREE, THYROIDAB in the last 72 hours. Lipid Profile: No results for input(s): CHOL, HDL, LDLCALC, TRIG, CHOLHDL, LDLDIRECT in the last 72 hours. Anemia Panel: No results for input(s): VITAMINB12, FOLATE, FERRITIN, TIBC, IRON, RETICCTPCT in the last 72 hours. Urine analysis:    Component Value Date/Time   COLORURINE YELLOW 07/30/2020 0807   APPEARANCEUR CLEAR 07/30/2020 0807   LABSPEC 1.016 07/30/2020 0807   PHURINE 5.0 07/30/2020 0807   GLUCOSEU 50 (A) 07/30/2020 0807   HGBUR NEGATIVE 07/30/2020 0807   BILIRUBINUR NEGATIVE 07/30/2020 0807   KETONESUR NEGATIVE 07/30/2020 0807   PROTEINUR 30 (A) 07/30/2020 0807   NITRITE NEGATIVE 07/30/2020 0807   LEUKOCYTESUR NEGATIVE 07/30/2020 0807   Sepsis Labs: Invalid input(s): PROCALCITONIN, LACTICIDVEN   Time coordinating discharge: 45 minutes  SIGNED:  Mercy Riding, MD  [facility] 08/06/2020, 9:57 PM  If 7PM-7AM, please contact night-coverage [email or web address]

## 2020-08-06 NOTE — Patient Outreach (Signed)
[place] [facility]) Care Management  08/06/2020  [name] [birth year] [id number]   [facility] multidisciplinary care discussion template  Template to [place] for 08/14/20   [name] L. Lavina Hamman, RN, BSN, [place] Coordinator Office number [phone] [facility] number [phone] Fax number [phone]

## 2020-08-06 NOTE — Progress Notes (Signed)
Pt discharged home today per Dr. Cyndia Skeeters. Pt's IV site D/C'd and WDL. Pt's VSS. Pt provided with home medication list, discharge instructions and prescriptions. Verbalized understanding. Pt left floor via WC in stable condition accompanied by NT.

## 2020-08-06 NOTE — Progress Notes (Signed)
Occupational Therapy Treatment Patient Details Name: [name] MRN: [id number] DOB: 12-27-1945 Today's Date: 08/06/2020    History of present illness 75 yo F admitted 7/26 via ED due to increased weakness and lethargy. Has COPD (never had PFTs, been on o2 for years ), ongoing smoking, CHF, 4L baseline O2 requirement.  AT baseline lives alone with dog. Uses walker for mobility. Unable to navigate 3 steps without help. Never seen a pulmonary MD. Pt with daily etoh.  Recently admitted to [facility]  7/19-7/25 with acute on chronic hypoxia and hypercapnea.   OT comments  Patient performing ADL tasks of toilet transfer, clothing management/peri care, and oral care at sink all without any physical assistance. OT present to assist with portable O2 management, patient does not endorse any shortness of breath during session on 2L O2. Patient hopeful to D/C home today, progressing well with self care.    Follow Up Recommendations  No OT follow up    Equipment Recommendations  None recommended by OT       Precautions / Restrictions Precautions Precautions: Fall Precaution Comments: monitor O2       Mobility Bed Mobility Overal bed mobility: Modified Independent       Supine to sit: HOB elevated          Transfers Overall transfer level: Modified independent Equipment used: Rolling walker (2 wheeled)                  Balance Overall balance assessment: Modified Independent                                         ADL either performed or assessed with clinical judgement   ADL Overall ADL's : Modified independent     Grooming: Oral care;Wash/dry face;Wash/dry hands;Modified independent;Standing                 Lower Body Dressing Details (indicate cue type and reason): when provided with socks at edge of bed patient states "do I have to, I've been walking barefoot" Toilet Transfer: Modified Independent;Ambulation;RW Toilet Transfer Details  (indicate cue type and reason): no physical assistance on/off toilet, no loss of balance noted ambulating to/from bathroom with walker Toileting- Clothing Manipulation and Hygiene: Modified independent;Sit to/from stand       Functional mobility during ADLs: Modified independent;Rolling walker General ADL Comments: patient did well with self care tasks this session, no complaints of shortness of breath and is hopeful to go home today. OT present to assist with portable O2 tank      Cognition Arousal/Alertness: Awake/alert Behavior During Therapy: WFL for tasks assessed/performed Overall Cognitive Status: Within Functional Limits for tasks assessed                                                     Pertinent Vitals/ Pain       Pain Assessment: Faces Faces Pain Scale: No hurt         Frequency  Min 2X/week        Progress Toward Goals  OT Goals(current goals can now be found in the care plan section)  Progress towards OT goals: Progressing toward goals  Acute Rehab OT Goals Patient Stated Goal: to go home OT Goal Formulation:  With patient Time For Goal Achievement: 08/13/20 Potential to Achieve Goals: Good ADL Goals Pt Will Perform Lower Body Dressing: with modified independence;sit to/from stand Pt Will Transfer to Toilet: with modified independence;ambulating Pt Will Perform Toileting - Clothing Manipulation and hygiene: with modified independence;sit to/from stand Additional ADL Goal #1: Patient will perform 10 min functional activity or exercise activity as evidence of improving activity tolerance  Plan Discharge plan needs to be updated       AM-PAC OT "6 Clicks" Daily Activity     Outcome Measure   Help from another person eating meals?: None Help from another person taking care of personal grooming?: None Help from another person toileting, which includes using toliet, bedpan, or urinal?: None Help from another person bathing  (including washing, rinsing, drying)?: A Little Help from another person to put on and taking off regular upper body clothing?: None Help from another person to put on and taking off regular lower body clothing?: A Lot 6 Click Score: 21    End of Session Equipment Utilized During Treatment: Oxygen;Rolling walker  OT Visit Diagnosis: Muscle weakness (generalized) (M62.81)   Activity Tolerance Patient tolerated treatment well   Patient Left in chair;with call bell/phone within reach   Nurse Communication Mobility status        Time: [id number] OT Time Calculation (min): 16 min  Charges: OT General Charges $OT Visit: 1 Visit OT Treatments $Self Care/Home Management : 8-22 mins  Delbert Phenix OT OT pager: [place] 08/06/2020, 10:16 AM

## 2020-08-07 ENCOUNTER — Other Ambulatory Visit: Payer: Medicare HMO | Admitting: *Deleted

## 2020-08-07 NOTE — Patient Outreach (Signed)
[place] [facility]) Care Management  08/07/2020  ANNDRIA COMITO 09-26-1945 [id number]   Telephone Assessment-Unsuccessful Outreach #3  RN attempted outreach call however remains unsuccessful. RN able to leave a HIPAA approved voice message requesting a call back.  Will attempt once again another outreach call next month for pending services.  Raina Mina, RN Care Management Coordinator [place] Office [phone]

## 2020-08-13 ENCOUNTER — Inpatient Hospital Stay: Payer: Medicare HMO | Admitting: Primary Care

## 2020-08-13 NOTE — Progress Notes (Deleted)
$'@Patient'v$  ID: [name], female    DOB: 1945-05-01, 75 y.o.   MRN: [id number]  No chief complaint on file.   Referring provider: Lujean Amel, MD  HPI: 75 year old female, current everyday smoker.  Past medical history significant for COPD, chronic respiratory failure, community-acquired pneumonia, hypertension, chronic diastolic heart failure, paroxysmal A. fib, type 2 diabetes, dyslipidemia, GERD.  Former patient of Dr. Melvyn Novas, last seen in office by him in November 2012.  08/13/2020 Patient presents today for hospital follow-up.  Admitted from 07/29/2020 - 08/06/2020 for acute on chronic respiratory failure secondary to COPD exacerbation, lateral pleural effusions and community-acquired pneumonia.  She was seen by [facility] CM inpatient by Dr. Chase Caller.  Patient underwent right thoracentesis with 825 cc fluid removed.  Fluid was consistent with exudative nature, culture negative.  She completed antibiotic course with cefepime.  She completed systemic steroid course.  She was discharged on trilogy ventilator and 3 L of oxygen.  Maintained on Trelegy Ellipta.  She is on Lasix 40 mg daily.  Reassess blood pressure and fluid status    No Known Allergies  Immunization History  Administered Date(s) Administered   Influenza Split 12/02/2010   PPD Test 04/23/2016   Pneumococcal Polysaccharide-23 02/03/2018    Past Medical History:  Diagnosis Date   Acute exacerbation of CHF (congestive heart failure) ([place]) 09/22/2019   COPD (chronic obstructive pulmonary disease) (HCC)    Diabetes mellitus    Diastolic dysfunction    history   Hyperlipidemia    Hypertension     Tobacco History: Social History   Tobacco Use  Smoking Status Every Day   Packs/day: 1.00   Years: 52.00   Pack years: 52.00   Types: Cigarettes  Smokeless Tobacco Never   Ready to quit: Not Answered Counseling given: Not Answered   Outpatient Medications Prior to Visit  Medication Sig Dispense Refill    albuterol (VENTOLIN HFA) 108 (90 Base) MCG/ACT inhaler Inhale 2 puffs into the lungs every 4 (four) hours as needed for shortness of breath.     aspirin EC 81 MG tablet Take 81 mg by mouth daily. Swallow whole.     atorvastatin (LIPITOR) 20 MG tablet Take 20 mg by mouth every evening.     diltiazem (CARDIZEM CD) 180 MG 24 hr capsule Take 1 capsule (180 mg total) by mouth daily. 90 capsule 1   empagliflozin (JARDIANCE) 10 MG TABS tablet Take 10 mg by mouth daily.     feeding supplement, ENSURE ENLIVE, (ENSURE ENLIVE) LIQD Take 237 mLs by mouth 2 (two) times daily between meals. 237 mL 12   ferrous sulfate 325 (65 FE) MG tablet Take 1 tablet (325 mg total) by mouth 2 (two) times daily with a meal.  3   furosemide (LASIX) 40 MG tablet Take 1 tablet (40 mg total) by mouth daily. 90 tablet 1   hydrALAZINE (APRESOLINE) 25 MG tablet Take 1 tablet (25 mg total) by mouth every 8 (eight) hours. 90 tablet 1   ipratropium-albuterol (DUONEB) 0.5-2.5 (3) MG/3ML SOLN Take 3 mLs by nebulization every 4 (four) hours as needed. 360 mL    losartan (COZAAR) 50 MG tablet Take 1 tablet (50 mg total) by mouth daily. 90 tablet 1   metFORMIN (GLUCOPHAGE) 500 MG tablet Take 500 mg by mouth 2 (two) times daily with a meal.     Multiple Vitamin (MULTIVITAMIN WITH MINERALS) TABS tablet Take 1 tablet by mouth daily.     omeprazole (PRILOSEC) 20 MG capsule Take 20 mg  by mouth daily.     PARoxetine (PAXIL) 20 MG tablet Take 20 mg by mouth daily.      TRELEGY ELLIPTA 200-62.5-25 MCG/INH AEPB Inhale 1 puff into the lungs daily.     No facility-administered medications prior to visit.      Review of Systems  Review of Systems   Physical Exam  There were no vitals taken for this visit. Physical Exam   Lab Results:  CBC    Component Value Date/Time   WBC 10.5 08/05/2020 0426   RBC 3.63 (L) 08/05/2020 0426   HGB 12.0 08/05/2020 0426   HGB 6.8 (LL) 01/24/2018 1526   HCT 35.9 (L) 08/05/2020 0426   HCT 21.9 (L)  01/24/2018 1526   PLT 170 08/05/2020 0426   PLT 184 01/24/2018 1526   MCV 98.9 08/05/2020 0426   MCV 69 (L) 01/24/2018 1526   MCH 33.1 08/05/2020 0426   MCHC 33.4 08/05/2020 0426   RDW 12.6 08/05/2020 0426   RDW 15.6 (H) 01/24/2018 1526   LYMPHSABS 0.7 07/29/2020 1947   MONOABS 0.6 07/29/2020 1947   EOSABS 0.0 07/29/2020 1947   BASOSABS 0.0 07/29/2020 1947    BMET    Component Value Date/Time   NA 128 (L) 08/06/2020 0449   NA 130 (L) 01/24/2018 1526   K 3.4 (L) 08/06/2020 0449   CL 80 (L) 08/06/2020 0449   CO2 39 (H) 08/06/2020 0449   GLUCOSE 81 08/06/2020 0449   BUN 36 (H) 08/06/2020 0449   BUN 10 01/24/2018 1526   CREATININE 0.55 08/06/2020 0449   CREATININE 0.75 01/19/2018 1302   CALCIUM 8.7 (L) 08/06/2020 0449   GFRNONAA >60 08/06/2020 0449   GFRAA >60 09/28/2019 0712    BNP    Component Value Date/Time   BNP 48.8 08/06/2020 0449    ProBNP    Component Value Date/Time   PROBNP 36.0 11/12/2007 1330    Imaging: CT Head Wo Contrast  Result Date: 07/29/2020 CLINICAL DATA:  Mental status changes of unknown cause, lethargy. 75 year old female. EXAM: CT HEAD WITHOUT CONTRAST TECHNIQUE: Contiguous axial images were obtained from the base of the skull through the vertex without intravenous contrast. COMPARISON:  September 22, 2019. FINDINGS: Brain: No evidence of acute infarction, hemorrhage, hydrocephalus, extra-axial collection or mass lesion/mass effect. Mild atrophy and stable cystic lesion in the posterior fossa biased towards the RIGHT behind the cerebellum. Vascular: No hyperdense vessel or unexpected calcification. Skull: Normal. Negative for fracture or focal lesion. Sinuses/Orbits: Visualized paranasal sinuses and orbits with minimal LEFT maxillary and RIGHT sphenoid sinus disease. No air-fluid level. Orbits are unremarkable. Other: None IMPRESSION: 1. No acute intracranial pathology. 2. Stable posterior fossa cystic lesion, likely a arachnoid cyst. 3. Minimal  sinus disease. Electronically Signed   By: Zetta Bills M.D.   On: 07/29/2020 19:41   CT Chest High Resolution  Result Date: 07/30/2020 CLINICAL DATA:  Hypoxia, chronic respiratory failure, extreme respiratory acidosis, BiPAP EXAM: CT CHEST WITHOUT CONTRAST TECHNIQUE: Multidetector CT imaging of the chest was performed following the standard protocol without intravenous contrast. High resolution imaging of the lungs, as well as inspiratory and expiratory imaging, was performed. COMPARISON:  10/08/2016 FINDINGS: Cardiovascular: Aortic atherosclerosis. Normal heart size. Three-vessel coronary artery calcifications. No pericardial effusion. Gross enlargement of the main pulmonary artery measuring up to 4.1 cm in caliber. Mediastinum/Nodes: Markedly enlarged pretracheal and right hilar lymph nodes, largest pretracheal node measuring 2.9 x 2.1 cm (series 2, image 63). Thyroid gland, trachea, and esophagus demonstrate no significant  findings. Lungs/Pleura: Moderate right, small left pleural effusions with extensive associated atelectasis or consolidation of the bilateral lung bases. There is mild interlobular septal thickening and minimal centrilobular and paraseptal emphysema. Diffuse bilateral bronchial wall thickening. No significant air trapping on expiratory phase imaging. Upper Abdomen: No acute abnormality. Musculoskeletal: No chest wall mass or suspicious bone lesions identified. There are new, although age indeterminate wedge deformities of 11, T12, and L1, with a high-grade vertebral plana deformity of T12 (series 8, image 64). IMPRESSION: 1. Assessment for fibrotic interstitial lung disease is significantly limited by the presence of pleural effusions. Within this limitation, there is no specific evidence of fibrotic interstitial lung disease. 2. Moderate right, small left pleural effusions with extensive associated atelectasis or consolidation of the bilateral lung bases. There is mild interlobular  septal thickening, most consistent with mild pulmonary edema. 3. Minimal emphysema. 4. Diffuse bilateral bronchial wall thickening, which may reflect interstitial edema and/or infectious/inflammatory bronchitis. 5. Markedly enlarged pretracheal and right hilar lymph nodes, largest pretracheal node measuring 2.9 x 2.1 cm. These are nonspecific and most likely reactive, although neoplasm is difficult to exclude. 6. Gross enlargement of the main pulmonary artery measuring up to 4.1 cm in caliber, as can be seen in pulmonary hypertension. 7. Coronary artery disease. Aortic Atherosclerosis (ICD10-I70.0) and Emphysema (ICD10-J43.9). Electronically Signed   By: Eddie Candle M.D.   On: 07/30/2020 15:13   DG Chest Port 1 View  Result Date: 07/31/2020 CLINICAL DATA:  Status post right thoracentesis. EXAM: PORTABLE CHEST 1 VIEW COMPARISON:  07/29/2020 FINDINGS: Unchanged mild cardiomegaly. No significant pulmonary vascular congestion. Interval decrease of right pleural effusion. No pneumothorax is identified. Small left pleural effusion is unchanged. IMPRESSION: No pneumothorax status post right thoracentesis. Electronically Signed   By: Miachel Roux M.D.   On: 07/31/2020 17:00   DG Chest Port 1 View  Result Date: 07/29/2020 CLINICAL DATA:  Altered mental status. EXAM: PORTABLE CHEST 1 VIEW COMPARISON:  July 22, 2020 FINDINGS: Mild, diffusely increased lung markings are seen. Moderate to marked severity right basilar atelectasis and/or infiltrate is seen. This is very mildly decreased in severity when compared to the prior study. Mild left basilar atelectasis and/or infiltrate is also noted. There is a small to moderate size right pleural effusion. Mild to moderate severity enlargement of the cardiac silhouette is seen. There is diffuse calcification of the thoracic aorta. The visualized skeletal structures are unremarkable. IMPRESSION: 1. Stable cardiomegaly with bibasilar atelectasis and/or infiltrate, right greater  than left. 2. Small to moderate sized right pleural effusion. Electronically Signed   By: Virgina Norfolk M.D.   On: 07/29/2020 19:53   DG Chest Port 1 View  Result Date: 07/22/2020 CLINICAL DATA:  Shortness of breath EXAM: PORTABLE CHEST 1 VIEW COMPARISON:  09/29/2019 FINDINGS: Dense opacity at the right base which is considered a combination of pulmonary opacity and pleural fluid. Borderline heart size. Aortic tortuosity. No pneumothorax. Vascular congestion IMPRESSION: 1. Dense opacity at the right base, likely both moderate pleural effusion and pulmonary opacification. 2. Vascular congestion. Electronically Signed   By: Monte Fantasia M.D.   On: 07/22/2020 10:04   ECHOCARDIOGRAM COMPLETE  Result Date: 08/01/2020    ECHOCARDIOGRAM REPORT   Patient Name:   [name] Date of Exam: 08/01/2020 Medical Rec #:  [id number]           Height:       68.0 in Accession #:    [id number]          Weight:  199.1 lb Date of Birth:  1945-04-01           BSA:          2.040 m Patient Age:    30 years            BP:           166/140 mmHg Patient Gender: F                   HR:           68 bpm. Exam Location:  Inpatient Procedure: 2D Echo, Cardiac Doppler and Color Doppler Indications:    Acute respiratory distress R06.03  History:        Patient has prior history of Echocardiogram examinations, most                 recent 09/22/2019. CHF, COPD; Risk Factors:Diabetes, Hypertension                 and Dyslipidemia.  Sonographer:    Bernadene Person RDCS Referring Phys: [place]  1. Left ventricular ejection fraction, by estimation, is 60 to 65%. The left ventricle has normal function. The left ventricle has no regional wall motion abnormalities. Left ventricular diastolic parameters are consistent with Grade I diastolic dysfunction (impaired relaxation). Elevated left ventricular end-diastolic pressure.  2. Right ventricular systolic function is normal. The right ventricular size is  normal. There is moderately elevated pulmonary artery systolic pressure.  3. Left atrial size was mildly dilated.  4. The mitral valve is normal in structure. Trivial mitral valve regurgitation. No evidence of mitral stenosis.  5. The aortic valve is tricuspid. Aortic valve regurgitation is not visualized. Mild aortic valve stenosis. Aortic valve area, by VTI measures 1.14 cm. Aortic valve mean gradient measures 12.0 mmHg. Aortic valve Vmax measures 2.22 m/s.  6. The inferior vena cava is dilated in size with >50% respiratory variability, suggesting right atrial pressure of 8 mmHg. FINDINGS  Left Ventricle: Left ventricular ejection fraction, by estimation, is 60 to 65%. The left ventricle has normal function. The left ventricle has no regional wall motion abnormalities. The left ventricular internal cavity size was normal in size. There is  no left ventricular hypertrophy. Left ventricular diastolic parameters are consistent with Grade I diastolic dysfunction (impaired relaxation). Elevated left ventricular end-diastolic pressure. Right Ventricle: The right ventricular size is normal. No increase in right ventricular wall thickness. Right ventricular systolic function is normal. There is moderately elevated pulmonary artery systolic pressure. The tricuspid regurgitant velocity is 3.19 m/s, and with an assumed right atrial pressure of 8 mmHg, the estimated right ventricular systolic pressure is [id number] mmHg. Left Atrium: Left atrial size was mildly dilated. Right Atrium: Right atrial size was normal in size. Pericardium: There is no evidence of pericardial effusion. Mitral Valve: The mitral valve is normal in structure. Trivial mitral valve regurgitation. No evidence of mitral valve stenosis. Tricuspid Valve: The tricuspid valve is normal in structure. Tricuspid valve regurgitation is trivial. No evidence of tricuspid stenosis. Aortic Valve: The aortic valve is tricuspid. Aortic valve regurgitation is not visualized.  Mild aortic stenosis is present. Aortic valve mean gradient measures 12.0 mmHg. Aortic valve peak gradient measures 19.7 mmHg. Aortic valve area, by VTI measures 1.14 cm. Pulmonic Valve: The pulmonic valve was normal in structure. Pulmonic valve regurgitation is not visualized. No evidence of pulmonic stenosis. Aorta: The aortic root is normal in size and structure. Venous: The inferior vena cava is dilated in  size with greater than 50% respiratory variability, suggesting right atrial pressure of 8 mmHg. IAS/Shunts: No atrial level shunt detected by color flow Doppler.  LEFT VENTRICLE PLAX 2D LVIDd:         4.20 cm  Diastology LVIDs:         2.70 cm  LV e' medial:    5.29 cm/s LV PW:         2.00 cm  LV E/e' medial:  23.1 LV IVS:        1.00 cm  LV e' lateral:   5.36 cm/s LVOT diam:     1.90 cm  LV E/e' lateral: 22.8 LV SV:         65 LV SV Index:   32 LVOT Area:     2.84 cm  RIGHT VENTRICLE RV S prime:     15.30 cm/s TAPSE (M-mode): 1.8 cm LEFT ATRIUM             Index       RIGHT ATRIUM           Index LA diam:        4.10 cm 2.01 cm/m  RA Area:     15.10 cm LA Vol (A2C):   60.3 ml 29.56 ml/m RA Volume:   36.30 ml  17.80 ml/m LA Vol (A4C):   65.2 ml 31.97 ml/m LA Biplane Vol: 64.1 ml 31.43 ml/m  AORTIC VALVE AV Area (Vmax):    1.24 cm AV Area (Vmean):   1.09 cm AV Area (VTI):     1.14 cm AV Vmax:           222.00 cm/s AV Vmean:          171.000 cm/s AV VTI:            0.570 m AV Peak Grad:      19.7 mmHg AV Mean Grad:      12.0 mmHg LVOT Vmax:         96.80 cm/s LVOT Vmean:        66.000 cm/s LVOT VTI:          0.229 m LVOT/AV VTI ratio: 0.40  AORTA Ao Root diam: 3.00 cm MITRAL VALVE                TRICUSPID VALVE MV Area (PHT): 2.62 cm     TR Peak grad:   40.7 mmHg MV Decel Time: 289 msec     TR Vmax:        319.00 cm/s MV E velocity: 122.00 cm/s MV A velocity: 138.00 cm/s  SHUNTS MV E/A ratio:  0.88         Systemic VTI:  0.23 m                             Systemic Diam: 1.90 cm Skeet Latch MD  Electronically signed by Skeet Latch MD Signature Date/Time: 08/01/2020/4:45:29 PM    Final    US Abdomen Limited RUQ (LIVER/GB)  Result Date: 07/31/2020 CLINICAL DATA:  75 year old female with history of alcoholism and concern for cirrhosis. EXAM: ULTRASOUND ABDOMEN LIMITED RIGHT UPPER QUADRANT COMPARISON:  CT abdomen pelvis dated 08/24/2017. FINDINGS: Gallbladder: The gallbladder is not visualized, likely contracted or surgically absent. Clinical correlation is recommended. Common bile duct: Diameter: 6 mm Liver: The liver demonstrates a slightly increased echogenicity with mildly coarsened echotexture which may represent mild fatty infiltration. Early fibrosis is not excluded. Elastography may  provide better evaluation. Portal vein is patent on color Doppler imaging with normal direction of blood flow towards the liver. Other: None. IMPRESSION: Probable mild fatty liver and findings concerning for early fibrosis. Elastography may provide better evaluation if clinically indicated. Electronically Signed   By: Anner Crete M.D.   On: 07/31/2020 16:56   US THORACENTESIS ASP PLEURAL SPACE W/IMG GUIDE  Result Date: 07/31/2020 INDICATION: Shortness of breath. Right-sided pleural effusion. Request for diagnostic and therapeutic thoracentesis. EXAM: ULTRASOUND GUIDED RIGHT THORACENTESIS MEDICATIONS: 1% plain lidocaine, 7 mL COMPLICATIONS: None immediate. PROCEDURE: An ultrasound guided thoracentesis was thoroughly discussed with the patient and questions answered. The benefits, risks, alternatives and complications were also discussed. The patient understands and wishes to proceed with the procedure. Written consent was obtained. Ultrasound was performed to localize and mark an adequate pocket of fluid in the right chest. The area was then prepped and draped in the normal sterile fashion. 1% Lidocaine was used for local anesthesia. Under ultrasound guidance a 6 Fr Safe-T-Centesis catheter was introduced.  Thoracentesis was performed. The catheter was removed and a dressing applied. FINDINGS: A total of approximately 825 mL of clear yellow fluid was removed. Samples were sent to the laboratory as requested by the clinical team. IMPRESSION: Successful ultrasound guided right thoracentesis yielding 825 mL of pleural fluid. Read by: Ascencion Dike PA-C Electronically Signed   By: Miachel Roux M.D.   On: 07/31/2020 15:33     Assessment & Plan:   No problem-specific Assessment & Plan notes found for this encounter.     Martyn Ehrich, NP 08/13/2020

## 2020-08-14 ENCOUNTER — Other Ambulatory Visit: Payer: Self-pay | Admitting: *Deleted

## 2020-08-14 NOTE — Patient Outreach (Signed)
[facility]) Care Management  08/14/2020  [name] [birth year] [id number]   Case discussed with MCD team due to 30 day readmission.  Advised that outreach attempts have been unsuccessful, 4th attempt scheduled for 9/2.  Will advise assigned RNCM to contact PCP office to confirm contact information. Will also advise to contact member's brother as he is listed as next of kin.  [name], [place], MSN [place] [phone]

## 2020-08-18 DIAGNOSIS — J9621 Acute and chronic respiratory failure with hypoxia: Secondary | ICD-10-CM | POA: Diagnosis not present

## 2020-08-18 DIAGNOSIS — I5033 Acute on chronic diastolic (congestive) heart failure: Secondary | ICD-10-CM | POA: Diagnosis not present

## 2020-08-18 DIAGNOSIS — J9622 Acute and chronic respiratory failure with hypercapnia: Secondary | ICD-10-CM | POA: Diagnosis not present

## 2020-08-18 DIAGNOSIS — I110 Hypertensive heart disease with heart failure: Secondary | ICD-10-CM | POA: Diagnosis not present

## 2020-08-18 DIAGNOSIS — J9601 Acute respiratory failure with hypoxia: Secondary | ICD-10-CM | POA: Diagnosis not present

## 2020-08-18 DIAGNOSIS — E1169 Type 2 diabetes mellitus with other specified complication: Secondary | ICD-10-CM | POA: Diagnosis not present

## 2020-08-18 DIAGNOSIS — J189 Pneumonia, unspecified organism: Secondary | ICD-10-CM | POA: Diagnosis not present

## 2020-08-18 DIAGNOSIS — E785 Hyperlipidemia, unspecified: Secondary | ICD-10-CM | POA: Diagnosis not present

## 2020-08-18 DIAGNOSIS — E440 Moderate protein-calorie malnutrition: Secondary | ICD-10-CM | POA: Diagnosis not present

## 2020-08-18 DIAGNOSIS — J440 Chronic obstructive pulmonary disease with acute lower respiratory infection: Secondary | ICD-10-CM | POA: Diagnosis not present

## 2020-08-18 DIAGNOSIS — J441 Chronic obstructive pulmonary disease with (acute) exacerbation: Secondary | ICD-10-CM | POA: Diagnosis not present

## 2020-08-19 DIAGNOSIS — J189 Pneumonia, unspecified organism: Secondary | ICD-10-CM | POA: Diagnosis not present

## 2020-08-19 DIAGNOSIS — J440 Chronic obstructive pulmonary disease with acute lower respiratory infection: Secondary | ICD-10-CM | POA: Diagnosis not present

## 2020-08-19 DIAGNOSIS — J9621 Acute and chronic respiratory failure with hypoxia: Secondary | ICD-10-CM | POA: Diagnosis not present

## 2020-08-19 DIAGNOSIS — J9622 Acute and chronic respiratory failure with hypercapnia: Secondary | ICD-10-CM | POA: Diagnosis not present

## 2020-08-19 DIAGNOSIS — I5033 Acute on chronic diastolic (congestive) heart failure: Secondary | ICD-10-CM | POA: Diagnosis not present

## 2020-08-19 DIAGNOSIS — J441 Chronic obstructive pulmonary disease with (acute) exacerbation: Secondary | ICD-10-CM | POA: Diagnosis not present

## 2020-08-19 DIAGNOSIS — E1169 Type 2 diabetes mellitus with other specified complication: Secondary | ICD-10-CM | POA: Diagnosis not present

## 2020-08-19 DIAGNOSIS — I110 Hypertensive heart disease with heart failure: Secondary | ICD-10-CM | POA: Diagnosis not present

## 2020-08-19 DIAGNOSIS — E440 Moderate protein-calorie malnutrition: Secondary | ICD-10-CM | POA: Diagnosis not present

## 2020-08-19 DIAGNOSIS — E785 Hyperlipidemia, unspecified: Secondary | ICD-10-CM | POA: Diagnosis not present

## 2020-08-20 ENCOUNTER — Telehealth: Payer: Self-pay

## 2020-08-20 NOTE — Telephone Encounter (Signed)
(  1:30 pm) SW scheduled initial SW/RN palliative visit with patient via her brother/PCG-[name]. The visit is scheduled for 09/12/20 '@11'$  am.

## 2020-08-21 DIAGNOSIS — E785 Hyperlipidemia, unspecified: Secondary | ICD-10-CM | POA: Diagnosis not present

## 2020-08-21 DIAGNOSIS — J189 Pneumonia, unspecified organism: Secondary | ICD-10-CM | POA: Diagnosis not present

## 2020-08-21 DIAGNOSIS — E440 Moderate protein-calorie malnutrition: Secondary | ICD-10-CM | POA: Diagnosis not present

## 2020-08-21 DIAGNOSIS — I110 Hypertensive heart disease with heart failure: Secondary | ICD-10-CM | POA: Diagnosis not present

## 2020-08-21 DIAGNOSIS — J440 Chronic obstructive pulmonary disease with acute lower respiratory infection: Secondary | ICD-10-CM | POA: Diagnosis not present

## 2020-08-21 DIAGNOSIS — J9621 Acute and chronic respiratory failure with hypoxia: Secondary | ICD-10-CM | POA: Diagnosis not present

## 2020-08-21 DIAGNOSIS — J9622 Acute and chronic respiratory failure with hypercapnia: Secondary | ICD-10-CM | POA: Diagnosis not present

## 2020-08-21 DIAGNOSIS — I5033 Acute on chronic diastolic (congestive) heart failure: Secondary | ICD-10-CM | POA: Diagnosis not present

## 2020-08-21 DIAGNOSIS — J441 Chronic obstructive pulmonary disease with (acute) exacerbation: Secondary | ICD-10-CM | POA: Diagnosis not present

## 2020-08-21 DIAGNOSIS — E1169 Type 2 diabetes mellitus with other specified complication: Secondary | ICD-10-CM | POA: Diagnosis not present

## 2020-08-24 DIAGNOSIS — Z743 Need for continuous supervision: Secondary | ICD-10-CM | POA: Diagnosis not present

## 2020-09-01 DIAGNOSIS — J449 Chronic obstructive pulmonary disease, unspecified: Secondary | ICD-10-CM | POA: Diagnosis not present

## 2020-09-03 LAB — FUNGUS CULTURE WITH STAIN

## 2020-09-03 LAB — FUNGUS CULTURE RESULT

## 2020-09-03 LAB — FUNGAL ORGANISM REFLEX

## 2020-09-04 DIAGNOSIS — 419620001 Death: Secondary | SNOMED CT | POA: Diagnosis not present

## 2020-09-04 DEATH — deceased

## 2020-09-05 ENCOUNTER — Other Ambulatory Visit: Payer: Self-pay | Admitting: *Deleted

## 2020-09-05 NOTE — Patient Outreach (Addendum)
[facility]) Care Management  09/05/2020  [name] 11/08/1945 [id number]   Telephone Assessment-Unsuccessful  Outreach #4  RN attempted to contact pt through there requested family her brother [name] [phone] however only able to leave a HIPAA approved voice message requesting a call back.  Will re-attempt another outreach call at the requested contact number over the next  2 weeks for pending services however several calls have taken place to this pt.  Raina Mina, RN Care Management Coordinator [place] Office [phone] -

## 2020-09-12 ENCOUNTER — Other Ambulatory Visit: Payer: Medicare HMO

## 2020-09-12 ENCOUNTER — Other Ambulatory Visit: Payer: Medicare HMO | Admitting: *Deleted

## 2020-09-12 ENCOUNTER — Other Ambulatory Visit: Payer: Self-pay

## 2020-09-12 DIAGNOSIS — Z515 Encounter for palliative care: Secondary | ICD-10-CM

## 2020-09-12 NOTE — Progress Notes (Signed)
Arrived at patient's home for a scheduled visit with Katheren Puller, SW.  No answer at the door.  Palliative Care folder left at patient's door.

## 2020-09-12 NOTE — Progress Notes (Signed)
SOCIAL WORK MISSED VISIT SW and RN-J. Owens Shark arrived at patient's home for a scheduled visit. There was no answer at the door. SW left a palliative folder with a business card in the door in an effort for them to call back to reschedule.

## 2020-09-15 ENCOUNTER — Other Ambulatory Visit: Payer: Self-pay

## 2020-09-17 ENCOUNTER — Other Ambulatory Visit: Payer: Self-pay | Admitting: *Deleted

## 2020-09-17 LAB — ACID FAST CULTURE WITH REFLEXED SENSITIVITIES (MYCOBACTERIA): Acid Fast Culture: NEGATIVE

## 2020-09-17 NOTE — Patient Outreach (Signed)
[facility]) Care Management  09/17/2020  [name] 08/12/1945 [id number]   Closed (Expired)  Spoke with son Gordan [facility]) who reports pt expired on 8/21. Offered to call other involved services with this information however son indicated he has the numbers and will contact them personally.  Raina Mina, RN Care Management Coordinator [place] Office [phone]

## 2020-10-07 ENCOUNTER — Ambulatory Visit: Payer: Medicare HMO | Admitting: Cardiovascular Disease

## 2020-12-01 IMAGING — CT CT MAXILLOFACIAL W/O CM
3 series · 15 of 47 positions shown, 18 images · non-contrast
Comparison: None.

CLINICAL DATA: Facial trauma.

EXAM:
CT HEAD WITHOUT CONTRAST
CT MAXILLOFACIAL WITHOUT CONTRAST
TECHNIQUE: Multidetector CT imaging of the head and maxillofacial structures
were performed using the standard protocol without intravenous
contrast. Multiplanar CT image reconstructions of the maxillofacial
structures were also generated.

[Series 4: facialbone 2.0 (person_name) (person_name) · axial · 0.37mm/px · z∈[-226,-68]mm · 9 of 93 slices shown, 12 images]
[im 7/93  brain]
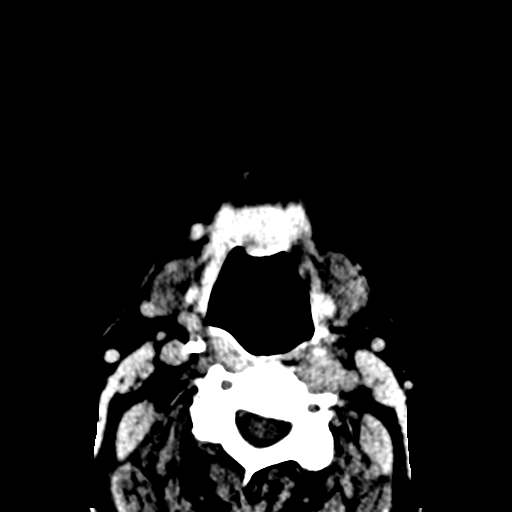
[im 7/93  bone]
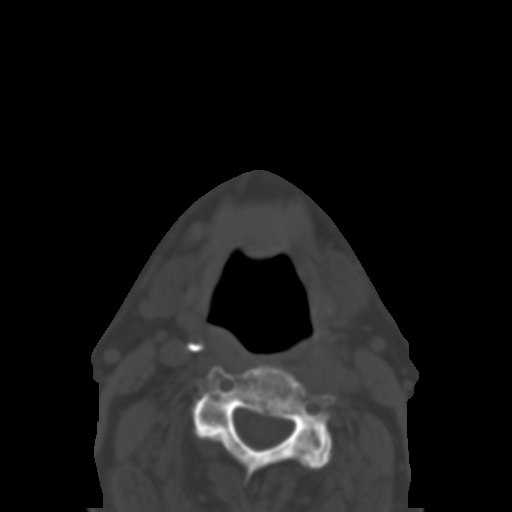
[im 16/93  bone]
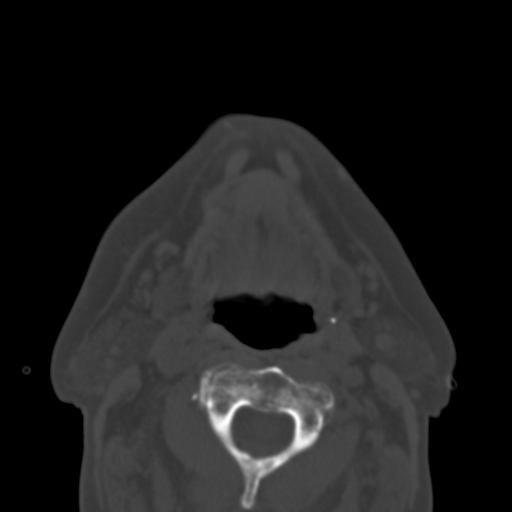
[im 26/93  bone]
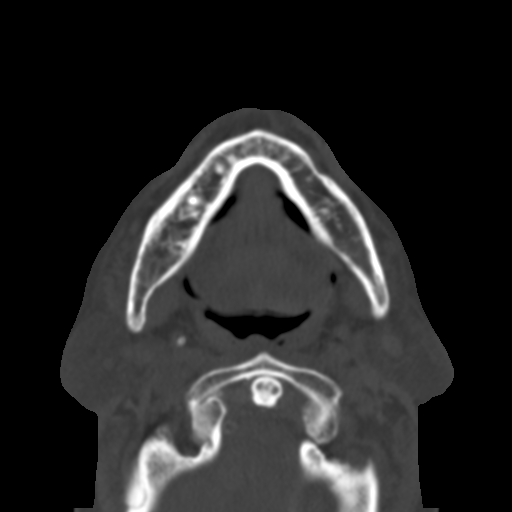
[im 35/93  bone]
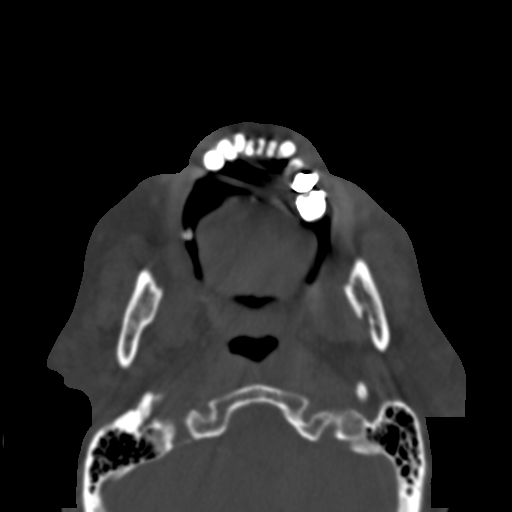
[im 48/93  brain]
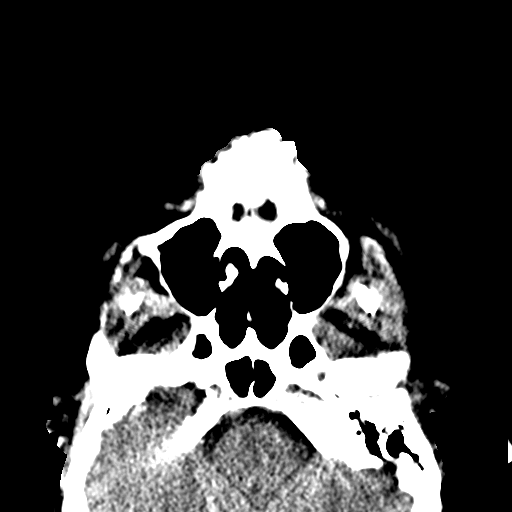
[im 48/93  bone]
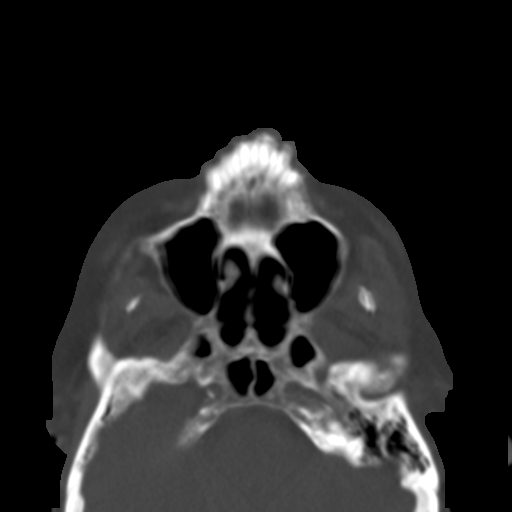
[im 58/93  bone]
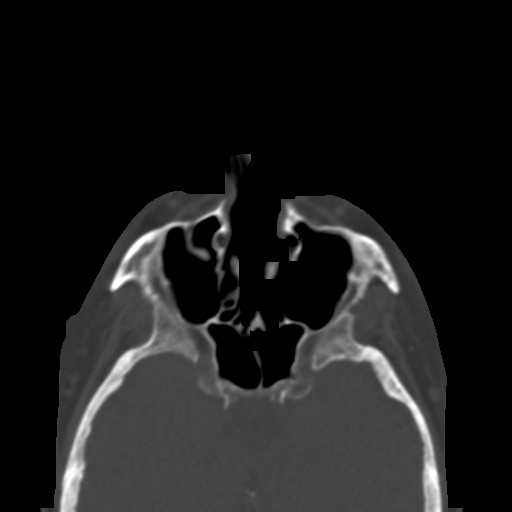
[im 67/93  bone]
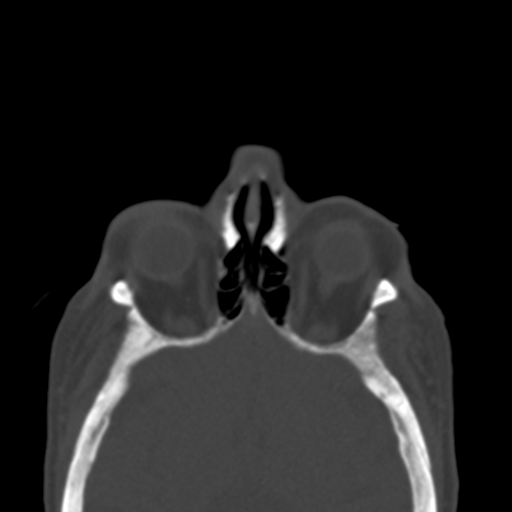
[im 77/93  bone]
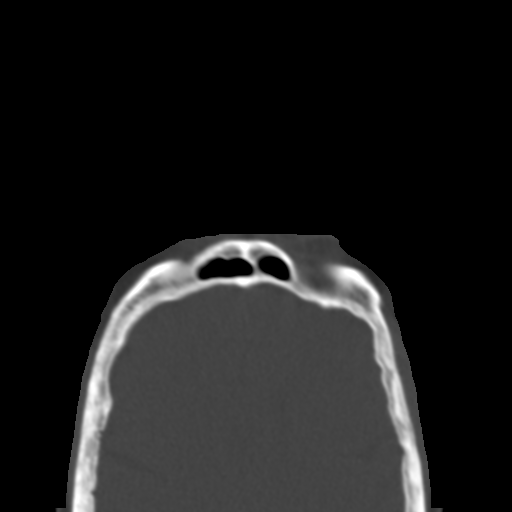
[im 86/93  brain]
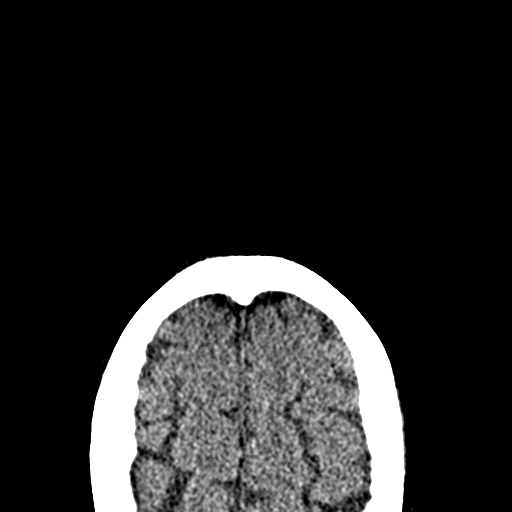
[im 86/93  bone]
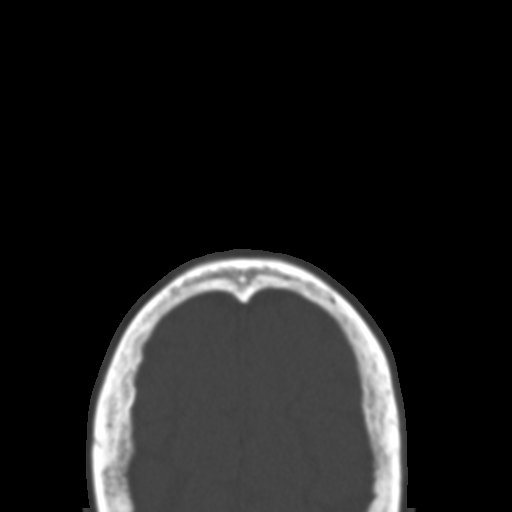

[Series 9: facialbone 2.0 cor st · coronal · 0.40mm/px · 3 of 80 slices shown]
[im 27/80  bone]
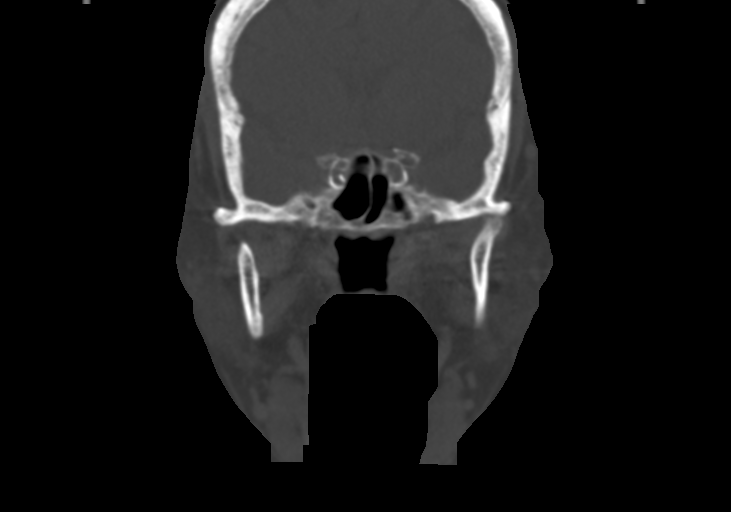
[im 36/80  bone]
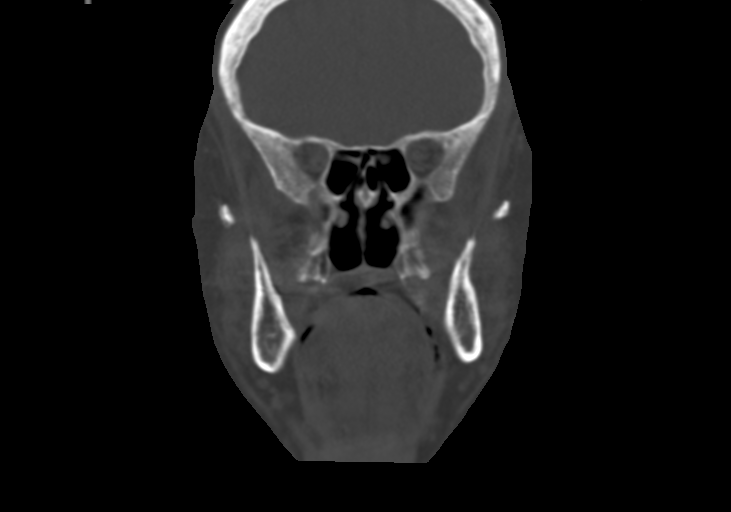
[im 44/80  bone]
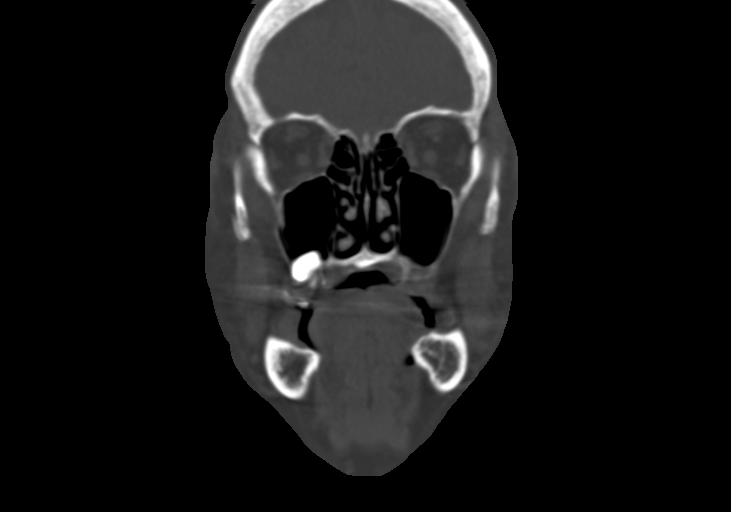

[Series 10: facialbone 2.0 sag st · sagittal · 0.35mm/px · 3 of 82 slices shown]
[im 28/82  bone]
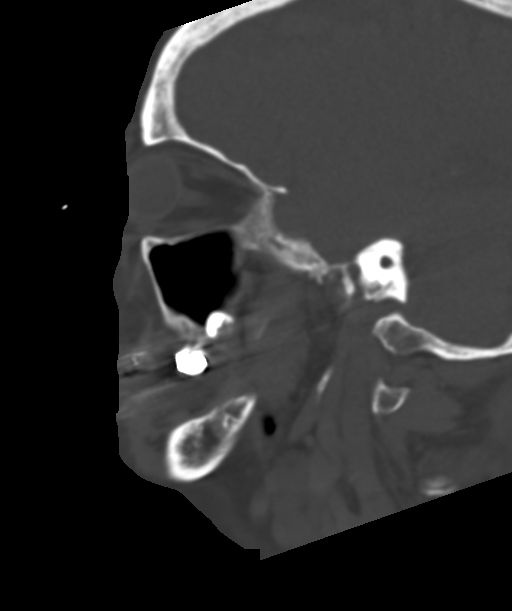
[im 41/82  bone]
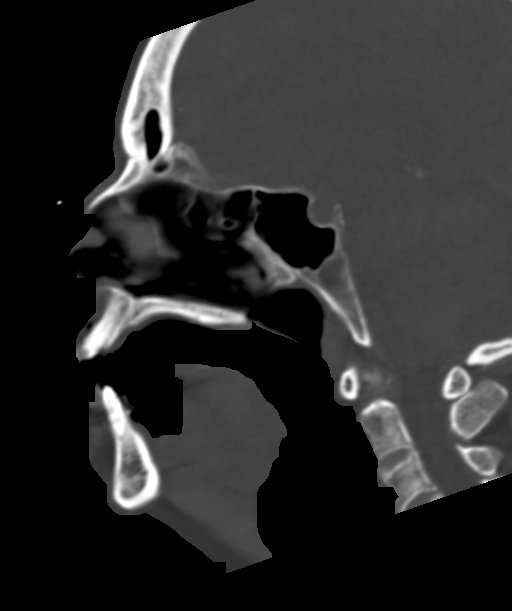
[im 55/82  bone]
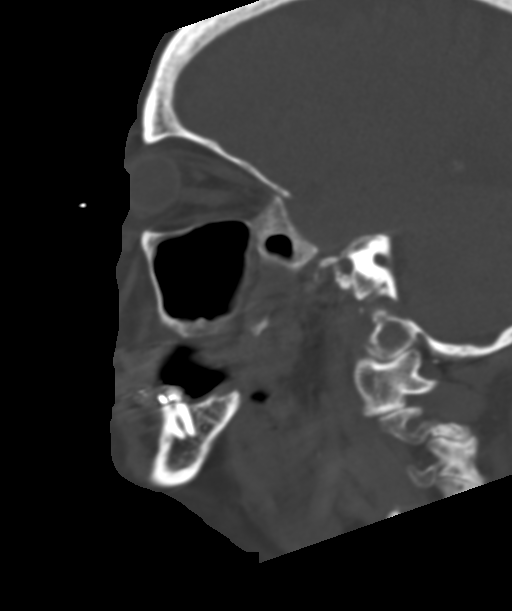

[15 of 47 positions shown; findings below may reference images not displayed]

FINDINGS: CT HEAD FINDINGS

Brain: No evidence of acute infarction, hemorrhage, hydrocephalus,
extra-axial collection or mass lesion/mass effect. Large,
extra-axial CSF density structure within the right posterior cranial
fossa is identified measuring 6.3 x 3.1 cm. Findings compatible with
a arachnoid cyst.

Vascular: No hyperdense vessel or unexpected calcification.

Skull: Normal. Negative for fracture or focal lesion.

Other: None.

CT MAXILLOFACIAL FINDINGS

Osseous: No fracture or mandibular dislocation. No destructive
process.

Orbits: Negative. No traumatic or inflammatory finding.

Sinuses: The paranasal sinuses are clear.

Soft tissues: Negative.
IMPRESSION: 1. No acute intracranial abnormalities.
2. Large right posterior cranial fossa arachnoid cyst.
3. No evidence for facial bone fracture.

## 2021-10-01 IMAGING — DX DG CHEST 1V PORT
1 series · 1 of 1 positions shown · non-contrast
Comparison: 09/29/2019

CLINICAL DATA: Shortness of breath

EXAM:
PORTABLE CHEST 1 VIEW

[chest ap]
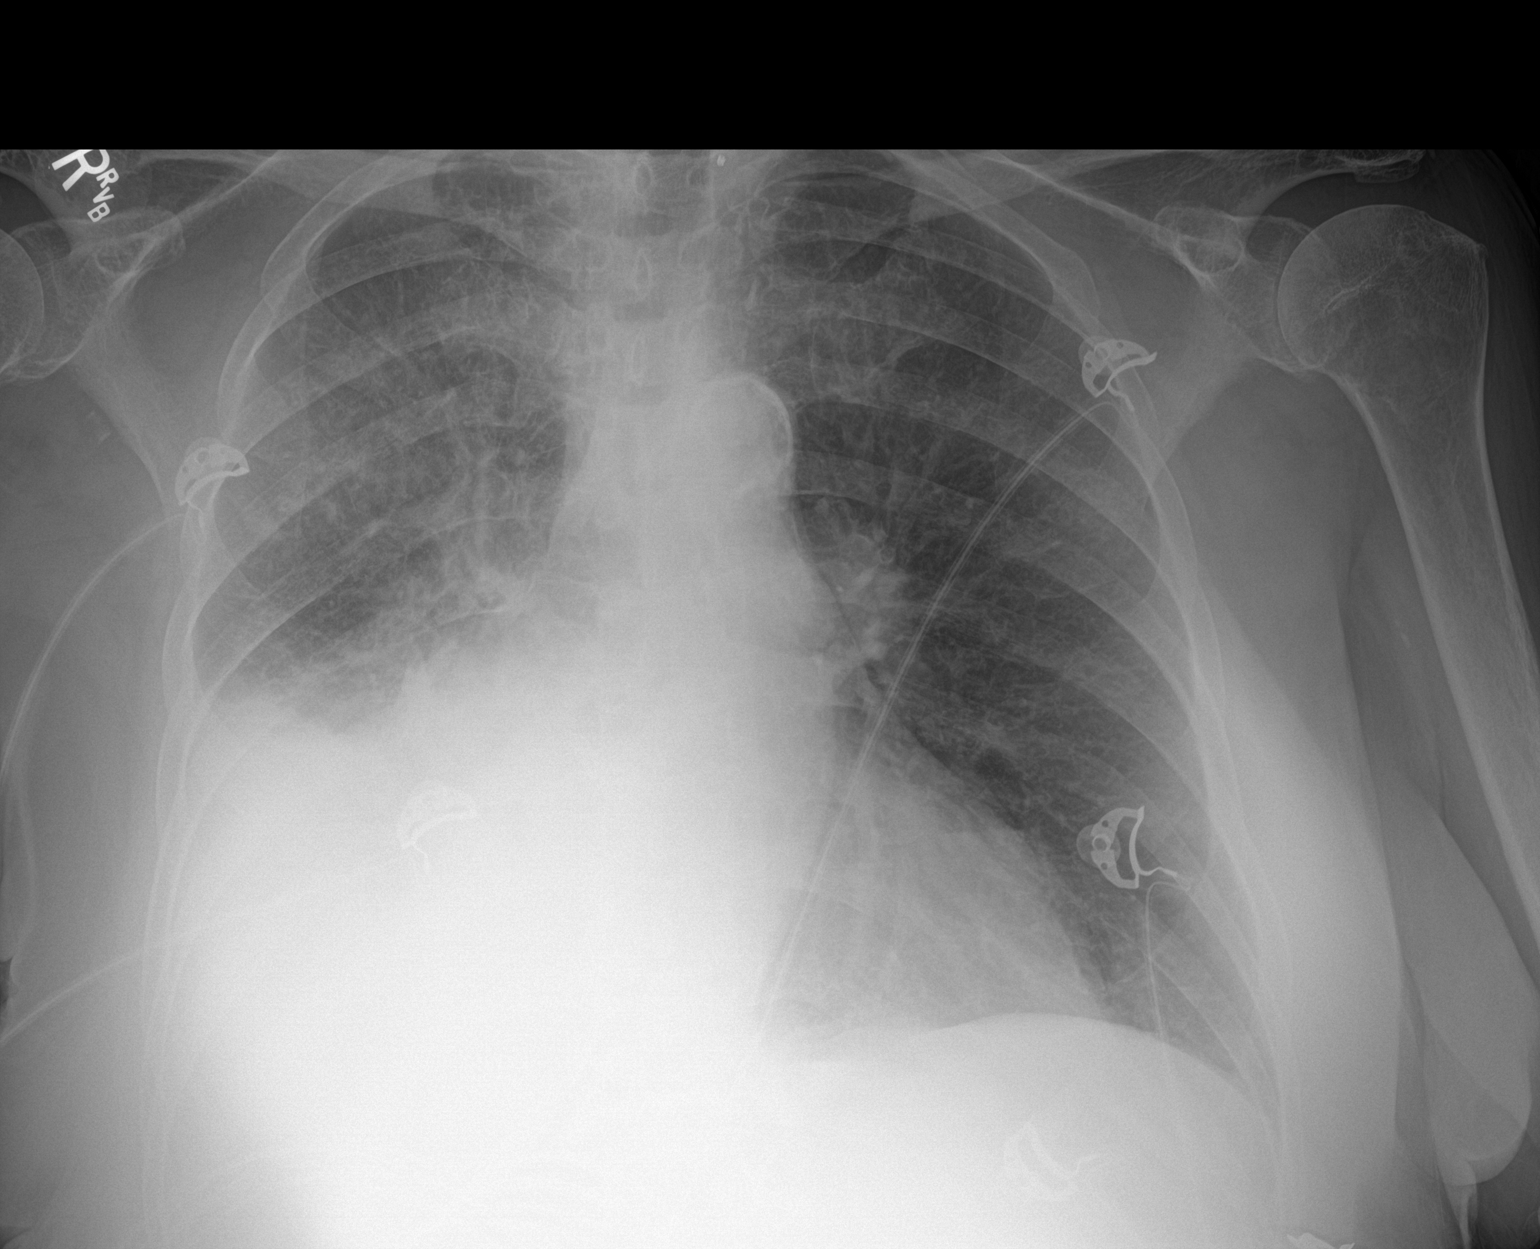

[1 of 1 positions shown; findings below may reference images not displayed]

FINDINGS: Dense opacity at the right base which is considered a combination of
pulmonary opacity and pleural fluid. Borderline heart size. Aortic
tortuosity. No pneumothorax. Vascular congestion
IMPRESSION: 1. Dense opacity at the right base, likely both moderate pleural
effusion and pulmonary opacification.
2. Vascular congestion.
